# Patient Record
Sex: Male | Born: 1935 | Race: White | Hispanic: No | Smoking: Never smoker
Health system: Southern US, Community
[De-identification: ages and names within clinical notes are randomized; demographics above are authoritative.]

## PROBLEM LIST (undated history)

## (undated) DIAGNOSIS — H3320 Serous retinal detachment, unspecified eye: Secondary | ICD-10-CM

## (undated) DIAGNOSIS — I10 Essential (primary) hypertension: Secondary | ICD-10-CM

## (undated) DIAGNOSIS — K635 Polyp of colon: Secondary | ICD-10-CM

## (undated) DIAGNOSIS — E119 Type 2 diabetes mellitus without complications: Secondary | ICD-10-CM

## (undated) DIAGNOSIS — E785 Hyperlipidemia, unspecified: Secondary | ICD-10-CM

## (undated) DIAGNOSIS — C801 Malignant (primary) neoplasm, unspecified: Secondary | ICD-10-CM

## (undated) DIAGNOSIS — R011 Cardiac murmur, unspecified: Secondary | ICD-10-CM

## (undated) DIAGNOSIS — N189 Chronic kidney disease, unspecified: Secondary | ICD-10-CM

## (undated) HISTORY — DX: Essential (primary) hypertension: I10

## (undated) HISTORY — DX: Serous retinal detachment, unspecified eye: H33.20

## (undated) HISTORY — PX: OTHER SURGICAL HISTORY: SHX169

## (undated) HISTORY — DX: Hyperlipidemia, unspecified: E78.5

## (undated) HISTORY — DX: Cardiac murmur, unspecified: R01.1

## (undated) HISTORY — DX: Polyp of colon: K63.5

## (undated) HISTORY — PX: HERNIA REPAIR: SHX51

## (undated) HISTORY — PX: COLONOSCOPY: SHX174

## (undated) HISTORY — PX: EYE SURGERY: SHX253

---

## 1978-06-10 DIAGNOSIS — I10 Essential (primary) hypertension: Secondary | ICD-10-CM

## 1978-06-10 HISTORY — DX: Essential (primary) hypertension: I10

## 2002-10-22 ENCOUNTER — Encounter: Admission: RE | Admit: 2002-10-22 | Discharge: 2002-10-22 | Payer: Self-pay | Admitting: Nephrology

## 2002-10-22 ENCOUNTER — Encounter: Payer: Self-pay | Admitting: Nephrology

## 2005-02-21 ENCOUNTER — Other Ambulatory Visit: Payer: Self-pay

## 2005-02-21 ENCOUNTER — Ambulatory Visit: Payer: Self-pay | Admitting: General Surgery

## 2005-02-28 ENCOUNTER — Ambulatory Visit: Payer: Self-pay | Admitting: General Surgery

## 2006-06-10 DIAGNOSIS — R011 Cardiac murmur, unspecified: Secondary | ICD-10-CM

## 2006-06-10 HISTORY — DX: Cardiac murmur, unspecified: R01.1

## 2008-01-11 ENCOUNTER — Other Ambulatory Visit: Payer: Self-pay

## 2008-01-11 ENCOUNTER — Inpatient Hospital Stay: Payer: Self-pay | Admitting: Internal Medicine

## 2010-04-24 ENCOUNTER — Ambulatory Visit: Payer: Self-pay | Admitting: General Surgery

## 2010-06-01 ENCOUNTER — Ambulatory Visit: Payer: Self-pay | Admitting: General Surgery

## 2010-06-05 LAB — PATHOLOGY REPORT

## 2011-06-28 ENCOUNTER — Ambulatory Visit: Payer: Self-pay | Admitting: General Surgery

## 2011-06-28 HISTORY — PX: COLONOSCOPY: SHX174

## 2011-07-01 LAB — PATHOLOGY REPORT

## 2012-12-08 ENCOUNTER — Encounter: Payer: Self-pay | Admitting: *Deleted

## 2013-06-17 ENCOUNTER — Ambulatory Visit (INDEPENDENT_AMBULATORY_CARE_PROVIDER_SITE_OTHER): Payer: Medicare Other | Admitting: General Surgery

## 2013-06-17 ENCOUNTER — Encounter: Payer: Self-pay | Admitting: General Surgery

## 2013-06-17 VITALS — BP 132/68 | HR 60 | Resp 12 | Ht 70.0 in | Wt 154.0 lb

## 2013-06-17 DIAGNOSIS — K635 Polyp of colon: Secondary | ICD-10-CM

## 2013-06-17 DIAGNOSIS — D126 Benign neoplasm of colon, unspecified: Secondary | ICD-10-CM

## 2013-06-17 MED ORDER — POLYETHYLENE GLYCOL 3350 17 GM/SCOOP PO POWD: ORAL | Status: DC

## 2013-06-17 NOTE — Progress Notes (Signed)
Patient ID: [name], male   DOB: 1931-06-17, 80 y.o.   MRN: [id number]  Chief Complaint  Patient presents with  . Colon Polyps    HPI [name] is a 80 y.o. male.  Here to discuss having a 2 year follow up colonoscopy. Multiple polyps were identified at that time. Denies any gastrointestinal issues. 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 States he has a little heart/chest pain occasionally when he gets frustrated while caring for his wife, but has no activity limitations.The patient has not experienced any change in his bowel habits. Bowels move about every other day.  HPI  Past Medical History  Diagnosis Date  . Hypertension 1980  . Heart murmur 2008  . Retinal detachment   . Hyperlipidemia     Past Surgical History  Procedure Laterality Date  . Hernia repair    . Eye surgery    . Colonoscopy  2011  . Colonoscopy  06-28-2011    No family history on file.  Social History History  Substance Use Topics  . Smoking status: Never Smoker   . Smokeless tobacco: Never Used  . Alcohol Use: No    Allergies  Allergen Reactions  . Penicillins Rash    Current Outpatient Prescriptions  Medication Sig Dispense Refill  . amLODipine (NORVASC) 10 MG tablet Take 10 mg by mouth daily.       Marland Kitchen aspirin 81 MG tablet Take 81 mg by mouth daily.      Marland Kitchen atorvastatin (LIPITOR) 20 MG tablet Take 20 mg by mouth daily at 6 PM.       . cloNIDine (CATAPRES - DOSED IN MG/24 HR) 0.2 mg/24hr patch Place 0.2 mg onto the skin once a week.       Marland Kitchen GLIPIZIDE XL 2.5 MG 24 hr tablet Take 2.5 mg by mouth daily with breakfast.       . JALYN 0.5-0.4 MG CAPS daily.       Marland Kitchen labetalol (NORMODYNE) 200 MG tablet Take 100 mg by mouth daily.       . Multiple Vitamin (MULTIVITAMIN) capsule Take 1 capsule by mouth daily.      . polyethylene glycol powder (GLYCOLAX/MIRALAX) powder 255 grams one bottle for colonoscopy prep  255 g  0   No current  facility-administered medications for this visit.    Review of Systems Review of Systems  Constitutional: Negative.   Respiratory: Negative.   Cardiovascular: Negative.     Blood pressure 132/68, pulse 60, resp. rate 12, height 5\' 10"  (1.778 m), weight 154 lb (69.854 kg).  Physical Exam Physical Exam  Constitutional: He is oriented to person, place, and time. He appears well-developed and well-nourished.  Cardiovascular: Normal rate, regular rhythm and normal heart sounds.   No lower leg edema.  Pulmonary/Chest: Effort normal and breath sounds normal.  Abdominal: Soft. Normal appearance and bowel sounds are normal. There is no tenderness. No hernia. Hernia confirmed negative in the right inguinal area and confirmed negative in the left inguinal area.  Lymphadenopathy:    He has no cervical adenopathy.  Neurological: He is alert and oriented to person, place, and time.  Skin: Skin is warm and dry.  Bilateral lower leg venous stasis skin changes.    Data Reviewed Pathology from his 06/28/2011 colonoscopy showed a tubular adenoma in the sigmoid colon at 30 cm, tubular adenoma in the transverse colon and to adenoma in the proximal  transverse colon. The sigmoid lesion required fulguration at the base as did the transverse colon lesion. He previously had a tubulovillous adenoma removed from the hepatic flexure measuring 30 mm in diameter at the time of a 06/01/2010 exam.  Assessment    Colonic polyps.    Plan    Colonoscopy with possible biopsy/polypectomy prn: Information regarding the procedure, including its potential risks and complications (including but not limited to perforation of the bowel, which may require emergency surgery to repair, and bleeding) was verbally given to the patient. Educational information regarding lower instestinal endoscopy was given to the patient. Written instructions for how to complete the bowel prep using Miralax were provided. The importance of  drinking ample fluids to avoid dehydration as a result of the prep emphasized.     Patient has been scheduled for a colonoscopy on 06-23-13 at [facility].  [name] 06/18/2013, 3:06 PM

## 2013-06-17 NOTE — Patient Instructions (Addendum)
Colonoscopy A colonoscopy is an exam to evaluate your entire colon. In this exam, your colon is cleansed. A long fiberoptic tube is inserted through your rectum and into your colon. The fiberoptic scope (endoscope) is a long bundle of enclosed and very flexible fibers. These fibers transmit light to the area examined and send images from that area to your caregiver. Discomfort is usually minimal. You may be given a drug to help you sleep (sedative) during or prior to the procedure. This exam helps to detect lumps (tumors), polyps, inflammation, and areas of bleeding. Your caregiver may also take a small piece of tissue (biopsy) that will be examined under a microscope. LET YOUR CAREGIVER KNOW ABOUT:   Allergies to food or medicine.  Medicines taken, including vitamins, herbs, eyedrops, over-the-counter medicines, and creams.  Use of steroids (by mouth or creams).  Previous problems with anesthetics or numbing medicines.  History of bleeding problems or blood clots.  Previous surgery.  Other health problems, including diabetes and kidney problems.  Possibility of pregnancy, if this applies. BEFORE THE PROCEDURE   A clear liquid diet may be required for 2 days before the exam.  Ask your caregiver about changing or stopping your regular medications.  Liquid injections (enemas) or laxatives may be required.  A large amount of electrolyte solution may be given to you to drink over a short period of time. This solution is used to clean out your colon.  You should be present 60 minutes prior to your procedure or as directed by your caregiver. AFTER THE PROCEDURE   If you received a sedative or pain relieving medication, you will need to arrange for someone to drive you home.  Occasionally, there is a little blood passed with the first bowel movement. Do not be concerned. FINDING OUT THE RESULTS OF YOUR TEST Not all test results are available during your visit. If your test results are  not back during the visit, make an appointment with your caregiver to find out the results. Do not assume everything is normal if you have not heard from your caregiver or the medical facility. It is important for you to follow up on all of your test results. HOME CARE INSTRUCTIONS   It is not unusual to pass moderate amounts of gas and experience mild abdominal cramping following the procedure. This is due to air being used to inflate your colon during the exam. Walking or a warm pack on your belly (abdomen) may help.  You may resume all normal meals and activities after sedatives and medicines have worn off.  Only take over-the-counter or prescription medicines for pain, discomfort, or fever as directed by your caregiver. Do not use aspirin or blood thinners if a biopsy was taken. Consult your caregiver for medicine usage if biopsies were taken. SEEK IMMEDIATE MEDICAL CARE IF:   You have a fever.  You pass large blood clots or fill a toilet with blood following the procedure. This may also occur 10 to 14 days following the procedure. This is more likely if a biopsy was taken.  You develop abdominal pain that keeps getting worse and cannot be relieved with medicine. Document Released: 05/24/2000 Document Revised: 08/19/2011 Document Reviewed: 12/28/2012 [facility] Patient Information 2014 [place].  Patient has been scheduled for a colonoscopy on 06-23-13 at [facility].

## 2013-06-18 ENCOUNTER — Other Ambulatory Visit: Payer: Self-pay | Admitting: General Surgery

## 2013-06-18 DIAGNOSIS — K635 Polyp of colon: Secondary | ICD-10-CM

## 2013-06-21 ENCOUNTER — Telehealth: Payer: Self-pay | Admitting: *Deleted

## 2013-06-21 NOTE — Telephone Encounter (Signed)
Patient notified as instructed to hold glipizide day of colonoscopy prep and procedure. He verbalizes understanding.  This patient is currently scheduled for a colonoscopy on 06-23-13 at [facility]

## 2013-06-23 ENCOUNTER — Ambulatory Visit: Payer: Self-pay | Admitting: General Surgery

## 2013-06-23 DIAGNOSIS — D129 Benign neoplasm of anus and anal canal: Secondary | ICD-10-CM

## 2013-06-23 DIAGNOSIS — D128 Benign neoplasm of rectum: Secondary | ICD-10-CM

## 2013-06-23 HISTORY — PX: POLYPECTOMY: SHX149

## 2013-06-25 ENCOUNTER — Encounter: Payer: Self-pay | Admitting: General Surgery

## 2013-06-25 LAB — PATHOLOGY REPORT

## 2013-06-28 ENCOUNTER — Encounter: Payer: Self-pay | Admitting: General Surgery

## 2013-06-28 ENCOUNTER — Ambulatory Visit (INDEPENDENT_AMBULATORY_CARE_PROVIDER_SITE_OTHER): Payer: Self-pay | Admitting: General Surgery

## 2013-06-28 VITALS — BP 126/64 | HR 68 | Resp 12 | Ht 70.0 in | Wt 154.0 lb

## 2013-06-28 DIAGNOSIS — D126 Benign neoplasm of colon, unspecified: Secondary | ICD-10-CM

## 2013-06-28 DIAGNOSIS — K635 Polyp of colon: Secondary | ICD-10-CM

## 2013-06-28 NOTE — Progress Notes (Signed)
Patient ID: [name], male   DOB: [birth year], 80 y.o.   MRN: [id number]  Chief Complaint  Patient presents with  . Routine Post Op    colonoscopy    HPI [name] is a 80 y.o. male here today for his post op colonoscopy which was done on 06/23/13. Patient states he is doing well . The procedure was completed due to prior incomplete resections of a polyp on the inner curve of the hepatic flexure. The patient tolerated the procedure well. HPI  Past Medical History  Diagnosis Date  . Hypertension 1980  . Heart murmur 2008  . Retinal detachment   . Hyperlipidemia     Past Surgical History  Procedure Laterality Date  . Hernia repair    . Eye surgery    . Colonoscopy  2011,06/23/13  . Colonoscopy  06-28-2011    No family history on file.  Social History History  Substance Use Topics  . Smoking status: Never Smoker   . Smokeless tobacco: Never Used  . Alcohol Use: No    Allergies  Allergen Reactions  . Penicillins Rash    Current Outpatient Prescriptions  Medication Sig Dispense Refill  . amLODipine (NORVASC) 10 MG tablet Take 10 mg by mouth daily.       Marland Kitchen aspirin 81 MG tablet Take 81 mg by mouth daily.      Marland Kitchen atorvastatin (LIPITOR) 20 MG tablet Take 20 mg by mouth daily at 6 PM.       . cloNIDine (CATAPRES - DOSED IN MG/24 HR) 0.2 mg/24hr patch Place 0.2 mg onto the skin once a week.       Marland Kitchen GLIPIZIDE XL 2.5 MG 24 hr tablet Take 2.5 mg by mouth daily with breakfast.       . JALYN 0.5-0.4 MG CAPS daily.       Marland Kitchen labetalol (NORMODYNE) 200 MG tablet Take 100 mg by mouth daily.       . Multiple Vitamin (MULTIVITAMIN) capsule Take 1 capsule by mouth daily.      . polyethylene glycol powder (GLYCOLAX/MIRALAX) powder 255 grams one bottle for colonoscopy prep  255 g  0   No current facility-administered medications for this visit.    Review of Systems Review of Systems  Respiratory: Negative.   Cardiovascular: Negative.     Blood pressure 126/64, pulse 68, resp. rate  12, height 5\' 10"  (1.778 m), weight 154 lb (69.854 kg).  Physical Exam Physical Exam  Data Reviewed Pathology of the ascending colon polyp showed a tubular adenoma. The hepatic flexure polyp showed a tubulovillous adenoma (similar to that reported in 2011) without evidence of high-grade dysplasia or malignancy.  Assessment    Tubular villous adenoma of the hepatic flexure, partially resected.    Plan    Options for management were reviewed: 1) formal right hemicolectomy versus 2) repeat colonoscopy in 2 years. At this time the patient's comfortable with repeat exam in 2 years. He'll notify the office if there is any change in his clinical condition in the interval or if he develops any GI symptoms.       [name] 06/29/2013, 4:13 PM

## 2013-06-28 NOTE — Patient Instructions (Signed)
Patient to return in two years for colonoscopy.

## 2013-06-30 ENCOUNTER — Encounter: Payer: Self-pay | Admitting: General Surgery

## 2013-07-01 ENCOUNTER — Telehealth: Payer: Self-pay | Admitting: *Deleted

## 2013-07-01 ENCOUNTER — Encounter: Payer: Self-pay | Admitting: *Deleted

## 2013-07-01 ENCOUNTER — Inpatient Hospital Stay: Payer: Self-pay | Admitting: Internal Medicine

## 2013-07-01 LAB — CBC
HCT: 31.9 % — AB (ref 40.0–52.0)
HGB: 10.8 g/dL — ABNORMAL LOW (ref 13.0–18.0)
MCH: 32 pg (ref 26.0–34.0)
MCHC: 34 g/dL (ref 32.0–36.0)
MCV: 94 fL (ref 80–100)
PLATELETS: 160 10*3/uL (ref 150–440)
RBC: 3.39 10*6/uL — AB (ref 4.40–5.90)
RDW: 12.6 % (ref 11.5–14.5)
WBC: 7.1 10*3/uL (ref 3.8–10.6)

## 2013-07-01 LAB — COMPREHENSIVE METABOLIC PANEL
ALBUMIN: 3.8 g/dL (ref 3.4–5.0)
ANION GAP: 4 — AB (ref 7–16)
Alkaline Phosphatase: 55 U/L
BUN: 45 mg/dL — ABNORMAL HIGH (ref 7–18)
Bilirubin,Total: 0.3 mg/dL (ref 0.2–1.0)
CHLORIDE: 102 mmol/L (ref 98–107)
CREATININE: 1.89 mg/dL — AB (ref 0.60–1.30)
Calcium, Total: 8.5 mg/dL (ref 8.5–10.1)
Co2: 27 mmol/L (ref 21–32)
EGFR (African American): 38 — ABNORMAL LOW
EGFR (Non-African Amer.): 33 — ABNORMAL LOW
Glucose: 137 mg/dL — ABNORMAL HIGH (ref 65–99)
Osmolality: 280 (ref 275–301)
POTASSIUM: 4.5 mmol/L (ref 3.5–5.1)
SGOT(AST): 30 U/L (ref 15–37)
SGPT (ALT): 32 U/L (ref 12–78)
Sodium: 133 mmol/L — ABNORMAL LOW (ref 136–145)
Total Protein: 6.8 g/dL (ref 6.4–8.2)

## 2013-07-01 LAB — PROTIME-INR
INR: 1.1
PROTHROMBIN TIME: 13.6 s (ref 11.5–14.7)

## 2013-07-01 LAB — APTT: Activated PTT: 28.4 secs (ref 23.6–35.9)

## 2013-07-01 NOTE — Telephone Encounter (Signed)
Pt called to say he had two bloody stools today with odor.  One this morning and one at lunch, both loose.  No bloody BM's since colonoscopy 06-23-13 til today. Denies any other symptoms. States he has appt with Dr Eliberto Ivory today, he came by to pick up stool cards to completed I told him I would inform Dr Bary Castilla and we would be in touch as well.

## 2013-07-01 NOTE — Telephone Encounter (Signed)
Phone call from [name] after his appt with Dr Yves Dill, he states he did an exam and it had blood on it. Dr. Bary Castilla made aware of phone calls. Advised to monitor for further episodes of bleeding ( he is post colonoscopy with polypectomy).  If he has more bleeding of significant amount during the night or new symptoms he is to go to the ED. He is to call our office in the morning with a status up date, pt agrees.

## 2013-07-02 LAB — BASIC METABOLIC PANEL
Anion Gap: 5 — ABNORMAL LOW (ref 7–16)
BUN: 43 mg/dL — AB (ref 7–18)
CO2: 24 mmol/L (ref 21–32)
Calcium, Total: 7.8 mg/dL — ABNORMAL LOW (ref 8.5–10.1)
Chloride: 107 mmol/L (ref 98–107)
Creatinine: 1.74 mg/dL — ABNORMAL HIGH (ref 0.60–1.30)
EGFR (Non-African Amer.): 36 — ABNORMAL LOW
GFR CALC AF AMER: 42 — AB
Glucose: 166 mg/dL — ABNORMAL HIGH (ref 65–99)
Osmolality: 287 (ref 275–301)
POTASSIUM: 4.3 mmol/L (ref 3.5–5.1)
SODIUM: 136 mmol/L (ref 136–145)

## 2013-07-02 LAB — HEMOGLOBIN: HGB: 9.4 g/dL — ABNORMAL LOW (ref 13.0–18.0)

## 2013-07-02 LAB — CBC WITH DIFFERENTIAL/PLATELET
BASOS ABS: 0 10*3/uL (ref 0.0–0.1)
BASOS PCT: 0.4 %
Basophil #: 0 10*3/uL (ref 0.0–0.1)
Basophil %: 0.3 %
EOS ABS: 0.2 10*3/uL (ref 0.0–0.7)
EOS ABS: 0.1 10*3/uL (ref 0.0–0.7)
Eosinophil %: 2.4 %
Eosinophil %: 1.2 %
HCT: 24 % — ABNORMAL LOW (ref 40.0–52.0)
HCT: 21.9 % — AB (ref 40.0–52.0)
HGB: 8.1 g/dL — ABNORMAL LOW (ref 13.0–18.0)
HGB: 7.6 g/dL — ABNORMAL LOW (ref 13.0–18.0)
LYMPHS PCT: 8.8 %
Lymphocyte #: 1.7 10*3/uL (ref 1.0–3.6)
Lymphocyte #: 0.9 10*3/uL — ABNORMAL LOW (ref 1.0–3.6)
Lymphocyte %: 19.1 %
MCH: 31.8 pg (ref 26.0–34.0)
MCH: 32.4 pg (ref 26.0–34.0)
MCHC: 33.8 g/dL (ref 32.0–36.0)
MCHC: 34.8 g/dL (ref 32.0–36.0)
MCV: 94 fL (ref 80–100)
MCV: 93 fL (ref 80–100)
MONO ABS: 0.5 x10 3/mm (ref 0.2–1.0)
Monocyte #: 0.7 x10 3/mm (ref 0.2–1.0)
Monocyte %: 8.6 %
Monocyte %: 5.6 %
NEUTROS ABS: 8.2 10*3/uL — AB (ref 1.4–6.5)
Neutrophil #: 6.1 10*3/uL (ref 1.4–6.5)
Neutrophil %: 69.5 %
Neutrophil %: 84.1 %
Platelet: 141 10*3/uL — ABNORMAL LOW (ref 150–440)
Platelet: 122 10*3/uL — ABNORMAL LOW (ref 150–440)
RBC: 2.56 10*6/uL — AB (ref 4.40–5.90)
RBC: 2.35 10*6/uL — AB (ref 4.40–5.90)
RDW: 12.5 % (ref 11.5–14.5)
RDW: 12.7 % (ref 11.5–14.5)
WBC: 8.8 10*3/uL (ref 3.8–10.6)
WBC: 9.8 10*3/uL (ref 3.8–10.6)

## 2013-07-05 ENCOUNTER — Telehealth: Payer: Self-pay

## 2013-07-05 NOTE — Telephone Encounter (Signed)
Message copied by Lesly Rubenstein on Mon Jul 05, 2013  9:43 AM ------      Message from: [name]      Created: Sat Jul 03, 2013  2:14 PM       Please contact Monday, 1/26 AM to arrange for brief OV on Wednesday. Hospitalized 1/22-1/24 with lower GI bleeding. He will need to go to the lab for a HGB that day. Thanks. ------

## 2013-07-05 NOTE — Telephone Encounter (Signed)
Spoke with patient about follow up appointment. Patient has an appointment to be seen on 07/07/13 at 2:45 pm with Dr Bary Castilla. Patient states that he is doing well.

## 2013-07-07 ENCOUNTER — Ambulatory Visit (INDEPENDENT_AMBULATORY_CARE_PROVIDER_SITE_OTHER): Payer: Self-pay | Admitting: General Surgery

## 2013-07-07 ENCOUNTER — Encounter: Payer: Self-pay | Admitting: General Surgery

## 2013-07-07 VITALS — BP 140/60 | HR 76 | Resp 12 | Ht 70.0 in | Wt 153.0 lb

## 2013-07-07 DIAGNOSIS — K625 Hemorrhage of anus and rectum: Secondary | ICD-10-CM

## 2013-07-07 DIAGNOSIS — IMO0002 Reserved for concepts with insufficient information to code with codable children: Secondary | ICD-10-CM | POA: Insufficient documentation

## 2013-07-07 NOTE — Patient Instructions (Signed)
The patient is aware to call back for any questions or concerns.  

## 2013-07-07 NOTE — Progress Notes (Signed)
Patient ID: [name], male   DOB: 02-Nov-1931, 80 y.o.   MRN: [id number]  Chief Complaint  Patient presents with  . Follow-up    HPI [name] is a 80 y.o. male.  Here for follow up from GI bleed and post colonoscopy/polypectomy done 06-23-13. Received one unit blood while at [facility].  Bowel movement this morning was dark on the ends but no blood noted. 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 HGB at [facility] was 9.7, Iron 325 mg twice a day ordered but he has not picked it up.  The patient underwent a colonoscopy on June 23, 2013 at which time partial resection of a long-standing polyp at the hepatic flexure was undertaken. On postoperative day 8 he reported black stools and was subsequently admitted to [facility] will follow his hemoglobin to below 8. He was transfused one unit PRBC with a significant rise in his hemoglobin over 9. His first bowel blue was yesterday and he reported that the distal portion of the stool was a normal brown color. He continues to feel well. No bowel pain or discomfort. No dietary tolerance.  HPI  Past Medical History  Diagnosis Date  . Hypertension 1980  . Heart murmur 2008  . Retinal detachment   . Hyperlipidemia     Past Surgical History  Procedure Laterality Date  . Hernia repair    . Eye surgery    . Colonoscopy  2011,06/23/13  . Colonoscopy  06-28-2011  . ]    . Polypectomy  06-23-13    No family history on file.  Social History History  Substance Use Topics  . Smoking status: Never Smoker   . Smokeless tobacco: Never Used  . Alcohol Use: No    Allergies  Allergen Reactions  . Penicillins Rash    Current Outpatient Prescriptions  Medication Sig Dispense Refill  . amLODipine (NORVASC) 10 MG tablet Take 10 mg by mouth daily.       Marland Kitchen atorvastatin (LIPITOR) 20 MG tablet Take 20 mg by mouth daily at 6 PM.       . cloNIDine (CATAPRES - DOSED IN MG/24 HR) 0.2 mg/24hr patch Place 0.2 mg onto the skin once a week.       Marland Kitchen  GLIPIZIDE XL 2.5 MG 24 hr tablet Take 2.5 mg by mouth daily with breakfast.       . JALYN 0.5-0.4 MG CAPS daily.       Marland Kitchen labetalol (NORMODYNE) 200 MG tablet Take 100 mg by mouth daily.       . Multiple Vitamin (MULTIVITAMIN) capsule Take 1 capsule by mouth daily.       No current facility-administered medications for this visit.    Review of Systems Review of Systems  Constitutional: Negative.   Respiratory: Negative.   Cardiovascular: Negative.   Gastrointestinal: Negative for nausea, diarrhea and constipation.  Neurological: Negative for dizziness and light-headedness.    Blood pressure 140/60, pulse 76, resp. rate 12, height 5\' 10"  (1.778 m), weight 153 lb (69.4 kg).  Physical Exam Physical Exam  Constitutional: He is oriented to person, place, and time. He appears well-developed and well-nourished.  Neurological: He is alert and oriented to person, place, and time.  Skin: Skin is warm and dry.    Data Reviewed Hemoglobin completely yesterday and his primary care office was reported 9.7. This is up from hospital discharge.  Assessment    Postop ectopy bleeding, resolved.     Plan  Assuming no further issues we'll plan for a followup examination in 2 years. He was encouraged to call if he experiences further black stools. He was advised with the initiation of iron therapy he will notice the stools becoming darker in color.        [name] 07/07/2013, 7:46 PM

## 2014-10-01 NOTE — Consult Note (Signed)
Pt of Dr. Bary Castilla with hx of colon polyps. Has had colonoscopy in 2011, 2013, and 06/23/2013 with large hepatic flexure polyp that was not completely removed. Pt with diarrhea and bleeding with significant drop in hgb. Diarrhea prob related to polypectomy bleeding. I have already talked to Dr. Bary Castilla who knows the patient. Dr.Byrnett will see the patient later today. Thanks.   Electronic Signatures: Verdie Shire (MD) (Signed on 23-Jan-15 10:00)  Authored   Last Updated: 23-Jan-15 10:02 by Verdie Shire (MD)

## 2014-10-01 NOTE — H&P (Signed)
PATIENT NAME:  ARYO, ASMAN MR#:  [id number] DATE OF BIRTH:  03/12/1932  DATE OF ADMISSION:  07/01/2013  PRIMARY CARE PHYSICIAN:  Dr. Elberta Fortis .   REFERRING PHYSICIAN:  Dr. Mariea Clonts.   CHIEF COMPLAINT:  Diarrhea, bright red blood per rectum.   HISTORY OF PRESENT ILLNESS:  Mr. Summar is a 81 year old, pleasant, white male with past medical history of diabetes mellitus, hypertension, chronic renal insufficiency, presented to the Emergency Department with complaints of diarrhea. The patient had colonoscopy 8 days back by Dr. Bary Castilla, had polypectomy done. Two days after the procedure, the patient started to have diarrhea. Yesterday, had multiple episodes of foul-smelling diarrhea. Since this morning, started to have bright red blood per rectum. Had 4 episodes prior to coming to the Emergency Department. The patient continued to experience severe generalized weakness. Concerning this, came to the Emergency Department. Denied having any abdominal pain, fever, nausea, vomiting. Workup in the Emergency Department, the patient was noted to have mild elevation of the BUN and creatinine, and a hemoglobin of 10.8. No previous hemoglobin to compare.   PAST MEDICAL HISTORY:  1.  Hypertension.  2.  Hyperlipidemia.  3.  Diabetes mellitus.   ALLERGIES:  PENICILLIN and DEMEROL.    HOME MEDICATIONS:  1.  Multivitamin 1 tablet daily.  2.  Lipitor 20 mg daily.  3.  Labetalol 100 mg 2 times a day.  4.   1 capsule once a day.  5.  Glipizide 2.5 mg once a day.  6.  Clonidine 0.2 mg patch transdermal once a week.  7.  Aspirin 81 mg daily.  8.  Amlodipine 10 mg once a day.   SOCIAL HISTORY:  No history of smoking, drinking alcohol, or using illicit drugs. Married, lives with his wife, he is the primary caregiver of his wife.   FAMILY HISTORY:  Both parents lived to 67, died of old age.   REVIEW OF SYSTEMS:  CONSTITUTIONAL:  Generalized weakness.  EYES:  No change in vision.  ENT:  No change in hearing. No sore  throat.  RESPIRATORY:  Denies any cough, shortness of breath.  CARDIOVASCULAR:  No chest pain, palpitations.  GASTROINTESTINAL:  Has diarrhea and bright red blood per rectum. No abdominal pain.  GENITOURINARY:  No dysuria or hematuria.  ENDOCRINE:  No polyuria or polydipsia. Has diagnosis of diabetes mellitus.  HEMATOLOGY:  No easy bruising or bleeding.  SKIN:  No rash or lesions.  MUSCULOSKELETAL:  No joint pains or aches.  NEUROLOGIC:  No weakness or numbness in any part of the body.  PSYCHIATRIC:  No depression.   PHYSICAL EXAMINATION:  GENERAL:  This is a well-built, well-nourished, age-appropriate male lying down in the bed, not in distress.  VITAL SIGNS:  Temperature 98.1, pulse 70, blood pressure 175/78, respiratory rate of 16, oxygen saturation is 98% on room air.  HEENT:  Head normocephalic, atraumatic. There is no scleral icterus. Conjunctivae normal. Pupils equal and reactive to light. Extraocular movements are intact. Mucous membranes moist. No pharyngeal erythema.  NECK:  Supple. No lymphadenopathy. No JVD. No carotid bruit. No thyromegaly.  CHEST:  No focal tenderness.  LUNGS:  Bilaterally clear to auscultation.  HEART:  S1, S2 regular. No murmurs are heard.  ABDOMEN:  Increased bowel sounds, hyperactive. Mild tenderness, diffuse. No guarding or rebound tenderness. Could not appreciate any hepatosplenomegaly.  EXTREMITIES:  No pedal edema. Pulses 2+.  SKIN:  No rash or lesions.  MUSCULOSKELETAL:  Good range of motion in all extremities.  NEUROLOGIC:  The patient is alert; oriented to place, person, and time. Cranial nerves II through XII intact. Motor 5/5 in upper and lower extremities.   LABORATORIES:   1.  Coag profile is well within normal limits.  2.  CMP: BUN 45, creatinine of 1.89.  3.  CBC: WBC 7.1, hemoglobin 10.8.   ASSESSMENT AND PLAN:  Mr. Benoit is a 81 year old male, who comes to the Emergency Department with diarrhea and bright red blood per rectum.  1.   Colitis. We will check a Clostridium difficile toxin. Keep the patient on Cipro and Flagyl.  2.  Bright red blood per rectum. Again, this could be from the polypectomy site or from the colitis. We will consult gastroenterology in the morning.  3.  Hypertension. Continue his home medications. Hold the labetalol for now.  4.  Keep the patient on deep vein thrombosis prophylaxis with sequential compression devices.  5.  Acute renal failure secondary to dehydration. Continue with IV fluids and followup.  6.  Anemia. We do not have the patient's baseline. We will continue to follow up. We will transfuse if hemoglobin less than 8.   TIME SPENT:  50 minutes.    ____________________________ Monica Becton, MD pv:ms D: 07/01/2013 21:33:28 ET T: 07/01/2013 21:55:32 ET JOB#: [id number]  cc: Monica Becton, MD, <Dictator> Monica Becton MD ELECTRONICALLY SIGNED 07/04/2013 21:25

## 2014-10-01 NOTE — Discharge Summary (Signed)
PATIENT NAME:  [name], [name] MR#:  [id number] DATE OF BIRTH:  1932/04/11  DATE OF ADMISSION:  07/01/2013  DATE OF DISCHARGE:  07/03/2013  PRESENTING COMPLAINT: Rectal bleed.   DISCHARGE DIAGNOSES: 1.  Lower gastrointestinal bleed suspected due to recent polypectomy at colonoscopy, resolved.  2.  Hypertension.  3.  Type 2 diabetes.   CODE STATUS:  FULL CODE.   MEDICATIONS: 1.  Glipizide 2.5 mg p.o. daily.  2.  Labetalol 200 mg 1/2 tablet b.i.d.  3.  Amlodipine 10 mg daily.  4.  Lipitor 20 mg at bedtime.  5.  Clonidine 0.2 mg 1 patch once a week on Thursday.  6.  Jalyn 0.5/0.4, 1 capsule at bedtime.  7.  Multivitamin p.o. daily.  INSTRUCTIONS:  Mechanical soft, low sodium, carbohydrate-controlled diet. Follow up with Dr. Bary Castilla as outpatient in 3 to 4 days.   CONSULTATIONS: 1.  Dr. Bary Castilla, Surgery.  2.  Gastrointestinal consultation, Dr. Candace Cruise.   BRIEF SUMMARY OF HOSPITAL COURSE: [name] is a 81 year old Caucasian male admitted with history of hypertension and diabetes. Came in to the Emergency Room with diarrhea and bright red blood per rectum. He was admitted with:   1.  Lower GI bleed/rectal bleed. The patient had a colonoscopy with polypectomy done about 8 days ago with Dr. Bary Castilla. Two days after the procedure, he started having diarrhea. He also started noticing some bright red blood per rectum. His hemoglobin was 10.8, went down to 7.6. Received 1 unit of blood transfusion. The patient was seen by Dr. Candace Cruise and Dr. Bary Castilla, who recommended continued observation and hemoglobin monitoring. The patient's diarrhea resolved. He was kept on a clear liquid diet, which he tolerated well. He received IV fluids as well. Empirically, Flagyl and Cipro were started, which were discontinued, given patient being asymptomatic, no fever, and normal white count. The patient's hemoglobin at discharge was 9.4. He will follow up with Dr. Bary Castilla as outpatient.   2.  Hypertension. Home meds were  resumed. Blood pressure remained stable.   3.  Type 2 diabetes. The patient was asked to resume back his glipizide as before.   Hospital stay otherwise remained stable. The patient remained a FULL CODE.    TIME SPENT: 40 minutes.   ____________________________ Hart Rochester Posey Pronto, MD sap:mr D: 07/04/2013 07:08:47 ET T: 07/04/2013 16:25:37 ET JOB#: [id number]  cc: Ludger Bones A. Posey Pronto, MD, <Dictator> [name], MD Lupita Dawn. Candace Cruise, MD   Ilda Basset MD ELECTRONICALLY SIGNED 07/05/2013 15:05

## 2015-05-10 ENCOUNTER — Encounter: Payer: Self-pay | Admitting: *Deleted

## 2015-06-19 ENCOUNTER — Ambulatory Visit: Payer: Medicare Other | Admitting: General Surgery

## 2015-07-13 ENCOUNTER — Telehealth: Payer: Self-pay | Admitting: *Deleted

## 2015-07-13 ENCOUNTER — Encounter: Payer: Self-pay | Admitting: General Surgery

## 2015-07-13 ENCOUNTER — Ambulatory Visit (INDEPENDENT_AMBULATORY_CARE_PROVIDER_SITE_OTHER): Payer: Medicare Other | Admitting: General Surgery

## 2015-07-13 VITALS — BP 162/68 | HR 64 | Resp 14 | Ht 69.0 in | Wt 154.0 lb

## 2015-07-13 DIAGNOSIS — Z86010 Personal history of colonic polyps: Secondary | ICD-10-CM | POA: Diagnosis not present

## 2015-07-13 MED ORDER — POLYETHYLENE GLYCOL 3350 17 GM/SCOOP PO POWD: ORAL | Status: DC

## 2015-07-13 NOTE — Patient Instructions (Addendum)
Colonoscopy A colonoscopy is an exam to look at the entire large intestine (colon). This exam can help find problems such as tumors, polyps, inflammation, and areas of bleeding. The exam takes about 1 hour.  LET [facility] CARE PROVIDER KNOW ABOUT:   Any allergies you have.  All medicines you are taking, including vitamins, herbs, eye drops, creams, and over-the-counter medicines.  Previous problems you or members of your family have had with the use of anesthetics.  Any blood disorders you have.  Previous surgeries you have had.  Medical conditions you have. RISKS AND COMPLICATIONS  Generally, this is a safe procedure. However, as with any procedure, complications can occur. Possible complications include:  Bleeding.  Tearing or rupture of the colon wall.  Reaction to medicines given during the exam.  Infection (rare). BEFORE THE PROCEDURE   Ask your health care provider about changing or stopping your regular medicines.  You may be prescribed an oral bowel prep. This involves drinking a large amount of medicated liquid, starting the day before your procedure. The liquid will cause you to have multiple loose stools until your stool is almost clear or light green. This cleans out your colon in preparation for the procedure.  Do not eat or drink anything else once you have started the bowel prep, unless your health care provider tells you it is safe to do so.  Arrange for someone to drive you home after the procedure. PROCEDURE   You will be given medicine to help you relax (sedative).  You will lie on your side with your knees bent.  A long, flexible tube with a light and camera on the end (colonoscope) will be inserted through the rectum and into the colon. The camera sends video back to a computer screen as it moves through the colon. The colonoscope also releases carbon dioxide gas to inflate the colon. This helps your health care provider see the area better.  During  the exam, your health care provider may take a small tissue sample (biopsy) to be examined under a microscope if any abnormalities are found.  The exam is finished when the entire colon has been viewed. AFTER THE PROCEDURE   Do not drive for 24 hours after the exam.  You may have a small amount of blood in your stool.  You may pass moderate amounts of gas and have mild abdominal cramping or bloating. This is caused by the gas used to inflate your colon during the exam.  Ask when your test results will be ready and how you will get your results. Make sure you get your test results.   This information is not intended to replace advice given to you by your health care provider. Make sure you discuss any questions you have with your health care provider.   Document Released: 05/24/2000 Document Revised: 03/17/2013 Document Reviewed: 02/01/2013 Elsevier Interactive Patient Education [employer].  Patient has been scheduled for a colonoscopy on 08-30-15 at [facility]. This patient will hold glipizide day of colonoscopy prep and procedure.

## 2015-07-13 NOTE — Progress Notes (Signed)
Patient ID: [name], male   DOB: Sep 30, 1931, 82 y.o.   MRN: [id number]  Chief Complaint  Patient presents with  . Follow-up    colonoscopy    HPI LIPA GLEW is a 82 y.o. male. here today for his 2 year follow up colonoscopy evaluation. Last colonoscopy was done 06/23/13. No GI problems at this time. His bowels are regular and every 2 days. He does on occasion have diarrhea for one day if he eats too much.   The patient's general health remains excellent. He has taken over cooking detail at home as his wife is now confined to wheelchair.  I personally reviewed the patient's history.   HPI  Past Medical History  Diagnosis Date  . Hypertension 1980  . Heart murmur 2008  . Retinal detachment   . Hyperlipidemia   . Colon polyp     Past Surgical History  Procedure Laterality Date  . Hernia repair    . Eye surgery    . Colonoscopy  2011,06/23/13    Dr Bary Castilla  . Colonoscopy  06-28-2011  . ]    . Polypectomy  06-23-13    No family history on file.  Social History Social History  Substance Use Topics  . Smoking status: Never Smoker   . Smokeless tobacco: Never Used  . Alcohol Use: No    Allergies  Allergen Reactions  . Penicillins Rash    Current Outpatient Prescriptions  Medication Sig Dispense Refill  . amLODipine (NORVASC) 10 MG tablet Take 10 mg by mouth daily.     . cloNIDine (CATAPRES - DOSED IN MG/24 HR) 0.2 mg/24hr patch Place 0.2 mg onto the skin once a week.     Marland Kitchen GLIPIZIDE XL 2.5 MG 24 hr tablet Take 2.5 mg by mouth daily with breakfast.     . JALYN 0.5-0.4 MG CAPS daily.     Marland Kitchen labetalol (NORMODYNE) 100 MG tablet     . Multiple Vitamin (MULTIVITAMIN) capsule Take 1 capsule by mouth daily.    . polyethylene glycol powder (GLYCOLAX/MIRALAX) powder 255 grams one bottle for colonoscopy prep 255 g 0   No current facility-administered medications for this visit.    Review of Systems Review of Systems  Constitutional: Negative.   Respiratory: Negative.    Cardiovascular: Negative.   Gastrointestinal: Positive for diarrhea. Negative for nausea, vomiting, constipation and blood in stool.    Blood pressure 162/68, pulse 64, resp. rate 14, height 5\' 9"  (1.753 m), weight 154 lb (69.854 kg).  Physical Exam Physical Exam  Constitutional: He is oriented to person, place, and time. He appears well-developed and well-nourished.  HENT:  Mouth/Throat: Oropharynx is clear and moist.  Eyes: Conjunctivae are normal. No scleral icterus.  Neck: Neck supple.  Cardiovascular: Normal rate and regular rhythm.   Murmur heard.  Systolic murmur is present with a grade of 2/6  Pulmonary/Chest: Effort normal and breath sounds normal.  Lymphadenopathy:    He has no cervical adenopathy.  Neurological: He is alert and oriented to person, place, and time.  Skin: Skin is warm and dry.  Psychiatric: His behavior is normal.    Data Reviewed PATHOLOGY REPORT  Pathology Report  .                [  Final Report     ]           Material submitted:                    .  PART A: ASCENDING COLON POLYP COLD BIOPSY  PART B: HEPATIC FLEXURE POLYP HOT SNARE  .                [  Final Report     ]           Pre-operative diagnosis:                    .  HX OF COLON POLYPS, COLONSCOPY  .                [  Final Report     ]            Diagnosis:  Part A: ASCENDING COLON POLYP COLD BIOPSY:  - TUBULAR ADENOMA.  - NEGATIVE FOR HIGH GRADE DYSPLASIA AND MALIGNANCY.  .  Part B: HEPATIC FLEXURE POLYP HOT SNARE:  - TUBULOVILLOUS ADENOMA (MULTIPLE FRAGMENTS), SEE COMMENT.  - NEGATIVE FOR HIGH GRADE DYSPLASIA AND MALIGNANCY.  Marland Kitchen  COMMENT: Intradepartmental consultation was obtained.  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    Plan       Colonoscopy with possible biopsy/polypectomy  prn: Information regarding the procedure, including its potential risks and complications (including but not limited to perforation of the bowel, which may require emergency surgery to repair, and bleeding) was verbally given to the patient. Educational information regarding lower intestinal endoscopy was given to the patient. Written instructions for how to complete the bowel prep using Miralax were provided. The importance of drinking ample fluids to avoid dehydration as a result of the prep emphasized.  Patient has been scheduled for a colonoscopy on 08-30-15 at [facility]. This patient will hold glipizide day of colonoscopy prep and procedure.   PCP:  Placido Sou This information has been scribed by Karie Fetch Willard.   [name] 07/14/2015, 7:34 AM

## 2015-07-13 NOTE — Telephone Encounter (Signed)
Message for patient to call the office.   Patient will need to hold glipizide day of colonoscopy prep. It is okay for patient to continue Jalyn day of colonoscopy prep.

## 2015-07-13 NOTE — Telephone Encounter (Signed)
Patient called back and was notified as instructed. He verbalizes understanding.

## 2015-07-14 DIAGNOSIS — Z86010 Personal history of colonic polyps: Secondary | ICD-10-CM | POA: Insufficient documentation

## 2015-08-23 ENCOUNTER — Telehealth: Payer: Self-pay | Admitting: *Deleted

## 2015-08-23 NOTE — Telephone Encounter (Signed)
Patient was contacted today and confirms no medication changes since last office visit. This patient also reports he has picked up Miralax prescription.   We will proceed with colonoscopy as scheduled for 08-30-15 at [facility].   This patient was instructed to call the office should he have further questions.

## 2015-08-30 ENCOUNTER — Ambulatory Visit: Payer: Medicare Other | Admitting: Registered Nurse

## 2015-08-30 ENCOUNTER — Encounter
Admission: RE | Disposition: A | Discharge status: Home / Self Care | Payer: Self-pay | Source: Ambulatory Visit | Attending: General Surgery

## 2015-08-30 ENCOUNTER — Ambulatory Visit
Admission: RE | Admit: 2015-08-30 | Discharge: 2015-08-30 | Disposition: A | Discharge status: Home / Self Care | Payer: Medicare Other | Source: Ambulatory Visit | Attending: General Surgery | Admitting: General Surgery

## 2015-08-30 DIAGNOSIS — Z7984 Long term (current) use of oral hypoglycemic drugs: Secondary | ICD-10-CM | POA: Diagnosis not present

## 2015-08-30 DIAGNOSIS — I129 Hypertensive chronic kidney disease with stage 1 through stage 4 chronic kidney disease, or unspecified chronic kidney disease: Secondary | ICD-10-CM | POA: Diagnosis not present

## 2015-08-30 DIAGNOSIS — E785 Hyperlipidemia, unspecified: Secondary | ICD-10-CM | POA: Diagnosis not present

## 2015-08-30 DIAGNOSIS — Z880 Allergy status to penicillin: Secondary | ICD-10-CM | POA: Diagnosis not present

## 2015-08-30 DIAGNOSIS — D122 Benign neoplasm of ascending colon: Secondary | ICD-10-CM | POA: Insufficient documentation

## 2015-08-30 DIAGNOSIS — Z1211 Encounter for screening for malignant neoplasm of colon: Secondary | ICD-10-CM | POA: Insufficient documentation

## 2015-08-30 DIAGNOSIS — R011 Cardiac murmur, unspecified: Secondary | ICD-10-CM | POA: Insufficient documentation

## 2015-08-30 DIAGNOSIS — Z86010 Personal history of colonic polyps: Secondary | ICD-10-CM | POA: Insufficient documentation

## 2015-08-30 DIAGNOSIS — Z9889 Other specified postprocedural states: Secondary | ICD-10-CM | POA: Diagnosis not present

## 2015-08-30 DIAGNOSIS — E1122 Type 2 diabetes mellitus with diabetic chronic kidney disease: Secondary | ICD-10-CM | POA: Diagnosis not present

## 2015-08-30 DIAGNOSIS — D123 Benign neoplasm of transverse colon: Secondary | ICD-10-CM | POA: Insufficient documentation

## 2015-08-30 DIAGNOSIS — N189 Chronic kidney disease, unspecified: Secondary | ICD-10-CM | POA: Diagnosis not present

## 2015-08-30 DIAGNOSIS — K5730 Diverticulosis of large intestine without perforation or abscess without bleeding: Secondary | ICD-10-CM | POA: Insufficient documentation

## 2015-08-30 HISTORY — DX: Type 2 diabetes mellitus without complications: E11.9

## 2015-08-30 HISTORY — DX: Chronic kidney disease, unspecified: N18.9

## 2015-08-30 HISTORY — DX: Malignant (primary) neoplasm, unspecified: C80.1

## 2015-08-30 HISTORY — PX: COLONOSCOPY WITH PROPOFOL: SHX5780

## 2015-08-30 LAB — GLUCOSE, CAPILLARY: Glucose-Capillary: 117 mg/dL — ABNORMAL HIGH (ref 65–99)

## 2015-08-30 SURGERY — COLONOSCOPY WITH PROPOFOL
Anesthesia: General

## 2015-08-30 MED ORDER — PROPOFOL 500 MG/50ML IV EMUL
INTRAVENOUS | Status: DC | PRN
  Administered 2015-08-30: 175 ug/kg/min via INTRAVENOUS

## 2015-08-30 MED ORDER — PROPOFOL 10 MG/ML IV BOLUS
INTRAVENOUS | Status: DC | PRN
  Administered 2015-08-30: 50 mg via INTRAVENOUS

## 2015-08-30 MED ORDER — LIDOCAINE HCL (CARDIAC) 20 MG/ML IV SOLN
INTRAVENOUS | Status: DC | PRN
  Administered 2015-08-30: 40 mg via INTRAVENOUS

## 2015-08-30 MED ORDER — SODIUM CHLORIDE 0.9 % IV SOLN
INTRAVENOUS | Status: DC
  Administered 2015-08-30: 1000 mL via INTRAVENOUS

## 2015-08-30 MED ORDER — ONDANSETRON HCL 4 MG/2ML IJ SOLN: INTRAMUSCULAR | Status: DC | PRN

## 2015-08-30 NOTE — Transfer of Care (Signed)
Immediate Anesthesia Transfer of Care Note  Patient: [name]  Procedure(s) Performed: Procedure(s): COLONOSCOPY WITH PROPOFOL (N/A)  Patient Location: PACU and Endoscopy Unit  Anesthesia Type:General  Level of Consciousness: sedated  Airway & Oxygen Therapy: Patient Spontanous Breathing and Patient connected to nasal cannula oxygen  Post-op Assessment: Report given to RN and Post -op Vital signs reviewed and stable  Post vital signs: Reviewed and stable  Last Vitals:  Filed Vitals:   08/30/15 0855 08/30/15 0856  BP: 154/133 115/62  Pulse: 52 52  Temp: 36 C   Resp: 13 11    Complications: No apparent anesthesia complications

## 2015-08-30 NOTE — H&P (Signed)
[name] [id number] 11-13-1931     HPI: Past history of colonic polyps. For f/u exam.  Tolerated prep well. Denies any change in his excellent health.   Prescriptions prior to admission  Medication Sig Dispense Refill Last Dose  . amLODipine (NORVASC) 10 MG tablet Take 10 mg by mouth daily.    08/30/2015 at 0630  . cloNIDine (CATAPRES - DOSED IN MG/24 HR) 0.2 mg/24hr patch Place 0.2 mg onto the skin once a week.    Taking  . GLIPIZIDE XL 2.5 MG 24 hr tablet Take 2.5 mg by mouth daily with breakfast.    Taking  . JALYN 0.5-0.4 MG CAPS daily.    Taking  . labetalol (NORMODYNE) 100 MG tablet    Taking  . Multiple Vitamin (MULTIVITAMIN) capsule Take 1 capsule by mouth daily.   Taking  . polyethylene glycol powder (GLYCOLAX/MIRALAX) powder 255 grams one bottle for colonoscopy prep 255 g 0    Allergies  Allergen Reactions  . Penicillins Rash   Past Medical History  Diagnosis Date  . Hypertension 1980  . Heart murmur 2008  . Retinal detachment   . Hyperlipidemia   . Colon polyp   . Diabetes mellitus without complication ([place])   . Chronic kidney disease   . Cancer [facility])    Past Surgical History  Procedure Laterality Date  . Hernia repair    . Eye surgery    . Colonoscopy  2011,06/23/13    Dr [name]  . Colonoscopy  06-28-2011  . ]    . Polypectomy  06-23-13   Social History   Social History  . Marital Status: Married    Spouse Name: N/A  . Number of Children: N/A  . Years of Education: N/A   Occupational History  . Not on file.   Social History Main Topics  . Smoking status: Never Smoker   . Smokeless tobacco: Never Used  . Alcohol Use: No  . Drug Use: No  . Sexual Activity: Not on file   Other Topics Concern  . Not on file   Social History Narrative   Social History   Social History Narrative     ROS: Negative.     PE: HEENT: Negative. Lungs: Clear. Cardio: RR.  II/VI SM [name] 08/30/2015   Assessment/Plan:  Proceed with planned  endoscopy.

## 2015-08-30 NOTE — Anesthesia Procedure Notes (Signed)
Date/Time: 08/30/2015 8:26 AM Performed by: Doreen Salvage Pre-anesthesia Checklist: Patient identified, Emergency Drugs available, Suction available and Patient being monitored Patient Re-evaluated:Patient Re-evaluated prior to inductionOxygen Delivery Method: Nasal cannula Intubation Type: IV induction Dental Injury: Teeth and Oropharynx as per pre-operative assessment  Comments: Nasal cannula with etCO2 monitoring

## 2015-08-30 NOTE — Op Note (Signed)
[facility] Gastroenterology Patient Name: [name] Procedure Date: 08/30/2015 8:28 AM MRN: [id number] Account #: [id number] Date of Birth: December 25, 1931 Admit Type: Outpatient Age: 82 Room: [facility] ENDO ROOM 4 Gender: Male Note Status: Finalized Procedure:            Colonoscopy Indications:          High risk colon cancer surveillance: Personal history                        of colonic polyps Providers:            [name], MD Referring MD:         Daniel Nones, MD (Referring MD) Medicines:            Monitored Anesthesia Care Complications:        No immediate complications. Procedure:            Pre-Anesthesia Assessment:                       - Prior to the procedure, a History and Physical was                        performed, and patient medications, allergies and                        sensitivities were reviewed. The patient's tolerance of                        previous anesthesia was reviewed.                       - The risks and benefits of the procedure and the                        sedation options and risks were discussed with the                        patient. All questions were answered and informed                        consent was obtained.                       After obtaining informed consent, the colonoscope was                        passed under direct vision. Throughout the procedure,                        the patient's blood pressure, pulse, and oxygen                        saturations were monitored continuously. The                        Colonoscope was introduced through the anus and                        advanced to the the cecum, identified by appendiceal  orifice and ileocecal valve. The colonoscopy was                        performed without difficulty. The patient tolerated the                        procedure well. The quality of the bowel preparation                        was  excellent. Findings:      A 14 mm polyp was found in the ascending colon. The polyp was sessile.       The polyp was removed with a hot snare. Polyp resection was incomplete.       The resected tissue was retrieved.      Two sessile polyps were found in the distal ascending colon. The polyps       were 6 mm in size.      A 20 mm polyp was found in the proximal transverse colon. The polyp was       sessile. The polyp was removed with a hot snare. Polyp resection was       incomplete. The resected tissue was retrieved.      A few medium-mouthed diverticula were found in the sigmoid colon.      The retroflexed view of the distal rectum and anal verge was normal and       showed no anal or rectal abnormalities. Impression:           - One 14 mm polyp in the ascending colon, removed with                        a hot snare. Incomplete resection. Resected tissue                        retrieved.                       - Two 6 mm polyps in the distal ascending colon.                       - One 20 mm polyp in the proximal transverse colon,                        removed with a hot snare. Incomplete resection.                        Resected tissue retrieved.                       - Diverticulosis in the sigmoid colon.                       - The distal rectum and anal verge are normal on                        retroflexion view. Recommendation:       - Return to my office in 1 week. Procedure Code(s):    --- Professional ---                       [phone], Colonoscopy, flexible; with removal of tumor(s),  polyp(s), or other lesion(s) by snare technique Diagnosis Code(s):    --- Professional ---                       Z86.010, Personal history of colonic polyps                       D12.2, Benign neoplasm of ascending colon                       D12.3, Benign neoplasm of transverse colon (hepatic                        flexure or splenic flexure)                       K57.30,  Diverticulosis of large intestine without                        perforation or abscess without bleeding CPT copyright 2016 American Medical Association. All rights reserved. The codes documented in this report are preliminary and upon coder review may  be revised to meet current compliance requirements. [name], MD 08/30/2015 8:55:57 AM This report has been signed electronically. Number of Addenda: 0 Note Initiated On: 08/30/2015 8:28 AM Scope Withdrawal Time: 0 hours 10 minutes 47 seconds  Total Procedure Duration: 0 hours 20 minutes 6 seconds       [facility]

## 2015-08-30 NOTE — Anesthesia Postprocedure Evaluation (Signed)
Anesthesia Post Note  Patient: [name]  Procedure(s) Performed: Procedure(s) (LRB): COLONOSCOPY WITH PROPOFOL (N/A)  Patient location during evaluation: PACU Anesthesia Type: General Level of consciousness: awake and alert Pain management: pain level controlled Vital Signs Assessment: post-procedure vital signs reviewed and stable Respiratory status: spontaneous breathing, nonlabored ventilation, respiratory function stable and patient connected to nasal cannula oxygen Cardiovascular status: blood pressure returned to baseline and stable Postop Assessment: no signs of nausea or vomiting Anesthetic complications: no    Last Vitals:  Filed Vitals:   08/30/15 0910 08/30/15 0920  BP: 123/58 152/65  Pulse: 54 56  Temp:    Resp: 15 15    Last Pain: There were no vitals filed for this visit.               Martha Clan

## 2015-08-30 NOTE — Anesthesia Preprocedure Evaluation (Signed)
Anesthesia Evaluation  Patient identified by MRN, date of birth, ID band Patient awake    Reviewed: Allergy & Precautions, H&P , NPO status , Patient's Chart, lab work & pertinent test results, reviewed documented beta blocker date and time   History of Anesthesia Complications Negative for: history of anesthetic complications  Airway Mallampati: II  TM Distance: >3 FB Neck ROM: full    Dental no notable dental hx. (+) Teeth Intact, Missing, Caps   Pulmonary neg pulmonary ROS,    Pulmonary exam normal breath sounds clear to auscultation       Cardiovascular Exercise Tolerance: Good hypertension, On Medications (-) angina(-) CAD, (-) Past MI, (-) Cardiac Stents and (-) CABG Normal cardiovascular exam(-) dysrhythmias + Valvular Problems/Murmurs  Rhythm:regular Rate:Normal     Neuro/Psych negative neurological ROS  negative psych ROS   GI/Hepatic negative GI ROS, Neg liver ROS,   Endo/Other  diabetes, Poorly Controlled  Renal/GU CRFRenal disease  negative genitourinary   Musculoskeletal   Abdominal   Peds  Hematology negative hematology ROS (+)   Anesthesia Other Findings Past Medical History:   Hypertension                                    1980         Heart murmur                                    2008         Retinal detachment                                           Hyperlipidemia                                               Colon polyp                                                  Diabetes mellitus without complication (HCC)                 Chronic kidney disease                                       Cancer ([place])                                                 Reproductive/Obstetrics negative OB ROS                             Anesthesia Physical Anesthesia Plan  ASA: II  Anesthesia Plan: General   Post-op Pain Management:    Induction:   Airway Management Planned:    Additional Equipment:   Intra-op  Plan:   Post-operative Plan:   Informed Consent: I have reviewed the patients History and Physical, chart, labs and discussed the procedure including the risks, benefits and alternatives for the proposed anesthesia with the patient or authorized representative who has indicated his/her understanding and acceptance.   Dental Advisory Given  Plan Discussed with: Anesthesiologist, CRNA and Surgeon  Anesthesia Plan Comments:         Anesthesia Quick Evaluation

## 2015-08-31 LAB — SURGICAL PATHOLOGY

## 2015-09-01 ENCOUNTER — Telehealth: Payer: Self-pay | Admitting: *Deleted

## 2015-09-01 NOTE — Telephone Encounter (Signed)
Notified patient as instructed, patient pleased. Discussed follow-up appointments, patient agrees  

## 2015-09-01 NOTE — Telephone Encounter (Signed)
-----   Message from [name], MD sent at 09/01/2015  8:44 AM EDT ----- Notify patient no cancer. Will review at f/u next week. ----- Message -----    From: [facility]    Sent: 08/31/2015   3:08 PM      To: [name], MD

## 2015-09-04 ENCOUNTER — Encounter: Payer: Self-pay | Admitting: General Surgery

## 2015-09-04 ENCOUNTER — Ambulatory Visit (INDEPENDENT_AMBULATORY_CARE_PROVIDER_SITE_OTHER): Payer: Medicare Other | Admitting: General Surgery

## 2015-09-04 VITALS — BP 182/80 | HR 86 | Resp 14 | Ht 69.0 in | Wt 155.0 lb

## 2015-09-04 DIAGNOSIS — Z86010 Personal history of colonic polyps: Secondary | ICD-10-CM | POA: Diagnosis not present

## 2015-09-04 NOTE — Progress Notes (Signed)
Patient ID: [name], male   DOB: 02-24-1932, 82 y.o.   MRN: [id number]  Chief Complaint  Patient presents with  . Colonoscopy    HPI [name] is a 82 y.o. male here today for his post op colonoscopy done on 08/30/15. Patient doing well. The patient is accompanied by his daughter, [name] who was present for the interview and exam.     The patient was seen at his PCP office one-2 days after the procedure and was noted to have a significant elevation in his blood pressure. Blood pressure earlier this month was 162/68. Blood pressure today shows persistent elevation diastolic at [id number]. He does have a follow-up in the next 24-48 hours with his PCP in regards to his blood pressure.  I person reviewed the patient's history HPI   Past Medical History  Diagnosis Date  . Hypertension 1980  . Heart murmur 2008  . Retinal detachment   . Hyperlipidemia   . Colon polyp   . Diabetes mellitus without complication ([place])   . Chronic kidney disease   . Cancer [facility])     Past Surgical History  Procedure Laterality Date  . Hernia repair    . Eye surgery    . Colonoscopy  2011,06/23/13    Dr Bary Castilla  . Colonoscopy  06-28-2011  . ]    . Polypectomy  06-23-13  . Colonoscopy with propofol N/A 08/30/2015    Procedure: COLONOSCOPY WITH PROPOFOL;  Surgeon: [name], MD;  Location: [facility] ENDOSCOPY;  Service: Endoscopy;  Laterality: N/A;    No family history on file.  Social History Social History  Substance Use Topics  . Smoking status: Never Smoker   . Smokeless tobacco: Never Used  . Alcohol Use: No    Allergies  Allergen Reactions  . Penicillins Rash    Current Outpatient Prescriptions  Medication Sig Dispense Refill  . amLODipine (NORVASC) 10 MG tablet Take 10 mg by mouth daily.     . cloNIDine (CATAPRES - DOSED IN MG/24 HR) 0.2 mg/24hr patch Place 0.2 mg onto the skin once a week.     Marland Kitchen GLIPIZIDE XL 2.5 MG 24 hr tablet Take 2.5 mg by mouth daily with breakfast.     .  JALYN 0.5-0.4 MG CAPS daily.     Marland Kitchen labetalol (NORMODYNE) 100 MG tablet     . Multiple Vitamin (MULTIVITAMIN) capsule Take 1 capsule by mouth daily.    . polyethylene glycol powder (GLYCOLAX/MIRALAX) powder 255 grams one bottle for colonoscopy prep 255 g 0   No current facility-administered medications for this visit.    Review of Systems Review of Systems  Constitutional: Negative.   Respiratory: Negative.   Cardiovascular: Negative.     Blood pressure 182/80, pulse 86, resp. rate 14, height 5\' 9"  (1.753 m), weight 155 lb (70.308 kg).  Physical Exam Physical Exam Not performed. Data Reviewed Pathology from 2000 04/12/2016 was reviewed.  06/01/2010 colon tubular adenomas removed from the 8 ascending and transverse colon. Tubulovillous adenoma at the hepatic flexure partially resected and cauterized. Inked. No high-grade dysplasia.  06/28/2011: Normal residual tissue noted at the hepatic flexure. Tubular adenomas removed from the proximal transverse colon, transverse colon and sigmoid colon. No dysplasia.  06/23/2013: Tubular adenoma from the ascending colon, recurrence of the previously resected tubulovillous adenoma at the hepatic flexure. Post endoscopy bleed requiring 1 unit transfusion with a nadir hemoglobin of 7.6. No high-grade dysplasia.  08/30/2015: 14 mm tubular adenoma in the proximal ascending colon,  25 mm polyps in the mid ascending colon per is not biopsy) sees unchanged 20 mm tubulovillous adenoma the hepatic flexure. Evidence of previous ankle from the 2011 study still present. No high-grade dysplasia.  Assessment    Multiple colonic polyps involving the right colon.    Plan    With the exception of isolated polyps in the transverse and sigmoid colon 2013, all of the patient's polyps have been in the distribution of the right colon. The tubular villous polyp at the hepatic flexure has been resistant to full endoscopic resection. The identification of 3 new polyps  in the right colon between January 2015 in March 2017 has led me to more strongly consider elective right hemicolectomy. The patient does have diabetes controlled with oral agents and essential hypertension, but is otherwise health.  Options for management include ongoing surveillance with colonoscopy versus elective right hemicolectomy versus university referral for possible submucosal resection of the hepatic flexure lesion.  The more extensive the endoscopic dissection does raise the risk of bleeding and perforation.  The patient, his daughter and his son [name] (who was not present today) sees we'll review the option notify the office of how they would like to proceed.     PCP:  Candiss Norse, This information has been scribed by Gaspar Cola CMA.   [name] 09/05/2015, 11:11 AM

## 2015-09-12 ENCOUNTER — Ambulatory Visit: Payer: Medicare Other | Admitting: General Surgery

## 2015-09-21 ENCOUNTER — Telehealth: Payer: Self-pay

## 2015-09-21 NOTE — Telephone Encounter (Signed)
[name] called and asks that the patient would like to pick up his medical records to take with him to [facility] for further treatment. He is asking for copies of his last two colonoscopies, pathology reports, and last 3 office notes. He will be by this afternoon to sign and pick these up. Records copied and available as requested, Dr Bary Castilla notified and approved.

## 2015-10-12 ENCOUNTER — Telehealth: Payer: Self-pay | Admitting: *Deleted

## 2015-10-12 NOTE — Telephone Encounter (Signed)
-----   Message from [name], MD sent at 10/11/2015  1:35 PM EDT ----- Patient was going to [facility] to see about polyp removal. No notes in system from there.  Does he have an appointment?  Thanks.

## 2015-10-12 NOTE — Telephone Encounter (Signed)
He states he has recently talked with [facility] but has not made an appointment. He has to get things coordinated with care for his wife as well. He will call us and let us know when his appointment date and time are.

## 2015-10-24 ENCOUNTER — Telehealth: Payer: Self-pay | Admitting: *Deleted

## 2015-10-24 NOTE — Telephone Encounter (Signed)
Patient just wants to let you know that his appointment at [facility] is on 10/26/15 at 1:10pm

## 2015-12-13 ENCOUNTER — Encounter: Payer: Self-pay | Admitting: General Surgery

## 2015-12-13 NOTE — Progress Notes (Unsigned)
The patient underwent mucosal resection of the polyps at the hepatic flexure. Pathology was benign. No dysplasia.  Follow-up exam in one year has been recommended.  The patient will be contacted to determine if this will be done at [facility] or locally.   Case Report Surgical Pathology Report                         Case: [id number]                                Authorizing Provider:  Saunders Revel, MD        Collected:           11/27/2015 1534             Ordering Location:     [facility] [place]            Received:            11/28/2015 [id number]             Pathologist:           Danae Chen, MD                                                     Specimen:    Large Intestine, Cecum, cecum, ascending, hepatic flexure colon polyps                  Final Diagnosis A: Colon, cecum, ascending, hepatic flexure, biopsy  - Adenomatous polyp (multiple fragments), no high-grade dysplasia identified   Clinical History Therapeutic procedure for known colon adenoma.   Gross Description Received is one appropriately labeled container.   Specimen A:  SITE:  "cecum, ascending, hepatic flexure"  METHOD: Polypectomy  MEASURE: 25 x 20 x 4 mm  COMMENT: Multiple tan polypoid tissue fragments  BLOCK:   A1, NTR  (ak)   Microscopic Description Light microscopic examination is performed by Dr. Malva Cogan.    Disclaimer For cases in which immunostains have been performed, the following statement applies: Appropriate positive controls and negative controls (external and/or internal) have been evaluated. These immunostains have not been separately validated for use on decalcified specimens and should be interpreted with caution in that setting. Some of the immunohistochemical reagents used in this case may be classified as analyte specific reagents (ASR). ASRs have performance characteristics determined by the Anatomic Pathology Department, [facility], and have not been cleared or approved by  the [place] Food and Drug Administration (FDA). The FDA has determined that such clearance or approval is not necessary. These tests are used for clinical purposes. They should not be regarded as investigational or for research. This laboratory is certified under the [place] (CLIA-88) as qualified to perform high complexity clinical laboratory testing.    Specimen  Polyps - Large Intestine, Cecum

## 2015-12-14 ENCOUNTER — Telehealth: Payer: Self-pay | Admitting: *Deleted

## 2015-12-14 NOTE — Telephone Encounter (Signed)
Notified patient as instructed, patient pleased and state he has a copy as well. Discussed follow-up appointments and he states that he will follow up with [facility]. The patient is aware to call back for any questions or concerns.

## 2015-12-14 NOTE — Telephone Encounter (Signed)
-----   Message from [name], MD sent at 12/13/2015  8:54 AM EDT ----- Please notify the patient had received a copy of the report from [facility] from June 19. I was glad to see all of the polyps were able to be removed and no cancer was identified. They've recommended a one-year follow-up. Confirm if he'll be having this at [facility] or locally. Thank you

## 2015-12-15 ENCOUNTER — Encounter: Payer: Self-pay | Admitting: General Surgery

## 2017-02-19 ENCOUNTER — Encounter: Payer: Self-pay | Admitting: General Surgery

## 2018-04-01 ENCOUNTER — Other Ambulatory Visit: Payer: Self-pay | Admitting: Nephrology

## 2018-04-01 DIAGNOSIS — M7989 Other specified soft tissue disorders: Secondary | ICD-10-CM

## 2018-04-01 DIAGNOSIS — N184 Chronic kidney disease, stage 4 (severe): Secondary | ICD-10-CM

## 2018-04-02 ENCOUNTER — Ambulatory Visit
Admission: RE | Admit: 2018-04-02 | Discharge: 2018-04-02 | Disposition: A | Discharge status: Home / Self Care | Payer: Medicare Other | Source: Ambulatory Visit | Attending: Nephrology | Admitting: Nephrology

## 2018-04-02 DIAGNOSIS — M7989 Other specified soft tissue disorders: Secondary | ICD-10-CM | POA: Diagnosis present

## 2018-04-04 DIAGNOSIS — I10 Essential (primary) hypertension: Secondary | ICD-10-CM | POA: Insufficient documentation

## 2018-04-04 DIAGNOSIS — E119 Type 2 diabetes mellitus without complications: Secondary | ICD-10-CM | POA: Insufficient documentation

## 2018-04-19 ENCOUNTER — Inpatient Hospital Stay
Admission: EM | Admit: 2018-04-19 | Discharge: 2018-04-21 | DRG: 253 | Disposition: A | Discharge status: Home Health | Payer: Medicare Other | Attending: Internal Medicine | Admitting: Internal Medicine

## 2018-04-19 ENCOUNTER — Encounter: Payer: Self-pay | Admitting: Emergency Medicine

## 2018-04-19 ENCOUNTER — Emergency Department: Payer: Medicare Other

## 2018-04-19 ENCOUNTER — Other Ambulatory Visit: Payer: Self-pay

## 2018-04-19 DIAGNOSIS — E1122 Type 2 diabetes mellitus with diabetic chronic kidney disease: Secondary | ICD-10-CM | POA: Diagnosis not present

## 2018-04-19 DIAGNOSIS — Z79899 Other long term (current) drug therapy: Secondary | ICD-10-CM

## 2018-04-19 DIAGNOSIS — I129 Hypertensive chronic kidney disease with stage 1 through stage 4 chronic kidney disease, or unspecified chronic kidney disease: Secondary | ICD-10-CM | POA: Diagnosis present

## 2018-04-19 DIAGNOSIS — E785 Hyperlipidemia, unspecified: Secondary | ICD-10-CM | POA: Diagnosis present

## 2018-04-19 DIAGNOSIS — I878 Other specified disorders of veins: Secondary | ICD-10-CM | POA: Diagnosis present

## 2018-04-19 DIAGNOSIS — B9562 Methicillin resistant Staphylococcus aureus infection as the cause of diseases classified elsewhere: Secondary | ICD-10-CM | POA: Diagnosis present

## 2018-04-19 DIAGNOSIS — Z66 Do not resuscitate: Secondary | ICD-10-CM | POA: Diagnosis present

## 2018-04-19 DIAGNOSIS — L0390 Cellulitis, unspecified: Secondary | ICD-10-CM | POA: Diagnosis not present

## 2018-04-19 DIAGNOSIS — N184 Chronic kidney disease, stage 4 (severe): Secondary | ICD-10-CM | POA: Diagnosis present

## 2018-04-19 DIAGNOSIS — L97919 Non-pressure chronic ulcer of unspecified part of right lower leg with unspecified severity: Secondary | ICD-10-CM | POA: Diagnosis not present

## 2018-04-19 DIAGNOSIS — L03115 Cellulitis of right lower limb: Secondary | ICD-10-CM | POA: Diagnosis present

## 2018-04-19 DIAGNOSIS — I70209 Unspecified atherosclerosis of native arteries of extremities, unspecified extremity: Secondary | ICD-10-CM | POA: Diagnosis not present

## 2018-04-19 DIAGNOSIS — E1151 Type 2 diabetes mellitus with diabetic peripheral angiopathy without gangrene: Secondary | ICD-10-CM | POA: Diagnosis present

## 2018-04-19 DIAGNOSIS — Z86010 Personal history of colonic polyps: Secondary | ICD-10-CM

## 2018-04-19 DIAGNOSIS — L089 Local infection of the skin and subcutaneous tissue, unspecified: Secondary | ICD-10-CM | POA: Diagnosis present

## 2018-04-19 DIAGNOSIS — F0390 Unspecified dementia without behavioral disturbance: Secondary | ICD-10-CM | POA: Diagnosis present

## 2018-04-19 DIAGNOSIS — Z7984 Long term (current) use of oral hypoglycemic drugs: Secondary | ICD-10-CM

## 2018-04-19 DIAGNOSIS — I70239 Atherosclerosis of native arteries of right leg with ulceration of unspecified site: Secondary | ICD-10-CM | POA: Diagnosis not present

## 2018-04-19 LAB — CBC WITH DIFFERENTIAL/PLATELET
Abs Immature Granulocytes: 0.1 10*3/uL — ABNORMAL HIGH (ref 0.00–0.07)
Basophils Absolute: 0.1 10*3/uL (ref 0.0–0.1)
Basophils Relative: 0 %
Eosinophils Absolute: 0.2 10*3/uL (ref 0.0–0.5)
Eosinophils Relative: 1 %
HEMATOCRIT: 34 % — AB (ref 39.0–52.0)
HEMOGLOBIN: 11.4 g/dL — AB (ref 13.0–17.0)
Immature Granulocytes: 1 %
LYMPHS ABS: 0.9 10*3/uL (ref 0.7–4.0)
LYMPHS PCT: 6 %
MCH: 31.6 pg (ref 26.0–34.0)
MCHC: 33.5 g/dL (ref 30.0–36.0)
MCV: 94.2 fL (ref 80.0–100.0)
MONO ABS: 1.5 10*3/uL — AB (ref 0.1–1.0)
Monocytes Relative: 11 %
NEUTROS ABS: 11.7 10*3/uL — AB (ref 1.7–7.7)
Neutrophils Relative %: 81 %
Platelets: 183 10*3/uL (ref 150–400)
RBC: 3.61 MIL/uL — AB (ref 4.22–5.81)
RDW: 12.4 % (ref 11.5–15.5)
WBC: 14.4 10*3/uL — ABNORMAL HIGH (ref 4.0–10.5)
nRBC: 0 % (ref 0.0–0.2)

## 2018-04-19 LAB — GLUCOSE, CAPILLARY: Glucose-Capillary: 140 mg/dL — ABNORMAL HIGH (ref 70–99)

## 2018-04-19 LAB — BASIC METABOLIC PANEL
Anion gap: 10 (ref 5–15)
BUN: 54 mg/dL — AB (ref 8–23)
CHLORIDE: 109 mmol/L (ref 98–111)
CO2: 19 mmol/L — ABNORMAL LOW (ref 22–32)
Calcium: 8.7 mg/dL — ABNORMAL LOW (ref 8.9–10.3)
Creatinine, Ser: 2.8 mg/dL — ABNORMAL HIGH (ref 0.61–1.24)
GFR calc Af Amer: 22 mL/min — ABNORMAL LOW (ref >60)
GFR calc non Af Amer: 19 mL/min — ABNORMAL LOW (ref >60)
Glucose, Bld: 168 mg/dL — ABNORMAL HIGH (ref 70–99)
POTASSIUM: 4.6 mmol/L (ref 3.5–5.1)
SODIUM: 138 mmol/L (ref 135–145)

## 2018-04-19 LAB — MRSA PCR SCREENING: MRSA BY PCR: POSITIVE — AB

## 2018-04-19 MED ORDER — VANCOMYCIN HCL IN DEXTROSE 1-5 GM/200ML-% IV SOLN
1000.0000 mg | Freq: Once | INTRAVENOUS | Status: AC
  Administered 2018-04-19: 1000 mg via INTRAVENOUS
  Filled 2018-04-19: qty 200

## 2018-04-19 MED ORDER — CLONIDINE HCL 0.2 MG/24HR TD PTWK
0.2000 mg | MEDICATED_PATCH | TRANSDERMAL | Status: DC
  Administered 2018-04-19: 0.2 mg via TRANSDERMAL
  Filled 2018-04-19: qty 1

## 2018-04-19 MED ORDER — CHLORHEXIDINE GLUCONATE CLOTH 2 % EX PADS
6.0000 | MEDICATED_PAD | Freq: Every day | CUTANEOUS | Status: DC
  Administered 2018-04-20 – 2018-04-21 (×2): 6 via TOPICAL

## 2018-04-19 MED ORDER — CEFAZOLIN SODIUM-DEXTROSE 1-4 GM/50ML-% IV SOLN
1.0000 g | Freq: Once | INTRAVENOUS | Status: AC
  Administered 2018-04-19: 1 g via INTRAVENOUS
  Filled 2018-04-19: qty 50

## 2018-04-19 MED ORDER — DOCUSATE SODIUM 100 MG PO CAPS: 100.0000 mg | ORAL_CAPSULE | Freq: Two times a day (BID) | ORAL | Status: DC | PRN

## 2018-04-19 MED ORDER — LABETALOL HCL 100 MG PO TABS
100.0000 mg | ORAL_TABLET | Freq: Every day | ORAL | Status: DC
  Administered 2018-04-20 – 2018-04-21 (×2): 100 mg via ORAL
  Filled 2018-04-19 (×3): qty 1

## 2018-04-19 MED ORDER — AMLODIPINE BESYLATE 10 MG PO TABS
10.0000 mg | ORAL_TABLET | Freq: Every day | ORAL | Status: DC
  Administered 2018-04-20 – 2018-04-21 (×2): 10 mg via ORAL
  Filled 2018-04-19 (×2): qty 1

## 2018-04-19 MED ORDER — VANCOMYCIN HCL IN DEXTROSE 1-5 GM/200ML-% IV SOLN
1000.0000 mg | INTRAVENOUS | Status: DC
  Administered 2018-04-20: 1000 mg via INTRAVENOUS
  Filled 2018-04-19: qty 200

## 2018-04-19 MED ORDER — DUTASTERIDE-TAMSULOSIN HCL 0.5-0.4 MG PO CAPS: 1.0000 | ORAL_CAPSULE | Freq: Every day | ORAL | Status: DC

## 2018-04-19 MED ORDER — ADULT MULTIVITAMIN W/MINERALS CH
1.0000 | ORAL_TABLET | Freq: Every day | ORAL | Status: DC
  Administered 2018-04-20 – 2018-04-21 (×2): 1 via ORAL
  Filled 2018-04-19 (×2): qty 1

## 2018-04-19 MED ORDER — INSULIN ASPART 100 UNIT/ML ~~LOC~~ SOLN
0.0000 [IU] | Freq: Three times a day (TID) | SUBCUTANEOUS | Status: DC
  Administered 2018-04-20: 3 [IU] via SUBCUTANEOUS
  Administered 2018-04-20 – 2018-04-21 (×3): 1 [IU] via SUBCUTANEOUS
  Filled 2018-04-19 (×3): qty 1

## 2018-04-19 MED ORDER — SODIUM CHLORIDE 0.9 % IV SOLN
1.0000 g | INTRAVENOUS | Status: DC
  Administered 2018-04-20 – 2018-04-21 (×2): 1 g via INTRAVENOUS
  Filled 2018-04-19: qty 1
  Filled 2018-04-19: qty 10
  Filled 2018-04-19: qty 1

## 2018-04-19 MED ORDER — DUTASTERIDE 0.5 MG PO CAPS
0.5000 mg | ORAL_CAPSULE | Freq: Every day | ORAL | Status: DC
  Administered 2018-04-19 – 2018-04-21 (×3): 0.5 mg via ORAL
  Filled 2018-04-19 (×3): qty 1

## 2018-04-19 MED ORDER — MUPIROCIN 2 % EX OINT
1.0000 "application " | TOPICAL_OINTMENT | Freq: Two times a day (BID) | CUTANEOUS | Status: DC
  Administered 2018-04-20 – 2018-04-21 (×4): 1 via NASAL
  Filled 2018-04-19: qty 22

## 2018-04-19 MED ORDER — FUROSEMIDE 40 MG PO TABS
40.0000 mg | ORAL_TABLET | Freq: Every day | ORAL | Status: DC
  Administered 2018-04-20 – 2018-04-21 (×2): 40 mg via ORAL
  Filled 2018-04-19 (×2): qty 1

## 2018-04-19 MED ORDER — TAMSULOSIN HCL 0.4 MG PO CAPS
0.4000 mg | ORAL_CAPSULE | Freq: Every day | ORAL | Status: DC
  Administered 2018-04-19 – 2018-04-21 (×3): 0.4 mg via ORAL
  Filled 2018-04-19 (×3): qty 1

## 2018-04-19 MED ORDER — HEPARIN SODIUM (PORCINE) 5000 UNIT/ML IJ SOLN
5000.0000 [IU] | Freq: Three times a day (TID) | INTRAMUSCULAR | Status: DC
  Administered 2018-04-19 – 2018-04-21 (×5): 5000 [IU] via SUBCUTANEOUS
  Filled 2018-04-19 (×4): qty 1

## 2018-04-19 NOTE — Progress Notes (Signed)
Family Meeting Note  Advance Directive:yes  Today a meeting took place with the Patient and daughter in law ( POA).  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

## 2018-04-19 NOTE — ED Triage Notes (Signed)
First Nurse Note:  Sore / redness, drainage to right lower leg. Patient recently had cellulitis to left lower leg.  Was told by home health to come to ED for evaluation.

## 2018-04-19 NOTE — Consult Note (Signed)
Pharmacy Antibiotic Note  [name] is a 84 y.o. male admitted on 04/19/2018 with cellulitis.  Pharmacy has been consulted for Vancomycin dosing.  Plan: Vancomycin 1000 IV every 48 hours.  Goal trough 10-15 mcg/mL. Stacked dosing 24 hours with a trough drawn prior to the 4th dose  Ke 0.02 VD 47.6 t1/2 34.66 - predicted trough of 13  Height: 5\' 8"  (172.7 cm) Weight: 150 lb (68 kg) IBW/kg (Calculated) : 68.4  Temp (24hrs), Avg:98.7 F (37.1 C), Min:98.7 F (37.1 C), Max:98.7 F (37.1 C)  Recent Labs  Lab 04/19/18 1627  WBC 14.4*  CREATININE 2.80*    Estimated Creatinine Clearance: 18.9 mL/min (A) (by C-G formula based on SCr of 2.8 mg/dL (H)).    Allergies  Allergen Reactions  . Penicillins Rash    Antimicrobials this admission: Cefazolin 11/10 >> 11/10 Cetriaxone 11/11 >>  Vancomycin 11/11 >>  Dose adjustments this admission: N/A  Microbiology results:  11/10 MRSA PCR: pending  Thank you for allowing pharmacy to be a part of this patient's care.  Lu Duffel, PharmD Clinical Pharmacist 04/19/2018 6:16 PM

## 2018-04-19 NOTE — ED Provider Notes (Signed)
[facility] Emergency Department Provider Note  ____________________________________________   I have reviewed the triage vital signs and the nursing notes. Where available I have reviewed prior notes and, if possible and indicated, outside hospital notes.    HISTORY  Chief Complaint Wound Infection    HPI [name] is a 84 y.o. male  Who presents today complaining of a area of what is thought to be infection on the right lower extremity.  Patient has a history of chronic venous stasis.  Approximately 3 weeks ago he was admitted to an outside hospital for an infection on the left leg which he received antibiotics for and got better, he has a home nurse and she noticed 2 days ago a very small area on that right lower leg which is now progressed to be approximately the size of his palm.  There is a hemorrhagic small bolus noted as well.  He is not systemically ill, no fevers, it is mildly tender to touch.  No other complaints.  Sent in by home health nurse.  Patient's last admission, patient did show improvement with Ceftin ear and Flagyl.  They did not have it appears a specific bacterial findings.  He had negative blood cultures at that time.  Did also receive vancomycin.   Past Medical History:  Diagnosis Date  . Cancer ([place])   . Chronic kidney disease   . Colon polyp   . Diabetes mellitus without complication ([place])   . Heart murmur 2008  . Hyperlipidemia   . Hypertension 1980  . Retinal detachment     Patient Active Problem List   Diagnosis Date Noted  . History of colonic polyps 07/14/2015    Past Surgical History:  Procedure Laterality Date  . COLONOSCOPY  2011,06/23/13   Dr Bary Castilla  . COLONOSCOPY  06-28-2011  . COLONOSCOPY WITH PROPOFOL N/A 08/30/2015   Procedure: COLONOSCOPY WITH PROPOFOL;  Surgeon: [name], MD;  Location: [facility] ENDOSCOPY;  Service: Endoscopy;  Laterality: N/A;  . EYE SURGERY    . HERNIA REPAIR    . POLYPECTOMY   06-23-13  . ]      Prior to Admission medications   Medication Sig Start Date End Date Taking? Authorizing Provider  amLODipine (NORVASC) 10 MG tablet Take 10 mg by mouth daily.  05/28/13   [provider]  cloNIDine (CATAPRES - DOSED IN MG/24 HR) 0.2 mg/24hr patch Place 0.2 mg onto the skin once a week.  06/09/13   [provider]  GLIPIZIDE XL 2.5 MG 24 hr tablet Take 2.5 mg by mouth daily with breakfast.  05/25/13   [provider]  JALYN 0.5-0.4 MG CAPS daily.  05/25/13   [provider]  labetalol (NORMODYNE) 100 MG tablet  06/22/15   [provider]  Multiple Vitamin (MULTIVITAMIN) capsule Take 1 capsule by mouth daily.    [provider]  polyethylene glycol powder (GLYCOLAX/MIRALAX) powder 255 grams one bottle for colonoscopy prep 07/13/15   [name], MD    Allergies Penicillins  No family history on file.  Social History Social History   Tobacco Use  . Smoking status: Never Smoker  . Smokeless tobacco: Never Used  Substance Use Topics  . Alcohol use: No  . Drug use: No    Review of Systems Constitutional: No fever/chills Eyes: No visual changes. ENT: No sore throat. No stiff neck no neck pain Cardiovascular: Denies chest pain. Respiratory: Denies shortness of breath. Gastrointestinal:   no vomiting.  No  diarrhea.  No constipation. Genitourinary: Negative for dysuria. Musculoskeletal: Negative lower extremity swelling Skin: + for rash. Neurological: Negative for severe headaches, focal weakness or numbness.   ____________________________________________   PHYSICAL EXAM:  VITAL SIGNS: ED Triage Vitals  Enc Vitals Group     BP 04/19/18 1530 (!) 172/63     Pulse Rate 04/19/18 1530 80     Resp --      Temp 04/19/18 1530 98.7 F (37.1 C)     Temp Source 04/19/18 1530 Oral     SpO2 04/19/18 1530 99 %     Weight 04/19/18 1531 150 lb (68 kg)     Height 04/19/18 1531 5\' 8"  (1.727 m)     Head  Circumference --      Peak Flow --      Pain Score 04/19/18 1536 6     Pain Loc --      Pain Edu? --      Excl. in [place]? --     Constitutional: Alert and oriented. Well appearing and in no acute distress. Eyes: Conjunctivae are normal Head: Atraumatic HEENT: No congestion/rhinnorhea. Mucous membranes are moist.  Oropharynx non-erythematous Neck:   Nontender with no meningismus, no masses, no stridor Cardiovascular: Normal rate, regular rhythm. Grossly normal heart sounds.  Good peripheral circulation. Respiratory: Normal respiratory effort.  No retractions. Lungs CTAB. Abdominal: Soft and nontender. No distention. No guarding no rebound Back:  There is no focal tenderness or step off.  there is no midline tenderness there are no lesions noted. there is no CVA tenderness Musculoskeletal: No lower extremity tenderness, no upper extremity tenderness. No joint effusions, no DVT signs strong distal pulses no edema Neurologic:  Normal speech and language. No gross focal neurologic deficits are appreciated.  Skin:  Skin is warm, dry and intact.  Venous stasis noticed bilaterally with healing sores noted in the left leg, right lower extremity has a bolus which is approximately 3 x 8 cm, it is fluctuant and there is some blood present in side.  There is no crepitus there is no induration of the compartments, there is however surrounding erythema.  Is blanchable. Psychiatric: Mood and affect are normal. Speech and behavior are normal.  ____________________________________________   LABS (all labs ordered are listed, but only abnormal results are displayed)  Labs Reviewed  AEROBIC CULTURE (SUPERFICIAL SPECIMEN)  CBC WITH DIFFERENTIAL/PLATELET  BASIC METABOLIC PANEL    Pertinent labs  results that were available during my care of the patient were reviewed by me and considered in my medical decision making (see chart for details). ____________________________________________  EKG  I  personally interpreted any EKGs ordered by me or triage  ____________________________________________  RADIOLOGY  Pertinent labs & imaging results that were available during my care of the patient were reviewed by me and considered in my medical decision making (see chart for details). If possible, patient and/or family made aware of any abnormal findings.  No results found. ____________________________________________    PROCEDURES  Procedure(s) performed: None  Procedures  Critical Care performed: None  ____________________________________________   INITIAL IMPRESSION / ASSESSMENT AND PLAN / ED COURSE  Pertinent labs & imaging results that were available during my care of the patient were reviewed by me and considered in my medical decision making (see chart for details).  She with a history of chronic venous stasis with a recent infection on the left leg presents with what appears to be a right lower extremity cellulitic pathology.  Trauma is considered vibrio is  considered, no ocean exposure, he is penicillin allergic, last time he was initially given Vanco a and transition to Ceftin and Flagyl.  We will give him broad-spectrum antibiotics as this does appear to be going somewhat rapidly, there is no evidence to my exam of gas gangrene or crepitus, he is not systemically ill I see no utility in blood cultures this certainly did not show anything last time.   ____________________________________________   FINAL CLINICAL IMPRESSION(S) / ED DIAGNOSES  Final diagnoses:  None      This chart was dictated using voice recognition software.  Despite best efforts to proofread,  errors can occur which can change meaning.      Schuyler Amor, MD 04/19/18 [id number]

## 2018-04-19 NOTE — ED Notes (Signed)
Admitting MD at bedside.

## 2018-04-19 NOTE — ED Triage Notes (Signed)
Pt to ED via POV, per family member pt has wound on his right leg, that is red, open, and draining. Pt currently being treated for cellulitis in the left leg and was advised by [facility] nurse that he needed to come in if right leg started showing signs of infection. Pt is in NAD at this time.

## 2018-04-19 NOTE — H&P (Signed)
[place] [facility] [place] NAME: [name]    MR#:  [id number]  DATE OF BIRTH:  [birth year]  DATE OF ADMISSION:  04/19/2018  PRIMARY CARE PHYSICIAN: Marguerita Merles, MD   REQUESTING/REFERRING PHYSICIAN: McShane  CHIEF COMPLAINT:   Chief Complaint  Patient presents with  . Wound Infection    HISTORY OF PRESENT ILLNESS: [name]  is a 84 y.o. male with a known history of chronic kidney disease, colon polyp, diabetes , hyperlipidemia, hypertension, retinal detachment-was admitted for left leg cellulitis at [facility] 10 days ago.  He stayed in the hospital for 4 days and was discharged with 4 more days of antibiotics oral.  He finished that and his left leg ulcers and redness is significantly better.  Wound care nurse is following at home along with physical therapy. On his right leg on the medial aspect 2 days ago wound care nurse and family noted a small blister, which was a dime size at that time.  The nurse applied some dressing and today when she came for follow-up they noted lesion has grown almost 6 to 7 cm size.  Concerned with this family brought him to the emergency room. X-ray did not show air in the tissue or bony involvement. Patient have some dementia so his daughter and daughter-in-law are giving me histories. They denies him having any fever but he had chills.  He also had pain on walking on his right leg.  PAST MEDICAL HISTORY:   Past Medical History:  Diagnosis Date  . Cancer ([place])   . Chronic kidney disease   . Colon polyp   . Diabetes mellitus without complication ([place])   . Heart murmur 2008  . Hyperlipidemia   . Hypertension 1980  . Retinal detachment     PAST SURGICAL HISTORY:  Past Surgical History:  Procedure Laterality Date  . COLONOSCOPY  2011,06/23/13   Dr Bary Castilla  . COLONOSCOPY  06-28-2011  . COLONOSCOPY WITH PROPOFOL N/A 08/30/2015   Procedure: COLONOSCOPY WITH PROPOFOL;  Surgeon: [name], MD;  Location: [facility]  ENDOSCOPY;  Service: Endoscopy;  Laterality: N/A;  . EYE SURGERY    . HERNIA REPAIR    . POLYPECTOMY  06-23-13  . ]      SOCIAL HISTORY:  Social History   Tobacco Use  . Smoking status: Never Smoker  . Smokeless tobacco: Never Used  Substance Use Topics  . Alcohol use: No    FAMILY HISTORY: No family history on file.  DRUG ALLERGIES:  Allergies  Allergen Reactions  . Penicillins Rash    REVIEW OF SYSTEMS:   Due to dementia patient is not able to give review of system.  MEDICATIONS AT HOME:  Prior to Admission medications   Medication Sig Start Date End Date Taking? Authorizing Provider  amLODipine (NORVASC) 10 MG tablet Take 10 mg by mouth daily.  05/28/13  Yes [provider]  cloNIDine (CATAPRES - DOSED IN MG/24 HR) 0.2 mg/24hr patch Place 0.2 mg onto the skin every Sunday.    Yes [provider]  furosemide (LASIX) 40 MG tablet Take 40 mg by mouth daily. 03/25/18  Yes [provider]  GLIPIZIDE XL 2.5 MG 24 hr tablet Take 2.5 mg by mouth daily.    Yes [provider]  JALYN 0.5-0.4 MG CAPS Take 1 capsule by mouth daily.    Yes [provider]  labetalol (NORMODYNE) 200 MG tablet Take 100 mg by mouth daily. 03/25/18  Yes  [provider]  Multiple Vitamin (MULTIVITAMIN) capsule Take 1 capsule by mouth daily.   Yes [provider]  polyethylene glycol powder (GLYCOLAX/MIRALAX) powder 255 grams one bottle for colonoscopy prep 07/13/15   [name], MD      PHYSICAL EXAMINATION:   VITAL SIGNS: Blood pressure (!) 191/79, pulse 74, temperature 98.7 F (37.1 C), temperature source Oral, resp. rate 16, height 5\' 8"  (1.727 m), weight 68 kg, SpO2 99 %.  GENERAL:  84 y.o.-year-old patient lying in the bed with no acute distress.  EYES: Pupils equal, round, reactive to light and accommodation. No scleral icterus. Extraocular muscles intact.  HEENT: Head atraumatic, normocephalic. Oropharynx and nasopharynx  clear.  NECK:  Supple, no jugular venous distention. No thyroid enlargement, no tenderness.  LUNGS: Normal breath sounds bilaterally, no wheezing, rales,rhonchi or crepitation. No use of accessory muscles of respiration.  CARDIOVASCULAR: S1, S2 normal. No murmurs, rubs, or gallops.  ABDOMEN: Soft, nontender, nondistended. Bowel sounds present. No organomegaly or mass.  EXTREMITIES: Both legs have dark-colored skin with some ulcers as a sign of peripheral vascular disease.  On the left lower calf on the posterior side he had a small ulcer.  On the right leg medial aspect he had 5 cm x 4 cm size yellowish discharge filled blister.  Skin over the blister is still intact, no deep tissue exposed.  His dorsalis pedis pulses are weak. NEUROLOGIC: Cranial nerves II through XII are intact. Muscle strength 4/5 in all extremities. Sensation intact. Gait not checked.  PSYCHIATRIC: The patient is alert and oriented x 1.  SKIN: As mentioned above.Marland Kitchen   LABORATORY PANEL:   CBC Recent Labs  Lab 04/19/18 1627  WBC 14.4*  HGB 11.4*  HCT 34.0*  PLT 183  MCV 94.2  MCH 31.6  MCHC 33.5  RDW 12.4  LYMPHSABS 0.9  MONOABS 1.5*  EOSABS 0.2  BASOSABS 0.1   ------------------------------------------------------------------------------------------------------------------  Chemistries  Recent Labs  Lab 04/19/18 1627  NA 138  K 4.6  CL 109  CO2 19*  GLUCOSE 168*  BUN 54*  CREATININE 2.80*  CALCIUM 8.7*   ------------------------------------------------------------------------------------------------------------------ estimated creatinine clearance is 18.9 mL/min (A) (by C-G formula based on SCr of 2.8 mg/dL (H)). ------------------------------------------------------------------------------------------------------------------ No results for input(s): TSH, T4TOTAL, T3FREE, THYROIDAB in the last 72 hours.  Invalid input(s): FREET3   Coagulation profile No results for input(s): INR, PROTIME in the  last 168 hours. ------------------------------------------------------------------------------------------------------------------- No results for input(s): DDIMER in the last 72 hours. -------------------------------------------------------------------------------------------------------------------  Cardiac Enzymes No results for input(s): CKMB, TROPONINI, MYOGLOBIN in the last 168 hours.  Invalid input(s): CK ------------------------------------------------------------------------------------------------------------------ Invalid input(s): POCBNP  ---------------------------------------------------------------------------------------------------------------  Urinalysis No results found for: COLORURINE, APPEARANCEUR, LABSPEC, PHURINE, GLUCOSEU, HGBUR, BILIRUBINUR, KETONESUR, PROTEINUR, UROBILINOGEN, NITRITE, LEUKOCYTESUR   RADIOLOGY: Dg Tibia/fibula Right  Result Date: 04/19/2018 CLINICAL DATA:  84 y/o M; soreness, redness, and drainage from the right lower leg. EXAM: RIGHT TIBIA AND FIBULA - 2 VIEW COMPARISON:  None. FINDINGS: There is no evidence of fracture or other focal bone lesions. Bones are demineralized. Vascular calcifications noted. IMPRESSION: No findings of osteomyelitis.  No acute fracture or dislocation. Electronically Signed   By: Kristine Garbe M.D.   On: 04/19/2018 17:12    EKG: Orders placed or performed in visit on 01/11/08  . EKG 12-Lead    IMPRESSION AND PLAN:  *Cellulitis in diabetic patient IV antibiotics for now. Will need evaluation by vascular physician.  *Atherosclerosis of peripheral arteries of both legs along with ulcer and infection secondary  to diabetes Will call vascular surgery consult.  *Diabetes mellitus with complications of peripheral bilateral disease We will hold oral medication and keep on sliding scale coverage.  *Chronic kidney disease stage IV Appears stable, continue to monitor with IV  antibiotics.  *Hypertension Continue amlodipine, clonidine, furosemide, labetalol  All the records are reviewed and case discussed with ED provider. Management plans discussed with the patient, family and they are in agreement.  CODE STATUS: DNR Advance Directive Documentation     Most Recent Value  Type of Advance Directive  Healthcare Power of Attorney  Pre-existing out of facility DNR order (yellow form or pink MOST form)  -  "MOST" Form in Place?  -     Patient's daughter and his daughter-in-law(POA) are present in the room during my visit.  TOTAL TIME TAKING CARE OF THIS PATIENT: 50 minutes.    Vaughan Basta M.D on 04/19/2018   Between 7am to 6pm - Pager - [phone]  After 6pm go to [email or web address] - password [email or web address] [facility]  Office  [phone]  CC: Primary care physician; Marguerita Merles, MD   Note: This dictation was prepared with Dragon dictation along with smaller phrase technology. Any transcriptional errors that result from this process are unintentional.

## 2018-04-20 ENCOUNTER — Encounter
Admission: EM | Disposition: A | Discharge status: Home Health | Payer: Self-pay | Source: Home / Self Care | Attending: Internal Medicine

## 2018-04-20 DIAGNOSIS — I70239 Atherosclerosis of native arteries of right leg with ulceration of unspecified site: Secondary | ICD-10-CM

## 2018-04-20 DIAGNOSIS — N184 Chronic kidney disease, stage 4 (severe): Secondary | ICD-10-CM

## 2018-04-20 DIAGNOSIS — E1122 Type 2 diabetes mellitus with diabetic chronic kidney disease: Secondary | ICD-10-CM

## 2018-04-20 DIAGNOSIS — L97919 Non-pressure chronic ulcer of unspecified part of right lower leg with unspecified severity: Secondary | ICD-10-CM

## 2018-04-20 DIAGNOSIS — L0390 Cellulitis, unspecified: Secondary | ICD-10-CM

## 2018-04-20 DIAGNOSIS — I129 Hypertensive chronic kidney disease with stage 1 through stage 4 chronic kidney disease, or unspecified chronic kidney disease: Secondary | ICD-10-CM

## 2018-04-20 HISTORY — PX: LOWER EXTREMITY ANGIOGRAPHY: CATH118251

## 2018-04-20 LAB — CBC
HCT: 32.4 % — ABNORMAL LOW (ref 39.0–52.0)
Hemoglobin: 10.6 g/dL — ABNORMAL LOW (ref 13.0–17.0)
MCH: 31 pg (ref 26.0–34.0)
MCHC: 32.7 g/dL (ref 30.0–36.0)
MCV: 94.7 fL (ref 80.0–100.0)
Platelets: 154 10*3/uL (ref 150–400)
RBC: 3.42 MIL/uL — ABNORMAL LOW (ref 4.22–5.81)
RDW: 12.3 % (ref 11.5–15.5)
WBC: 11.2 10*3/uL — ABNORMAL HIGH (ref 4.0–10.5)
nRBC: 0 % (ref 0.0–0.2)

## 2018-04-20 LAB — BASIC METABOLIC PANEL
Anion gap: 7 (ref 5–15)
BUN: 51 mg/dL — ABNORMAL HIGH (ref 8–23)
CO2: 21 mmol/L — ABNORMAL LOW (ref 22–32)
Calcium: 8.6 mg/dL — ABNORMAL LOW (ref 8.9–10.3)
Chloride: 113 mmol/L — ABNORMAL HIGH (ref 98–111)
Creatinine, Ser: 2.52 mg/dL — ABNORMAL HIGH (ref 0.61–1.24)
GFR calc Af Amer: 25 mL/min — ABNORMAL LOW (ref >60)
GFR calc non Af Amer: 22 mL/min — ABNORMAL LOW (ref >60)
Glucose, Bld: 130 mg/dL — ABNORMAL HIGH (ref 70–99)
Potassium: 4.3 mmol/L (ref 3.5–5.1)
Sodium: 141 mmol/L (ref 135–145)

## 2018-04-20 LAB — GLUCOSE, CAPILLARY
GLUCOSE-CAPILLARY: 135 mg/dL — AB (ref 70–99)
GLUCOSE-CAPILLARY: 229 mg/dL — AB (ref 70–99)
Glucose-Capillary: 107 mg/dL — ABNORMAL HIGH (ref 70–99)
Glucose-Capillary: 135 mg/dL — ABNORMAL HIGH (ref 70–99)

## 2018-04-20 SURGERY — LOWER EXTREMITY ANGIOGRAPHY
Anesthesia: Moderate Sedation | Laterality: Right

## 2018-04-20 MED ORDER — CLINDAMYCIN PHOSPHATE 300 MG/50ML IV SOLN
INTRAVENOUS | Status: AC
  Administered 2018-04-20: 10:00:00
  Filled 2018-04-20: qty 50

## 2018-04-20 MED ORDER — CLOPIDOGREL BISULFATE 75 MG PO TABS
75.0000 mg | ORAL_TABLET | Freq: Every day | ORAL | Status: DC
  Administered 2018-04-20 – 2018-04-21 (×2): 75 mg via ORAL
  Filled 2018-04-20 (×2): qty 1

## 2018-04-20 MED ORDER — HEPARIN (PORCINE) IN NACL 1000-0.9 UT/500ML-% IV SOLN
INTRAVENOUS | Status: AC
  Filled 2018-04-20: qty 1000

## 2018-04-20 MED ORDER — SODIUM CHLORIDE 0.9 % IV SOLN
INTRAVENOUS | Status: DC | PRN
  Administered 2018-04-20 – 2018-04-21 (×2): 250 mL via INTRAVENOUS

## 2018-04-20 MED ORDER — MIDAZOLAM HCL 2 MG/2ML IJ SOLN
INTRAMUSCULAR | Status: DC | PRN
  Administered 2018-04-20: 1 mg via INTRAVENOUS

## 2018-04-20 MED ORDER — HEPARIN SODIUM (PORCINE) 1000 UNIT/ML IJ SOLN
INTRAMUSCULAR | Status: AC
  Filled 2018-04-20: qty 1

## 2018-04-20 MED ORDER — ACETAMINOPHEN 325 MG PO TABS: 650.0000 mg | ORAL_TABLET | Freq: Four times a day (QID) | ORAL | Status: DC | PRN

## 2018-04-20 MED ORDER — LACTATED RINGERS IV SOLN
INTRAVENOUS | Status: AC
  Administered 2018-04-20: 13:00:00 via INTRAVENOUS

## 2018-04-20 MED ORDER — ASPIRIN EC 81 MG PO TBEC
81.0000 mg | DELAYED_RELEASE_TABLET | Freq: Every day | ORAL | Status: DC
  Administered 2018-04-20 – 2018-04-21 (×2): 81 mg via ORAL
  Filled 2018-04-20 (×2): qty 1

## 2018-04-20 MED ORDER — SODIUM CHLORIDE 0.9 % IV BOLUS: 250.0000 mL | Freq: Once | INTRAVENOUS | Status: DC

## 2018-04-20 MED ORDER — IOPAMIDOL (ISOVUE-300) INJECTION 61%
INTRAVENOUS | Status: DC | PRN
  Administered 2018-04-20: 40 mL via INTRA_ARTERIAL

## 2018-04-20 MED ORDER — LIDOCAINE-EPINEPHRINE (PF) 1 %-1:200000 IJ SOLN
INTRAMUSCULAR | Status: AC
  Filled 2018-04-20: qty 30

## 2018-04-20 MED ORDER — FENTANYL CITRATE (PF) 100 MCG/2ML IJ SOLN
INTRAMUSCULAR | Status: DC | PRN
  Administered 2018-04-20: 25 ug via INTRAVENOUS

## 2018-04-20 MED ORDER — FENTANYL CITRATE (PF) 100 MCG/2ML IJ SOLN
INTRAMUSCULAR | Status: DC | PRN
  Administered 2018-04-20: 25 ug via INTRAVENOUS
  Administered 2018-04-20: 50 ug via INTRAVENOUS

## 2018-04-20 MED ORDER — MIDAZOLAM HCL 2 MG/2ML IJ SOLN
INTRAMUSCULAR | Status: AC
  Filled 2018-04-20: qty 2

## 2018-04-20 MED ORDER — HEPARIN SODIUM (PORCINE) 1000 UNIT/ML IJ SOLN
INTRAMUSCULAR | Status: DC | PRN
  Administered 2018-04-20: 4000 [IU] via INTRAVENOUS

## 2018-04-20 MED ORDER — FENTANYL CITRATE (PF) 100 MCG/2ML IJ SOLN
INTRAMUSCULAR | Status: AC
  Filled 2018-04-20: qty 2

## 2018-04-20 MED ORDER — LACTATED RINGERS IV SOLN
INTRAVENOUS | Status: AC
  Administered 2018-04-20: 03:00:00 via INTRAVENOUS

## 2018-04-20 MED ORDER — SODIUM CHLORIDE 0.9 % IV SOLN: INTRAVENOUS | Status: DC

## 2018-04-20 MED ORDER — ATORVASTATIN CALCIUM 10 MG PO TABS
10.0000 mg | ORAL_TABLET | Freq: Every day | ORAL | Status: DC
  Administered 2018-04-20: 10 mg via ORAL
  Filled 2018-04-20 (×2): qty 1

## 2018-04-20 SURGICAL SUPPLY — 26 items
BALLN LUTONIX 018 4X80X130 (BALLOONS) ×3
BALLN ULTRVRSE 018 2.5X150X150 (BALLOONS) ×3
BALLN ULTRVRSE 3X300X150 (BALLOONS) ×2
BALLN ULTRVRSE 3X300X150 OTW (BALLOONS) ×1
BALLOON LUTONIX 018 4X80X130 (BALLOONS) ×1 IMPLANT
BALLOON ULTRVRSE 3X300X150 OTW (BALLOONS) ×1 IMPLANT
BALLOON ULTRVS 018 2.5X150X150 (BALLOONS) ×1 IMPLANT
CATH BEACON 5 .035 65 RIM TIP (CATHETERS) ×3 IMPLANT
CATH CXI SUPP 2.6F 150 ANG (CATHETERS) ×3 IMPLANT
CATH CXI SUPP ANG 4FR 135 (CATHETERS) ×1 IMPLANT
CATH CXI SUPP ANG 4FR 135CM (CATHETERS) ×3
CATH PIG 70CM (CATHETERS) ×3 IMPLANT
CATH VERT 5FR 125CM (CATHETERS) ×3 IMPLANT
COVER PROBE U/S 5X48 (MISCELLANEOUS) ×3 IMPLANT
DEVICE PRESTO INFLATION (MISCELLANEOUS) ×3 IMPLANT
DEVICE STARCLOSE SE CLOSURE (Vascular Products) ×3 IMPLANT
GLIDEWIRE ADV .035X260CM (WIRE) ×3 IMPLANT
GUIDEWIRE PFTE-COATED .018X300 (WIRE) ×3 IMPLANT
PACK ANGIOGRAPHY (CUSTOM PROCEDURE TRAY) ×3 IMPLANT
SHEATH ANL 5FRX90 (SHEATH) ×3 IMPLANT
SHEATH BRITE TIP 5FRX11 (SHEATH) ×3 IMPLANT
SYR MEDRAD MARK V 150ML (SYRINGE) ×3 IMPLANT
TOWEL OR 17X26 4PK STRL BLUE (TOWEL DISPOSABLE) ×3 IMPLANT
TUBING CONTRAST HIGH PRESS 72 (TUBING) ×3 IMPLANT
WIRE G V18X300CM (WIRE) ×6 IMPLANT
WIRE J 3MM .035X145CM (WIRE) ×3 IMPLANT

## 2018-04-20 NOTE — Op Note (Signed)
[facility]  Percutaneous Study/Intervention Procedural Note   Date of Surgery: 04/20/2018  Surgeon(s):Danyon Mcginness    Assistants:none  Pre-operative Diagnosis: PAD with ulceration bilateral lower extremities  Post-operative diagnosis:  Same  Procedure(s) Performed:             1.  Ultrasound guidance for vascular access left femoral artery             2.  Catheter placement into right SFA from left femoral approach             3.  Aortogram and selective right lower extremity angiogram             4.  Percutaneous transluminal angioplasty of right posterior tibial artery with 3 mm diameter by 30 cm length angioplasty balloon             5.   Percutaneous transluminal angioplasty of the right tibioperoneal trunk and popliteal artery with 4 mm diameter by 10 cm length Lutonix drug-coated angioplasty balloon  6.  Percutaneous transluminal angioplasty of the right anterior tibial artery from the foot up to the proximal anterior tibial artery with 3 inflations with a 2.5 mm diameter by 15 cm length angioplasty balloon             7.  StarClose closure device left femoral artery  EBL: 5 cc  Contrast: 40 cc  Fluoro Time: 9.5 minutes  Moderate Conscious Sedation Time: approximately 45 minutes using 2 mg of Versed and 100 Mcg of Fentanyl              Indications:  Patient is a 84 y.o.male with nonhealing ulcerations on both lower extremities with recent infections for cellulitis on both legs.  The right leg has a larger blister and is the more acute problem with infection currently.  The patient is brought in for angiography for further evaluation and potential treatment.  Due to the limb threatening nature of the situation, angiogram was performed for attempted limb salvage. The patient is aware that if the procedure fails, amputation would be expected.  The patient also understands that even with successful revascularization, amputation may still be required due to the  severity of the situation.  Risks and benefits are discussed and informed consent is obtained.   Procedure:  The patient was identified and appropriate procedural time out was performed.  The patient was then placed supine on the table and prepped and draped in the usual sterile fashion. Moderate conscious sedation was administered during a face to face encounter with the patient throughout the procedure with my supervision of the RN administering medicines and monitoring the patient's vital signs, pulse oximetry, telemetry and mental status throughout from the start of the procedure until the patient was taken to the recovery room. Ultrasound was used to evaluate the left common femoral artery.  It was patent .  A digital ultrasound image was acquired.  A Seldinger needle was used to access the left common femoral artery under direct ultrasound guidance and a permanent image was performed.  A 0.035 J wire was advanced without resistance and a 5Fr sheath was placed.  Pigtail catheter was placed into the aorta and an AP aortogram was performed. This demonstrated normal renal arteries and normal aorta and iliac segments without significant stenosis. I then crossed the aortic bifurcation with a rim catheter and an advantage wire and advanced to the right femoral head.  The catheter was then advanced into the proximal to mid right  superficial femoral artery to help opacify distally.  Selective right lower extremity angiogram was then performed. This demonstrated normal common femoral artery, profunda femoris artery, and superficial femoral artery which was not reasonably normal although it had some mild disease in the distal segment.  The popliteal artery in its distal segment and the tibioperoneal trunk had about an 80% stenosis.  The peroneal artery was continuous but did not go into the foot.  The posterior tibial artery had multiple areas of greater than 80% stenosis as well as occlusion in the distal segment.   The anterior tibial artery was diffusely diseased with multiple areas of occlusion but did reconstitute at the ankle into the foot. It was felt that it was in the patient's best interest to proceed with intervention after these images to avoid a second procedure and a larger amount of contrast and fluoroscopy based off of the findings from the initial angiogram. The patient was systemically heparinized and a 90 cm 5 Frl sheath was then placed over the Terumo Advantage wire. I then used a Kumpe catheter and the advantage wire to navigate across the popliteal and tibioperoneal trunk stenosis and get into the posterior tibial artery where selective imaging was performed.  I then exchanged for a 0.018 wire and parked this in the foot.  A 3 mm diameter by 30 cm length angioplasty balloon was inflated from the posterior tibial artery just above the ankle up to the proximal posterior tibial artery and inflated to 8 atm for 1 minute.  It was pulled back to get up into the tibioperoneal trunk and inflated to 10 atm for 1 minute.  Completion imaging showed less than 20% residual stenosis in the areas treated with only occlusion now in the foot.  I then turned my attention to the tibioperoneal trunk and popliteal lesion.  A 4 mm diameter by 10 cm length Lutonix drug-coated angioplasty balloon was inflated to 12 atm for 1 minute.  Completion imaging showed markedly improved flow with only about 20% residual stenosis in the popliteal artery and tibioperoneal trunk.  I then turned my attention to the anterior tibial artery.  Using the V 18 wire and a CXI catheter is able to get into the anterior tibial artery and confirm catheter placement.  I then exchanged for a 0.018 advantage wire and the small CXI catheter was able to cross all the occlusions and parked the wire into the foot.  I then used a 2.5 mm diameter by 15 cm length angioplasty balloon inflated from the foot up into the distal anterior tibial artery.  It was  sequentially pulled back with 2 additional inflations to treat the mid anterior tibial artery in the proximal anterior tibial artery.  The 2 more distal inflations were 8 atm for 1 minute and the proximal inflation was 10 atm for 1 minute.  Completion imaging showed in-line flow to the foot with less than 40% residual stenosis after angioplasty of the anterior tibial artery. I elected to terminate the procedure. The sheath was removed and StarClose closure device was deployed in the left femoral artery with excellent hemostatic result. The patient was taken to the recovery room in stable condition having tolerated the procedure well.  Findings:               Aortogram:  Normal renal arteries bilaterally.  The aorta and iliac arteries were widely patent without any significant stenosis although they were markedly tortuous.  Right lower Extremity:  Normal common femoral artery, profunda femoris artery, and superficial femoral artery which was not reasonably normal although it had some mild disease in the distal segment.  The popliteal artery in its distal segment and the tibioperoneal trunk had about an 80% stenosis.  The peroneal artery was continuous but did not go into the foot.  The posterior tibial artery had multiple areas of greater than 80% stenosis as well as occlusion in the distal segment.  The anterior tibial artery was diffusely diseased with multiple areas of occlusion but did reconstitute at the ankle into the foot.   Disposition: Patient was taken to the recovery room in stable condition having tolerated the procedure well.  Complications: None  Leotis Pain 04/20/2018 11:00 AM   This note was created with Dragon Medical transcription system. Any errors in dictation are purely unintentional.

## 2018-04-20 NOTE — Consult Note (Signed)
[facility] Vascular Consult Note  MRN : [id number]  [name] is a 84 y.o. (1931/12/11) male who presents with chief complaint of  Chief Complaint  Patient presents with  . Wound Infection  .  History of Present Illness: I am asked to see the patient by Dr. Anselm Jungling for evaluation of lower extremity ulcerations and infection.  He had a small blister on the right medial lower leg which has dramatically worsened over the past several days and is now about 6 to 7 cm.  He also has several scabs present in both calves and lower legs.  Was complaining of worsening pain in the right leg as well.  No clear inciting event or causative factor that started the symptoms.  Has been going on for days 2 weeks now.  He had left leg cellulitis last month at [facility].  He is a poor historian.  Current Facility-Administered Medications  Medication Dose Route Frequency Provider Last Rate Last Dose  . [MAR Hold] 0.9 %  sodium chloride infusion   Intravenous PRN Max Sane, MD   Stopped at 04/20/18 0104  . 0.9 %  sodium chloride infusion   Intravenous Continuous , Erskine Squibb, MD      . Doug Sou Hold] acetaminophen (TYLENOL) tablet 650 mg  650 mg Oral Q6H PRN Arta Silence, MD      . Doug Sou Hold] amLODipine (NORVASC) tablet 10 mg  10 mg Oral Daily Vaughan Basta, MD      . Doug Sou Hold] cefTRIAXone (ROCEPHIN) 1 g in sodium chloride 0.9 % 100 mL IVPB  1 g Intravenous Q24H Vaughan Basta, MD   Stopped at 04/20/18 0051  . [MAR Hold] Chlorhexidine Gluconate Cloth 2 % PADS 6 each  6 each Topical Q0600 Vaughan Basta, MD   6 each at 04/20/18 0622  . clindamycin (CLEOCIN) 300 MG/50ML IVPB           . [MAR Hold] cloNIDine (CATAPRES - Dosed in mg/24 hr) patch 0.2 mg  0.2 mg Transdermal Q Artemio Aly, Rosalio Macadamia, MD   0.2 mg at 04/19/18 2123  . [MAR Hold] docusate sodium (COLACE) capsule 100 mg  100 mg Oral BID PRN Vaughan Basta, MD      . Doug Sou Hold] dutasteride  (AVODART) capsule 0.5 mg  0.5 mg Oral Daily Vaughan Basta, MD   0.5 mg at 04/19/18 2123  . [MAR Hold] furosemide (LASIX) tablet 40 mg  40 mg Oral Daily Vaughan Basta, MD      . Doug Sou Hold] heparin injection 5,000 Units  5,000 Units Subcutaneous Q8H Vaughan Basta, MD   5,000 Units at 04/20/18 [phone]  . [MAR Hold] insulin aspart (novoLOG) injection 0-9 Units  0-9 Units Subcutaneous TID WC Vaughan Basta, MD   1 Units at 04/20/18 0805  . [MAR Hold] labetalol (NORMODYNE) tablet 100 mg  100 mg Oral Daily Vaughan Basta, MD      . Doug Sou Hold] multivitamin with minerals tablet 1 tablet  1 tablet Oral Daily Vaughan Basta, MD      . Doug Sou Hold] mupirocin ointment (BACTROBAN) 2 % 1 application  1 application Nasal BID Vaughan Basta, MD   1 application at 76/73/41 0106  . [MAR Hold] tamsulosin (FLOMAX) capsule 0.4 mg  0.4 mg Oral Daily Vaughan Basta, MD   0.4 mg at 04/19/18 2123  . [MAR Hold] vancomycin (VANCOCIN) IVPB 1000 mg/200 mL premix  1,000 mg Intravenous Q48H Shanlever, Pierce Crane, [facility]        Past Medical History:  Diagnosis Date  . Cancer ([place])   . Chronic kidney disease   . Colon polyp   . Diabetes mellitus without complication ([place])   . Heart murmur 2008  . Hyperlipidemia   . Hypertension 1980  . Retinal detachment     Past Surgical History:  Procedure Laterality Date  . COLONOSCOPY  2011,06/23/13   Dr Bary Castilla  . COLONOSCOPY  06-28-2011  . COLONOSCOPY WITH PROPOFOL N/A 08/30/2015   Procedure: COLONOSCOPY WITH PROPOFOL;  Surgeon: [name], MD;  Location: [facility] ENDOSCOPY;  Service: Endoscopy;  Laterality: N/A;  . EYE SURGERY    . HERNIA REPAIR    . POLYPECTOMY  06-23-13  . ]      Social History Social History   Tobacco Use  . Smoking status: Never Smoker  . Smokeless tobacco: Never Used  Substance Use Topics  . Alcohol use: No  . Drug use: No    Family History No family history on file and the patient is  really unable to provide any additional history due to his dementia and poor historian status.  Allergies  Allergen Reactions  . Penicillins Rash     REVIEW OF SYSTEMS (Negative unless checked) Patient unable to provide reliable review of systems with his advanced dementia and poor historian status.  No family immediately available to provide either.  Physical Examination  Vitals:   04/19/18 1948 04/20/18 0253 04/20/18 0559 04/20/18 0820  BP: (!) 159/64  (!) 161/67 (!) 154/75  Pulse: 75  68 63  Resp: 18  16 17   Temp: 100 F (37.8 C) 99.4 F (37.4 C) 99.3 F (37.4 C)   TempSrc: Oral Oral Oral Oral  SpO2: 100%  99% 98%  Weight:    68 kg  Height:    5\' 8"  (1.727 m)   Body mass index is 22.81 kg/m. Gen:  WD/WN, NAD Head: [place]/AT, No temporalis wasting.  Ear/Nose/Throat: Hearing grossly intact, nares w/o erythema or drainage, oropharynx w/o Erythema/Exudate Eyes: Sclera non-icteric, conjunctiva clear Neck: Trachea midline.  No JVD.  Pulmonary:  Good air movement, respirations not labored, equal bilaterally.  Cardiac: RRR, no JVD Vascular:  Vessel Right Left  Radial Palpable Palpable                          PT Not Palpable 1+ Palpable  DP Trace Palpable Trace Palpable   Gastrointestinal: soft, non-tender/non-distended. No guarding/reflex.  Musculoskeletal: M/S 5/5 throughout.  Wounds as below.  No deformity or atrophy. No edema. Neurologic: Sensation grossly intact in extremities.  Symmetrical.  Speech is fluent. Motor exam as listed above. Psychiatric: Judgment and insight are poor secondary to dementia.  Poor historian Dermatologic: Roughly 7 cm blister on the medial right lower leg.  Scattered small superficial scabs and ulcerations are present in the calves and lower legs bilaterally.  Mild erythema is present on the right with no current erythema on the left.      CBC Lab Results  Component Value Date   WBC 11.2 (H) 04/20/2018   HGB 10.6 (L) 04/20/2018    HCT 32.4 (L) 04/20/2018   MCV 94.7 04/20/2018   PLT 154 04/20/2018    BMET    Component Value Date/Time   NA 141 04/20/2018 0435   NA 136 07/02/2013 0414   K 4.3 04/20/2018 0435   K 4.3 07/02/2013 0414   CL 113 (H) 04/20/2018 0435   CL 107 07/02/2013 0414   CO2 21 (L) 04/20/2018 6295  CO2 24 07/02/2013 0414   GLUCOSE 130 (H) 04/20/2018 0435   GLUCOSE 166 (H) 07/02/2013 0414   BUN 51 (H) 04/20/2018 0435   BUN 43 (H) 07/02/2013 0414   CREATININE 2.52 (H) 04/20/2018 0435   CREATININE 1.74 (H) 07/02/2013 0414   CALCIUM 8.6 (L) 04/20/2018 0435   CALCIUM 7.8 (L) 07/02/2013 0414   GFRNONAA 22 (L) 04/20/2018 0435   GFRNONAA 36 (L) 07/02/2013 0414   GFRAA 25 (L) 04/20/2018 0435   GFRAA 42 (L) 07/02/2013 0414   Estimated Creatinine Clearance: 21 mL/min (A) (by C-G formula based on SCr of 2.52 mg/dL (H)).  COAG Lab Results  Component Value Date   INR 1.1 07/01/2013    Radiology Dg Tibia/fibula Right  Result Date: 04/19/2018 CLINICAL DATA:  84 y/o M; soreness, redness, and drainage from the right lower leg. EXAM: RIGHT TIBIA AND FIBULA - 2 VIEW COMPARISON:  None. FINDINGS: There is no evidence of fracture or other focal bone lesions. Bones are demineralized. Vascular calcifications noted. IMPRESSION: No findings of osteomyelitis.  No acute fracture or dislocation. Electronically Signed   By: Kristine Garbe M.D.   On: 04/19/2018 17:12   US Venous Img Lower Unilateral Left  Result Date: 04/02/2018 CLINICAL DATA:  Left leg swelling for 1 week with discoloration EXAM: LEFT LOWER EXTREMITY VENOUS DOPPLER ULTRASOUND TECHNIQUE: Gray-scale sonography with graded compression, as well as color Doppler and duplex ultrasound were performed to evaluate the lower extremity deep venous systems from the level of the common femoral vein and including the common femoral, femoral, profunda femoral, popliteal and calf veins including the posterior tibial, peroneal and gastrocnemius veins  when visible. The superficial great saphenous vein was also interrogated. Spectral Doppler was utilized to evaluate flow at rest and with distal augmentation maneuvers in the common femoral, femoral and popliteal veins. COMPARISON:  None. FINDINGS: Contralateral Common Femoral Vein: Respiratory phasicity is normal and symmetric with the symptomatic side. No evidence of thrombus. Normal compressibility. Common Femoral Vein: No evidence of thrombus. Normal compressibility, respiratory phasicity and response to augmentation. Saphenofemoral Junction: No evidence of thrombus. Normal compressibility and flow on color Doppler imaging. Profunda Femoral Vein: No evidence of thrombus. Normal compressibility and flow on color Doppler imaging. Femoral Vein: No evidence of thrombus. Normal compressibility, respiratory phasicity and response to augmentation. Popliteal Vein: No evidence of thrombus. Normal compressibility, respiratory phasicity and response to augmentation. Calf Veins: No evidence of thrombus. Normal compressibility and flow on color Doppler imaging. Superficial Great Saphenous Vein: No evidence of thrombus. Normal compressibility. Venous Reflux:  None. Other Findings:  None. IMPRESSION: No evidence of deep venous thrombosis. Electronically Signed   By: Jerilynn Mages.  Shick M.D.   On: 04/02/2018 09:15      Assessment/Plan 1.  Ulcerations with infection right lower extremity and suspected peripheral arterial disease.  He also has scabs on the left lower extremity with suspected peripheral arterial disease.  This is a critical and limb threatening situation.  An angiogram should be performed for evaluation of his right lower extremity perfusion.  His left lower extremity is a less immediately critical situation and we may be able to evaluate this as an outpatient.  We have brought him down to try to do this today but have been unable to reach his daughter-in-law who is the power of attorney.  We will try to get in touch  with her to discuss the situation and see if she would like to proceed. 2.  Chronic kidney disease.  Stage IV.  Will need significant hydration with the procedure and limitations of contrast. 3.  Diabetes mellitus.  Stable on outpatient medications and blood glucose control important in reducing the progression of atherosclerotic disease. Also, involved in wound healing. On appropriate medications. 4.  Hypertension. Stable on outpatient medication and blood pressure control important in reducing the progression of atherosclerotic disease. On appropriate oral medications.    Leotis Pain, MD  04/20/2018 8:48 AM    This note was created with Dragon medical transcription system.  Any error is purely unintentional

## 2018-04-20 NOTE — OR Nursing (Signed)
Pt alert and oriented x 4 , pt achknowledges the angiogram with possible intervention. He signed his own consent. Dr Lucky Cowboy unsuccessfully called daughter inlaw to discuss procedure and get consent but was satisfied that pt was able to sign own consent to proceed with procedure

## 2018-04-20 NOTE — Consult Note (Signed)
[facility] Nurse wound consult note Reason for Consult:Severe PAD with angiogram today.  Necrotic vascular wounds to lower legs.  Wound type:vascular Pressure Injury POA: NA Measurement:Right anterior lower leg with blood filled blister. Scattered dry scabbed lesions to lower legs.  Toes and heels are intact.  Wound YNX:GZFPOIP Drainage (amount, consistency, odor) none Periwound:dry skin, cool to touch Dressing procedure/placement/frequency:OPen to air. If blood blister ruptures begin foam dressing.  Will not follow at this time.  Please re-consult if needed.  Domenic Moras MSN, RN, FNP-BC CWON Wound, Ostomy, Continence Nurse Pager [phone]

## 2018-04-20 NOTE — Progress Notes (Signed)
Patient recently returned from vascular lab

## 2018-04-20 NOTE — Progress Notes (Signed)
Patient left for vascular procedure

## 2018-04-20 NOTE — OR Nursing (Signed)
Phone consent obtained from daughter in law by Dr Lucky Cowboy

## 2018-04-20 NOTE — Progress Notes (Signed)
[facility] at [place] NAME: [name]    MR#:  [id number]  DATE OF BIRTH:  11/17/1931  SUBJECTIVE:  CHIEF COMPLAINT:   Chief Complaint  Patient presents with  . Wound Infection  seen in vascular recovery area. No complaints REVIEW OF SYSTEMS:  Review of Systems  Constitutional: Negative for chills, fever and weight loss.  HENT: Negative for nosebleeds and sore throat.   Eyes: Negative for blurred vision.  Respiratory: Negative for cough, shortness of breath and wheezing.   Cardiovascular: Negative for chest pain, orthopnea, leg swelling and PND.  Gastrointestinal: Negative for abdominal pain, constipation, diarrhea, heartburn, nausea and vomiting.  Genitourinary: Negative for dysuria and urgency.  Musculoskeletal: Positive for joint pain. Negative for back pain.  Skin: Negative for rash.  Neurological: Negative for dizziness, speech change, focal weakness and headaches.  Endo/Heme/Allergies: Does not bruise/bleed easily.  Psychiatric/Behavioral: Negative for depression.   DRUG ALLERGIES:   Allergies  Allergen Reactions  . Penicillins Rash   VITALS:  Blood pressure (!) 149/61, pulse (!) 55, temperature 97.7 F (36.5 C), temperature source Oral, resp. rate 16, height 5\' 8"  (1.727 m), weight 68 kg, SpO2 100 %. PHYSICAL EXAMINATION:  Physical Exam  Constitutional: He is oriented to person, place, and time.  HENT:  Head: Normocephalic and atraumatic.  Eyes: Pupils are equal, round, and reactive to light. Conjunctivae and EOM are normal.  Neck: Normal range of motion. Neck supple. No tracheal deviation present. No thyromegaly present.  Cardiovascular: Normal rate, regular rhythm and normal heart sounds.  Pulmonary/Chest: Effort normal and breath sounds normal. No respiratory distress. He has no wheezes. He exhibits no tenderness.  Abdominal: Soft. Bowel sounds are normal. He exhibits no distension. There is no tenderness.    Musculoskeletal: Normal range of motion.  Neurological: He is alert and oriented to person, place, and time. No cranial nerve deficit.  Skin: Skin is warm and dry. Rash noted.  Both legs have dark-colored skin with some ulcers as a sign of peripheral vascular disease.  On the left lower calf on the posterior side he had a small ulcer.  On the right leg medial aspect he had 5 cm x 4 cm size yellowish discharge filled blister.  Skin over the blister is still intact, no deep tissue exposed.    LABORATORY PANEL:  Male CBC Recent Labs  Lab 04/20/18 0435  WBC 11.2*  HGB 10.6*  HCT 32.4*  PLT 154   ------------------------------------------------------------------------------------------------------------------ Chemistries  Recent Labs  Lab 04/20/18 0435  NA 141  K 4.3  CL 113*  CO2 21*  GLUCOSE 130*  BUN 51*  CREATININE 2.52*  CALCIUM 8.6*   RADIOLOGY:  Dg Tibia/fibula Right  Result Date: 04/19/2018 CLINICAL DATA:  84 y/o M; soreness, redness, and drainage from the right lower leg. EXAM: RIGHT TIBIA AND FIBULA - 2 VIEW COMPARISON:  None. FINDINGS: There is no evidence of fracture or other focal bone lesions. Bones are demineralized. Vascular calcifications noted. IMPRESSION: No findings of osteomyelitis.  No acute fracture or dislocation. Electronically Signed   By: Kristine Garbe M.D.   On: 04/19/2018 17:12   ASSESSMENT AND PLAN:   *Cellulitis in diabetic patient - continue IV rocephin and Vanco - c/s wound care nurse  *Atherosclerosis of peripheral arteries of both legs along with ulcer and infection secondary to diabetes - vascular planning Angio today  *Diabetes mellitus with complications of peripheral bilateral disease - hold oral medication and keep on sliding  scale coverage for Angio.  *Chronic kidney disease stage IV Appears stable, continue to monitor with IV antibiotics.  *Hypertension Continue amlodipine, clonidine, furosemide,  labetalol     All the records are reviewed and case discussed with Care Management/Social Worker. Management plans discussed with the patient, nursing and they are in agreement.  CODE STATUS: DNR  TOTAL TIME TAKING CARE OF THIS PATIENT: 25 minutes.   More than 50% of the time was spent in counseling/coordination of care: YES  POSSIBLE D/C IN 1-2 DAYS, DEPENDING ON CLINICAL CONDITION.   Max Sane M.D on 04/20/2018 at 2:10 PM  Between 7am to 6pm - Pager - [phone]  After 6pm go to [email or web address] - Proofreader  [facility]  Office  [phone]  CC: Primary care physician; Marguerita Merles, MD  Note: This dictation was prepared with Dragon dictation along with smaller phrase technology. Any transcriptional errors that result from this process are unintentional.

## 2018-04-20 NOTE — Progress Notes (Signed)
Dr. Lucky Cowboy at bedside, speaking with pt. Re: procedural results.Pt. Coherent, verbalizing understanding of results at present.

## 2018-04-20 NOTE — Progress Notes (Signed)
IVF had expired.  Dr Manuella Ghazi said to continue LR at 15ml/hr

## 2018-04-21 ENCOUNTER — Encounter: Payer: Self-pay | Admitting: Vascular Surgery

## 2018-04-21 DIAGNOSIS — I70209 Unspecified atherosclerosis of native arteries of extremities, unspecified extremity: Secondary | ICD-10-CM

## 2018-04-21 LAB — BASIC METABOLIC PANEL
ANION GAP: 8 (ref 5–15)
BUN: 56 mg/dL — ABNORMAL HIGH (ref 8–23)
CALCIUM: 8.5 mg/dL — AB (ref 8.9–10.3)
CO2: 20 mmol/L — ABNORMAL LOW (ref 22–32)
Chloride: 108 mmol/L (ref 98–111)
Creatinine, Ser: 2.75 mg/dL — ABNORMAL HIGH (ref 0.61–1.24)
GFR calc Af Amer: 23 mL/min — ABNORMAL LOW (ref >60)
GFR, EST NON AFRICAN AMERICAN: 20 mL/min — AB (ref >60)
GLUCOSE: 207 mg/dL — AB (ref 70–99)
POTASSIUM: 4.4 mmol/L (ref 3.5–5.1)
Sodium: 136 mmol/L (ref 135–145)

## 2018-04-21 LAB — CBC
HEMATOCRIT: 29.7 % — AB (ref 39.0–52.0)
Hemoglobin: 9.9 g/dL — ABNORMAL LOW (ref 13.0–17.0)
MCH: 31.6 pg (ref 26.0–34.0)
MCHC: 33.3 g/dL (ref 30.0–36.0)
MCV: 94.9 fL (ref 80.0–100.0)
NRBC: 0 % (ref 0.0–0.2)
PLATELETS: 162 10*3/uL (ref 150–400)
RBC: 3.13 MIL/uL — AB (ref 4.22–5.81)
RDW: 12.4 % (ref 11.5–15.5)
WBC: 10.2 10*3/uL (ref 4.0–10.5)

## 2018-04-21 LAB — GLUCOSE, CAPILLARY
Glucose-Capillary: 149 mg/dL — ABNORMAL HIGH (ref 70–99)
Glucose-Capillary: 145 mg/dL — ABNORMAL HIGH (ref 70–99)

## 2018-04-21 MED ORDER — CLINDAMYCIN HCL 300 MG PO CAPS: 300.0000 mg | ORAL_CAPSULE | Freq: Three times a day (TID) | ORAL | 0 refills | Status: AC

## 2018-04-21 MED ORDER — CLOPIDOGREL BISULFATE 75 MG PO TABS: 75.0000 mg | ORAL_TABLET | Freq: Every day | ORAL | 0 refills | Status: DC

## 2018-04-21 MED ORDER — ATORVASTATIN CALCIUM 10 MG PO TABS: 10.0000 mg | ORAL_TABLET | Freq: Every day | ORAL | 0 refills | Status: DC

## 2018-04-21 MED ORDER — ASPIRIN 81 MG PO TBEC: 81.0000 mg | DELAYED_RELEASE_TABLET | Freq: Every day | ORAL | 0 refills | Status: AC

## 2018-04-21 NOTE — Progress Notes (Signed)
[facility]  Daily Progress Note   Subjective: 1 Day Post-Op: Ultrasound guidance for vascular access left femoral artery, Catheter placement into right SFA from left femoral approach, Aortogram and selective right lower extremity angiogram, Percutaneous transluminal angioplasty of right posterior tibial artery with 3 mm diameter by 30 cm length angioplasty balloon, Percutaneous transluminal angioplasty of the right tibioperoneal trunk and popliteal artery with 4 mm diameter by 10 cm length Lutonix drug-coated angioplasty balloon, Percutaneous transluminal angioplasty of the right anterior tibial artery from the foot up to the proximal anterior tibial artery with 3 inflations with a 2.5 mm diameter by 15 cm length angioplasty balloon with StarClose closure device left femoral artery.  Patient with continued right lower extremity discomfort.  Seen by wound nurse yesterday.  Objective: Vitals:   04/20/18 1228 04/20/18 2044 04/21/18 0800 04/21/18 1305  BP: (!) 149/61 138/60 (!) 143/62 (!) 132/58  Pulse: (!) 55 71 (!) 58 (!) 58  Resp: 16 20  20   Temp: 97.7 F (36.5 C) 99.5 F (37.5 C) 98.8 F (37.1 C) 98.6 F (37 C)  TempSrc: Oral Oral Oral Oral  SpO2: 100% 98% 100% 100%  Weight:      Height:        Intake/Output Summary (Last 24 hours) at 04/21/2018 1315 Last data filed at 04/21/2018 1233 Gross per 24 hour  Intake 1123.61 ml  Output 1925 ml  Net -801.39 ml   Physical Exam: A&Ox3, NAD CV: RRR Pulmonary: CTA Bilaterally Abdomen: Soft, Nontender, Nondistended Left Groin: Procedure dressing removed. Incision clean, dry and intact. No swelling, drainage or ecchymosis.  Vascular:  Right Lower Extremity: Thigh soft, calf soft. Large intact blood blister noted on calf. Extremity warm distally to toes.    Laboratory: CBC    Component Value Date/Time   WBC 10.2 04/21/2018 0346   HGB 9.9 (L) 04/21/2018 0346   HGB 9.4 (L) 07/02/2013 1232   HCT 29.7 (L)  04/21/2018 0346   HCT 21.9 (L) 07/02/2013 0414   PLT 162 04/21/2018 0346   PLT 122 (L) 07/02/2013 0414   BMET    Component Value Date/Time   NA 136 04/21/2018 0346   NA 136 07/02/2013 0414   K 4.4 04/21/2018 0346   K 4.3 07/02/2013 0414   CL 108 04/21/2018 0346   CL 107 07/02/2013 0414   CO2 20 (L) 04/21/2018 0346   CO2 24 07/02/2013 0414   GLUCOSE 207 (H) 04/21/2018 0346   GLUCOSE 166 (H) 07/02/2013 0414   BUN 56 (H) 04/21/2018 0346   BUN 43 (H) 07/02/2013 0414   CREATININE 2.75 (H) 04/21/2018 0346   CREATININE 1.74 (H) 07/02/2013 0414   CALCIUM 8.5 (L) 04/21/2018 0346   CALCIUM 7.8 (L) 07/02/2013 0414   GFRNONAA 20 (L) 04/21/2018 0346   GFRNONAA 36 (L) 07/02/2013 0414   GFRAA 23 (L) 04/21/2018 0346   GFRAA 42 (L) 07/02/2013 0414   Assessment/Planning: 84 year old male with past medical history of peripheral artery disease status post a right lower extremity angiogram with intervention postop day 1 - Stable 1) Would consider three layer zinc oxide unna wrap to the right lower extremity for wound care / edema control now that arterial patency has been restored.  I had a long discussion with the patient about reperfusion syndrome and how the symptoms can last for up to two months however they should slowly improve. 2) Plan for left lower extremity angiogram with possible intervention. Due to renal function and the need for contrast  during the angiogram would wait at least a week - earliest would be Monday / Wed of next week with Dr. Lucky Cowboy.  The patient does not need to stay inpatient for this.  This can be planned as an outpatient.  I have discussed this plan with the patient as he and his agreement. 3) On ASA, Plavix and statin for atherosclerotic disease.   Discussed with Dr. Ellis Parents Coila Wardell PA-C 04/21/2018 1:15 PM

## 2018-04-21 NOTE — Care Management Note (Signed)
Case Management Note  Patient Details  Name: [name] MRN: [id number] Date of Birth: [birth year]   Patient to discharge today with resumption of home health orders through [facility].  Corene Cornea with [facility] notified of discharge.  Patient lives at home alone.  Son and Daughter live locally, and are available to provide transportation when needed. Patient states that he has a RW in the home, but feels that he would benefit from [facility].  Patient's son to pick up at the [employer] store after discharge. Patient obtains his medications through mail order, and denies any issues obtaining medications.  RNCM signing off  Subjective/Objective:                    Action/Plan:   Expected Discharge Date:  04/21/18               Expected Discharge Plan:  [place]  In-House Referral:     Discharge planning Services  CM Consult  Post Acute Care Choice:  Resumption of Svcs/PTA Provider, Home Health Choice offered to:  Patient  DME Arranged:  Bedside commode DME Agency:  [facility] Arranged:  RN, PT [facility] Agency:  [facility]  Status of Service:  Completed, signed off  If discussed at [place] of Stay Meetings, dates discussed:    Additional Comments:  Beverly Sessions, RN 04/21/2018, 3:50 PM

## 2018-04-21 NOTE — Discharge Instructions (Signed)

## 2018-04-22 ENCOUNTER — Encounter (INDEPENDENT_AMBULATORY_CARE_PROVIDER_SITE_OTHER): Payer: Self-pay

## 2018-04-22 ENCOUNTER — Telehealth (INDEPENDENT_AMBULATORY_CARE_PROVIDER_SITE_OTHER): Payer: Self-pay

## 2018-04-22 LAB — AEROBIC CULTURE  (SUPERFICIAL SPECIMEN)
GRAM STAIN: NONE SEEN
SPECIAL REQUESTS: NORMAL

## 2018-04-22 LAB — AEROBIC CULTURE W GRAM STAIN (SUPERFICIAL SPECIMEN)

## 2018-04-22 NOTE — Telephone Encounter (Signed)
I called the patient and per the patient he wanted me to call his son to give him the information regarding the angio. After speaking with the son and explaining that I was asked to schedule his father as soon as possible, the son agreed on keeping the 04/27/18 procedure appt and 04/24/18 pre-op appt. I received a message later to cancel the appts and speak with a [name].

## 2018-04-22 NOTE — Telephone Encounter (Signed)
I spoke with the patient's daughter in law and she stated Dr. Lucky Cowboy wanted them to wait 2 weeks before scheduling his leg angio. I asked that they call me back to get the patient scheduled for his leg angio.

## 2018-04-24 ENCOUNTER — Inpatient Hospital Stay: Admit: 2018-04-24 | Payer: PRIVATE HEALTH INSURANCE

## 2018-04-24 NOTE — Discharge Summary (Signed)
[facility] NAME: [name]    MR#:  [id number]  DATE OF BIRTH:  March 11, 1932  DATE OF ADMISSION:  04/19/2018   ADMITTING PHYSICIAN: Vaughan Basta, MD  DATE OF DISCHARGE: 04/21/2018  5:06 PM  PRIMARY CARE PHYSICIAN: Marguerita Merles, MD   ADMISSION DIAGNOSIS:  Leg Sore DISCHARGE DIAGNOSIS:  Principal Problem:   Cellulitis  SECONDARY DIAGNOSIS:   Past Medical History:  Diagnosis Date  . Cancer ([place])   . Chronic kidney disease   . Colon polyp   . Diabetes mellitus without complication ([place])   . Heart murmur 2008  . Hyperlipidemia   . Hypertension 1980  . Retinal detachment    HOSPITAL COURSE:  *MRSA Cellulitis in diabetic patient - improving with Abx  *Atherosclerosis of peripheral arteries of both legs along with ulcer and infection secondary to diabetes - Severe PAD s/p angiogram and intervention to help perfusion.  Necrotic vascular wounds to lower legs.  Wound type:vascular Pressure Injury POA: NA Measurement:Right anterior lower leg with blood filled blister. Scattered dry scabbed lesions to lower legs.  Toes and heels are intact.  Wound DJM:EQASTMH Drainage (amount, consistency, odor) none Periwound:dry skin, cool to touch Dressing procedure/placement/frequency:Open to air. If blood blister ruptures begin foam dressing.   - Vascular recommends three layer zinc oxide unna wrap to the right lower extremity for wound care / edema control now that arterial patency has been restored. Vascular had a long discussion with the patient about reperfusion syndrome and how the symptoms can last for up to two months however they should slowly improve. - Vascular Planning for left lower extremity angiogram with possible intervention as an outpt.   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 Penicillins Rash   DISCHARGE MEDICATIONS:   Allergies as of 04/21/2018      Reactions   Penicillins Rash      Medication List    STOP taking these medications   polyethylene glycol powder powder Commonly known as:  GLYCOLAX/MIRALAX     TAKE these medications   amLODipine 10 MG tablet Commonly known as:  NORVASC Take 10 mg by mouth daily.   aspirin 81 MG EC tablet Take 1 tablet (81 mg total) by mouth daily.   atorvastatin 10 MG tablet Commonly known as:  LIPITOR Take 1 tablet (10 mg total) by mouth daily at 6 PM.   clindamycin 300 MG capsule Commonly known as:  CLEOCIN Take 1 capsule (300 mg total) by mouth 3 (three) times daily for 7 days.   cloNIDine 0.2 mg/24hr patch Commonly known as:  CATAPRES - Dosed in mg/24 hr Place 0.2 mg onto the skin every Sunday.   clopidogrel 75 MG tablet Commonly known as:  PLAVIX Take 1 tablet (75 mg total) by mouth daily.   furosemide 40 MG tablet Commonly known as:  LASIX Take 40 mg by mouth daily.   GLIPIZIDE XL 2.5 MG 24 hr tablet Generic drug:  glipiZIDE Take 2.5 mg by mouth daily.   JALYN 0.5-0.4 MG Caps Generic drug:  Dutasteride-Tamsulosin HCl Take 1 capsule by mouth daily.   labetalol 200 MG tablet Commonly known as:  NORMODYNE Take 100  mg by mouth daily.   multivitamin capsule Take 1 capsule by mouth daily.        DISCHARGE INSTRUCTIONS:  three layer zinc oxide unna wrap to the right lower extremity for wound care / edema control  DIET:  Regular diet DISCHARGE CONDITION:  Good ACTIVITY:  Activity as tolerated OXYGEN:  Home Oxygen: No.  Oxygen Delivery: room air DISCHARGE LOCATION:  home with home health  If you experience worsening of your admission symptoms,  develop shortness of breath, life threatening emergency, suicidal or homicidal thoughts you must seek medical attention immediately by calling 911 or calling your MD immediately  if symptoms less severe.  You Must read complete instructions/literature along with all the possible adverse reactions/side effects for all the Medicines you take and that have been prescribed to you. Take any new Medicines after you have completely understood and accpet all the possible adverse reactions/side effects.   Please note  You were cared for by a hospitalist during your hospital stay. If you have any questions about your discharge medications or the care you received while you were in the hospital after you are discharged, you can call the unit and asked to speak with the hospitalist on call if the hospitalist that took care of you is not available. Once you are discharged, your primary care physician will handle any further medical issues. Please note that NO REFILLS for any discharge medications will be authorized once you are discharged, as it is imperative that you return to your primary care physician (or establish a relationship with a primary care physician if you do not have one) for your aftercare needs so that they can reassess your need for medications and monitor your lab values.    On the day of Discharge:  VITAL SIGNS:  Blood pressure (!) 132/58, pulse (!) 58, temperature 98.6 F (37 C), temperature source Oral, resp. rate 20, height 5\' 8"  (1.727 m), weight 68 kg, SpO2 100 %. PHYSICAL EXAMINATION:  GENERAL:  84 y.o.-year-old patient lying in the bed with no acute distress.  EYES: Pupils equal, round, reactive to light and accommodation. No scleral icterus. Extraocular muscles intact.  HEENT: Head atraumatic, normocephalic. Oropharynx and nasopharynx clear.  NECK:  Supple, no jugular venous distention. No thyroid enlargement, no tenderness.  LUNGS: Normal breath sounds bilaterally, no wheezing,  rales,rhonchi or crepitation. No use of accessory muscles of respiration.  CARDIOVASCULAR: S1, S2 normal. No murmurs, rubs, or gallops.  ABDOMEN: Soft, non-tender, non-distended. Bowel sounds present. No organomegaly or mass.  EXTREMITIES: No pedal edema, cyanosis, or clubbing.  NEUROLOGIC: Cranial nerves II through XII are intact. Muscle strength 5/5 in all extremities. Sensation intact. Gait not checked.  PSYCHIATRIC: The patient is alert and oriented x 3.  SKIN: No obvious rash, lesion, or ulcer.  DATA REVIEW:   CBC Recent Labs  Lab 04/21/18 0346  WBC 10.2  HGB 9.9*  HCT 29.7*  PLT 162    Chemistries  Recent Labs  Lab 04/21/18 0346  NA 136  K 4.4  CL 108  CO2 20*  GLUCOSE 207*  BUN 56*  CREATININE 2.75*  CALCIUM 8.5*     Microbiology Results  Results for orders placed or performed during the hospital encounter of 04/19/18  Wound or Superficial Culture     Status: None   Collection Time: 04/19/18  4:27 PM  Result Value Ref Range Status   Specimen Description   Final    WOUND Performed at [facility], [address] [place], [place] [address]    Special Requests   Final    Normal Performed at [facility], [address] [place], [place] [address]    Gram Stain   Final    NO WBC SEEN NO ORGANISMS SEEN Performed at [facility], [place] [address], [place], [place] [address]    Culture   Final    MODERATE METHICILLIN RESISTANT STAPHYLOCOCCUS AUREUS   Report Status 04/22/2018 FINAL  Final   Organism ID, Bacteria METHICILLIN RESISTANT STAPHYLOCOCCUS AUREUS  Final      Susceptibility   Methicillin resistant staphylococcus aureus - MIC*    CIPROFLOXACIN >=8 RESISTANT Resistant     ERYTHROMYCIN >=8 RESISTANT Resistant     GENTAMICIN <=0.5 SENSITIVE Sensitive     OXACILLIN >=4 RESISTANT Resistant     TETRACYCLINE >=16 RESISTANT Resistant     VANCOMYCIN <=0.5 SENSITIVE Sensitive     TRIMETH/SULFA <=10 SENSITIVE Sensitive      CLINDAMYCIN <=0.25 SENSITIVE Sensitive     RIFAMPIN <=0.5 SENSITIVE Sensitive     Inducible Clindamycin NEGATIVE Sensitive     * MODERATE METHICILLIN RESISTANT STAPHYLOCOCCUS AUREUS  MRSA PCR Screening     Status: Abnormal   Collection Time: 04/19/18  8:58 PM  Result Value Ref Range Status   MRSA by PCR POSITIVE (A) NEGATIVE Final    Comment:        The GeneXpert MRSA Assay (FDA approved for NASAL specimens only), is one component of a comprehensive MRSA colonization surveillance program. It is not intended to diagnose MRSA infection nor to guide or monitor treatment for MRSA infections. RESULT CALLED TO, READ BACK BY AND VERIFIED WITH: Barrett Shell ON 04/19/18 AT [address] [facility] Performed at [facility], [address] [place], [place] [address]     RADIOLOGY:  No results found.   Management plans discussed with the patient, family and they are in agreement.  CODE STATUS: Prior   TOTAL TIME TAKING CARE OF THIS PATIENT: 45 minutes.    Max Sane M.D on 04/24/2018 at 9:42 AM  Between 7am to 6pm - Pager - [phone]  After 6pm go to [email or web address] - Proofreader  [facility]  Office  [phone]  CC: Primary care physician; Marguerita Merles, MD   Note: This dictation was prepared with Dragon dictation along with smaller phrase technology. Any transcriptional errors that result from this process are unintentional.

## 2018-04-27 ENCOUNTER — Ambulatory Visit: Admission: RE | Admit: 2018-04-27 | Payer: Medicare Other | Source: Ambulatory Visit | Admitting: Vascular Surgery

## 2018-04-27 ENCOUNTER — Encounter: Admission: RE | Payer: Self-pay | Source: Ambulatory Visit

## 2018-04-27 SURGERY — LOWER EXTREMITY ANGIOGRAPHY
Anesthesia: Moderate Sedation | Laterality: Left

## 2018-04-28 ENCOUNTER — Telehealth: Payer: Self-pay

## 2018-04-28 ENCOUNTER — Encounter (INDEPENDENT_AMBULATORY_CARE_PROVIDER_SITE_OTHER): Payer: Medicare Other | Admitting: Nurse Practitioner

## 2018-04-28 NOTE — Telephone Encounter (Signed)
Flagged on EMMI report for not knowing who to call about changes in condition and being unsure if he received new prescriptions.  Called and spoke with patient. Reviewed AVS and encouraged him to reach out to Dr. Lennox Grumbles or Dr Bunnie Domino office should he have questions.  He is also receiving home health.  Encouraged him to speak to his nurse too for any needs so that it could be relayed on.  Reports he was able to fill all of his medications.  No questions or concerns at this time.  I thanked him for participating in the [facility] callbacks.

## 2018-05-19 ENCOUNTER — Encounter: Payer: Medicare Other | Attending: Physician Assistant | Admitting: Physician Assistant

## 2018-05-19 DIAGNOSIS — I120 Hypertensive chronic kidney disease with stage 5 chronic kidney disease or end stage renal disease: Secondary | ICD-10-CM | POA: Insufficient documentation

## 2018-05-19 DIAGNOSIS — Z823 Family history of stroke: Secondary | ICD-10-CM | POA: Diagnosis not present

## 2018-05-19 DIAGNOSIS — Z880 Allergy status to penicillin: Secondary | ICD-10-CM | POA: Insufficient documentation

## 2018-05-19 DIAGNOSIS — E11622 Type 2 diabetes mellitus with other skin ulcer: Secondary | ICD-10-CM | POA: Insufficient documentation

## 2018-05-19 DIAGNOSIS — E1122 Type 2 diabetes mellitus with diabetic chronic kidney disease: Secondary | ICD-10-CM | POA: Diagnosis not present

## 2018-05-19 DIAGNOSIS — Z8249 Family history of ischemic heart disease and other diseases of the circulatory system: Secondary | ICD-10-CM | POA: Insufficient documentation

## 2018-05-19 DIAGNOSIS — N186 End stage renal disease: Secondary | ICD-10-CM | POA: Diagnosis not present

## 2018-05-19 DIAGNOSIS — Z809 Family history of malignant neoplasm, unspecified: Secondary | ICD-10-CM | POA: Diagnosis not present

## 2018-05-19 DIAGNOSIS — L97812 Non-pressure chronic ulcer of other part of right lower leg with fat layer exposed: Secondary | ICD-10-CM | POA: Diagnosis not present

## 2018-05-21 ENCOUNTER — Encounter: Payer: Self-pay | Admitting: Student

## 2018-05-21 ENCOUNTER — Other Ambulatory Visit: Payer: Self-pay | Admitting: Student

## 2018-05-21 DIAGNOSIS — Z515 Encounter for palliative care: Secondary | ICD-10-CM

## 2018-05-21 NOTE — Progress Notes (Signed)
[name], [name] ([id number]) Visit Report for 05/19/2018 Abuse/Suicide Risk Screen [name] Patient Name: [name], [name] Date of Service: 05/19/2018 10:30 AM Medical Record Number: [id number] Patient Account Number: [id number] Date of Birth/Sex: Aug 10, 1931 (84 y.o. M) Treating RN: [name] [name] Care [name]: [name] Other Clinician: Referring [name]: [name] [name]: [name] [name]: 0 Abuse/Suicide Risk Screen Items Answer ABUSE/SUICIDE RISK SCREEN: Has anyone close to you tried to hurt or harm you recentlyo No Do you feel uncomfortable with anyone in your familyo No Has anyone forced you do things that you didnot want to doo No Do you have any thoughts of harming yourselfo No Patient displays signs or symptoms of abuse and/or neglect. No Electronic Signature(s) Signed: 05/19/2018 5:05:35 PM By: [name] Entered By: [name] on 05/19/2018 11:23:37 [name] ([id number]) -------------------------------------------------------------------------------- Activities of Daily Living [name] Patient Name: [name] Date of Service: 05/19/2018 10:30 AM Medical Record Number: [id number] Patient Account Number: [id number] Date of Birth/Sex: 28-Sep-1931 (84 y.o. M) Treating RN: [name] [name]: [name] Other Clinician: Referring [name]: [name] [name] [name]: [name] [name]: 0 Activities of Daily Living Items Answer Activities of Daily Living (Please select one for each item) Drive Automobile Completely Able Take Medications Completely Able Use Telephone Completely Able Care for Appearance Completely Able Use Toilet Completely Able Bath / Shower Completely Able Dress Self Completely Able Feed Self Completely Able Walk Completely Able Get In / Out Bed Completely Able Housework Completely Able Prepare Meals Completely Able Handle Money  Completely Able Shop for Self Completely Able Electronic Signature(s) Signed: 05/19/2018 5:05:35 PM By: [name] Entered By: [name] on 05/19/2018 11:23:54 [name] ([id number]) -------------------------------------------------------------------------------- Education Assessment [name] Patient Name: [name] Date of Service: 05/19/2018 10:30 AM Medical Record Number: [id number] Patient Account Number: [id number] Date of Birth/Sex: 1931/06/24 (84 y.o. M) Treating RN: [name] [name] Care Aziya Arena: [name] Other Clinician: Referring [name]: [name] [name] Earon Rivest/Extender: [name] [name]: 0 [name]: Patient Learning Preferences/Education Level/[name] Language Learning Preference: Explanation Highest Education Level: College or Above Preferred Language: English Cognitive Barrier Assessment/Beliefs Language Barrier: No Translator Needed: No Memory Deficit: No Emotional Barrier: No Cultural/Religious Beliefs Affecting Medical Care: No Physical Barrier Assessment Impaired Vision: No Impaired Hearing: No Decreased Hand dexterity: No Knowledge/Comprehension Assessment Knowledge Level: High Comprehension Level: High Ability to understand written High instructions: Ability to understand verbal High instructions: Motivation Assessment Anxiety Level: Calm Cooperation: Cooperative Education Importance: Acknowledges Need Interest in Health Problems: Asks Questions Perception: Coherent Willingness to Engage in Self- High Management Activities: Readiness to Engage in Self- High Management Activities: Electronic Signature(s) Signed: 05/19/2018 5:05:35 PM By: [name] Entered By: [name] on 05/19/2018 11:24:20 [name] ([id number]) -------------------------------------------------------------------------------- Fall Risk Assessment [name] Patient Name: [name] Date of Service: 05/19/2018 10:30 AM Medical Record Number: [id number] Patient Account Number: [id number] Date of Birth/Sex: December 07, 1931 (84 y.o. M) Treating RN: [name] [name] Care Shaneeka Scarboro: [name] Other Clinician: Referring [name]: [name] [name] [name]: [name] [name]: 0 Fall Risk Assessment Items Have you had 2 or more falls in the last 12 monthso 0 No Have you had any fall that resulted in injury in the last 12 monthso 0 No FALL RISK ASSESSMENT: History of falling - immediate or within 3 months 0 No Secondary diagnosis 0 No Ambulatory aid None/bed rest/wheelchair/nurse 0 No Crutches/cane/walker 0 No Furniture 0  No IV Access/Saline Lock 0 No Gait/Training Normal/bed rest/immobile 0 No Weak 0 No Impaired 0 No Mental Status Oriented to own ability 0 Yes Electronic Signature(s) Signed: 05/19/2018 5:05:35 PM By: [name] Entered By: [name] on 05/19/2018 11:24:33 [name] ([id number]) -------------------------------------------------------------------------------- [name] Patient Name: [name] Date of Service: 05/19/2018 10:30 AM Medical Record Number: [id number] Patient Account Number: [id number] Date of Birth/Sex: May 15, 1932 (84 y.o. M) Treating RN: [name] [name] Care Javares Kaufhold: [name] Other Clinician: Referring [name]: [name] [name] [name]/Extender: [name] [name]: 0 [name] Assessment Items Site Locations + = Sensation present, - = Sensation absent, C = Callus, U = Ulcer R = Redness, W = Warmth, M = Maceration, PU = Pre-ulcerative lesion F = Fissure, S = Swelling, D = Dryness Assessment Right: Left: Other Deformity: No No Prior [name] Ulcer: No No Prior Amputation: No No Charcot Joint: No No Ambulatory Status: Ambulatory Without Help Gait: Steady Electronic Signature(s) Signed: 05/19/2018 5:05:35 PM By: [name] Entered By: [name] on 05/19/2018 11:26:14 [name] ([id number]) -------------------------------------------------------------------------------- Nutrition Risk Assessment [name] Patient Name: [name] Date of Service: 05/19/2018 10:30 AM Medical Record Number: [id number] Patient Account Number: [id number] Date of Birth/Sex: [birth year] (84 y.o. M) Treating RN: [name] [name] Care Skye Plamondon: [name] Other Clinician: Referring Zaylei Mullane: [name] [name] Jashua Knaak/Extender: [name] [name]: 0 Height (in): 68 Weight (lbs): 150 Body Mass Index (BMI): 22.8 Nutrition Risk Assessment Items NUTRITION RISK SCREEN: I have an illness or condition that made me change the kind and/or amount of 0 No food I eat I eat fewer than two meals per day 0 No I eat few fruits and vegetables, or milk products 0 No I have three or more drinks of beer, liquor or wine almost every day 0 No I have tooth or mouth problems that make it hard for me to eat 0 No I don't always have enough money to buy the food I need 0 No I eat alone most of the time 0 No I take three or more different prescribed or over-the-counter drugs a day 1 Yes Without wanting to, I have lost or gained 10 pounds in the last six months 0 No I am not always physically able to shop, cook and/or feed myself 0 No Nutrition Protocols Good Risk Protocol Moderate Risk Protocol Electronic Signature(s) Signed: 05/19/2018 5:05:35 PM By: [name] Entered By: [name] on 05/19/2018 11:24:39

## 2018-05-21 NOTE — Progress Notes (Signed)
[facility] Telephone: [phone] Fax: [phone]  PATIENT NAME: [name] DOB: 02-11-1932 MRN: [id number]  PRIMARY CARE PROVIDER:   Marguerita Merles, MD  REFERRING PROVIDER:  Sherril Croon  [address] [place], [place] [address] Voice: [phone] Fax: [phone]  RESPONSIBLE PARTY:  Ronie Spies, [facility]   ASSESSMENT:Mr. Kimball is alert and oriented x 3. No acute distress noted. We discussed goals of care. He does not want to pursue hemodialysis. We discussed disease process-chronic kidney disease stage 4, managing his diabetes and hypertension to help prevent further progression. Education is provided on eligibility criteria to Hospice for end stage renal disease. He and daughter in law [name] are interested with meeting with a nutritionist/registered dietician. Family is also interested in transferring patient's PCP to a local provider closer to patient's home. Daughter in law [name] is following up with making appointment with gastroenterologist. We discussed adding extra care in the home as patient requires more assistance. We discussed code status; he is a DNR.         RECOMMENDATIONS and PLAN:  1. CODE STATUS. DNR is in place. Continued discussion regarding desire for comfortable death.  2. Medical goals of therapy: At this time goals of therapy are focused on patient managing his diabetes and hypertension to prevent further progression of his stage 4 chronic kidney disease. Will continue to follow and refer for Hospice admission assessment when patient meets criteria per guidelines for End stage renal disease:The patient has 1, 2, and 3. 1. The patient is not seeking dialysis or renal transplant AND 2. Creatinine clearance* is < 10 cc/min (<15 for diabetics) AND 3. Serum creatinine > 8.0 mg/dl (> 6.0 mg/dl for diabetics) Supporting documentation for chronic renal failure includes: Uremia, Oliguria (urine output < 400 cc in  24 hours), Intractable hyperkalemia (> 7.0), Uremic pericarditis, Hepatorenal syndrome, Intractable fluid overload. Supporting documentation for acute renal failure includes: Mechanical ventilation, Malignancy (other organ system) Chronic lung disease, Advanced cardiac disease, Advanced liver disease.  3. Symptom management: diarrhea-continue imodium prn as directed. Patient is encouraged to make a food diary. Follow up with GI as scheduled.  4. Discharge Planning: Patient will continue to reside at home.  5. Emotional/spiritual support: Discussed with Mr. Featherston, daughter in law [place]. They are encouraged to call as needs/questions arise.   Palliative Care to continue to follow for emotional/spiritual support, ongoing discussions trajectory of chronic disease progression, medical goals of therapy, monitor for symptoms with management, and reduce ED and hospitalizations with recommendations.  Palliative Medicine to follow up in 4 weeks or sooner, if needed.    I spent 75 minutes providing this consultation,  from 9:00am to 10:15am More than 50% of the time in this consultation was spent coordinating communication.   HISTORY OF PRESENT ILLNESS:  SUHAIB [name] is a 84 y.o. year old male with multiple medical problems including chronic kidney disease stage 4, diabetes, hypertension, anemia secondary to renal failure. Palliative Care was asked to help address goals of care. Mr. Riegler has met with nephrologist, Dr. Justin Mend and has decided that he does not want to receive hemodialysis. He reports his blood pressure improving since being started on clonidine patch; his blood sugars range from 150-224m/dL. He is currently going to wound clinic once a week and home health [facility] with [facility] coming in the home twice a week to change dressing to right lower extremity wound. He states he recently finished physical therapy. Daughter  in law states patient is having some cognitive changes; he is more  forgetful, difficulty with driving and remembering directions. He denies pain or dyspnea. He reports having diarrhea x 3 years and has been taking imodium prn. Daughter in law [name] states that he has a history of colon polyp and it is time to follow up with gastroenterologist again. He reports a "decent" appetite. Family provides meals and he is able to prepare light meals. He reports a 2 pound weight loss. He has rolling walker and rollator walker in the home.   CODE STATUS: DNR  PPS: 60% HOSPICE ELIGIBILITY/DIAGNOSIS: TBD  PAST MEDICAL HISTORY:  Past Medical History:  Diagnosis Date  . Cancer ([place])   . Chronic kidney disease   . Colon polyp   . Diabetes mellitus without complication ([place])   . Heart murmur 2008  . Hyperlipidemia   . Hypertension 1980  . Retinal detachment     SOCIAL HX:  Social History   Tobacco Use  . Smoking status: Never Smoker  . Smokeless tobacco: Never Used  Substance Use Topics  . Alcohol use: No    ALLERGIES:  Allergies  Allergen Reactions  . Penicillins Rash     PERTINENT MEDICATIONS:  Outpatient Encounter Medications as of 05/21/2018  Medication Sig  . amLODipine (NORVASC) 10 MG tablet Take 10 mg by mouth daily.   Marland Kitchen aspirin EC 81 MG EC tablet Take 1 tablet (81 mg total) by mouth daily.  . cloNIDine (CATAPRES - DOSED IN MG/24 HR) 0.2 mg/24hr patch Place 0.2 mg onto the skin every Sunday.   . Ferrous Sulfate (IRON) 28 MG TABS Take 1 tablet by mouth.  . furosemide (LASIX) 40 MG tablet Take 40 mg by mouth daily. Taking 109m each am and 227meach evening.  . Marland KitchenALYN 0.5-0.4 MG CAPS Take 1 capsule by mouth daily.   . Marland Kitchenabetalol (NORMODYNE) 200 MG tablet Take 100 mg by mouth daily.  . Marland Kitcheninagliptin (TRADJENTA) 5 MG TABS tablet Take 5 mg by mouth daily.  . Marland Kitchenoperamide (IMODIUM) 2 MG capsule Take 2 mg by mouth as needed for diarrhea or loose stools.  . Multiple Vitamin (MULTIVITAMIN) capsule Take 1 capsule by mouth daily.  . Marland Kitchentorvastatin (LIPITOR) 10 MG  tablet Take 1 tablet (10 mg total) by mouth daily at 6 PM. (Patient not taking: Reported on 05/21/2018)  . clopidogrel (PLAVIX) 75 MG tablet Take 1 tablet (75 mg total) by mouth daily.  . Marland KitchenLIPIZIDE XL 2.5 MG 24 hr tablet Take 2.5 mg by mouth daily.    No facility-administered encounter medications on file as of 05/21/2018.     PHYSICAL EXAM:   General: NAD, frail appearing Cardiovascular: regular rate and rhythm Pulmonary: clear ant fields Abdomen: soft, nontender, + bowel sounds Extremities: 1+non pitting edema, no joint deformities Skin: healing wound to right lower extremity Neurological: Weakness but otherwise nonfocal  LaEzekiel SlocumbNP

## 2018-05-24 NOTE — Progress Notes (Signed)
SAHEJ, SCHRIEBER ([id number]) Visit Report for 05/19/2018 Chief Complaint Document Details Patient Name: [name], [name] Date of Service: 05/19/2018 10:30 AM Medical Record Number: [id number] Patient Account Number: [id number] Date of Birth/Sex: 01/08/1932 (84 y.o. M) Treating RN: Montey Hora Primary Care Provider: Delight Stare Other Clinician: Referring Provider: Elisabeth Cara Treating Provider/Extender: Melburn Hake, HOYT Weeks in Treatment: 0 Information Obtained from: Patient Chief Complaint Right LE Ulcer Electronic Signature(s) Signed: 05/22/2018 8:01:46 PM By: [name] Keeler PA-C Entered By: [name] on 05/19/2018 11:58:19 Ellena, [name] ([id number]) -------------------------------------------------------------------------------- HPI Details Patient Name: [name] Date of Service: 05/19/2018 10:30 AM Medical Record Number: [id number] Patient Account Number: [id number] Date of Birth/Sex: 1931-08-20 (84 y.o. M) Treating RN: Montey Hora Primary Care Provider: Delight Stare Other Clinician: Referring Provider: Elisabeth Cara Treating Provider/Extender: Melburn Hake, HOYT Weeks in Treatment: 0 History of Present Illness HPI Description: 05/19/18 on evaluation today patient presents for initial evaluation and clinic concerning issues that he has been having with his right medial lower leg ulcer. The patient has actually been seen in the emergency department for cellulitis he was admitted at [facility] on 04/19/18 discharged 04/21/18. He was noted to have proof of vascular disease and did undergo an angiogram on 04/20/18 by Dr. Leotis Pain. 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 Subsequently Dr. dew following intervention noted that the patient had depending on the location 20-40% residual stenosis noted and significant improvement in the overall vascular flow.  Obviously this is great news. With that being said the patient's wound he states does seem to be doing some better compared to previous. Upon inspection indeed it appears that he has some epithelialization. Overall I do not see any evidence of significant infection which is great news. No fevers chills noted Electronic Signature(s) Signed: 05/22/2018 8:01:46 PM By: [name] Keeler PA-C Entered By: [name] on 05/22/2018 19:41:42 Scurlock, [name] ([id number]) -------------------------------------------------------------------------------- Physical Exam Details Patient Name: [name] Date of Service: 05/19/2018 10:30 AM Medical Record Number: [id number] Patient Account Number: [id number] Date of Birth/Sex: [birth year] (84 y.o. M) Treating RN: Montey Hora Primary Care Provider: Delight Stare Other Clinician: Referring Provider: Elisabeth Cara Treating Provider/Extender: STONE III, HOYT Weeks in Treatment: 0 Constitutional sitting or standing blood pressure is within target range for patient.. pulse regular and within target range for patient.Marland [name] respirations regular, non-labored and within target range for patient.Marland [name] temperature within target range for patient.. Well- nourished and well-hydrated in no acute distress. Eyes conjunctiva clear no eyelid edema noted. pupils equal round and reactive to light and accommodation. Ears, Nose, Mouth, and Throat no gross abnormality of ear auricles or external auditory canals. normal hearing noted during conversation. mucus membranes moist. Respiratory normal breathing without difficulty. clear to auscultation bilaterally. Cardiovascular regular rate and rhythm with normal S1, S2. 1+ dorsalis pedis/posterior tibialis pulses. no clubbing, cyanosis, significant edema, <3 sec cap refill. Gastrointestinal (GI) soft, non-tender, non-distended, +BS. no ventral hernia noted. Musculoskeletal normal gait and posture. no significant deformity or  arthritic changes, no loss or range of motion, no clubbing. Psychiatric this patient is able to make decisions and demonstrates good insight into disease process. Alert and Oriented x 3. pleasant and cooperative. Notes Patient's wound bed currently shows evidence of good granulation at this time. He actually has good epithelialization as well. No sharp debridement was necessary today. Electronic Signature(s) Signed: 05/22/2018 8:01:46  PM By: [name] Keeler PA-C Entered By: [name] on 05/22/2018 19:42:41 [place], [name] ([id number]) -------------------------------------------------------------------------------- Physician Orders Details Patient Name: [name] Date of Service: 05/19/2018 10:30 AM Medical Record Number: [id number] Patient Account Number: [id number] Date of Birth/Sex: 07/24/1931 (84 y.o. M) Treating RN: Montey Hora Primary Care Provider: Delight Stare Other Clinician: Referring Provider: Elisabeth Cara Treating Provider/Extender: Melburn Hake, HOYT Weeks in Treatment: 0 Verbal / Phone Orders: No Diagnosis Coding ICD-10 Coding Code Description E11.622 Type 2 diabetes mellitus with other skin ulcer L97.812 Non-pressure chronic ulcer of other part of right lower leg with fat layer exposed I73.89 Other specified peripheral vascular diseases I10 Essential (primary) hypertension N18.4 Chronic kidney disease, stage 4 (severe) Wound Cleansing Wound #1 Right,Medial Lower Leg o Clean wound with Normal Saline. o Cleanse wound with mild soap and water Primary Wound Dressing Wound #1 Right,Medial Lower Leg o Hydrafera Blue Ready Transfer Secondary Dressing Wound #1 Right,Medial Lower Leg o ABD pad Dressing Change Frequency Wound #1 Right,Medial Lower Leg o Other: - Twice weekly - once by [facility] on Tuesdays and once by [facility] Follow-up Appointments Wound #1 Right,Medial Lower Leg o Return Appointment in 1 week. Edema Control Wound  #1 Right,Medial Lower Leg o 3 Layer Compression System - Right Lower Extremity o Elevate legs to the level of the heart and pump ankles as often as possible Home Health Wound #1 Right,Medial Lower Leg o [place] Nurse may visit PRN to address patientos wound care needs. o FACE TO FACE ENCOUNTER: MEDICARE and MEDICAID PATIENTS: I certify that this patient is under my care and that I had a face-to-face encounter that meets the physician face-to-face encounter requirements with this MERCED, HANNERS R. ([id number]) patient on this date. The encounter with the patient was in whole or in part for the following MEDICAL CONDITION: (primary reason for [place]) MEDICAL NECESSITY: I certify, that based on my findings, NURSING services are a medically necessary home health service. HOME BOUND STATUS: I certify that my clinical findings support that this patient is homebound (i.e., Due to illness or injury, pt requires aid of supportive devices such as crutches, cane, wheelchairs, walkers, the use of special transportation or the assistance of another person to leave their place of residence. There is a normal inability to leave the home and doing so requires considerable and taxing effort. Other absences are for medical reasons / religious services and are infrequent or of short duration when for other reasons). o If current dressing causes regression in wound condition, may D/C ordered dressing product/s and apply Normal Saline Moist Dressing daily until next [place] / Other MD appointment. [place] of regression in wound condition at [phone]. o Please direct any NON-WOUND related issues/requests for orders to patient's Primary Care Physician Electronic Signature(s) Signed: 05/19/2018 5:28:58 PM By: Montey Hora Signed: 05/22/2018 8:01:46 PM By: [name] Keeler PA-C Entered By: Montey Hora on 05/19/2018  12:05:21 AGASTYA, MEISTER ([id number]) -------------------------------------------------------------------------------- Problem List Details Patient Name: [name] Date of Service: 05/19/2018 10:30 AM Medical Record Number: [id number] Patient Account Number: [id number] Date of Birth/Sex: 18-Sep-1931 (84 y.o. M) Treating RN: Montey Hora Primary Care Provider: Delight Stare Other Clinician: Referring Provider: Elisabeth Cara Treating Provider/Extender: Melburn Hake, HOYT Weeks in Treatment: 0 Active Problems ICD-10 Evaluated Encounter Code Description Active Date Today Diagnosis E11.622 Type 2 diabetes mellitus with other skin ulcer 05/19/2018 No Yes L97.812 Non-pressure chronic ulcer of  other part of right lower leg 05/19/2018 No Yes with fat layer exposed I73.89 Other specified peripheral vascular diseases 05/19/2018 No Yes I10 Essential (primary) hypertension 05/19/2018 No Yes N18.4 Chronic kidney disease, stage 4 (severe) 05/19/2018 No Yes Inactive Problems Resolved Problems Electronic Signature(s) Signed: 05/22/2018 8:01:46 PM By: [name] Keeler PA-C Entered By: [name] on 05/19/2018 11:56:43 Sao, Czar R. ([id number]) -------------------------------------------------------------------------------- Progress Note Details Patient Name: [name] Date of Service: 05/19/2018 10:30 AM Medical Record Number: [id number] Patient Account Number: [id number] Date of Birth/Sex: 1931/09/27 (84 y.o. M) Treating RN: Montey Hora Primary Care Provider: Delight Stare Other Clinician: Referring Provider: Elisabeth Cara Treating Provider/Extender: Melburn Hake, HOYT Weeks in Treatment: 0 Subjective Chief Complaint Information obtained from Patient Right LE Ulcer History of Present Illness (HPI) 05/19/18 on evaluation today patient presents for initial evaluation and clinic concerning issues that he has been having with his right medial lower leg ulcer. The patient has actually  been seen in the emergency department for cellulitis he was admitted at [facility] on 04/19/18 discharged 04/21/18. He was noted to have proof of vascular disease and did undergo an angiogram on 04/20/18 by Dr. Leotis Pain. 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 Subsequently Dr. dew following intervention noted that the patient had depending on the location 20-40% residual stenosis noted and significant improvement in the overall vascular flow. Obviously this is great news. With that being said the patient's wound he states does seem to be doing some better compared to previous. Upon inspection indeed it appears that he has some epithelialization. Overall I do not see any evidence of significant infection which is great news. No fevers chills noted Wound History Patient presents with 1 open wound that has been present for approximately 3 months. Patient has been treating wound in the following manner: bandage, Home Health treating. Laboratory tests have been performed in the last month. Patient reportedly has not tested positive for an antibiotic resistant organism. Patient reportedly has not tested positive for osteomyelitis. Patient reportedly has had testing performed to evaluate circulation in the legs. Patient experiences the following problems associated with their wounds: infection. Patient History Information obtained from Patient. Allergies PCN (Severity: Mild, Reaction: hives) Family History Cancer - Mother, Diabetes - Siblings, Hypertension - Siblings, Stroke - Siblings, No family history of Heart Disease, Hereditary Spherocytosis, Kidney Disease, Lung Disease, Seizures, Thyroid Problems, Tuberculosis. Social History Never smoker, Alcohol Use - Never, Drug Use - No History, Caffeine Use - Rarely. Medical History Eyes Patient has history of Cataracts Denies history of  Glaucoma, Optic Neuritis Hematologic/Lymphatic Denies history of Anemia, Hemophilia, Human Immunodeficiency Virus, Lymphedema, Sickle Cell Disease Respiratory Denies history of Aspiration, Asthma, Chronic Obstructive Pulmonary Disease (COPD), Pneumothorax, Sleep Apnea, Hartin, Jahmel R. ([id number]) Tuberculosis Cardiovascular Patient has history of Hypertension - takes medication, Peripheral Venous Disease Gastrointestinal Denies history of Cirrhosis , Colitis, Crohn s, Hepatitis A, Hepatitis B, Hepatitis C Endocrine Patient has history of Type II Diabetes - 15 years Genitourinary Patient has history of End Stage Renal Disease - Stage 4, no dialysis, loses protein Immunological Denies history of Lupus Erythematosus, Raynaud s, Scleroderma Integumentary (Skin) Denies history of History of Burn, History of pressure wounds Musculoskeletal Denies history of Gout, Rheumatoid Arthritis, Osteoarthritis, Osteomyelitis Neurologic Denies history of Dementia, Neuropathy, Quadriplegia, Paraplegia, Seizure Disorder Oncologic Denies history of Received Chemotherapy, Received Radiation Psychiatric Denies history of Anorexia/bulimia, Confinement  Anxiety Patient is treated with Oral Agents. Blood sugar is tested. Medical And Surgical History Notes Eyes Detached Retina 1980 Review of Systems (ROS) Eyes Complains or has symptoms of Glasses / Contacts - for distance. Ear/Nose/Mouth/Throat The patient has no complaints or symptoms. Hematologic/Lymphatic The patient has no complaints or symptoms. Respiratory The patient has no complaints or symptoms. Cardiovascular Complains or has symptoms of LE edema - Right Leg. Gastrointestinal Complains or has symptoms of Frequent diarrhea - 4 times in past 12 hours-imodium. Denies complaints or symptoms of Nausea, Vomiting. Endocrine The patient has no complaints or symptoms. Immunological The patient has no complaints or symptoms. Integumentary  (Skin) Complains or has symptoms of Wounds - one, Bleeding or bruising tendency. Denies complaints or symptoms of Breakdown, Swelling. Musculoskeletal The patient has no complaints or symptoms. Neurologic The patient has no complaints or symptoms. Oncologic The patient has no complaints or symptoms. CYNTHIA, STAINBACK R. ([id number]) Objective Constitutional sitting or standing blood pressure is within target range for patient.. pulse regular and within target range for patient.Marland [name] respirations regular, non-labored and within target range for patient.Marland [name] temperature within target range for patient.. Well- nourished and well-hydrated in no acute distress. Vitals Time Taken: 10:55 AM, Height: 68 in, Source: Stated, Weight: 150 lbs, Source: Stated, BMI: 22.8, Temperature: 97.6  F, Pulse: 61 bpm, Respiratory Rate: 16 breaths/min, Blood Pressure: 127/55 mmHg, Capillary Blood Glucose: 205 mg/dl. Eyes conjunctiva clear no eyelid edema noted. pupils equal round and reactive to light and accommodation. Ears, Nose, Mouth, and Throat no gross abnormality of ear auricles or external auditory canals. normal hearing noted during conversation. mucus membranes moist. Respiratory normal breathing without difficulty. clear to auscultation bilaterally. Cardiovascular regular rate and rhythm with normal S1, S2. 1+ dorsalis pedis/posterior tibialis pulses. no clubbing, cyanosis, significant edema, Gastrointestinal (GI) soft, non-tender, non-distended, +BS. no ventral hernia noted. Musculoskeletal normal gait and posture. no significant deformity or arthritic changes, no loss or range of motion, no clubbing. Psychiatric this patient is able to make decisions and demonstrates good insight into disease process. Alert and Oriented x 3. pleasant and cooperative. General Notes: Patient's wound bed currently shows evidence of good granulation at this time. He actually has good epithelialization as well. No sharp  debridement was necessary today. Integumentary (Hair, Skin) Wound #1 status is Open. Original cause of wound was Blister. The wound is located on the Right,Medial Lower Leg. The wound measures 5cm length x 2.5cm width x 0.1cm depth; 9.817cm^2 area and 0.982cm^3 volume. There is Fat Layer (Subcutaneous Tissue) Exposed exposed. There is no tunneling or undermining noted. There is a medium amount of serosanguineous drainage noted. The wound margin is flat and intact. There is small (1-33%) red, pink granulation within the wound bed. There is no necrotic tissue within the wound bed. The periwound skin appearance exhibited: Scarring, Dry/Scaly, Hemosiderin Staining. The periwound skin appearance did not exhibit: Callus, Crepitus, Excoriation, Induration, Rash, Maceration, Atrophie Blanche, Cyanosis, Ecchymosis, Mottled, Pallor, Rubor, Erythema. Periwound temperature was noted as No Abnormality. The periwound has tenderness on palpation. Assessment Active Problems Hamelin, Oberon R. ([id number]) ICD-10 Type 2 diabetes mellitus with other skin ulcer Non-pressure chronic ulcer of other part of right lower leg with fat layer exposed Other specified peripheral vascular diseases Essential (primary) hypertension Chronic kidney disease, stage 4 (severe) Procedures Wound #1 Pre-procedure diagnosis of Wound #1 is a Diabetic Wound/Ulcer of the Lower Extremity located on the Right,Medial Lower Leg . There was a Three Layer Compression Therapy Procedure with a pre-treatment ABI of  0.9 by Montey Hora, RN. Post procedure Diagnosis Wound #1: Same as Pre-Procedure Plan Wound Cleansing: Wound #1 Right,Medial Lower Leg: Clean wound with Normal Saline. Cleanse wound with mild soap and water Primary Wound Dressing: Wound #1 Right,Medial Lower Leg: Hydrafera Blue Ready Transfer Secondary Dressing: Wound #1 Right,Medial Lower Leg: ABD pad Dressing Change Frequency: Wound #1 Right,Medial Lower Leg: Other: -  Twice weekly - once by [facility] on Tuesdays and once by [facility] Follow-up Appointments: Wound #1 Right,Medial Lower Leg: Return Appointment in 1 week. Edema Control: Wound #1 Right,Medial Lower Leg: 3 Layer Compression System - Right Lower Extremity Elevate legs to the level of the heart and pump ankles as often as possible Home Health: Wound #1 Right,Medial Lower Leg: [place] Nurse may visit PRN to address patient s wound care needs. FACE TO FACE ENCOUNTER: MEDICARE and MEDICAID PATIENTS: I certify that this patient is under my care and that I had a face-to-face encounter that meets the physician face-to-face encounter requirements with this patient on this date. The encounter with the patient was in whole or in part for the following MEDICAL CONDITION: (primary reason for [place]) MEDICAL NECESSITY: I certify, that based on my findings, NURSING services are a medically necessary home health service. HOME BOUND STATUS: I certify that my clinical findings support that this patient is homebound (i.e., Due to illness or injury, pt requires aid of supportive devices such as crutches, cane, wheelchairs, walkers, the use of special transportation or the assistance of another person to leave their place of residence. There is a normal inability to leave the home and doing so requires considerable and taxing effort. Other absences are for medical reasons / religious services and VIYAN, ROSAMOND. ([id number]) are infrequent or of short duration when for other reasons). If current dressing causes regression in wound condition, may D/C ordered dressing product/s and apply Normal Saline Moist Dressing daily until next [place] / Other MD appointment. [place] of regression in wound condition at [phone]. Please direct any NON-WOUND related issues/requests for orders to patient's Primary Care Physician My suggestion  currently is that we continue with the above wound care measures for the time being. The patient is in agreement the plan. Subsequently we're gonna see were things stand at one weeks time follow-up. If anything changes or worsens in the meantime he will contact the office and let me know. Please see above for specific wound care orders. We will see patient for re-evaluation in 1 week(s) here in the clinic. If anything worsens or changes patient will contact our office for additional recommendations. Electronic Signature(s) Signed: 05/22/2018 8:01:46 PM By: [name] Keeler PA-C Entered By: [name] on 05/22/2018 19:43:24 [place], [name] ([id number]) -------------------------------------------------------------------------------- ROS/PFSH Details Patient Name: [name] Date of Service: 05/19/2018 10:30 AM Medical Record Number: [id number] Patient Account Number: [id number] Date of Birth/Sex: August 20, 1931 (84 y.o. M) Treating RN: Harold Barban Primary Care Provider: Delight Stare Other Clinician: Referring Provider: Elisabeth Cara Treating Provider/Extender: STONE III, HOYT Weeks in Treatment: 0 Information Obtained From Patient Wound History Do you currently have one or more open woundso Yes How many open wounds do you currently haveo 1 Approximately how long have you had your woundso 3 months How have you been treating your wound(s) until nowo bandage, Home Health treating Has your wound(s) ever healed and then re-openedo No Have you had any lab work done in the past montho Yes Who  ordered the lab work Bee Have you tested positive for an antibiotic resistant organism (MRSA, VRE)o No Have you tested positive for osteomyelitis (bone infection)o No Have you had any tests for circulation on your legso Yes Where was the test doneo [facility] Have you had other problems associated with your woundso Infection Eyes Complaints and  Symptoms: Positive for: Glasses / Contacts - for distance Medical History: Positive for: Cataracts Negative for: Glaucoma; Optic Neuritis Past Medical History Notes: Detached Retina 1980 Cardiovascular Complaints and Symptoms: Positive for: LE edema - Right Leg Medical History: Positive for: Hypertension - takes medication; Peripheral Venous Disease Gastrointestinal Complaints and Symptoms: Positive for: Frequent diarrhea - 4 times in past 12 hours-imodium Negative for: Nausea; Vomiting Medical History: Negative for: Cirrhosis ; Colitis; Crohnos; Hepatitis A; Hepatitis B; Hepatitis C Integumentary (Skin) Complaints and Symptoms: Positive for: Wounds - one; Bleeding or bruising tendency Weinand, Jamiah R. ([id number]) Negative for: Breakdown; Swelling Medical History: Negative for: History of Burn; History of pressure wounds Musculoskeletal Complaints and Symptoms: No Complaints or Symptoms Complaints and Symptoms: Negative for: Muscle Pain; Muscle Weakness Medical History: Negative for: Gout; Rheumatoid Arthritis; Osteoarthritis; Osteomyelitis Ear/Nose/Mouth/Throat Complaints and Symptoms: No Complaints or Symptoms Hematologic/Lymphatic Complaints and Symptoms: No Complaints or Symptoms Medical History: Negative for: Anemia; Hemophilia; Human Immunodeficiency Virus; Lymphedema; Sickle Cell Disease Respiratory Complaints and Symptoms: No Complaints or Symptoms Medical History: Negative for: Aspiration; Asthma; Chronic Obstructive Pulmonary Disease (COPD); Pneumothorax; Sleep Apnea; Tuberculosis Endocrine Complaints and Symptoms: No Complaints or Symptoms Medical History: Positive for: Type II Diabetes - 15 years Time with diabetes: 15 years Treated with: Oral agents Blood sugar tested every day: Yes Tested : 205 Genitourinary Medical History: Positive for: End Stage Renal Disease - Stage 4, no dialysis, loses protein Immunological Complaints and Symptoms: No  Complaints or Symptoms Hayes, Dasani R. ([id number]) Medical History: Negative for: Lupus Erythematosus; Raynaudos; Scleroderma Neurologic Complaints and Symptoms: No Complaints or Symptoms Medical History: Negative for: Dementia; Neuropathy; Quadriplegia; Paraplegia; Seizure Disorder Oncologic Complaints and Symptoms: No Complaints or Symptoms Medical History: Negative for: Received Chemotherapy; Received Radiation Psychiatric Medical History: Negative for: Anorexia/bulimia; Confinement Anxiety HBO Extended History Items Eyes: Cataracts Immunizations Pneumococcal Vaccine: Received Pneumococcal Vaccination: Yes Implantable Devices Family and Social History Cancer: Yes - Mother; Diabetes: Yes - Siblings; Heart Disease: No; Hereditary Spherocytosis: No; Hypertension: Yes - Siblings; Kidney Disease: No; Lung Disease: No; Seizures: No; Stroke: Yes - Siblings; Thyroid Problems: No; Tuberculosis: No; Never smoker; Alcohol Use: Never; Drug Use: No History; Caffeine Use: Rarely; Financial Concerns: No; Food, Clothing or Shelter Needs: No; Support System Lacking: No; Transportation Concerns: No; Advanced Directives: Yes; Living Will: Yes Electronic Signature(s) Signed: 05/19/2018 5:05:35 PM By: Harold Barban Signed: 05/22/2018 8:01:46 PM By: [name] Keeler PA-C Entered By: Harold Barban on 05/19/2018 11:23:29 Absher, [name] ([id number]) -------------------------------------------------------------------------------- SuperBill Details Patient Name: [name] Date of Service: 05/19/2018 Medical Record Number: [id number] Patient Account Number: [id number] Date of Birth/Sex: 02-Aug-1931 (84 y.o. M) Treating RN: Montey Hora Primary Care Provider: Delight Stare Other Clinician: Referring Provider: Elisabeth Cara Treating Provider/Extender: Melburn Hake, HOYT Weeks in Treatment: 0 Diagnosis Coding ICD-10 Codes Code Description E11.622 Type 2 diabetes mellitus with other skin  ulcer L97.812 Non-pressure chronic ulcer of other part of right lower leg with fat layer exposed I73.89 Other specified peripheral vascular diseases I10 Essential (primary) hypertension N18.4 Chronic kidney disease, stage 4 (severe) Facility Procedures CPT4 Code: 97353299 Description: 99213 - WOUND CARE VISIT-LEV 3 EST  PT Modifier: Quantity: 1 Physician Procedures CPT4 Code Description: 0905025 Lares PHYS LEVEL 3 o NEW PT ICD-10 Diagnosis Description E11.622 Type 2 diabetes mellitus with other skin ulcer L97.812 Non-pressure chronic ulcer of other part of right lower leg I73.89 Other specified peripheral vascular  diseases I10 Essential (primary) hypertension Modifier: with fat layer expo Quantity: 1 sed Electronic Signature(s) Signed: 05/22/2018 8:01:46 PM By: [name] Keeler PA-C Entered By: [name] on 05/20/2018 05:01:35

## 2018-05-24 NOTE — Progress Notes (Signed)
GUERRY, COVINGTON ([id number]) Visit Report for 05/19/2018 Allergy List Details Patient Name: [name], [name] Date of Service: 05/19/2018 10:30 AM Medical Record Number: [id number] Patient Account Number: [id number] Date of Birth/Sex: 1931-12-16 (84 y.o. M) Treating RN: Harold Barban Primary Care Nessie Nong: Delight Stare Other Clinician: Referring Erick Oxendine: Elisabeth Cara Treating Sasha Rueth/Extender: STONE III, HOYT Weeks in Treatment: 0 Allergies Active Allergies PCN Reaction: hives Severity: Mild Allergy Notes Electronic Signature(s) Signed: 05/19/2018 5:05:35 PM By: Harold Barban Entered By: Harold Barban on 05/19/2018 11:01:09 Fels, [name] ([id number]) -------------------------------------------------------------------------------- Arrival Information Details Patient Name: [name] Date of Service: 05/19/2018 10:30 AM Medical Record Number: [id number] Patient Account Number: [id number] Date of Birth/Sex: 31-Mar-1932 (84 y.o. M) Treating RN: Harold Barban Primary Care Aisha Greenberger: Delight Stare Other Clinician: Referring Raeann Offner: Elisabeth Cara Treating Kensey Luepke/Extender: Melburn Hake, HOYT Weeks in Treatment: 0 Visit Information Patient Arrived: Ambulatory Arrival Time: 10:52 Accompanied By: daughter Transfer Assistance: None Patient Identification Verified: Yes Secondary Verification Process Completed: Yes Electronic Signature(s) Signed: 05/19/2018 5:05:35 PM By: Harold Barban Entered By: Harold Barban on 05/19/2018 10:53:04 Park, [name] ([id number]) -------------------------------------------------------------------------------- Clinic Level of Care Assessment Details Patient Name: [name] Date of Service: 05/19/2018 10:30 AM Medical Record Number: [id number] Patient Account Number: [id number] Date of Birth/Sex: [birth year] (84 y.o. M) Treating RN: Montey Hora Primary Care Jumana Paccione: Delight Stare Other Clinician: Referring Lexus Barletta: Elisabeth Cara Treating Kada Friesen/Extender: STONE III, HOYT Weeks in Treatment: 0 Clinic Level of Care Assessment Items TOOL 1 Quantity Score []  - Use when EandM and Procedure is performed on INITIAL visit 0 ASSESSMENTS - Nursing Assessment / Reassessment X - General Physical Exam (combine w/ comprehensive assessment (listed just below) when 1 20 performed on new pt. evals) X- 1 25 Comprehensive Assessment (HX, ROS, Risk Assessments, Wounds Hx, etc.) ASSESSMENTS - Wound and Skin Assessment / Reassessment []  - Dermatologic / Skin Assessment (not related to wound area) 0 ASSESSMENTS - Ostomy and/or Continence Assessment and Care []  - Incontinence Assessment and Management 0 []  - 0 Ostomy Care Assessment and Management (repouching, etc.) PROCESS - Coordination of Care X - Simple Patient / Family Education for ongoing care 1 15 []  - 0 Complex (extensive) Patient / Family Education for ongoing care X- 1 10 Staff obtains Programmer, systems, Records, Test Results / Process Orders []  - 0 Staff telephones HHA, Nursing Homes / Clarify orders / etc []  - 0 Routine Transfer to another Facility (non-emergent condition) []  - 0 Routine Hospital Admission (non-emergent condition) X- 1 15 New Admissions / Biomedical engineer / Ordering NPWT, Apligraf, etc. []  - 0 Emergency Hospital Admission (emergent condition) PROCESS - Special Needs []  - Pediatric / Minor Patient Management 0 []  - 0 Isolation Patient Management []  - 0 Hearing / Language / Visual special needs []  - 0 Assessment of Community assistance (transportation, D/C planning, etc.) []  - 0 Additional assistance / Altered mentation []  - 0 Support Surface(s) Assessment (bed, cushion, seat, etc.) Wickizer, Emori R. ([id number]) INTERVENTIONS - Miscellaneous []  - External ear exam 0 []  - 0 Patient Transfer (multiple staff / Civil Service fast streamer / Similar devices) []  - 0 Simple Staple / Suture removal (25 or less) []  - 0 Complex Staple / Suture removal (26  or more) []  - 0 Hypo/Hyperglycemic Management (do not check if billed separately) X- 1 15 Ankle / Brachial Index (ABI) - do not check if billed separately Has the patient been seen at the hospital within the last three years: Yes Total Score: 100 Level Of Care: New/Established - Level  3 Electronic Signature(s) Signed: 05/19/2018 5:28:58 PM By: Montey Hora Entered By: Montey Hora on 05/19/2018 12:05:54 Nordin, [name] ([id number]) -------------------------------------------------------------------------------- Compression Therapy Details Patient Name: [name] Date of Service: 05/19/2018 10:30 AM Medical Record Number: [id number] Patient Account Number: [id number] Date of Birth/Sex: November 21, 1931 (84 y.o. M) Treating RN: Montey Hora Primary Care Stefani Baik: Delight Stare Other Clinician: Referring Zionah Criswell: Elisabeth Cara Treating Jade Burright/Extender: STONE III, HOYT Weeks in Treatment: 0 Compression Therapy Performed for Wound Assessment: Wound #1 Right,Medial Lower Leg Performed By: Clinician Montey Hora, RN Compression Type: Three Layer Pre Treatment ABI: 0.9 Post Procedure Diagnosis Same as Pre-procedure Electronic Signature(s) Signed: 05/19/2018 5:28:58 PM By: Montey Hora Entered By: Montey Hora on 05/19/2018 12:05:41 Teem, [name] ([id number]) -------------------------------------------------------------------------------- Encounter Discharge Information Details Patient Name: [name] Date of Service: 05/19/2018 10:30 AM Medical Record Number: [id number] Patient Account Number: [id number] Date of Birth/Sex: 23-Nov-1931 (84 y.o. M) Treating RN: Montey Hora Primary Care Tylasia Fletchall: Delight Stare Other Clinician: Referring Jarone Ostergaard: Elisabeth Cara Treating Meerab Maselli/Extender: Melburn Hake, HOYT Weeks in Treatment: 0 Encounter Discharge Information Items Discharge Condition: Stable Ambulatory Status: Ambulatory Discharge Destination: Home Transportation:  Private Auto Accompanied By: daughter Schedule Follow-up Appointment: Yes Clinical Summary of Care: Electronic Signature(s) Signed: 05/19/2018 5:28:58 PM By: Montey Hora Entered By: Montey Hora on 05/19/2018 12:07:10 [name] ([id number]) -------------------------------------------------------------------------------- Lower Extremity Assessment Details Patient Name: [name] Date of Service: 05/19/2018 10:30 AM Medical Record Number: [id number] Patient Account Number: [id number] Date of Birth/Sex: 05-Oct-1931 (84 y.o. M) Treating RN: Harold Barban Primary Care Cage Gupton: Delight Stare Other Clinician: Referring Alessa Mazur: Elisabeth Cara Treating Nancye Grumbine/Extender: Melburn Hake, HOYT Weeks in Treatment: 0 Edema Assessment Assessed: [Left: No] [Right: No] [Left: Edema] [Right: :] Calf Left: Right: Point of Measurement: 34 cm From Medial Instep 29.5 cm 32 cm Ankle Left: Right: Point of Measurement: 12 cm From Medial Instep 21 cm 24 cm Vascular Assessment Claudication: Claudication Assessment [Left:None] [Right:None] Pulses: Dorsalis Pedis Palpable: [Right:Yes] Posterior Tibial Palpable: [Left:Yes] [Right:Yes] Extremity colors, hair growth, and conditions: Extremity Color: [Left:Hyperpigmented] [Right:Hyperpigmented] Hair Growth on Extremity: [Left:No] [Right:No] Temperature of Extremity: [Left:Warm] [Right:Warm] Capillary Refill: [Left:< 3 seconds] [Right:< 3 seconds] Blood Pressure: Brachial: [Left:127] [Right:127] Dorsalis Pedis: 90 [Left:Dorsalis Pedis: 655] Ankle: Posterior Tibial: [Left:Posterior Tibial: 0.71] [Right:0.93] Toe Nail Assessment Left: Right: Thick: No Discolored: No Deformed: No Improper Length and Hygiene: No Electronic Signature(s) Signed: 05/19/2018 5:05:35 PM By: Harold Barban Entered By: Harold Barban on 05/19/2018 11:34:06 Brase, [name] ([id number]) Breed, [name]  ([id number]) -------------------------------------------------------------------------------- Multi Wound Chart Details Patient Name: [name] Date of Service: 05/19/2018 10:30 AM Medical Record Number: [id number] Patient Account Number: [id number] Date of Birth/Sex: July 17, 1931 (84 y.o. M) Treating RN: Montey Hora Primary Care Areonna Bran: Delight Stare Other Clinician: Referring Giselle Brutus: Elisabeth Cara Treating Judaea Burgoon/Extender: STONE III, HOYT Weeks in Treatment: 0 Vital Signs Height(in): 68 Capillary Blood Glucose 205 (mg/dl): Weight(lbs): 150 Pulse(bpm): 61 Body Mass Index(BMI): 23 Blood Pressure(mmHg): 127/55 Temperature(F): 97.6 Respiratory Rate 16 (breaths/min): Photos: [N/A:N/A] Wound Location: Right Lower Leg - Medial N/A N/A Wounding Event: Blister N/A N/A Primary Etiology: Diabetic Wound/Ulcer of the N/A N/A Lower Extremity Comorbid History: Cataracts, Hypertension, N/A N/A Peripheral Venous Disease, Type II Diabetes, End Stage Renal Disease Date Acquired: 04/19/2018 N/A N/A Weeks of Treatment: 0 N/A N/A Wound Status: Open N/A N/A Measurements L x W x D 5x2.5x0.1 N/A N/A (cm) Area (cm) : 9.817 N/A N/A Volume (cm) : 0.982 N/A N/A % Reduction in Area: 0.00% N/A N/A % Reduction  in Volume: 0.00% N/A N/A Classification: Grade 2 N/A N/A Exudate Amount: Medium N/A N/A Exudate Type: Serosanguineous N/A N/A Exudate Color: red, brown N/A N/A Wound Margin: Flat and Intact N/A N/A Granulation Amount: Small (1-33%) N/A N/A Granulation Quality: Red, Pink N/A N/A Necrotic Amount: None Present (0%) N/A N/A Exposed Structures: Fat Layer (Subcutaneous N/A N/A Tissue) Exposed: Yes Fascia: No Tendon: No Stalker, Vitaliy R. ([id number]) Muscle: No Joint: No Bone: No Epithelialization: Small (1-33%) N/A N/A Periwound Skin Texture: Scarring: Yes N/A N/A Excoriation: No Induration: No Callus: No Crepitus: No Rash: No Periwound Skin Moisture: Dry/Scaly: Yes  N/A N/A Maceration: No Periwound Skin Color: Hemosiderin Staining: Yes N/A N/A Atrophie Blanche: No Cyanosis: No Ecchymosis: No Erythema: No Mottled: No Pallor: No Rubor: No Temperature: No Abnormality N/A N/A Tenderness on Palpation: Yes N/A N/A Wound Preparation: Ulcer Cleansing: N/A N/A Rinsed/Irrigated with Saline Topical Anesthetic Applied: Xylocaine 4% Topical Solution Treatment Notes Electronic Signature(s) Signed: 05/19/2018 5:28:58 PM By: Montey Hora Entered By: Montey Hora on 05/19/2018 12:03:27 Morreale, [name] ([id number]) -------------------------------------------------------------------------------- Multi-Disciplinary Care Plan Details Patient Name: [name] Date of Service: 05/19/2018 10:30 AM Medical Record Number: [id number] Patient Account Number: [id number] Date of Birth/Sex: 1931/07/09 (84 y.o. M) Treating RN: Montey Hora Primary Care Thorin Starner: Delight Stare Other Clinician: Referring Jerral Mccauley: Elisabeth Cara Treating Maxine Huynh/Extender: Melburn Hake, HOYT Weeks in Treatment: 0 Active Inactive Abuse / Safety / Falls / Self Care Management Nursing Diagnoses: Potential for falls Goals: Patient will not experience any injury related to falls Date Initiated: 05/19/2018 Target Resolution Date: 08/15/2018 Goal Status: Active Interventions: Assess fall risk on admission and as needed Notes: Orientation to the Wound Care Program Nursing Diagnoses: Knowledge deficit related to the wound healing center program Goals: Patient/caregiver will verbalize understanding of the Agency Program Date Initiated: 05/19/2018 Target Resolution Date: 08/15/2018 Goal Status: Active Interventions: Provide education on orientation to the wound center Notes: Venous Leg Ulcer Nursing Diagnoses: Potential for venous Insuffiency (use before diagnosis confirmed) Goals: Patient will maintain optimal edema control Date Initiated: 05/19/2018 Target  Resolution Date: 08/15/2018 Goal Status: Active Interventions: Compression as ordered Grahn, Wylder R. ([id number]) Notes: Wound/Skin Impairment Nursing Diagnoses: Impaired tissue integrity Goals: Ulcer/skin breakdown will heal within 14 weeks Date Initiated: 05/19/2018 Target Resolution Date: 08/15/2018 Goal Status: Active Interventions: Assess ulceration(s) every visit Notes: Electronic Signature(s) Signed: 05/19/2018 5:28:58 PM By: Montey Hora Entered By: Montey Hora on 05/19/2018 12:03:16 Matsushita, [name] ([id number]) -------------------------------------------------------------------------------- Pain Assessment Details Patient Name: [name] Date of Service: 05/19/2018 10:30 AM Medical Record Number: [id number] Patient Account Number: [id number] Date of Birth/Sex: Sep 08, 1931 (84 y.o. M) Treating RN: Harold Barban Primary Care Wylie Coon: Delight Stare Other Clinician: Referring Ezekial Arns: Elisabeth Cara Treating Adit Riddles/Extender: STONE III, HOYT Weeks in Treatment: 0 Active Problems Location of Pain Severity and Description of Pain Patient Has Paino No Site Locations Pain Management and Medication Current Pain Management: Electronic Signature(s) Signed: 05/19/2018 5:05:35 PM By: Harold Barban Entered By: Harold Barban on 05/19/2018 10:53:39 Warda, [name] ([id number]) -------------------------------------------------------------------------------- Patient/Caregiver Education Details Patient Name: [name] Date of Service: 05/19/2018 10:30 AM Medical Record Number: [id number] Patient Account Number: [id number] Date of Birth/Gender: May 20, 1932 (84 y.o. M) Treating RN: Montey Hora Primary Care Physician: Delight Stare Other Clinician: Referring Physician: Elisabeth Cara Treating Physician/Extender: Sharalyn Ink in Treatment: 0 Education Assessment Education Provided To: Patient Education Topics Provided Venous: Handouts: Other: leg  elevation Methods: Explain/Verbal Responses: State content correctly Electronic Signature(s) Signed: 05/19/2018 5:28:58 PM By: Marjory Lies,  Di Kindle Entered By: Montey Hora on 05/19/2018 12:06:24 [name] ([id number]) -------------------------------------------------------------------------------- Wound Assessment Details Patient Name: [name] Date of Service: 05/19/2018 10:30 AM Medical Record Number: [id number] Patient Account Number: [id number] Date of Birth/Sex: 28-Jan-1932 (84 y.o. M) Treating RN: Harold Barban Primary Care Vineta Carone: Delight Stare Other Clinician: Referring Laurie Lovejoy: Elisabeth Cara Treating Neelam Tiggs/Extender: STONE III, HOYT Weeks in Treatment: 0 Wound Status Wound Number: 1 Primary Diabetic Wound/Ulcer of the Lower Extremity Etiology: Wound Location: Right Lower Leg - Medial Wound Open Wounding Event: Blister Status: Date Acquired: 04/19/2018 Comorbid Cataracts, Hypertension, Peripheral Venous Weeks Of Treatment: 0 History: Disease, Type II Diabetes, End Stage Renal Clustered Wound: No Disease Photos Photo Uploaded By: Harold Barban on 05/19/2018 11:48:00 Wound Measurements Length: (cm) 5 Width: (cm) 2.5 Depth: (cm) 0.1 Area: (cm) 9.817 Volume: (cm) 0.982 % Reduction in Area: 0% % Reduction in Volume: 0% Epithelialization: Small (1-33%) Tunneling: No Undermining: No Wound Description Classification: Grade 2 Wound Margin: Flat and Intact Exudate Amount: Medium Exudate Type: Serosanguineous Exudate Color: red, brown Foul Odor After Cleansing: No Slough/Fibrino Yes Wound Bed Granulation Amount: Small (1-33%) Exposed Structure Granulation Quality: Red, Pink Fascia Exposed: No Necrotic Amount: None Present (0%) Fat Layer (Subcutaneous Tissue) Exposed: Yes Tendon Exposed: No Muscle Exposed: No Joint Exposed: No Bone Exposed: No Periwound Skin Texture Dehne, Demauri R. ([id number]) Texture Color No Abnormalities Noted: No No  Abnormalities Noted: No Callus: No Atrophie Blanche: No Crepitus: No Cyanosis: No Excoriation: No Ecchymosis: No Induration: No Erythema: No Rash: No Hemosiderin Staining: Yes Scarring: Yes Mottled: No Pallor: No Moisture Rubor: No No Abnormalities Noted: No Dry / Scaly: Yes Temperature / Pain Maceration: No Temperature: No Abnormality Tenderness on Palpation: Yes Wound Preparation Ulcer Cleansing: Rinsed/Irrigated with Saline Topical Anesthetic Applied: Xylocaine 4% Topical Solution Treatment Notes Wound #1 (Right, Medial Lower Leg) Notes hydrafera blue, ABD, 3 layer wrap Electronic Signature(s) Signed: 05/19/2018 5:05:35 PM By: Harold Barban Entered By: Harold Barban on 05/19/2018 11:38:05 Underberg, [name] ([id number]) -------------------------------------------------------------------------------- Vitals Details Patient Name: [name] Date of Service: 05/19/2018 10:30 AM Medical Record Number: [id number] Patient Account Number: [id number] Date of Birth/Sex: [birth year] (84 y.o. M) Treating RN: Harold Barban Primary Care Tonae Livolsi: Delight Stare Other Clinician: Referring Zuley Lutter: Elisabeth Cara Treating Nikisha Fleece/Extender: STONE III, HOYT Weeks in Treatment: 0 Vital Signs Time Taken: 10:55 Temperature (F): 97.6 Height (in): 68 Pulse (bpm): 61 Source: Stated Respiratory Rate (breaths/min): 16 Weight (lbs): 150 Blood Pressure (mmHg): 127/55 Source: Stated Capillary Blood Glucose (mg/dl): 205 Body Mass Index (BMI): 22.8 Reference Range: 80 - 120 mg / dl Electronic Signature(s) Signed: 05/19/2018 5:05:35 PM By: Harold Barban Entered By: Harold Barban on 05/19/2018 10:59:02

## 2018-05-26 ENCOUNTER — Encounter: Payer: Medicare Other | Admitting: Physician Assistant

## 2018-05-26 DIAGNOSIS — E11622 Type 2 diabetes mellitus with other skin ulcer: Secondary | ICD-10-CM | POA: Diagnosis not present

## 2018-05-27 ENCOUNTER — Encounter (INDEPENDENT_AMBULATORY_CARE_PROVIDER_SITE_OTHER): Payer: Self-pay | Admitting: Nurse Practitioner

## 2018-05-27 ENCOUNTER — Ambulatory Visit (INDEPENDENT_AMBULATORY_CARE_PROVIDER_SITE_OTHER): Payer: Medicare Other | Admitting: Nurse Practitioner

## 2018-05-27 ENCOUNTER — Other Ambulatory Visit (INDEPENDENT_AMBULATORY_CARE_PROVIDER_SITE_OTHER): Payer: Self-pay | Admitting: Vascular Surgery

## 2018-05-27 ENCOUNTER — Ambulatory Visit (INDEPENDENT_AMBULATORY_CARE_PROVIDER_SITE_OTHER): Payer: Medicare Other

## 2018-05-27 VITALS — BP 142/61 | HR 74 | Resp 14 | Ht 70.0 in | Wt 150.0 lb

## 2018-05-27 DIAGNOSIS — Z9862 Peripheral vascular angioplasty status: Secondary | ICD-10-CM | POA: Diagnosis not present

## 2018-05-27 DIAGNOSIS — L97211 Non-pressure chronic ulcer of right calf limited to breakdown of skin: Secondary | ICD-10-CM

## 2018-05-27 DIAGNOSIS — I872 Venous insufficiency (chronic) (peripheral): Secondary | ICD-10-CM | POA: Diagnosis not present

## 2018-05-27 DIAGNOSIS — I739 Peripheral vascular disease, unspecified: Secondary | ICD-10-CM | POA: Diagnosis not present

## 2018-05-27 DIAGNOSIS — I10 Essential (primary) hypertension: Secondary | ICD-10-CM

## 2018-05-27 NOTE — Progress Notes (Signed)
NAOL, ONTIVEROS ([id number]) Visit Report for 05/26/2018 Allergy List Details Patient Name: [name], [name] Date of Service: 05/26/2018 11:15 AM Medical Record Number: [id number] Patient Account Number: [id number] Date of Birth/Sex: [birth year] (84 y.o. M) Treating RN: Montey Hora Primary Care Storm Sovine: Delight Stare Other Clinician: Referring Zyan Coby: Delight Stare Treating Lucilia Yanni/Extender: STONE III, HOYT Weeks in Treatment: 1 Allergies Active Allergies penicillin Reaction: hives Severity: Mild Allergy Notes Electronic Signature(s) Signed: 05/26/2018 5:03:08 PM By: Montey Hora Entered By: Montey Hora on 05/26/2018 12:24:05 Stephanie, [name] ([id number]) -------------------------------------------------------------------------------- Arrival Information Details Patient Name: [name] Date of Service: 05/26/2018 11:15 AM Medical Record Number: [id number] Patient Account Number: [id number] Date of Birth/Sex: [birth year] (84 y.o. M) Treating RN: Montey Hora Primary Care Elisha Mcgruder: Delight Stare Other Clinician: Referring Adelae Yodice: Delight Stare Treating Mathilde Mcwherter/Extender: Melburn Hake, HOYT Weeks in Treatment: 1 Visit Information Patient Arrived: Ambulatory Arrival Time: 11:17 Accompanied By: daughter Transfer Assistance: None History Since Last Visit Added or deleted any medications: No Any new allergies or adverse reactions: No Had a fall or experienced change in activities of daily living that may affect risk of falls: No Signs or symptoms of abuse/neglect since last visito No Hospitalized since last visit: No Implantable device outside of the clinic excluding cellular tissue based products placed in the center since last visit: No Has Dressing in Place as Prescribed: Yes Electronic Signature(s) Signed: 05/26/2018 4:40:17 PM By: Lorine Bears RCP, RRT, CHT Entered By: Lorine Bears on 05/26/2018 11:18:48 Hodgkins, [name]  ([id number]) -------------------------------------------------------------------------------- Compression Therapy Details Patient Name: [name] Date of Service: 05/26/2018 11:15 AM Medical Record Number: [id number] Patient Account Number: [id number] Date of Birth/Sex: 1932-06-08 (84 y.o. M) Treating RN: Montey Hora Primary Care Breyon Blass: Delight Stare Other Clinician: Referring Kashden Deboy: Delight Stare Treating Jermall Isaacson/Extender: STONE III, HOYT Weeks in Treatment: 1 Compression Therapy Performed for Wound Assessment: Wound #1 Right,Medial Lower Leg Performed By: Clinician Montey Hora, RN Compression Type: Three Layer Pre Treatment ABI: 0.9 Post Procedure Diagnosis Same as Pre-procedure Electronic Signature(s) Signed: 05/26/2018 5:03:08 PM By: Montey Hora Entered By: Montey Hora on 05/26/2018 12:23:21 JASIRI, HANAWALT ([id number]) -------------------------------------------------------------------------------- Encounter Discharge Information Details Patient Name: [name] Date of Service: 05/26/2018 11:15 AM Medical Record Number: [id number] Patient Account Number: [id number] Date of Birth/Sex: 1931-08-13 (84 y.o. M) Treating RN: Montey Hora Primary Care Cresencio Reesor: Delight Stare Other Clinician: Referring Anedra Penafiel: Delight Stare Treating Sajid Ruppert/Extender: Melburn Hake, HOYT Weeks in Treatment: 1 Encounter Discharge Information Items Discharge Condition: Stable Ambulatory Status: Ambulatory Discharge Destination: Home Transportation: Private Auto Accompanied By: daughter Schedule Follow-up Appointment: Yes Clinical Summary of Care: Electronic Signature(s) Signed: 05/26/2018 5:03:08 PM By: Montey Hora Entered By: Montey Hora on 05/26/2018 13:03:33 Dilworth, [name] ([id number]) -------------------------------------------------------------------------------- Lower Extremity Assessment Details Patient Name: [name] Date of Service: 05/26/2018 11:15  AM Medical Record Number: [id number] Patient Account Number: [id number] Date of Birth/Sex: Oct 16, 1931 (84 y.o. M) Treating RN: Cornell Barman Primary Care Justyce Baby: Delight Stare Other Clinician: Referring Remy Dia: Delight Stare Treating Tnya Ades/Extender: Melburn Hake, HOYT Weeks in Treatment: 1 Edema Assessment Assessed: [Left: No] [Right: No] [Left: Edema] [Right: :] Calf Left: Right: Point of Measurement: 34 cm From Medial Instep cm 33.4 cm Ankle Left: Right: Point of Measurement: 12 cm From Medial Instep cm 23.3 cm Vascular Assessment Pulses: Dorsalis Pedis Palpable: [Right:Yes] Posterior Tibial Extremity colors, hair growth, and conditions: Extremity Color: [Right:Hyperpigmented] Hair Growth on Extremity: [Right:No] Temperature of Extremity: [Right:Warm] Capillary Refill: [Right:< 3 seconds] Toe Nail Assessment Left: Right: Thick:  No Discolored: No Deformed: No Improper Length and Hygiene: No Notes Patient has 2+ pitting edema in right leg. Wrap was not done properly (kerlix and coban only from 3cm above ankle to mid calf. Electronic Signature(s) Signed: 05/27/2018 8:39:20 AM By: Gretta Cool, BSN, RN, CWS, Kim RN, BSN Entered By: Gretta Cool, BSN, RN, CWS, Kim on 05/26/2018 11:31:44 TASHAUN, OBEY ([id number]) -------------------------------------------------------------------------------- Multi Wound Chart Details Patient Name: [name] Date of Service: 05/26/2018 11:15 AM Medical Record Number: [id number] Patient Account Number: [id number] Date of Birth/Sex: 09-27-1931 (84 y.o. M) Treating RN: Montey Hora Primary Care Leopold Smyers: Delight Stare Other Clinician: Referring Willeen Novak: Delight Stare Treating Khalani Novoa/Extender: STONE III, HOYT Weeks in Treatment: 1 Vital Signs Height(in): 68 Pulse(bpm): 57 Weight(lbs): 150 Blood Pressure(mmHg): 138/48 Body Mass Index(BMI): 23 Temperature(F): 97.5 Respiratory Rate 16 (breaths/min): Photos: [1:No Photos] [N/A:N/A] Wound  Location: [1:Right Lower Leg - Medial] [N/A:N/A] Wounding Event: [1:Blister] [N/A:N/A] Primary Etiology: [1:Diabetic Wound/Ulcer of the Lower Extremity] [N/A:N/A] Comorbid History: [1:Cataracts, Hypertension, Peripheral Venous Disease, Type II Diabetes, End Stage Renal Disease] [N/A:N/A] Date Acquired: [1:04/19/2018] [N/A:N/A] Weeks of Treatment: [1:1] [N/A:N/A] Wound Status: [1:Open] [N/A:N/A] Measurements L x W x D [1:3.5x1.5x0.1] [N/A:N/A] (cm) Area (cm) : [1:4.123] [N/A:N/A] Volume (cm) : [1:0.412] [N/A:N/A] % Reduction in Area: [1:58.00%] [N/A:N/A] % Reduction in Volume: [1:58.00%] [N/A:N/A] Classification: [1:Grade 2] [N/A:N/A] Exudate Amount: [1:Medium] [N/A:N/A] Exudate Type: [1:Serosanguineous] [N/A:N/A] Exudate Color: [1:red, brown] [N/A:N/A] Wound Margin: [1:Flat and Intact] [N/A:N/A] Granulation Amount: [1:Large (67-100%)] [N/A:N/A] Granulation Quality: [1:Red] [N/A:N/A] Necrotic Amount: [1:Small (1-33%)] [N/A:N/A] Exposed Structures: [1:Fat Layer (Subcutaneous Tissue) Exposed: Yes Fascia: No Tendon: No Muscle: No Joint: No Bone: No] [N/A:N/A] Epithelialization: [1:Small (1-33%)] [N/A:N/A] Periwound Skin Texture: [1:Scarring: Yes Excoriation: No Induration: No] [N/A:N/A] Callus: No Crepitus: No Rash: No Periwound Skin Moisture: Dry/Scaly: Yes N/A N/A Maceration: No Periwound Skin Color: Hemosiderin Staining: Yes N/A N/A Atrophie Blanche: No Cyanosis: No Ecchymosis: No Erythema: No Mottled: No Pallor: No Rubor: No Temperature: No Abnormality N/A N/A Tenderness on Palpation: Yes N/A N/A Wound Preparation: Ulcer Cleansing: N/A N/A Rinsed/Irrigated with Saline Topical Anesthetic Applied: Other: lidocaine 4% Treatment Notes Electronic Signature(s) Signed: 05/26/2018 5:03:08 PM By: Montey Hora Entered By: Montey Hora on 05/26/2018 12:18:24 JAYVIAN, ESCOE  ([id number]) -------------------------------------------------------------------------------- Multi-Disciplinary Care Plan Details Patient Name: [name] Date of Service: 05/26/2018 11:15 AM Medical Record Number: [id number] Patient Account Number: [id number] Date of Birth/Sex: 1932/01/15 (84 y.o. M) Treating RN: Montey Hora Primary Care Kendale Rembold: Delight Stare Other Clinician: Referring Trina Asch: Delight Stare Treating Giuliano Preece/Extender: Melburn Hake, HOYT Weeks in Treatment: 1 Active Inactive Abuse / Safety / Falls / Self Care Management Nursing Diagnoses: Potential for falls Goals: Patient will not experience any injury related to falls Date Initiated: 05/19/2018 Target Resolution Date: 08/15/2018 Goal Status: Active Interventions: Assess fall risk on admission and as needed Notes: Orientation to the Wound Care Program Nursing Diagnoses: Knowledge deficit related to the wound healing center program Goals: Patient/caregiver will verbalize understanding of the [facility] Date Initiated: 05/19/2018 Target Resolution Date: 08/15/2018 Goal Status: Active Interventions: Provide education on orientation to the wound center Notes: Venous Leg Ulcer Nursing Diagnoses: Potential for venous Insuffiency (use before diagnosis confirmed) Goals: Patient will maintain optimal edema control Date Initiated: 05/19/2018 Target Resolution Date: 08/15/2018 Goal Status: Active Interventions: Compression as ordered Milone, Frisco R. ([id number]) Notes: Wound/Skin Impairment Nursing Diagnoses: Impaired tissue integrity Goals: Ulcer/skin breakdown will heal within 14 weeks Date Initiated: 05/19/2018 Target Resolution Date: 08/15/2018 Goal Status: Active Interventions: Assess ulceration(s) every visit Notes:  Electronic Signature(s) Signed: 05/26/2018 5:03:08 PM By: Montey Hora Entered By: Montey Hora on 05/26/2018 12:17:54 Panek, [name]  ([id number]) -------------------------------------------------------------------------------- Pain Assessment Details Patient Name: [name] Date of Service: 05/26/2018 11:15 AM Medical Record Number: [id number] Patient Account Number: [id number] Date of Birth/Sex: December 27, 1931 (84 y.o. M) Treating RN: Montey Hora Primary Care Stanislawa Gaffin: Delight Stare Other Clinician: Referring Verlon Pischke: Delight Stare Treating Sophiah Rolin/Extender: STONE III, HOYT Weeks in Treatment: 1 Active Problems Location of Pain Severity and Description of Pain Patient Has Paino No Site Locations Pain Management and Medication Current Pain Management: Electronic Signature(s) Signed: 05/26/2018 4:40:17 PM By: Lorine Bears RCP, RRT, CHT Signed: 05/26/2018 5:03:08 PM By: Montey Hora Entered By: Lorine Bears on 05/26/2018 11:18:54 Bostelman, [name] ([id number]) -------------------------------------------------------------------------------- Patient/Caregiver Education Details Patient Name: [name] Date of Service: 05/26/2018 11:15 AM Medical Record Number: [id number] Patient Account Number: [id number] Date of Birth/Gender: [birth year] (84 y.o. M) Treating RN: Montey Hora Primary Care Physician: Delight Stare Other Clinician: Referring Physician: Delight Stare Treating Physician/Extender: Sharalyn Ink in Treatment: 1 Education Assessment Education Provided To: Patient and Caregiver Education Topics Provided Venous: Handouts: Other: leg wraps Methods: Explain/Verbal Responses: State content correctly Electronic Signature(s) Signed: 05/26/2018 5:03:08 PM By: Montey Hora Entered By: Montey Hora on 05/26/2018 12:23:50 Goldberg, [name] ([id number]) -------------------------------------------------------------------------------- Wound Assessment Details Patient Name: [name] Date of Service: 05/26/2018 11:15 AM Medical Record Number: [id number] Patient  Account Number: [id number] Date of Birth/Sex: [birth year] (84 y.o. M) Treating RN: Cornell Barman Primary Care Miasha Emmons: Delight Stare Other Clinician: Referring Sieara Bremer: Delight Stare Treating Rahel Carlton/Extender: STONE III, HOYT Weeks in Treatment: 1 Wound Status Wound Number: 1 Primary Diabetic Wound/Ulcer of the Lower Extremity Etiology: Wound Location: Right Lower Leg - Medial Wound Open Wounding Event: Blister Status: Date Acquired: 04/19/2018 Comorbid Cataracts, Hypertension, Peripheral Venous Weeks Of Treatment: 1 History: Disease, Type II Diabetes, End Stage Renal Clustered Wound: No Disease Wound Measurements Length: (cm) 3.5 Width: (cm) 1.5 Depth: (cm) 0.1 Area: (cm) 4.123 Volume: (cm) 0.412 % Reduction in Area: 58% % Reduction in Volume: 58% Epithelialization: Small (1-33%) Tunneling: No Undermining: No Wound Description Classification: Grade 2 Wound Margin: Flat and Intact Exudate Amount: Medium Exudate Type: Serosanguineous Exudate Color: red, brown Foul Odor After Cleansing: No Slough/Fibrino Yes Wound Bed Granulation Amount: Large (67-100%) Exposed Structure Granulation Quality: Red Fascia Exposed: No Necrotic Amount: Small (1-33%) Fat Layer (Subcutaneous Tissue) Exposed: Yes Necrotic Quality: Adherent Slough Tendon Exposed: No Muscle Exposed: No Joint Exposed: No Bone Exposed: No Periwound Skin Texture Texture Color No Abnormalities Noted: No No Abnormalities Noted: No Callus: No Atrophie Blanche: No Crepitus: No Cyanosis: No Excoriation: No Ecchymosis: No Induration: No Erythema: No Rash: No Hemosiderin Staining: Yes Scarring: Yes Mottled: No Pallor: No Moisture Rubor: No No Abnormalities Noted: No Dry / Scaly: Yes Temperature / Pain Maceration: No Temperature: No Abnormality Overbey, Hermenegildo R. ([id number]) Tenderness on Palpation: Yes Wound Preparation Ulcer Cleansing: Rinsed/Irrigated with Saline Topical Anesthetic Applied: Other:  lidocaine 4%, Treatment Notes Wound #1 (Right, Medial Lower Leg) Notes mepitel, hydrafera blue, ABD, 3 layer wrap Electronic Signature(s) Signed: 05/27/2018 8:39:20 AM By: Gretta Cool, BSN, RN, CWS, Kim RN, BSN Entered By: Gretta Cool, BSN, RN, CWS, Kim on 05/26/2018 11:28:14 STCLAIR, SZYMBORSKI ([id number]) -------------------------------------------------------------------------------- Vitals Details Patient Name: [name] Date of Service: 05/26/2018 11:15 AM Medical Record Number: [id number] Patient Account Number: [id number] Date of Birth/Sex: 1932/03/05 (84 y.o. M) Treating RN: Montey Hora Primary Care Aria Pickrell: Delight Stare Other Clinician: Referring Lorenza Winkleman:  MILES, LINDA Treating [place] Shannahan/Extender: STONE III, HOYT Weeks in Treatment: 1 Vital Signs Time Taken: 11:18 Temperature (F): 97.5 Height (in): 68 Pulse (bpm): 66 Weight (lbs): 150 Respiratory Rate (breaths/min): 16 Body Mass Index (BMI): 22.8 Blood Pressure (mmHg): 138/48 Reference Range: 80 - 120 mg / dl Electronic Signature(s) Signed: 05/26/2018 4:40:17 PM By: Lorine Bears RCP, RRT, CHT Entered By: Lorine Bears on 05/26/2018 11:21:46

## 2018-05-28 NOTE — Progress Notes (Signed)
[name], [name] ([id number]) Visit Report for 05/26/2018 Chief Complaint Document Details Patient Name: [name], [name] Date of Service: 05/26/2018 11:15 AM Medical Record Number: [id number] Patient Account Number: [id number] Date of Birth/Sex: 1932-01-10 (84 y.o. [name]) Treating RN: [name] Primary Care Provider: Delight [name] Other Clinician: Referring Provider: Delight [name] Treating Provider/Extender: [name], [name]: 1 Information Obtained from: Patient Chief Complaint Right LE Ulcer Electronic Signature(s) Signed: 05/26/2018 6:07:41 PM By: [name] Keeler PA-C Entered By: [name] on 05/26/2018 11:43:36 [name], [name] ([id number]) -------------------------------------------------------------------------------- HPI Details Patient Name: [name] Date of Service: 05/26/2018 11:15 AM Medical Record Number: [id number] Patient Account Number: [id number] Date of Birth/Sex: [birth year] (84 y.o. [name]) Treating RN: [name] Primary Care Provider: Delight [name] Other Clinician: Referring Provider: Delight [name] Treating Provider/Extender: [name], [name]: 1 History of Present Illness HPI Description: 05/19/18 on evaluation today patient presents for initial evaluation and clinic concerning issues that he has been having with his right medial lower leg ulcer. The patient has actually been seen in the emergency department for cellulitis he was admitted at [facility] on 04/19/18 discharged 04/21/18. He was noted to have proof of vascular disease and did undergo an angiogram on 04/20/18 by Dr. Leotis [name]. 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 Subsequently [name] following intervention noted that the patient had depending on the location 20-40% residual stenosis noted and significant improvement in the overall vascular flow. Obviously  this is great news. With that being said the patient's wound he states does seem to be doing some better compared to previous. Upon inspection indeed it appears that he has some epithelialization. Overall I do not see any evidence of significant infection which is great news. No fevers chills noted 05/26/18 on evaluation today patient's wound actually appears to be doing much better at this time. With that being said I do think the [name] Dressing a be sticking according to my nurse and this may be causing some issues as well. I'[name] gonna suggest at this point that we likely add mepitel underneath the [name] Dressing help prevent this. Other than that everything seems to be doing excellent. Electronic Signature(s) Signed: 05/26/2018 6:07:41 PM By: [name] Keeler PA-C Entered By: [name] on 05/26/2018 17:38:06 [name], [name] ([id number]) -------------------------------------------------------------------------------- Physical Exam Details Patient Name: [name] Date of Service: 05/26/2018 11:15 AM Medical Record Number: [id number] Patient Account Number: [id number] Date of Birth/Sex: 08/04/1931 (84 y.o. [name]) Treating RN: [name] Primary Care Provider: Delight [name] Other Clinician: Referring Provider: Delight [name] Treating Provider/Extender: STONE III, [name]: 1 Constitutional Well-nourished and well-hydrated in no acute distress. Respiratory normal breathing without difficulty. clear to auscultation bilaterally. Cardiovascular regular rate and rhythm with normal S1, S2. Psychiatric this patient is able to make decisions and demonstrates good insight into disease process. Alert and Oriented x 3. pleasant and cooperative. Notes Patient's wound bed currently shows signs of good granulation which is great news he also has good epithelialization which is awesome. There does not appear to show any signs of this point of infection or other  issues. I really think the biggest issue is that we need to go ahead and use the mepitel in order to prevent any sticking of the [facility] Dressing. No sharp debridement necessary. Electronic Signature(s) Signed: 05/26/2018 6:07:41 PM By:  [name], Hoyt PA-C Entered By: [name] on 05/26/2018 17:38:48 [name], [name] ([id number]) -------------------------------------------------------------------------------- Physician Orders Details Patient Name: [name] Date of Service: 05/26/2018 11:15 AM Medical Record Number: [id number] Patient Account Number: [id number] Date of Birth/Sex: [birth year] (84 y.o. [name]) Treating RN: [name] Primary Care Provider: Delight [name] Other Clinician: Referring Provider: Delight [name] Treating Provider/Extender: [name], [name]: 1 Verbal / Phone Orders: No Diagnosis Coding ICD-10 Coding Code Description E11.622 Type 2 diabetes mellitus with other skin ulcer L97.812 Non-pressure chronic ulcer of other part of right lower leg with fat layer exposed I73.89 Other specified peripheral vascular diseases I10 Essential (primary) hypertension N18.4 Chronic kidney disease, [name] 4 (severe) Wound Cleansing Wound #1 Right,Medial Lower Leg o Clean wound with Normal Saline. o Cleanse wound with mild soap and water Primary Wound Dressing Wound #1 Right,Medial Lower Leg o Mepitel One Contact layer - or adaptic/oil emulsion contact layer o Hydrafera Blue Ready Transfer Secondary Dressing Wound #1 Right,Medial Lower Leg o ABD pad Dressing Change Frequency Wound #1 Right,Medial Lower Leg o Other: - Twice weekly - once by [facility] on Tuesdays and once by [facility] Follow-up Appointments Wound #1 Right,Medial Lower Leg o Return Appointment in 1 week. Edema Control Wound #1 Right,Medial Lower Leg o 3 Layer Compression System - Right Lower Extremity - LEG IS TO BE WRAPPED FROM THE TOES TO 3CM BELOW THE  KNEE o Elevate legs to the level of the heart and pump ankles as often as possible Home Health Wound #1 Right,Medial Lower Leg o [place] Nurse may visit PRN to address patientos wound care needs. [name], [name] ([id number]) o FACE TO FACE ENCOUNTER: MEDICARE and MEDICAID PATIENTS: I certify that this patient is under my care and that I had a face-to-face encounter that meets the physician face-to-face encounter requirements with this patient on this date. The encounter with the patient was in whole or in part for the following MEDICAL CONDITION: (primary reason for [place]) MEDICAL NECESSITY: I certify, that based on my findings, NURSING services are a medically necessary home health service. HOME BOUND STATUS: I certify that my clinical findings support that this patient is homebound (i.e., Due to illness or injury, pt requires aid of supportive devices such as crutches, cane, wheelchairs, walkers, the use of special transportation or the assistance of another person to leave their place of residence. There is a normal inability to leave the home and doing so requires considerable and taxing effort. Other absences are for medical reasons / religious services and are infrequent or of short duration when for other reasons). o If current dressing causes regression in wound condition, may D/C ordered dressing product/s and apply Normal Saline Moist Dressing daily until next [place] / Other MD appointment. [place] of regression in wound condition at [phone]. o Please direct any NON-WOUND related issues/requests for orders to patient's Primary Care Physician Electronic Signature(s) Signed: 05/26/2018 5:03:08 PM By: [name] Signed: 05/26/2018 6:07:41 PM By: [name] Keeler PA-C Entered By: [name] on 05/26/2018 12:21:46 [name], [name]  ([id number]) -------------------------------------------------------------------------------- Problem List Details Patient Name: [name] Date of Service: 05/26/2018 11:15 AM Medical Record Number: [id number] Patient Account Number: [id number] Date of Birth/Sex: Oct 16, 1931 (84 y.o. [name]) Treating RN: [name] Primary Care Provider: Delight [name] Other Clinician: Referring Provider: Delight [name] Treating Provider/Extender: STONE III, [name]: 1 Active Problems ICD-10 Evaluated Encounter  Code Description Active Date Today Diagnosis E11.622 Type 2 diabetes mellitus with other skin ulcer 05/19/2018 No Yes L97.812 Non-pressure chronic ulcer of other part of right lower leg 05/19/2018 No Yes with fat layer exposed I73.89 Other specified peripheral vascular diseases 05/19/2018 No Yes I10 Essential (primary) hypertension 05/19/2018 No Yes N18.4 Chronic kidney disease, [name] 4 (severe) 05/19/2018 No Yes Inactive Problems Resolved Problems Electronic Signature(s) Signed: 05/26/2018 6:07:41 PM By: [name] Keeler PA-C Entered By: [name] on 05/26/2018 11:43:32 Gorter, [name] Kitchen ([id number]) -------------------------------------------------------------------------------- Progress Note Details Patient Name: [name] Date of Service: 05/26/2018 11:15 AM Medical Record Number: [id number] Patient Account Number: [id number] Date of Birth/Sex: 12/15/1931 (84 y.o. [name]) Treating RN: [name] Primary Care Provider: Delight [name] Other Clinician: Referring Provider: Delight [name] Treating Provider/Extender: [name], [name]: 1 Subjective Chief Complaint Information obtained from Patient Right LE Ulcer History of Present Illness (HPI) 05/19/18 on evaluation today patient presents for initial evaluation and clinic concerning issues that he has been having with his right medial lower leg ulcer. The patient has actually been seen in the emergency  department for cellulitis he was admitted at [facility] on 04/19/18 discharged 04/21/18. He was noted to have proof of vascular disease and did undergo an angiogram on 04/20/18 by Dr. Leotis [name]. 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 Subsequently [name] following intervention noted that the patient had depending on the location 20-40% residual stenosis noted and significant improvement in the overall vascular flow. Obviously this is great news. With that being said the patient's wound he states does seem to be doing some better compared to previous. Upon inspection indeed it appears that he has some epithelialization. Overall I do not see any evidence of significant infection which is great news. No fevers chills noted 05/26/18 on evaluation today patient's wound actually appears to be doing much better at this time. With that being said I do think the [facility] Dressing a be sticking according to my nurse and this may be causing some issues as well. I'[name] gonna suggest at this point that we likely add mepitel underneath the [facility] Dressing help prevent this. Other than that everything seems to be doing excellent. Patient History Information obtained from Patient. Allergies penicillin (Severity: Mild, Reaction: hives) Family History Cancer - Mother, Diabetes - Siblings, Hypertension - Siblings, Stroke - Siblings, No family history of Heart Disease, Hereditary Spherocytosis, Kidney Disease, Lung Disease, Seizures, Thyroid Problems, Tuberculosis. Social History Never smoker, Alcohol Use - Never, Drug Use - No History, Caffeine Use - Rarely. Medical And Surgical History Notes Eyes Detached Retina 1980 Review of Systems (ROS) Constitutional Symptoms (General Health) Denies complaints or symptoms of Fever, Chills. Respiratory The patient has no complaints or  symptoms. Cardiovascular Mcburney, Archer ([id number]) Complains or has symptoms of LE edema. Psychiatric The patient has no complaints or symptoms. Objective Constitutional Well-nourished and well-hydrated in no acute distress. Vitals Time Taken: 11:18 AM, Height: 68 in, Weight: 150 lbs, BMI: 22.8, Temperature: 97.5 F, Pulse: 66 bpm, Respiratory Rate: 16 breaths/min, Blood Pressure: 138/48 mmHg. Respiratory normal breathing without difficulty. clear to auscultation bilaterally. Cardiovascular regular rate and rhythm with normal S1, S2. Psychiatric this patient is able to make decisions and demonstrates good insight into disease process. Alert and Oriented x 3. pleasant and cooperative. General Notes: Patient's wound bed currently shows signs of good  granulation which is great news he also has good epithelialization which is awesome. There does not appear to show any signs of this point of infection or other issues. I really think the biggest issue is that we need to go ahead and use the mepitel in order to prevent any sticking of the [name] Dressing. No sharp debridement necessary. Integumentary (Hair, Skin) Wound #1 status is Open. Original cause of wound was Blister. The wound is located on the Right,Medial Lower Leg. The wound measures 3.5cm length x 1.5cm width x 0.1cm depth; 4.123cm^2 area and 0.412cm^3 volume. There is Fat Layer (Subcutaneous Tissue) Exposed exposed. There is no tunneling or undermining noted. There is a medium amount of serosanguineous drainage noted. The wound margin is flat and intact. There is large (67-100%) red granulation within the wound bed. There is a small (1-33%) amount of necrotic tissue within the wound bed including Adherent Slough. The periwound skin appearance exhibited: Scarring, Dry/Scaly, Hemosiderin Staining. The periwound skin appearance did not exhibit: Callus, Crepitus, Excoriation, Induration, Rash, Maceration, Atrophie Blanche,  Cyanosis, Ecchymosis, Mottled, Pallor, Rubor, Erythema. Periwound temperature was noted as No Abnormality. The periwound has tenderness on palpation. Assessment Active Problems ICD-10 Type 2 diabetes mellitus with other skin ulcer Non-pressure chronic ulcer of other part of right lower leg with fat layer exposed Other specified peripheral vascular diseases [name], [name] R. ([id number]) Essential (primary) hypertension Chronic kidney disease, [name] 4 (severe) Procedures Wound #1 Pre-procedure diagnosis of Wound #1 is a Diabetic Wound/Ulcer of the Lower Extremity located on the Right,Medial Lower Leg . There was a Three Layer Compression Therapy Procedure with a pre-[name] ABI of 0.9 by [name] Hora, RN. Post procedure Diagnosis Wound #1: Same as Pre-Procedure Plan Wound Cleansing: Wound #1 Right,Medial Lower Leg: Clean wound with Normal Saline. Cleanse wound with mild soap and water Primary Wound Dressing: Wound #1 Right,Medial Lower Leg: Mepitel One Contact layer - or adaptic/oil emulsion contact layer Hydrafera Blue Ready Transfer Secondary Dressing: Wound #1 Right,Medial Lower Leg: ABD pad Dressing Change Frequency: Wound #1 Right,Medial Lower Leg: Other: - Twice weekly - once by [facility] on Tuesdays and once by [facility] Follow-up Appointments: Wound #1 Right,Medial Lower Leg: Return Appointment in 1 week. Edema Control: Wound #1 Right,Medial Lower Leg: 3 Layer Compression System - Right Lower Extremity - LEG IS TO BE WRAPPED FROM THE TOES TO 3CM BELOW THE KNEE Elevate legs to the level of the heart and pump ankles as often as possible Home Health: Wound #1 Right,Medial Lower Leg: [place] Nurse may visit PRN to address patient s wound care needs. FACE TO FACE ENCOUNTER: MEDICARE and MEDICAID PATIENTS: I certify that this patient is under my care and that I had a face-to-face encounter that meets the physician face-to-face  encounter requirements with this patient on this date. The encounter with the patient was in whole or in part for the following MEDICAL CONDITION: (primary reason for [place]) MEDICAL NECESSITY: I certify, that based on my findings, NURSING services are a medically necessary home health service. HOME BOUND STATUS: I certify that my clinical findings support that this patient is homebound (i.e., Due to illness or injury, pt requires aid of supportive devices such as crutches, cane, wheelchairs, walkers, the use of special transportation or the assistance of another person to leave their place of residence. There is a normal inability to leave the home and doing so requires considerable and taxing effort. Other absences are for medical reasons /  religious services and are infrequent or of short duration when for other reasons). If current dressing causes regression in wound condition, may D/C ordered dressing product/s and apply Normal Saline [name], [name] R. ([id number]) Moist Dressing daily until next [place] / Other MD appointment. [place] of regression in wound condition at [phone]. Please direct any NON-WOUND related issues/requests for orders to patient's Primary Care Physician I am going to recommend currently that we go ahead and continue with the above wound care measures for the next week. The patient is in agreement with plan. We will double check with him help to ensure they are appropriately wrapping the patient's leg. This needs to be from just below the toes to three fingers below the knee. This was explained to the patient and his daughter today. Please see above for specific wound care orders. We will see patient for re-evaluation in 1 week(s) here in the clinic. If anything worsens or changes patient will contact our office for additional recommendations. Electronic Signature(s) Signed: 05/26/2018 6:07:41 PM By: [name] Keeler  PA-C Entered By: [name] on 05/26/2018 17:39:25 [name], [name] ([id number]) -------------------------------------------------------------------------------- ROS/PFSH Details Patient Name: [name] Date of Service: 05/26/2018 11:15 AM Medical Record Number: [id number] Patient Account Number: [id number] Date of Birth/Sex: July 15, 1931 (84 y.o. [name]) Treating RN: [name] Primary Care Provider: Delight [name] Other Clinician: Referring Provider: Delight [name] Treating Provider/Extender: STONE III, [name]: 1 Information Obtained From Patient Wound History Do you currently have one or more open woundso Yes How many open wounds do you currently haveo 1 Approximately how long have you had your woundso 3 months How have you been treating your wound(s) until nowo bandage, Home Health treating Has your wound(s) ever healed and then re-openedo No Have you had any lab work done in the past montho Yes Who ordered the lab work [place] Have you tested positive for an antibiotic resistant organism (MRSA, VRE)o No Have you tested positive for osteomyelitis (bone infection)o No Have you had any tests for circulation on your legso Yes Where was the test doneo [facility] Have you had other problems associated with your woundso Infection Constitutional Symptoms (General Health) Complaints and Symptoms: Negative for: Fever; Chills Cardiovascular Complaints and Symptoms: Positive for: LE edema Medical History: Positive for: Hypertension - takes medication; Peripheral Venous Disease Eyes Medical History: Positive for: Cataracts Negative for: Glaucoma; Optic Neuritis Past Medical History Notes: Detached Retina 1980 Hematologic/Lymphatic Medical History: Negative for: Anemia; Hemophilia; Human Immunodeficiency Virus; Lymphedema; Sickle Cell Disease Respiratory Complaints and Symptoms: No Complaints or Symptoms Medical  HistoryAVINASH, MALTOS. 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, loses protein Immunological Medical History: Negative for: Lupus Erythematosus; Raynaudos; Scleroderma Integumentary (Skin) Medical History: Negative for: History of Burn; History of pressure wounds Musculoskeletal Medical History: Negative for: Gout; Rheumatoid Arthritis; Osteoarthritis; Osteomyelitis Neurologic Medical History: Negative for: Dementia; Neuropathy; Quadriplegia; Paraplegia; Seizure Disorder Oncologic Medical History: Negative for: Received Chemotherapy; Received Radiation Psychiatric Complaints and Symptoms: No Complaints or Symptoms Medical History: Negative for: Anorexia/bulimia; Confinement Anxiety HBO Extended  History Items Eyes: Cataracts Haskett, Ysabel R. ([id number]) Immunizations Pneumococcal Vaccine: Received Pneumococcal Vaccination: Yes Implantable Devices Family and Social History Cancer: Yes - Mother; Diabetes: Yes - Siblings; Heart Disease: No; Hereditary Spherocytosis: No; Hypertension: Yes - Siblings; Kidney Disease: No; Lung Disease: No; Seizures: No; Stroke: Yes - Siblings; Thyroid Problems: No; Tuberculosis: No; Never smoker; Alcohol Use: Never; Drug Use: No History; Caffeine Use: Rarely; Financial Concerns: No; Food, Clothing or Shelter Needs: No; Support System Lacking: No; Transportation Concerns: No; Advanced Directives: Yes (Not Provided); Patient does not want information on Advanced Directives; Living  Will: Yes (Not Provided) Physician Affirmation I have reviewed and agree with the above information. Electronic Signature(s) Signed: 05/26/2018 6:07:41 PM By: [name] Keeler PA-C Signed: 05/27/2018 5:14:34 PM By: [name] Entered By: [name] on 05/26/2018 17:38:23 Ruddy, [name] ([id number]) -------------------------------------------------------------------------------- SuperBill Details Patient Name: [name] Date of Service: 05/26/2018 Medical Record Number: [id number] Patient Account Number: [id number] Date of Birth/Sex: 08/09/1931 (84 y.o. [name]) Treating RN: [name] Primary Care Provider: Delight [name] Other Clinician: Referring Provider: Delight [name] Treating Provider/Extender: [name], [name]: 1 Diagnosis Coding ICD-10 Codes Code Description E11.622 Type 2 diabetes mellitus with other skin ulcer L97.812 Non-pressure chronic ulcer of other part of right lower leg with fat layer exposed I73.89 Other specified peripheral vascular diseases I10 Essential (primary) hypertension N18.4 Chronic kidney disease, [name] 4 (severe) Facility Procedures CPT4 Code Description: [id number] (Facility Use Only) [phone] - APPLY MULTLAY COMPRS LWR RT LEG Modifier: Quantity: 1 Physician Procedures CPT4 Code Description: [id number] - WC PHYS LEVEL 4 - EST PT ICD-10 Diagnosis Description E11.622 Type 2 diabetes mellitus with other skin ulcer L97.812 Non-pressure chronic ulcer of other part of right lower leg wi I73.89 Other specified peripheral  vascular diseases I10 Essential (primary) hypertension Modifier: th fat layer expos Quantity: 1 ed Electronic Signature(s) Signed: 05/26/2018 6:07:41 PM By: [name] Keeler PA-C Previous Signature: 05/26/2018 5:03:08 PM Version By: [name] Entered By: [name] on 05/26/2018 17:39:44

## 2018-05-29 ENCOUNTER — Encounter (INDEPENDENT_AMBULATORY_CARE_PROVIDER_SITE_OTHER): Payer: Self-pay | Admitting: Nurse Practitioner

## 2018-05-29 DIAGNOSIS — I739 Peripheral vascular disease, unspecified: Secondary | ICD-10-CM | POA: Insufficient documentation

## 2018-05-29 NOTE — Progress Notes (Signed)
Subjective:    Patient ID: [name], male    DOB: Sep 24, 1931, 84 y.o.   MRN: [id number] Chief Complaint  Patient presents with  . Follow-up    HPI  [name] is a 84 y.o. male The patient returns to the office for followup and review of the noninvasive studies. There have been no interval changes in lower extremity symptoms. No interval shortening of the patient's claudication distance or development of rest pain symptoms. No new ulcers or wounds have occurred since the last visit.  Patient's current ulcerations have completely healed except for a small venous ulceration left overall it is right lower extremity.  There have been no significant changes to the patient's overall health care.  The patient denies amaurosis fugax or recent TIA symptoms. There are no recent neurological changes noted. The patient denies history of DVT, PE or superficial thrombophlebitis. The patient denies recent episodes of angina or shortness of breath.   ABI Rt=1.04 and Lt=1.15 there are no previous ABIs for comparison.  The right lower extremity has triphasic waveforms in the anterior tibial artery with biphasic in the posterior tibial artery.  The left lower extremity has triphasic tibial artery waveforms.   Past Medical History:  Diagnosis Date  . Cancer ([place])   . Chronic kidney disease   . Colon polyp   . Diabetes mellitus without complication ([place])   . Heart murmur 2008  . Hyperlipidemia   . Hypertension 1980  . Retinal detachment     Past Surgical History:  Procedure Laterality Date  . COLONOSCOPY  2011,06/23/13   Dr Bary Castilla  . COLONOSCOPY  06-28-2011  . COLONOSCOPY WITH PROPOFOL N/A 08/30/2015   Procedure: COLONOSCOPY WITH PROPOFOL;  Surgeon: [name], MD;  Location: [facility] ENDOSCOPY;  Service: Endoscopy;  Laterality: N/A;  . EYE SURGERY    . HERNIA REPAIR    . LOWER EXTREMITY ANGIOGRAPHY Right 04/20/2018   Procedure: Lower Extremity Angiography;  Surgeon: Algernon Huxley, MD;   Location: [facility];  Service: Cardiovascular;  Laterality: Right;  . POLYPECTOMY  06-23-13  . ]      Social History   Socioeconomic History  . Marital status: Widowed    Spouse name: Not on file  . Number of children: Not on file  . Years of education: Not on file  . Highest education level: Not on file  Occupational History  . Not on file  Social Needs  . Financial resource strain: Not on file  . Food insecurity:    Worry: Not on file    Inability: Not on file  . Transportation needs:    Medical: Not on file    Non-medical: Not on file  Tobacco Use  . Smoking status: Never Smoker  . Smokeless tobacco: Never Used  Substance and Sexual Activity  . Alcohol use: No  . Drug use: No  . Sexual activity: Not on file  Lifestyle  . Physical activity:    Days per week: Not on file    Minutes per session: Not on file  . Stress: Not on file  Relationships  . Social connections:    Talks on phone: Not on file    Gets together: Not on file    Attends religious service: Not on file    Active member of club or organization: Not on file    Attends meetings of clubs or organizations: Not on file    Relationship status: Not on file  . Intimate partner  violence:    Fear of current or ex partner: Not on file    Emotionally abused: Not on file    Physically abused: Not on file    Forced sexual activity: Not on file  Other Topics Concern  . Not on file  Social History Narrative  . Not on file    No family history on file.  Allergies  Allergen Reactions  . Meperidine Other (See Comments)    Severe bradycardia Severe bradycardia   . Penicillins Rash     Review of Systems   Review of Systems: Negative Unless Checked Constitutional: [] Weight loss  [] Fever  [] Chills Cardiac: [] Chest pain   []  Atrial Fibrillation  [] Palpitations   [] Shortness of breath when laying flat   [] Shortness of breath with exertion. [] Shortness of breath at rest Vascular:  [] Pain in legs  with walking   [] Pain in legs with standing [] Pain in legs when laying flat   [] Claudication    [] Pain in feet when laying flat    [] History of DVT   [] Phlebitis   [] Swelling in legs   [x] Varicose veins   [x] Non-healing ulcers Pulmonary:   [] Uses home oxygen   [] Productive cough   [] Hemoptysis   [] Wheeze  [] COPD   [] Asthma Neurologic:  [] Dizziness   [] Seizures  [] Blackouts [] History of stroke   [] History of TIA  [] Aphasia   [] Temporary Blindness   [] Weakness or numbness in arm   [x] Weakness or numbness in leg Musculoskeletal:   [] Joint swelling   [] Joint pain   [] Low back pain  []  History of Knee Replacement [] Arthritis [] back Surgeries  []  Spinal Stenosis    Hematologic:  [] Easy bruising  [] Easy bleeding   [] Hypercoagulable state   [] Anemic Gastrointestinal:  [] Diarrhea   [] Vomiting  [] Gastroesophageal reflux/heartburn   [] Difficulty swallowing. [] Abdominal pain Genitourinary:  [] Chronic kidney disease   [] Difficult urination  [] Anuric   [] Blood in urine [] Frequent urination  [] Burning with urination   [] Hematuria Skin:  [] Rashes   [x] Ulcers [] Wounds Psychological:  [] History of anxiety   []  History of major depression  []  Memory Difficulties     Objective:   Physical Exam  BP (!) 142/61 (BP Location: Right Arm, Patient Position: Sitting)   Pulse 74   Resp 14   Ht 5\' 10"  (1.778 m)   Wt 150 lb (68 kg)   BMI 21.52 kg/m   Gen: WD/WN, NAD Head: [place]/AT, No temporalis wasting.  Ear/Nose/Throat: Hearing grossly intact, nares w/o erythema or drainage Eyes: PER, EOMI, sclera nonicteric.  Neck: Supple, no masses.  No JVD.  Pulmonary:  Good air movement, no use of accessory muscles.  Cardiac: RRR Vascular: 3+ pedal edema, 2+ leg edema  Vessel Right Left  Radial Palpable Palpable  Dorsalis Pedis  none palpable Non Palpable  Posterior Tibial  none palpable Non Palpable   Gastrointestinal: soft, non-distended. No guarding/no peritoneal signs.  Musculoskeletal: M/S 5/5 throughout.  No deformity  or atrophy.  Neurologic: Pain and light touch intact in extremities.  Symmetrical.  Speech is fluent. Motor exam as listed above. Psychiatric: Judgment intact, Mood & affect appropriate for pt's clinical situation. Dermatologic: No Venous rashes. No Ulcers Noted.  No changes consistent with cellulitis. Lymph : No Cervical lymphadenopathy, no lichenification or skin changes of chronic lymphedema.      Assessment & Plan:   1. Venous stasis ulcer of right calf limited to breakdown of skin without varicose veins (HCC) Patient has very small venous ulceration on the medial aspect of his right lower  extremity.  His left lower extremity is without any ulceration at this time.  We will place the patient in Rule wraps today, he has a home health nurse that comes in and does his own wraps for him.  We will also wrap his left lower extremity due to excessive edema, to prevent any further ulceration.  2. Peripheral arterial disease ([place]) Recommend:  The patient is status post successful angiogram with intervention.  The patient reports that the claudication symptoms and leg pain is essentially gone.   The patient denies lifestyle limiting changes at this point in time.  No further invasive studies, angiography or surgery at this time The patient should continue walking and begin a more formal exercise program.  The patient should continue antiplatelet therapy and aggressive treatment of the lipid abnormalities  Patient should undergo noninvasive studies as ordered. The patient will follow up with me after the studies.   - VAS [place] ABI WITH/WO TBI; Future  3. Essential hypertension Continue antihypertensive medications as already ordered, these medications have been reviewed and there are no changes at this time.    Current Outpatient Medications on File Prior to Visit  Medication Sig Dispense Refill  . amLODipine (NORVASC) 10 MG tablet Take 10 mg by mouth daily.     Marland Kitchen aspirin EC 81 MG EC  tablet Take 1 tablet (81 mg total) by mouth daily. 30 tablet 0  . atorvastatin (LIPITOR) 10 MG tablet Take 1 tablet (10 mg total) by mouth daily at 6 PM. 30 tablet 0  . cloNIDine (CATAPRES - DOSED IN MG/24 HR) 0.2 mg/24hr patch Place 0.2 mg onto the skin every Sunday.     . clopidogrel (PLAVIX) 75 MG tablet Take 1 tablet (75 mg total) by mouth daily. 300 tablet 0  . Ferrous Sulfate (IRON) 28 MG TABS Take 1 tablet by mouth.    . furosemide (LASIX) 40 MG tablet Take 40 mg by mouth daily. Taking 40mg  each am and 20mg  each evening.    Marland Kitchen GLIPIZIDE XL 2.5 MG 24 hr tablet Take 2.5 mg by mouth daily.     Marland Kitchen JALYN 0.5-0.4 MG CAPS Take 1 capsule by mouth daily.     Marland Kitchen labetalol (NORMODYNE) 200 MG tablet Take 100 mg by mouth daily.    Marland Kitchen linagliptin (TRADJENTA) 5 MG TABS tablet Take 5 mg by mouth daily.    Marland Kitchen loperamide (IMODIUM) 2 MG capsule Take 2 mg by mouth as needed for diarrhea or loose stools.    . Multiple Vitamin (MULTIVITAMIN) capsule Take 1 capsule by mouth daily.     No current facility-administered medications on file prior to visit.     There are no Patient Instructions on file for this visit. No follow-ups on file.   Kris Hartmann, NP  This note was completed with Sales executive.  Any errors are purely unintentional.

## 2018-06-02 ENCOUNTER — Encounter: Payer: Medicare Other | Admitting: Physician Assistant

## 2018-06-02 DIAGNOSIS — E11622 Type 2 diabetes mellitus with other skin ulcer: Secondary | ICD-10-CM | POA: Diagnosis not present

## 2018-06-03 NOTE — Progress Notes (Signed)
FILIPPO, PULS ([id number]) Visit Report for 06/02/2018 Chief Complaint Document Details Patient Name: [name], [name] Date of Service: 06/02/2018 12:00 PM Medical Record Number: [id number] Patient Account Number: [id number] Date of Birth/Sex: [birth year] (84 y.o. M) Treating RN: Montey Hora Primary Care Provider: Delight Stare Other Clinician: Referring Provider: Delight Stare Treating Provider/Extender: Melburn Hake, Clothilde Tippetts Weeks in Treatment: 2 Information Obtained from: Patient Chief Complaint Right LE Ulcer Electronic Signature(s) Signed: 06/02/2018 1:28:21 PM By: Worthy Keeler PA-C Entered By: Worthy Keeler on 06/02/2018 12:13:49 Reaney, [name] ([id number]) -------------------------------------------------------------------------------- HPI Details Patient Name: [name] Date of Service: 06/02/2018 12:00 PM Medical Record Number: [id number] Patient Account Number: [id number] Date of Birth/Sex: 1931-08-15 (84 y.o. M) Treating RN: Montey Hora Primary Care Provider: Delight Stare Other Clinician: Referring Provider: Delight Stare Treating Provider/Extender: Melburn Hake, Mark Hassey Weeks in Treatment: 2 History of Present Illness HPI Description: 05/19/18 on evaluation today patient presents for initial evaluation and clinic concerning issues that he has been having with his right medial lower leg ulcer. The patient has actually been seen in the emergency department for cellulitis he was admitted at [facility] on 04/19/18 discharged 04/21/18. He was noted to have proof of vascular disease and did undergo an angiogram on 04/20/18 by Dr. Leotis Pain. 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 Subsequently Dr. dew following intervention noted that the patient had depending on the location 20-40% residual stenosis noted and significant improvement in the overall vascular flow. Obviously  this is great news. With that being said the patient's wound he states does seem to be doing some better compared to previous. Upon inspection indeed it appears that he has some epithelialization. Overall I do not see any evidence of significant infection which is great news. No fevers chills noted 05/26/18 on evaluation today patient's wound actually appears to be doing much better at this time. With that being said I do think the [facility] Dressing a be sticking according to my nurse and this may be causing some issues as well. I'm gonna suggest at this point that we likely add mepitel underneath the [facility] Dressing help prevent this. Other than that everything seems to be doing excellent. 06/02/18 on evaluation today patient actually appears to be doing decently well in regard to his right lower extremity ulcer. This seems to show signs of good improvement which is excellent news. With that being said he did see vascular the good news is no further intervention is recommended nor necessary at this point. Actually placed in an [employer] and sent orders to home health which in the end it led to her switching the patient from the three layer compression wrap of the right lower extremity to an [employer] wrap. I feel like he did better in the three layer compression. With that being said the patient currently shows no signs of worsening the mepitel did help prevent any sticking to the wound bed which is great news. Electronic Signature(s) Signed: 06/02/2018 1:28:21 PM By: Worthy Keeler PA-C Entered By: Worthy Keeler on 06/02/2018 13:20:47 [name], [name] ([id number]) -------------------------------------------------------------------------------- Physical Exam Details Patient Name: [name] Date of Service: 06/02/2018 12:00 PM Medical Record Number: [id number] Patient Account Number: [id number] Date of Birth/Sex: [birth year] (84 y.o. M) Treating RN: Montey Hora Primary Care  Provider: Delight Stare Other Clinician: Referring Provider: Delight Stare Treating Provider/Extender: Melburn Hake,  Carmyn Hamm Weeks in Treatment: 2 Constitutional Well-nourished and well-hydrated in no acute distress. Respiratory normal breathing without difficulty. clear to auscultation bilaterally. Cardiovascular regular rate and rhythm with normal S1, S2. Psychiatric this patient is able to make decisions and demonstrates good insight into disease process. Alert and Oriented x 3. pleasant and cooperative. Notes Patient's wound bed show signs of excellent improvement in fact is almost completely close in regard to the right lower extremity. I do think that the patient would benefit from compression stockings for both lower extremities right now he can use it on the left I do not feel like this needs to be wrapped. On the right I do feel like the patient would benefit from having continue therapy with the three layer compression wrap for the time being. There in agreement with continuing with this. Electronic Signature(s) Signed: 06/02/2018 1:28:21 PM By: Worthy Keeler PA-C Entered By: Worthy Keeler on 06/02/2018 13:21:26 [name], [name] ([id number]) -------------------------------------------------------------------------------- Physician Orders Details Patient Name: [name] Date of Service: 06/02/2018 12:00 PM Medical Record Number: [id number] Patient Account Number: [id number] Date of Birth/Sex: 1931/12/08 (84 y.o. M) Treating RN: Montey Hora Primary Care Provider: Delight Stare Other Clinician: Referring Provider: Delight Stare Treating Provider/Extender: Melburn Hake, Keagon Glascoe Weeks in Treatment: 2 Verbal / Phone Orders: No Diagnosis Coding ICD-10 Coding Code Description E11.622 Type 2 diabetes mellitus with other skin ulcer L97.812 Non-pressure chronic ulcer of other part of right lower leg with fat layer exposed I73.89 Other specified peripheral vascular diseases I10 Essential  (primary) hypertension N18.4 Chronic kidney disease, stage 4 (severe) Wound Cleansing Wound #1 Right,Medial Lower Leg o Clean wound with Normal Saline. o Cleanse wound with mild soap and water Primary Wound Dressing Wound #1 Right,Medial Lower Leg o Mepitel One Contact layer - or adaptic/oil emulsion contact layer o Hydrafera Blue Ready Transfer Secondary Dressing Wound #1 Right,Medial Lower Leg o ABD pad Dressing Change Frequency Wound #1 Right,Medial Lower Leg o Other: - Twice weekly - patient is returning in 2 weeks so HHRN to change wraps until patient returns to clinic Follow-up Appointments Wound #1 Right,Medial Lower Leg o Return Appointment in 2 weeks. - patient is returning in 2 weeks so HHRN to change wraps until patient returns to clinic Edema Control Wound #1 Right,Medial Lower Leg o 3 Layer Compression System - Right Lower Extremity - LEG IS TO BE WRAPPED FROM THE TOES TO 3CM BELOW THE KNEE o Elevate legs to the level of the heart and pump ankles as often as possible Home Health Wound #1 Right,Medial Lower Leg o [place] Visits - patient is returning in 2 weeks so HHRN to change wraps until patient returns to clinic [name], [name] ([id number]) o Trail Nurse may visit PRN to address patientos wound care needs. o FACE TO FACE ENCOUNTER: MEDICARE and MEDICAID PATIENTS: I certify that this patient is under my care and that I had a face-to-face encounter that meets the physician face-to-face encounter requirements with this patient on this date. The encounter with the patient was in whole or in part for the following MEDICAL CONDITION: (primary reason for [place]) MEDICAL NECESSITY: I certify, that based on my findings, NURSING services are a medically necessary home health service. HOME BOUND STATUS: I certify that my clinical findings support that this patient is homebound (i.e., Due to illness or injury, pt requires aid  of supportive devices such as crutches, cane, wheelchairs, walkers, the use of special transportation or the assistance of another  person to leave their place of residence. There is a normal inability to leave the home and doing so requires considerable and taxing effort. Other absences are for medical reasons / religious services and are infrequent or of short duration when for other reasons). o If current dressing causes regression in wound condition, may D/C ordered dressing product/s and apply Normal Saline Moist Dressing daily until next [place] / Other MD appointment. [place] of regression in wound condition at [phone]. o Please direct any NON-WOUND related issues/requests for orders to patient's Primary Care Physician Electronic Signature(s) Signed: 06/02/2018 1:28:21 PM By: Worthy Keeler PA-C Signed: 06/02/2018 1:47:23 PM By: Montey Hora Entered By: Montey Hora on 06/02/2018 13:12:13 [name], [name] ([id number]) -------------------------------------------------------------------------------- Problem List Details Patient Name: [name] Date of Service: 06/02/2018 12:00 PM Medical Record Number: [id number] Patient Account Number: [id number] Date of Birth/Sex: [birth year] (84 y.o. M) Treating RN: Montey Hora Primary Care Provider: Delight Stare Other Clinician: Referring Provider: Delight Stare Treating Provider/Extender: Melburn Hake, Marleny Faller Weeks in Treatment: 2 Active Problems ICD-10 Evaluated Encounter Code Description Active Date Today Diagnosis E11.622 Type 2 diabetes mellitus with other skin ulcer 05/19/2018 No Yes L97.812 Non-pressure chronic ulcer of other part of right lower leg 05/19/2018 No Yes with fat layer exposed I73.89 Other specified peripheral vascular diseases 05/19/2018 No Yes I10 Essential (primary) hypertension 05/19/2018 No Yes N18.4 Chronic kidney disease, stage 4 (severe) 05/19/2018 No Yes Inactive  Problems Resolved Problems Electronic Signature(s) Signed: 06/02/2018 1:28:21 PM By: Worthy Keeler PA-C Entered By: Worthy Keeler on 06/02/2018 12:13:45 Laramee, Cutler RMarland Kitchen ([id number]) -------------------------------------------------------------------------------- Progress Note Details Patient Name: [name] Date of Service: 06/02/2018 12:00 PM Medical Record Number: [id number] Patient Account Number: [id number] Date of Birth/Sex: Nov 14, 1931 (84 y.o. M) Treating RN: Montey Hora Primary Care Provider: Delight Stare Other Clinician: Referring Provider: Delight Stare Treating Provider/Extender: Melburn Hake, Nyrie Sigal Weeks in Treatment: 2 Subjective Chief Complaint Information obtained from Patient Right LE Ulcer History of Present Illness (HPI) 05/19/18 on evaluation today patient presents for initial evaluation and clinic concerning issues that he has been having with his right medial lower leg ulcer. The patient has actually been seen in the emergency department for cellulitis he was admitted at [facility] on 04/19/18 discharged 04/21/18. He was noted to have proof of vascular disease and did undergo an angiogram on 04/20/18 by Dr. Leotis Pain. 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 Subsequently Dr. dew following intervention noted that the patient had depending on the location 20-40% residual stenosis noted and significant improvement in the overall vascular flow. Obviously this is great news. With that being said the patient's wound he states does seem to be doing some better compared to previous. Upon inspection indeed it appears that he has some epithelialization. Overall I do not see any evidence of significant infection which is great news. No fevers chills noted 05/26/18 on evaluation today patient's wound actually appears to be doing much better at this time. With that  being said I do think the [facility] Dressing a be sticking according to my nurse and this may be causing some issues as well. I'm gonna suggest at this point that we likely add mepitel underneath the [facility] Dressing help prevent this. Other than that everything seems to be doing excellent. 06/02/18 on evaluation today patient actually appears to be doing decently  well in regard to his right lower extremity ulcer. This seems to show signs of good improvement which is excellent news. With that being said he did see vascular the good news is no further intervention is recommended nor necessary at this point. Actually placed in an [employer] and sent orders to home health which in the end it led to her switching the patient from the three layer compression wrap of the right lower extremity to an [employer] wrap. I feel like he did better in the three layer compression. With that being said the patient currently shows no signs of worsening the mepitel did help prevent any sticking to the wound bed which is great news. Patient History Information obtained from Patient. Family History Cancer - Mother, Diabetes - Siblings, Hypertension - Siblings, Stroke - Siblings, No family history of Heart Disease, Hereditary Spherocytosis, Kidney Disease, Lung Disease, Seizures, Thyroid Problems, Tuberculosis. Social History Never smoker, Alcohol Use - Never, Drug Use - No History, Caffeine Use - Rarely. Medical And Surgical History Notes Eyes Detached Retina 1980 Review of Systems (ROS) Constitutional Symptoms (General Health) Denies complaints or symptoms of Fever, Chills. MUHSIN, DORIS ([id number]) Respiratory The patient has no complaints or symptoms. Cardiovascular Complains or has symptoms of LE edema. Psychiatric The patient has no complaints or symptoms. Objective Constitutional Well-nourished and well-hydrated in no acute distress. Vitals Time Taken: 12:38 PM, Height: 68 in, Weight:  150 lbs, BMI: 22.8, Temperature: 97.9 F, Pulse: 63 bpm, Respiratory Rate: 16 breaths/min, Blood Pressure: 139/57 mmHg. Respiratory normal breathing without difficulty. clear to auscultation bilaterally. Cardiovascular regular rate and rhythm with normal S1, S2. Psychiatric this patient is able to make decisions and demonstrates good insight into disease process. Alert and Oriented x 3. pleasant and cooperative. General Notes: Patient's wound bed show signs of excellent improvement in fact is almost completely close in regard to the right lower extremity. I do think that the patient would benefit from compression stockings for both lower extremities right now he can use it on the left I do not feel like this needs to be wrapped. On the right I do feel like the patient would benefit from having continue therapy with the three layer compression wrap for the time being. There in agreement with continuing with this. Integumentary (Hair, Skin) Wound #1 status is Open. Original cause of wound was Blister. The wound is located on the Right,Medial Lower Leg. The wound measures 2.5cm length x 1cm width x 0.1cm depth; 1.963cm^2 area and 0.196cm^3 volume. There is Fat Layer (Subcutaneous Tissue) Exposed exposed. There is no tunneling or undermining noted. There is a medium amount of serosanguineous drainage noted. The wound margin is flat and intact. There is large (67-100%) red granulation within the wound bed. There is a small (1-33%) amount of necrotic tissue within the wound bed including Adherent Slough. The periwound skin appearance exhibited: Scarring, Dry/Scaly, Hemosiderin Staining. The periwound skin appearance did not exhibit: Callus, Crepitus, Excoriation, Induration, Rash, Maceration, Atrophie Blanche, Cyanosis, Ecchymosis, Mottled, Pallor, Rubor, Erythema. Periwound temperature was noted as No Abnormality. The periwound has tenderness on palpation. Assessment Active Problems [name], [name]  R. ([id number]) ICD-10 Type 2 diabetes mellitus with other skin ulcer Non-pressure chronic ulcer of other part of right lower leg with fat layer exposed Other specified peripheral vascular diseases Essential (primary) hypertension Chronic kidney disease, stage 4 (severe) Plan Wound Cleansing: Wound #1 Right,Medial Lower Leg: Clean wound with Normal Saline. Cleanse wound with mild soap and water Primary Wound Dressing: Wound #  1 Right,Medial Lower Leg: Mepitel One Contact layer - or adaptic/oil emulsion contact layer Hydrafera Blue Ready Transfer Secondary Dressing: Wound #1 Right,Medial Lower Leg: ABD pad Dressing Change Frequency: Wound #1 Right,Medial Lower Leg: Other: - Twice weekly - patient is returning in 2 weeks so HHRN to change wraps until patient returns to clinic Follow-up Appointments: Wound #1 Right,Medial Lower Leg: Return Appointment in 2 weeks. - patient is returning in 2 weeks so HHRN to change wraps until patient returns to clinic Edema Control: Wound #1 Right,Medial Lower Leg: 3 Layer Compression System - Right Lower Extremity - LEG IS TO BE WRAPPED FROM THE TOES TO 3CM BELOW THE KNEE Elevate legs to the level of the heart and pump ankles as often as possible Home Health: Wound #1 Right,Medial Lower Leg: Continue Home Health Visits - patient is returning in 2 weeks so HHRN to change wraps until patient returns to clinic Home Health Nurse may visit PRN to address patient s wound care needs. FACE TO FACE ENCOUNTER: MEDICARE and MEDICAID PATIENTS: I certify that this patient is under my care and that I had a face-to-face encounter that meets the physician face-to-face encounter requirements with this patient on this date. The encounter with the patient was in whole or in part for the following MEDICAL CONDITION: (primary reason for [place]) MEDICAL NECESSITY: I certify, that based on my findings, NURSING services are a medically necessary home health  service. HOME BOUND STATUS: I certify that my clinical findings support that this patient is homebound (i.e., Due to illness or injury, pt requires aid of supportive devices such as crutches, cane, wheelchairs, walkers, the use of special transportation or the assistance of another person to leave their place of residence. There is a normal inability to leave the home and doing so requires considerable and taxing effort. Other absences are for medical reasons / religious services and are infrequent or of short duration when for other reasons). If current dressing causes regression in wound condition, may D/C ordered dressing product/s and apply Normal Saline Moist Dressing daily until next [place] / Other MD appointment. [place] of regression in wound condition at [phone]. Please direct any NON-WOUND related issues/requests for orders to patient's Primary Care Physician My suggestion currently is gonna be that we continue with the above wound care measures. The patient is in agreement the plan. We will subsequently see were things stand at follow-up. [name], [name] ([id number]) Please see above for specific wound care orders. We will see patient for re-evaluation in 2 week(s) here in the clinic. If anything worsens or changes patient will contact our office for additional recommendations. Electronic Signature(s) Signed: 06/02/2018 1:28:21 PM By: Worthy Keeler PA-C Entered By: Worthy Keeler on 06/02/2018 13:21:48 [name], [name] ([id number]) -------------------------------------------------------------------------------- ROS/PFSH Details Patient Name: [name] Date of Service: 06/02/2018 12:00 PM Medical Record Number: [id number] Patient Account Number: [id number] Date of Birth/Sex: 06-Jun-1932 (84 y.o. M) Treating RN: Montey Hora Primary Care Provider: Delight Stare Other Clinician: Referring Provider: Delight Stare Treating Provider/Extender:  STONE III, Yordin Rhoda Weeks in Treatment: 2 Information Obtained From Patient Wound History Do you currently have one or more open woundso Yes How many open wounds do you currently haveo 1 Approximately how long have you had your woundso 3 months How have you been treating your wound(s) until nowo bandage, Home Health treating Has your wound(s) ever healed and then re-openedo No Have you had any lab work done in the past  montho Yes Who ordered the lab work [place] Have you tested positive for an antibiotic resistant organism (MRSA, VRE)o No Have you tested positive for osteomyelitis (bone infection)o No Have you had any tests for circulation on your legso Yes Where was the test doneo  [facility] Have you had other problems associated with your woundso Infection Constitutional Symptoms (General Health) Complaints and Symptoms: Negative for: Fever; Chills Cardiovascular Complaints and Symptoms: Positive for: LE edema Medical History: Positive for: Hypertension - takes medication; Peripheral Venous Disease Eyes Medical History: Positive for: Cataracts Negative for: Glaucoma; Optic Neuritis Past Medical History Notes: Detached Retina 1980 Hematologic/Lymphatic Medical History: Negative for: Anemia; Hemophilia; Human Immunodeficiency Virus; Lymphedema; Sickle Cell Disease Respiratory Complaints and Symptoms: No Complaints or Symptoms Medical HistoryLLEWYN, HEAP. ([id number]) Negative for: Aspiration; Asthma; Chronic Obstructive Pulmonary Disease (COPD); Pneumothorax; Sleep Apnea; Tuberculosis Gastrointestinal Medical History: Negative for: Cirrhosis ; Colitis; Crohnos; Hepatitis A; Hepatitis B; Hepatitis C Endocrine Medical History: Positive for: Type II Diabetes - 15 years Time with diabetes: 15 years Treated with: Oral agents Blood sugar tested every day: Yes Tested : 205 Genitourinary Medical History: Positive for: End Stage  Renal Disease - Stage 4, no dialysis, loses protein Immunological Medical History: Negative for: Lupus Erythematosus; Raynaudos; Scleroderma Integumentary (Skin) Medical History: Negative for: History of Burn; History of pressure wounds Musculoskeletal Medical History: Negative for: Gout; Rheumatoid Arthritis; Osteoarthritis; Osteomyelitis Neurologic Medical History: Negative for: Dementia; Neuropathy; Quadriplegia; Paraplegia; Seizure Disorder Oncologic Medical History: Negative for: Received Chemotherapy; Received Radiation Psychiatric Complaints and Symptoms: No Complaints or Symptoms Medical History: Negative for: Anorexia/bulimia; Confinement Anxiety HBO Extended History Items Eyes: Cataracts [name], [name] R. ([id number]) Immunizations Pneumococcal Vaccine: Received Pneumococcal Vaccination: Yes Implantable Devices Family and Social History Cancer: Yes - Mother; Diabetes: Yes - Siblings; Heart Disease: No; Hereditary Spherocytosis: No; Hypertension: Yes - Siblings; Kidney Disease: No; Lung Disease: No; Seizures: No; Stroke: Yes - Siblings; Thyroid Problems: No; Tuberculosis: No; Never smoker; Alcohol Use: Never; Drug Use: No History; Caffeine Use: Rarely; Financial Concerns: No; Food, Clothing or Shelter Needs: No; Support System Lacking: No; Transportation Concerns: No; Advanced Directives: Yes (Not Provided); Patient does not want information on Advanced Directives; Living Will: Yes (Not Provided) Physician Affirmation I have reviewed and agree with the above information. Electronic Signature(s) Signed: 06/02/2018 1:28:21 PM By: Worthy Keeler PA-C Signed: 06/02/2018 1:47:23 PM By: Montey Hora Entered By: Worthy Keeler on 06/02/2018 13:21:00 [name], [name] ([id number]) -------------------------------------------------------------------------------- SuperBill Details Patient Name: [name] Date of Service: 06/02/2018 Medical Record Number:  [id number] Patient Account Number: [id number] Date of Birth/Sex: 08-19-1931 (84 y.o. M) Treating RN: Montey Hora Primary Care Provider: Delight Stare Other Clinician: Referring Provider: Delight Stare Treating Provider/Extender: Melburn Hake, Gibson Lad Weeks in Treatment: 2 Diagnosis Coding ICD-10 Codes Code Description E11.622 Type 2 diabetes mellitus with other skin ulcer L97.812 Non-pressure chronic ulcer of other part of right lower leg with fat layer exposed I73.89 Other specified peripheral vascular diseases I10 Essential (primary) hypertension N18.4 Chronic kidney disease, stage 4 (severe) Facility Procedures CPT4 Code Description: [id number] (Facility Use Only) [id number] - APPLY MULTLAY COMPRS LWR RT LEG Modifier: Quantity: 1 Physician Procedures CPT4 Code Description: 5573220 [id number] - WC PHYS LEVEL 4 - EST PT ICD-10 Diagnosis Description E11.622 Type 2 diabetes mellitus with other skin ulcer 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

## 2018-06-03 NOTE — Progress Notes (Signed)
KALED, ALLENDE ([id number]) Visit Report for 06/02/2018 Arrival Information Details Patient Name: [name], [name] Date of Service: 06/02/2018 12:00 PM Medical Record Number: [id number] Patient Account Number: [id number] Date of Birth/Sex: 1931/07/21 (84 y.o. M) Treating RN: Montey Hora Primary Care Taquilla Downum: Delight Stare Other Clinician: Referring Merridith Dershem: Delight Stare Treating Fender Herder/Extender: Melburn Hake, HOYT Weeks in Treatment: 2 Visit Information History Since Last Visit Added or deleted any medications: No Patient Arrived: Ambulatory Any new allergies or adverse reactions: No Arrival Time: 12:34 Had a fall or experienced change in No Accompanied By: daughter activities of daily living that may affect Transfer Assistance: None risk of falls: Patient Identification Verified: Yes Signs or symptoms of abuse/neglect since last visito No Secondary Verification Process Completed: Yes Hospitalized since last visit: No Implantable device outside of the clinic excluding No cellular tissue based products placed in the center since last visit: Has Dressing in Place as Prescribed: Yes Pain Present Now: No Electronic Signature(s) Signed: 06/02/2018 1:35:12 PM By: Lorine Bears RCP, RRT, CHT Entered By: Lorine Bears on 06/02/2018 12:37:37 Melland, [name] ([id number]) -------------------------------------------------------------------------------- Compression Therapy Details Patient Name: [name] Date of Service: 06/02/2018 12:00 PM Medical Record Number: [id number] Patient Account Number: [id number] Date of Birth/Sex: [birth year] (84 y.o. M) Treating RN: Montey Hora Primary Care Lilymae Swiech: Delight Stare Other Clinician: Referring Amazing Cowman: Delight Stare Treating Jemiah Ellenburg/Extender: STONE III, HOYT Weeks in Treatment: 2 Compression Therapy Performed for Wound Assessment: Wound #1 Right,Medial Lower Leg Performed By: Clinician Montey Hora,  RN Compression Type: Three Layer Pre Treatment ABI: 0.9 Post Procedure Diagnosis Same as Pre-procedure Electronic Signature(s) Signed: 06/02/2018 1:30:43 PM By: Montey Hora Entered By: Montey Hora on 06/02/2018 13:30:43 Slatten, [name] ([id number]) -------------------------------------------------------------------------------- Encounter Discharge Information Details Patient Name: [name] Date of Service: 06/02/2018 12:00 PM Medical Record Number: [id number] Patient Account Number: [id number] Date of Birth/Sex: 1931-12-03 (84 y.o. M) Treating RN: Montey Hora Primary Care Alys Dulak: Delight Stare Other Clinician: Referring Sunni Richardson: Delight Stare Treating Lovenia Debruler/Extender: Melburn Hake, HOYT Weeks in Treatment: 2 Encounter Discharge Information Items Discharge Condition: Stable Ambulatory Status: Ambulatory Discharge Destination: Home Transportation: Private Auto Accompanied By: dtr Schedule Follow-up Appointment: Yes Clinical Summary of Care: Electronic Signature(s) Signed: 06/02/2018 1:32:58 PM By: Montey Hora Entered By: Montey Hora on 06/02/2018 13:32:57 [name], [name] ([id number]) -------------------------------------------------------------------------------- Lower Extremity Assessment Details Patient Name: [name] Date of Service: 06/02/2018 12:00 PM Medical Record Number: [id number] Patient Account Number: [id number] Date of Birth/Sex: 12/11/1931 (84 y.o. M) Treating RN: Harold Barban Primary Care Airianna Kreischer: Delight Stare Other Clinician: Referring Marthena Whitmyer: Delight Stare Treating Jaylon Grode/Extender: Melburn Hake, HOYT Weeks in Treatment: 2 Electronic Signature(s) Signed: 06/02/2018 1:37:43 PM By: Harold Barban Entered By: Harold Barban on 06/02/2018 12:59:16 [name], [name] ([id number]) -------------------------------------------------------------------------------- Multi Wound Chart Details Patient Name: [name] Date of Service: 06/02/2018  12:00 PM Medical Record Number: [id number] Patient Account Number: [id number] Date of Birth/Sex: Apr 05, 1932 (84 y.o. M) Treating RN: Montey Hora Primary Care Renne Cornick: Delight Stare Other Clinician: Referring Yuvraj Pfeifer: Delight Stare Treating Deniz Eskridge/Extender: STONE III, HOYT Weeks in Treatment: 2 Vital Signs Height(in): 68 Pulse(bpm): 44 Weight(lbs): 150 Blood Pressure(mmHg): 139/57 Body Mass Index(BMI): 23 Temperature(F): 97.9 Respiratory Rate 16 (breaths/min): Photos: [1:No Photos] [N/A:N/A] Wound Location: [1:Right Lower Leg - Medial] [N/A:N/A] Wounding Event: [1:Blister] [N/A:N/A] Primary Etiology: [1:Diabetic Wound/Ulcer of the Lower Extremity] [N/A:N/A] Comorbid History: [1:Cataracts, Hypertension, Peripheral Venous Disease, Type II Diabetes, End Stage Renal Disease] [N/A:N/A] Date Acquired: [1:04/19/2018] [N/A:N/A] Weeks of Treatment: [1:2] [N/A:N/A] Wound Status: [1:Open] [  N/A:N/A] Measurements L x W x D [1:2.5x1x0.1] [N/A:N/A] (cm) Area (cm) : [1:1.963] [N/A:N/A] Volume (cm) : [1:0.196] [N/A:N/A] % Reduction in Area: [1:80.00%] [N/A:N/A] % Reduction in Volume: [1:80.00%] [N/A:N/A] Classification: [1:Grade 2] [N/A:N/A] Exudate Amount: [1:Medium] [N/A:N/A] Exudate Type: [1:Serosanguineous] [N/A:N/A] Exudate Color: [1:red, brown] [N/A:N/A] Wound Margin: [1:Flat and Intact] [N/A:N/A] Granulation Amount: [1:Large (67-100%)] [N/A:N/A] Granulation Quality: [1:Red] [N/A:N/A] Necrotic Amount: [1:Small (1-33%)] [N/A:N/A] Exposed Structures: [1:Fat Layer (Subcutaneous Tissue) Exposed: Yes Fascia: No Tendon: No Muscle: No Joint: No Bone: No] [N/A:N/A] Epithelialization: [1:Small (1-33%)] [N/A:N/A] Periwound Skin Texture: [1:Scarring: Yes Excoriation: No Induration: No] [N/A:N/A] Callus: No Crepitus: No Rash: No Periwound Skin Moisture: Dry/Scaly: Yes N/A N/A Maceration: No Periwound Skin Color: Hemosiderin Staining: Yes N/A N/A Atrophie Blanche: No Cyanosis:  No Ecchymosis: No Erythema: No Mottled: No Pallor: No Rubor: No Temperature: No Abnormality N/A N/A Tenderness on Palpation: Yes N/A N/A Wound Preparation: Ulcer Cleansing: N/A N/A Rinsed/Irrigated with Saline Topical Anesthetic Applied: Other: lidocaine 4% Treatment Notes Electronic Signature(s) Signed: 06/02/2018 1:47:23 PM By: Montey Hora Entered By: Montey Hora on 06/02/2018 13:06:44 [name], [name] ([id number]) -------------------------------------------------------------------------------- Export Details Patient Name: [name] Date of Service: 06/02/2018 12:00 PM Medical Record Number: [id number] Patient Account Number: [id number] Date of Birth/Sex: June 27, 1931 (84 y.o. M) Treating RN: Montey Hora Primary Care Diandre Merica: Delight Stare Other Clinician: Referring Kendrick Remigio: Delight Stare Treating Davine Coba/Extender: Melburn Hake, HOYT Weeks in Treatment: 2 Active Inactive Abuse / Safety / Falls / Self Care Management Nursing Diagnoses: Potential for falls Goals: Patient will not experience any injury related to falls Date Initiated: 05/19/2018 Target Resolution Date: 08/15/2018 Goal Status: Active Interventions: Assess fall risk on admission and as needed Notes: Orientation to the Wound Care Program Nursing Diagnoses: Knowledge deficit related to the wound healing center program Goals: Patient/caregiver will verbalize understanding of the [facility] Date Initiated: 05/19/2018 Target Resolution Date: 08/15/2018 Goal Status: Active Interventions: Provide education on orientation to the wound center Notes: Venous Leg Ulcer Nursing Diagnoses: Potential for venous Insuffiency (use before diagnosis confirmed) Goals: Patient will maintain optimal edema control Date Initiated: 05/19/2018 Target Resolution Date: 08/15/2018 Goal Status: Active Interventions: Compression as ordered [name], [name] R. ([id number]) Notes: Wound/Skin  Impairment Nursing Diagnoses: Impaired tissue integrity Goals: Ulcer/skin breakdown will heal within 14 weeks Date Initiated: 05/19/2018 Target Resolution Date: 08/15/2018 Goal Status: Active Interventions: Assess ulceration(s) every visit Notes: Electronic Signature(s) Signed: 06/02/2018 1:47:23 PM By: Montey Hora Entered By: Montey Hora on 06/02/2018 13:06:34 [name], [name] ([id number]) -------------------------------------------------------------------------------- Pain Assessment Details Patient Name: [name] Date of Service: 06/02/2018 12:00 PM Medical Record Number: [id number] Patient Account Number: [id number] Date of Birth/Sex: [birth year] (84 y.o. M) Treating RN: Montey Hora Primary Care Leonard Hendler: Delight Stare Other Clinician: Referring Loan Oguin: Delight Stare Treating Tykesha Konicki/Extender: STONE III, HOYT Weeks in Treatment: 2 Active Problems Location of Pain Severity and Description of Pain Patient Has Paino No Site Locations Pain Management and Medication Current Pain Management: Electronic Signature(s) Signed: 06/02/2018 1:35:12 PM By: Paulla Fore, RRT, CHT Signed: 06/02/2018 1:47:23 PM By: Montey Hora Entered By: Lorine Bears on 06/02/2018 12:37:46 [name], [name] ([id number]) -------------------------------------------------------------------------------- Patient/Caregiver Education Details Patient Name: [name] Date of Service: 06/02/2018 12:00 PM Medical Record Number: [id number] Patient Account Number: [id number] Date of Birth/Gender: 1932/03/01 (84 y.o. M) Treating RN: Montey Hora Primary Care Physician: Delight Stare Other Clinician: Referring Physician: Delight Stare Treating Physician/Extender: Sharalyn Ink in Treatment: 2 Education Assessment Education Provided To: Patient and Caregiver  Education Topics Provided Venous: Handouts: Other: leg elevation and need for ongoing  compression Methods: Demonstration, Explain/Verbal Responses: State content correctly Electronic Signature(s) Signed: 06/02/2018 1:47:23 PM By: Montey Hora Entered By: Montey Hora on 06/02/2018 13:32:32 [name], [name] ([id number]) -------------------------------------------------------------------------------- Wound Assessment Details Patient Name: [name] Date of Service: 06/02/2018 12:00 PM Medical Record Number: [id number] Patient Account Number: [id number] Date of Birth/Sex: May 07, 1932 (84 y.o. M) Treating RN: Harold Barban Primary Care Eleny Cortez: Delight Stare Other Clinician: Referring Maddisyn Hegwood: Delight Stare Treating Korbin Mapps/Extender: STONE III, HOYT Weeks in Treatment: 2 Wound Status Wound Number: 1 Primary Diabetic Wound/Ulcer of the Lower Extremity Etiology: Wound Location: Right Lower Leg - Medial Wound Open Wounding Event: Blister Status: Date Acquired: 04/19/2018 Comorbid Cataracts, Hypertension, Peripheral Venous Weeks Of Treatment: 2 History: Disease, Type II Diabetes, End Stage Renal Clustered Wound: No Disease Photos Photo Uploaded By: Gretta Cool, BSN, RN, CWS, Kim on 06/02/2018 13:09:16 Wound Measurements Length: (cm) 2.5 Width: (cm) 1 Depth: (cm) 0.1 Area: (cm) 1.963 Volume: (cm) 0.196 % Reduction in Area: 80% % Reduction in Volume: 80% Epithelialization: Small (1-33%) Tunneling: No Undermining: No Wound Description Classification: Grade 2 Foul Odor Wound Margin: Flat and Intact Slough/Fi Exudate Amount: Medium Exudate Type: Serosanguineous Exudate Color: red, brown After Cleansing: No brino Yes Wound Bed Granulation Amount: Large (67-100%) Exposed Structure Granulation Quality: Red Fascia Exposed: No Necrotic Amount: Small (1-33%) Fat Layer (Subcutaneous Tissue) Exposed: Yes Necrotic Quality: Adherent Slough Tendon Exposed: No Muscle Exposed: No Joint Exposed: No Bone Exposed: No Periwound Skin Texture [name], [name] R.  ([id number]) Texture Color No Abnormalities Noted: No No Abnormalities Noted: No Callus: No Atrophie Blanche: No Crepitus: No Cyanosis: No Excoriation: No Ecchymosis: No Induration: No Erythema: No Rash: No Hemosiderin Staining: Yes Scarring: Yes Mottled: No Pallor: No Moisture Rubor: No No Abnormalities Noted: No Dry / Scaly: Yes Temperature / Pain Maceration: No Temperature: No Abnormality Tenderness on Palpation: Yes Wound Preparation Ulcer Cleansing: Rinsed/Irrigated with Saline Topical Anesthetic Applied: Other: lidocaine 4%, Treatment Notes Wound #1 (Right, Medial Lower Leg) Notes mepitel, hydrafera blue, ABD, 3 layer wrap Electronic Signature(s) Signed: 06/02/2018 1:37:43 PM By: Harold Barban Entered By: Harold Barban on 06/02/2018 12:59:04 [name], [name] ([id number]) -------------------------------------------------------------------------------- Vitals Details Patient Name: [name] Date of Service: 06/02/2018 12:00 PM Medical Record Number: [id number] Patient Account Number: [id number] Date of Birth/Sex: 1932-01-16 (84 y.o. M) Treating RN: Montey Hora Primary Care Yliana Gravois: Delight Stare Other Clinician: Referring Geselle Cardosa: Delight Stare Treating Tyaira Heward/Extender: STONE III, HOYT Weeks in Treatment: 2 Vital Signs Time Taken: 12:38 Temperature (F): 97.9 Height (in): 68 Pulse (bpm): 63 Weight (lbs): 150 Respiratory Rate (breaths/min): 16 Body Mass Index (BMI): 22.8 Blood Pressure (mmHg): 139/57 Reference Range: 80 - 120 mg / dl Electronic Signature(s) Signed: 06/02/2018 1:35:12 PM By: Lorine Bears RCP, RRT, CHT Entered By: Lorine Bears on 06/02/2018 12:41:37

## 2018-06-12 ENCOUNTER — Encounter: Payer: Medicare Other | Attending: Physician Assistant | Admitting: Physician Assistant

## 2018-06-12 DIAGNOSIS — L97812 Non-pressure chronic ulcer of other part of right lower leg with fat layer exposed: Secondary | ICD-10-CM | POA: Diagnosis not present

## 2018-06-12 DIAGNOSIS — Z79899 Other long term (current) drug therapy: Secondary | ICD-10-CM | POA: Insufficient documentation

## 2018-06-12 DIAGNOSIS — I7389 Other specified peripheral vascular diseases: Secondary | ICD-10-CM | POA: Insufficient documentation

## 2018-06-12 DIAGNOSIS — I129 Hypertensive chronic kidney disease with stage 1 through stage 4 chronic kidney disease, or unspecified chronic kidney disease: Secondary | ICD-10-CM | POA: Insufficient documentation

## 2018-06-12 DIAGNOSIS — E1122 Type 2 diabetes mellitus with diabetic chronic kidney disease: Secondary | ICD-10-CM | POA: Insufficient documentation

## 2018-06-12 DIAGNOSIS — Z7984 Long term (current) use of oral hypoglycemic drugs: Secondary | ICD-10-CM | POA: Insufficient documentation

## 2018-06-12 DIAGNOSIS — E11622 Type 2 diabetes mellitus with other skin ulcer: Secondary | ICD-10-CM | POA: Diagnosis present

## 2018-06-12 DIAGNOSIS — N184 Chronic kidney disease, stage 4 (severe): Secondary | ICD-10-CM | POA: Insufficient documentation

## 2018-06-16 ENCOUNTER — Encounter: Payer: Medicare Other | Admitting: Physician Assistant

## 2018-06-16 DIAGNOSIS — E11622 Type 2 diabetes mellitus with other skin ulcer: Secondary | ICD-10-CM | POA: Diagnosis not present

## 2018-06-17 NOTE — Progress Notes (Signed)
SPARSH, CALLENS ([id number]) Visit Report for 06/16/2018 Arrival Information Details Patient Name: CULLAN, LAUNER Date of Service: 06/16/2018 12:30 PM Medical Record Number: [id number] Patient Account Number: [id number] Date of Birth/Sex: Jan 17, 1932 (85 y.o. M) Treating RN: Montey Hora Primary Care Kely Dohn: Delight Stare Other Clinician: Referring Caliph Borowiak: Delight Stare Treating Lester Platas/Extender: Melburn Hake, HOYT Weeks in Treatment: 4 Visit Information History Since Last Visit Added or deleted any medications: No Patient Arrived: Ambulatory Any new allergies or adverse reactions: No Arrival Time: 12:40 Had a fall or experienced change in No Accompanied By: daughter activities of daily living that may affect Transfer Assistance: None risk of falls: Patient Identification Verified: Yes Signs or symptoms of abuse/neglect since last visito No Secondary Verification Process Completed: Yes Hospitalized since last visit: No Implantable device outside of the clinic excluding No cellular tissue based products placed in the center since last visit: Has Dressing in Place as Prescribed: Yes Pain Present Now: No Electronic Signature(s) Signed: 06/16/2018 3:06:21 PM By: Lorine Bears RCP, RRT, CHT Entered By: Lorine Bears on 06/16/2018 12:41:20 Darroch, [name] ([id number]) -------------------------------------------------------------------------------- Compression Therapy Details Patient Name: [name] Date of Service: 06/16/2018 12:30 PM Medical Record Number: [id number] Patient Account Number: [id number] Date of Birth/Sex: 08-20-1931 (86 y.o. M) Treating RN: Montey Hora Primary Care [place] Straus: Delight Stare Other Clinician: Referring Suhana Wilner: Delight Stare Treating Lawsyn Heiler/Extender: STONE III, HOYT Weeks in Treatment: 4 Compression Therapy Performed for Wound Assessment: Wound #1 Right,Medial Lower Leg Performed By: Clinician Montey Hora, RN Compression  Type: Three Layer Pre Treatment ABI: 0.9 Post Procedure Diagnosis Same as Pre-procedure Electronic Signature(s) Signed: 06/16/2018 4:56:07 PM By: Montey Hora Entered By: Montey Hora on 06/16/2018 13:57:50 Harbaugh, [name] ([id number]) -------------------------------------------------------------------------------- Encounter Discharge Information Details Patient Name: [name] Date of Service: 06/16/2018 12:30 PM Medical Record Number: [id number] Patient Account Number: [id number] Date of Birth/Sex: 1932/03/04 (85 y.o. M) Treating RN: Montey Hora Primary Care Kaelynn Igo: Delight Stare Other Clinician: Referring Lyden Redner: Delight Stare Treating Berdena Cisek/Extender: Melburn Hake, HOYT Weeks in Treatment: 4 Encounter Discharge Information Items Discharge Condition: Stable Ambulatory Status: Ambulatory Discharge Destination: Home Transportation: Private Auto Accompanied By: daughter Schedule Follow-up Appointment: Yes Clinical Summary of Care: Electronic Signature(s) Signed: 06/16/2018 4:56:07 PM By: Montey Hora Entered By: Montey Hora on 06/16/2018 13:57:07 Kathman, [name] ([id number]) -------------------------------------------------------------------------------- Lower Extremity Assessment Details Patient Name: [name] Date of Service: 06/16/2018 12:30 PM Medical Record Number: [id number] Patient Account Number: [id number] Date of Birth/Sex: 08-19-1931 (86 y.o. M) Treating RN: Secundino Ginger Primary Care Charmaine Placido: Delight Stare Other Clinician: Referring Lior Cartelli: Delight Stare Treating Shraga Custard/Extender: STONE III, HOYT Weeks in Treatment: 4 Edema Assessment Assessed: [Left: No] [Right: No] [Left: Edema] [Right: :] Calf Left: Right: Point of Measurement: 34 cm From Medial Instep cm cm Ankle Left: Right: Point of Measurement: 12 cm From Medial Instep cm cm Vascular Assessment Claudication: Claudication Assessment [Right:None] Pulses: Dorsalis Pedis Palpable:  [Right:Yes] Posterior Tibial Extremity colors, hair growth, and conditions: Extremity Color: [Right:Hyperpigmented] Hair Growth on Extremity: [Right:No] Temperature of Extremity: [Right:Warm] Capillary Refill: [Right:< 3 seconds] Toe Nail Assessment Left: Right: Thick: No Discolored: No Deformed: No Improper Length and Hygiene: No Electronic Signature(s) Signed: 06/16/2018 1:34:38 PM By: Secundino Ginger Entered By: Secundino Ginger on 06/16/2018 13:19:23 Wartman, [name] ([id number]) -------------------------------------------------------------------------------- Multi Wound Chart Details Patient Name: [name] Date of Service: 06/16/2018 12:30 PM Medical Record Number: [id number] Patient Account Number: [id number] Date of Birth/Sex: 1931/07/19 (86 y.o. M) Treating RN: Montey Hora Primary Care Rihanna Marseille: MILES,  LINDA Other Clinician: Referring Namrata Dangler: MILES, LINDA Treating Gordon Vandunk/Extender: STONE III, HOYT Weeks in Treatment: 4 Vital Signs Height(in): 68 Pulse(bpm): 63 Weight(lbs): 150 Blood Pressure(mmHg): 149/58 Body Mass Index(BMI): 23 Temperature(F): 97.6 Respiratory Rate 16 (breaths/min): Photos: [N/A:N/A] Wound Location: Right Lower Leg - Medial N/A N/A Wounding Event: Blister N/A N/A Primary Etiology: Diabetic Wound/Ulcer of the N/A N/A Lower Extremity Comorbid History: Cataracts, Hypertension, N/A N/A Peripheral Venous Disease, Type II Diabetes, End Stage Renal Disease Date Acquired: 04/19/2018 N/A N/A Weeks of Treatment: 4 N/A N/A Wound Status: Open N/A N/A Measurements L x W x D 0.1x0.1x0.1 N/A N/A (cm) Area (cm) : 0.008 N/A N/A Volume (cm) : 0.001 N/A N/A % Reduction in Area: 99.90% N/A N/A % Reduction in Volume: 99.90% N/A N/A Classification: Grade 2 N/A N/A Exudate Amount: None Present N/A N/A Wound Margin: Flat and Intact N/A N/A Granulation Amount: None Present (0%) N/A N/A Necrotic Amount: Small (1-33%) N/A N/A Necrotic Tissue: Eschar N/A  N/A Exposed Structures: Fat Layer (Subcutaneous N/A N/A Tissue) Exposed: Yes Fascia: No Tendon: No Muscle: No Daman, Jian R. ([id number]) Joint: No Bone: No Epithelialization: Small (1-33%) N/A N/A Periwound Skin Texture: Scarring: Yes N/A N/A Excoriation: No Induration: No Callus: No Crepitus: No Rash: No Periwound Skin Moisture: Dry/Scaly: Yes N/A N/A Maceration: No Periwound Skin Color: Hemosiderin Staining: Yes N/A N/A Atrophie Blanche: No Cyanosis: No Ecchymosis: No Erythema: No Mottled: No Pallor: No Rubor: No Temperature: No Abnormality N/A N/A Tenderness on Palpation: Yes N/A N/A Wound Preparation: Ulcer Cleansing: N/A N/A Rinsed/Irrigated with Saline Topical Anesthetic Applied: Other: lidocaine 4% Treatment Notes Electronic Signature(s) Signed: 06/16/2018 4:56:07 PM By: Montey Hora Entered By: Montey Hora on 06/16/2018 13:51:32 JAYREN, CEASE ([id number]) -------------------------------------------------------------------------------- Multi-Disciplinary Care Plan Details Patient Name: [name] Date of Service: 06/16/2018 12:30 PM Medical Record Number: [id number] Patient Account Number: [id number] Date of Birth/Sex: May 20, 1932 (85 y.o. M) Treating RN: Montey Hora Primary Care Sandeep Radell: Delight Stare Other Clinician: Referring Leasa Kincannon: Delight Stare Treating Londynn Sonoda/Extender: Melburn Hake, HOYT Weeks in Treatment: 4 Active Inactive Abuse / Safety / Falls / Self Care Management Nursing Diagnoses: Potential for falls Goals: Patient will not experience any injury related to falls Date Initiated: 05/19/2018 Target Resolution Date: 08/15/2018 Goal Status: Active Interventions: Assess fall risk on admission and as needed Notes: Orientation to the Wound Care Program Nursing Diagnoses: Knowledge deficit related to the wound healing center program Goals: Patient/caregiver will verbalize understanding of the [facility] Date  Initiated: 05/19/2018 Target Resolution Date: 08/15/2018 Goal Status: Active Interventions: Provide education on orientation to the wound center Notes: Venous Leg Ulcer Nursing Diagnoses: Potential for venous Insuffiency (use before diagnosis confirmed) Goals: Patient will maintain optimal edema control Date Initiated: 05/19/2018 Target Resolution Date: 08/15/2018 Goal Status: Active Interventions: Compression as ordered Schuermann, Issaih R. ([id number]) Notes: Wound/Skin Impairment Nursing Diagnoses: Impaired tissue integrity Goals: Ulcer/skin breakdown will heal within 14 weeks Date Initiated: 05/19/2018 Target Resolution Date: 08/15/2018 Goal Status: Active Interventions: Assess ulceration(s) every visit Notes: Electronic Signature(s) Signed: 06/16/2018 4:56:07 PM By: Montey Hora Entered By: Montey Hora on 06/16/2018 13:51:23 Natal, [name] ([id number]) -------------------------------------------------------------------------------- Non-Wound Condition Assessment Details Patient Name: [name] Date of Service: 06/16/2018 12:30 PM Medical Record Number: [id number] Patient Account Number: [id number] Date of Birth/Sex: 04-25-1932 (86 y.o. M) Treating RN: Secundino Ginger Primary Care Karrington Mccravy: Delight Stare Other Clinician: Referring Alethia Melendrez: Delight Stare Treating Jahanna Raether/Extender: STONE III, HOYT Weeks in Treatment: 4 Non-Wound Condition: Condition: Other Dermatologic Condition Location: Leg Side: Right Photos  Periwound Skin Texture Texture Color No Abnormalities Noted: Yes No Abnormalities Noted: No Atrophie Blanche: No Moisture Cyanosis: No No Abnormalities Noted: No Ecchymosis: No Dry / Scaly: No Erythema: No Maceration: No Hemosiderin Staining: No Mottled: No Pallor: No Rubor: No Electronic Signature(s) Signed: 06/16/2018 1:34:38 PM By: Secundino Ginger Entered By: Secundino Ginger on 06/16/2018 13:26:05 Luff, [name]  ([id number]) -------------------------------------------------------------------------------- Pain Assessment Details Patient Name: [name] Date of Service: 06/16/2018 12:30 PM Medical Record Number: [id number] Patient Account Number: [id number] Date of Birth/Sex: 1932-04-06 (86 y.o. M) Treating RN: Montey Hora Primary Care Luqman Perrelli: Delight Stare Other Clinician: Referring Fotini Lemus: Delight Stare Treating Reakwon Barren/Extender: Melburn Hake, HOYT Weeks in Treatment: 4 Active Problems Location of Pain Severity and Description of Pain Patient Has Paino No Site Locations Pain Management and Medication Current Pain Management: Electronic Signature(s) Signed: 06/16/2018 3:06:21 PM By: Paulla Fore, RRT, CHT Signed: 06/16/2018 4:56:07 PM By: Montey Hora Entered By: Lorine Bears on 06/16/2018 12:41:27 ROBEL, WUERTZ ([id number]) -------------------------------------------------------------------------------- Patient/Caregiver Education Details Patient Name: [name] Date of Service: 06/16/2018 12:30 PM Medical Record Number: [id number] Patient Account Number: [id number] Date of Birth/Gender: 28-Jan-1932 (85 y.o. M) Treating RN: Montey Hora Primary Care Physician: Delight Stare Other Clinician: Referring Physician: Delight Stare Treating Physician/Extender: Sharalyn Ink in Treatment: 4 Education Assessment Education Provided To: Patient Education Topics Provided Venous: Handouts: Other: need for compression Methods: Explain/Verbal Responses: State content correctly Electronic Signature(s) Signed: 06/16/2018 4:56:07 PM By: Montey Hora Entered By: Montey Hora on 06/16/2018 13:57:26 Vent, [name] ([id number]) -------------------------------------------------------------------------------- Wound Assessment Details Patient Name: [name] Date of Service: 06/16/2018 12:30 PM Medical Record Number: [id number] Patient Account Number:  [id number] Date of Birth/Sex: 12/18/1931 (86 y.o. M) Treating RN: Secundino Ginger Primary Care Kash Davie: Delight Stare Other Clinician: Referring Malachai Schalk: Delight Stare Treating Kyonna Frier/Extender: STONE III, HOYT Weeks in Treatment: 4 Wound Status Wound Number: 1 Primary Diabetic Wound/Ulcer of the Lower Extremity Etiology: Wound Location: Right Lower Leg - Medial Wound Open Wounding Event: Blister Status: Date Acquired: 04/19/2018 Comorbid Cataracts, Hypertension, Peripheral Venous Weeks Of Treatment: 4 History: Disease, Type II Diabetes, End Stage Renal Clustered Wound: No Disease Photos Photo Uploaded By: Secundino Ginger on 06/16/2018 13:25:27 Wound Measurements Length: (cm) 0.1 Width: (cm) 0.1 Depth: (cm) 0.1 Area: (cm) 0.008 Volume: (cm) 0.001 % Reduction in Area: 99.9% % Reduction in Volume: 99.9% Epithelialization: Small (1-33%) Tunneling: No Undermining: No Wound Description Classification: Grade 2 Foul Odo Wound Margin: Flat and Intact Slough/F Exudate Amount: None Present r After Cleansing: No ibrino No Wound Bed Granulation Amount: None Present (0%) Exposed Structure Necrotic Amount: Small (1-33%) Fascia Exposed: No Necrotic Quality: Eschar Fat Layer (Subcutaneous Tissue) Exposed: Yes Tendon Exposed: No Muscle Exposed: No Joint Exposed: No Bone Exposed: No Periwound Skin Texture Texture Color No Abnormalities Noted: No No Abnormalities Noted: No Salome, Lamarcus R. ([id number]) Callus: No Atrophie Blanche: No Crepitus: No Cyanosis: No Excoriation: No Ecchymosis: No Induration: No Erythema: No Rash: No Hemosiderin Staining: Yes Scarring: Yes Mottled: No Pallor: No Moisture Rubor: No No Abnormalities Noted: No Dry / Scaly: Yes Temperature / Pain Maceration: No Temperature: No Abnormality Tenderness on Palpation: Yes Wound Preparation Ulcer Cleansing: Rinsed/Irrigated with Saline Topical Anesthetic Applied: Other: lidocaine 4%, Treatment  Notes Wound #1 (Right, Medial Lower Leg) Notes mepitel, hydrafera blue, ABD, 3 layer wrap; mepitel and plain foam to blister area Electronic Signature(s) Signed: 06/16/2018 1:34:38 PM By: Secundino Ginger Entered By: Secundino Ginger on 06/16/2018 13:13:31 Cockerham, Loren R. ([id number]) --------------------------------------------------------------------------------  Vitals Details Patient Name: LAVARR, PRESIDENT Date of Service: 06/16/2018 12:30 PM Medical Record Number: [id number] Patient Account Number: [id number] Date of Birth/Sex: [birth year] (85 y.o. M) Treating RN: Montey Hora Primary Care Jahir Halt: Delight Stare Other Clinician: Referring Jedadiah Abdallah: Delight Stare Treating Shiara Mcgough/Extender: STONE III, HOYT Weeks in Treatment: 4 Vital Signs Time Taken: 12:41 Temperature (F): 97.6 Height (in): 68 Pulse (bpm): 63 Weight (lbs): 150 Respiratory Rate (breaths/min): 16 Body Mass Index (BMI): 22.8 Blood Pressure (mmHg): 149/58 Reference Range: 80 - 120 mg / dl Electronic Signature(s) Signed: 06/16/2018 3:06:21 PM By: Lorine Bears RCP, RRT, CHT Entered By: Lorine Bears on 06/16/2018 12:45:15

## 2018-06-18 NOTE — Progress Notes (Signed)
THADDUS, MCDOWELL ([id number]) Visit Report for 06/16/2018 Chief Complaint Document Details Patient Name: [name], [name] Date of Service: 06/16/2018 12:30 PM Medical Record Number: [id number] Patient Account Number: [id number] Date of Birth/Sex: September 02, 1931 (85 y.o. M) Treating RN: Montey Hora Primary Care Provider: Delight Stare Other Clinician: Referring Provider: Delight Stare Treating Provider/Extender: Melburn Hake, HOYT Weeks in Treatment: 4 Information Obtained from: Patient Chief Complaint Right LE Ulcer Electronic Signature(s) Signed: 06/17/2018 8:35:34 AM By: Worthy Keeler PA-C Entered By: Worthy Keeler on 06/16/2018 13:17:18 Wanamaker, Cecil RMarland Kitchen ([id number]) -------------------------------------------------------------------------------- HPI Details Patient Name: [name] Date of Service: 06/16/2018 12:30 PM Medical Record Number: [id number] Patient Account Number: [id number] Date of Birth/Sex: 02-26-1932 (86 y.o. M) Treating RN: Montey Hora Primary Care Provider: Delight Stare Other Clinician: Referring Provider: Delight Stare Treating Provider/Extender: Melburn Hake, HOYT Weeks in Treatment: 4 History of Present Illness HPI Description: 05/19/18 on evaluation today patient presents for initial evaluation and clinic concerning issues that he has been having with his right medial lower leg ulcer. The patient has actually been seen in the emergency department for cellulitis he was admitted at [facility] on 04/19/18 discharged 04/21/18. He was noted to have proof of vascular disease and did undergo an angiogram on 04/20/18 by Dr. Leotis Pain. 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 Subsequently Dr. dew following intervention noted that the patient had depending on the location 20-40% residual stenosis noted and significant improvement in the overall vascular flow. Obviously this is  great news. With that being said the patient's wound he states does seem to be doing some better compared to previous. Upon inspection indeed it appears that he has some epithelialization. Overall I do not see any evidence of significant infection which is great news. No fevers chills noted 05/26/18 on evaluation today patient's wound actually appears to be doing much better at this time. With that being said I do think the [facility] Dressing a be sticking according to my nurse and this may be causing some issues as well. I'm gonna suggest at this point that we likely add mepitel underneath the [facility] Dressing help prevent this. Other than that everything seems to be doing excellent. 06/02/18 on evaluation today patient actually appears to be doing decently well in regard to his right lower extremity ulcer. This seems to show signs of good improvement which is excellent news. With that being said he did see vascular the good news is no further intervention is recommended nor necessary at this point. Actually placed in an [employer] and sent orders to home health which in the end it led to her switching the patient from the three layer compression wrap of the right lower extremity to an [employer] wrap. I feel like he did better in the three layer compression. With that being said the patient currently shows no signs of worsening the mepitel did help prevent any sticking to the wound bed which is great news. 06/12/18 an evaluation today patient presents for follow-up concerning his right lower extremity ulceration. The medial aspect ulcer appears to be doing fairly well this time which is good news. He has a blister on the lateral aspect although I'm concerned this may actually be due to the fact that his wrap is sliding down. We seem to be having issues with getting this wrapped appropriately were it will stay in place. Fortunately  there does not appear to be any signs of infection at this  time which is good news. He is seen with his son present at this point during the visit. 06/16/18 on evaluation today patient appears to be doing rather well and in fact appears to be almost completely healed in regard to his lower extremity ulcer on the right. The wrap much better this week with [place] wrapping it and just keeping him in that same wrap until follow-up. For that reason we will discontinue home health services. Fortunately there does not appear to be the evidence of infection at this point. Electronic Signature(s) Signed: 06/17/2018 8:35:34 AM By: Worthy Keeler PA-C Entered By: Worthy Keeler on 06/16/2018 17:19:34 [name], [name] ([id number]) -------------------------------------------------------------------------------- Physical Exam Details Patient Name: [name] Date of Service: 06/16/2018 12:30 PM Medical Record Number: [id number] Patient Account Number: [id number] Date of Birth/Sex: [birth year] (86 y.o. M) Treating RN: Montey Hora Primary Care Provider: Delight Stare Other Clinician: Referring Provider: Delight Stare Treating Provider/Extender: STONE III, HOYT Weeks in Treatment: 4 Constitutional Well-nourished and well-hydrated in no acute distress. Respiratory normal breathing without difficulty. clear to auscultation bilaterally. Cardiovascular regular rate and rhythm with normal S1, S2. trace pitting edema of the bilateral lower extremities. Psychiatric this patient is able to make decisions and demonstrates good insight into disease process. Alert and Oriented x 3. pleasant and cooperative. Notes Patient's wound bed currently did not require any sharp debridement and in fact appears to be almost completely healed which is great news. There is no signs of infection at this time which is also great news. Electronic Signature(s) Signed: 06/17/2018 8:35:34 AM By: Worthy Keeler PA-C Entered By: Worthy Keeler on 06/16/2018 17:20:24 [name], [name]  ([id number]) -------------------------------------------------------------------------------- Physician Orders Details Patient Name: [name] Date of Service: 06/16/2018 12:30 PM Medical Record Number: [id number] Patient Account Number: [id number] Date of Birth/Sex: 1931-11-03 (86 y.o. M) Treating RN: Montey Hora Primary Care Provider: Delight Stare Other Clinician: Referring Provider: Delight Stare Treating Provider/Extender: Melburn Hake, HOYT Weeks in Treatment: 4 Verbal / Phone Orders: No Diagnosis Coding ICD-10 Coding Code Description E11.622 Type 2 diabetes mellitus with other skin ulcer L97.812 Non-pressure chronic ulcer of other part of right lower leg with fat layer exposed I73.89 Other specified peripheral vascular diseases I10 Essential (primary) hypertension N18.4 Chronic kidney disease, stage 4 (severe) Wound Cleansing Wound #1 Right,Medial Lower Leg o Clean wound with Normal Saline. o Cleanse wound with mild soap and water Primary Wound Dressing Wound #1 Right,Medial Lower Leg o Mepitel One Contact layer - or adaptic/oil emulsion contact layer o Hydrafera Blue Ready Transfer o Other: - mepitel and plain foam to blister area Secondary Dressing Wound #1 Right,Medial Lower Leg o ABD pad Dressing Change Frequency Wound #1 Right,Medial Lower Leg o Change dressing every week Follow-up Appointments Wound #1 Right,Medial Lower Leg o Return Appointment in 1 week. Edema Control Wound #1 Right,Medial Lower Leg o 3 Layer Compression System - Right Lower Extremity - LEG IS TO BE WRAPPED FROM THE TOES TO 3CM BELOW THE KNEE o Elevate legs to the level of the heart and pump ankles as often as possible Home Health Wound #1 Right,Medial Lower Leg o D/C [place]. ([id number]) Electronic Signature(s) Signed: 06/16/2018 4:56:07 PM By: Montey Hora Signed: 06/17/2018 8:35:34 AM By: Worthy Keeler PA-C Entered By: Montey Hora on  06/16/2018 13:58:39 Einstein, Trendon R. ([id number]) -------------------------------------------------------------------------------- Problem List Details Patient Name: [name]. Date of  Service: 06/16/2018 12:30 PM Medical Record Number: [id number] Patient Account Number: [id number] Date of Birth/Sex: 12-Jun-1931 (85 y.o. M) Treating RN: Montey Hora Primary Care Provider: Delight Stare Other Clinician: Referring Provider: Delight Stare Treating Provider/Extender: Melburn Hake, HOYT Weeks in Treatment: 4 Active Problems ICD-10 Evaluated Encounter Code Description Active Date Today Diagnosis E11.622 Type 2 diabetes mellitus with other skin ulcer 05/19/2018 No Yes L97.812 Non-pressure chronic ulcer of other part of right lower leg 05/19/2018 No Yes with fat layer exposed I73.89 Other specified peripheral vascular diseases 05/19/2018 No Yes I10 Essential (primary) hypertension 05/19/2018 No Yes N18.4 Chronic kidney disease, stage 4 (severe) 05/19/2018 No Yes Inactive Problems Resolved Problems Electronic Signature(s) Signed: 06/17/2018 8:35:34 AM By: Worthy Keeler PA-C Entered By: Worthy Keeler on 06/16/2018 13:17:05 Satterwhite, [name] ([id number]) -------------------------------------------------------------------------------- Progress Note Details Patient Name: [name] Date of Service: 06/16/2018 12:30 PM Medical Record Number: [id number] Patient Account Number: [id number] Date of Birth/Sex: 1931/09/07 (86 y.o. M) Treating RN: Montey Hora Primary Care Provider: Delight Stare Other Clinician: Referring Provider: Delight Stare Treating Provider/Extender: Melburn Hake, HOYT Weeks in Treatment: 4 Subjective Chief Complaint Information obtained from Patient Right LE Ulcer History of Present Illness (HPI) 05/19/18 on evaluation today patient presents for initial evaluation and clinic concerning issues that he has been having with his right medial lower leg ulcer. The patient has actually  been seen in the emergency department for cellulitis he was admitted at [facility] on 04/19/18 discharged 04/21/18. He was noted to have proof of vascular disease and did undergo an angiogram on 04/20/18 by Dr. Leotis Pain. 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 Subsequently Dr. dew following intervention noted that the patient had depending on the location 20-40% residual stenosis noted and significant improvement in the overall vascular flow. Obviously this is great news. With that being said the patient's wound he states does seem to be doing some better compared to previous. Upon inspection indeed it appears that he has some epithelialization. Overall I do not see any evidence of significant infection which is great news. No fevers chills noted 05/26/18 on evaluation today patient's wound actually appears to be doing much better at this time. With that being said I do think the [facility] Dressing a be sticking according to my nurse and this may be causing some issues as well. I'm gonna suggest at this point that we likely add mepitel underneath the [facility] Dressing help prevent this. Other than that everything seems to be doing excellent. 06/02/18 on evaluation today patient actually appears to be doing decently well in regard to his right lower extremity ulcer. This seems to show signs of good improvement which is excellent news. With that being said he did see vascular the good news is no further intervention is recommended nor necessary at this point. Actually placed in an [employer] and sent orders to home health which in the end it led to her switching the patient from the three layer compression wrap of the right lower extremity to an [employer] wrap. I feel like he did better in the three layer compression. With that being said the patient currently shows no signs of  worsening the mepitel did help prevent any sticking to the wound bed which is great news. 06/12/18 an evaluation today patient presents for follow-up concerning his right lower extremity ulceration. The medial aspect ulcer appears  to be doing fairly well this time which is good news. He has a blister on the lateral aspect although I'm concerned this may actually be due to the fact that his wrap is sliding down. We seem to be having issues with getting this wrapped appropriately were it will stay in place. Fortunately there does not appear to be any signs of infection at this time which is good news. He is seen with his son present at this point during the visit. 06/16/18 on evaluation today patient appears to be doing rather well and in fact appears to be almost completely healed in regard to his lower extremity ulcer on the right. The wrap much better this week with [place] wrapping it and just keeping him in that same wrap until follow-up. For that reason we will discontinue home health services. Fortunately there does not appear to be the evidence of infection at this point. Patient History Information obtained from Patient. Family History Cancer - Mother, Diabetes - Siblings, Hypertension - Siblings, Stroke - Siblings, No family history of Heart Disease, Hereditary Spherocytosis, Kidney Disease, Lung Disease, Seizures, Thyroid Problems, Tuberculosis. [name], [name] ([id number]) Social History Never smoker, Alcohol Use - Never, Drug Use - No History, Caffeine Use - Rarely. Medical And Surgical History Notes Eyes Detached Retina 1980 Review of Systems (ROS) Constitutional Symptoms (General Health) Denies complaints or symptoms of Fever, Chills. Respiratory The patient has no complaints or symptoms. Cardiovascular Complains or has symptoms of LE edema. Psychiatric The patient has no complaints or symptoms. Objective Constitutional Well-nourished and well-hydrated in no acute distress. Vitals  Time Taken: 12:41 PM, Height: 68 in, Weight: 150 lbs, BMI: 22.8, Temperature: 97.6 F, Pulse: 63 bpm, Respiratory Rate: 16 breaths/min, Blood Pressure: 149/58 mmHg. Respiratory normal breathing without difficulty. clear to auscultation bilaterally. Cardiovascular regular rate and rhythm with normal S1, S2. trace pitting edema of the bilateral lower extremities. Psychiatric this patient is able to make decisions and demonstrates good insight into disease process. Alert and Oriented x 3. pleasant and cooperative. General Notes: Patient's wound bed currently did not require any sharp debridement and in fact appears to be almost completely healed which is great news. There is no signs of infection at this time which is also great news. Integumentary (Hair, Skin) Wound #1 status is Open. Original cause of wound was Blister. The wound is located on the Right,Medial Lower Leg. The wound measures 0.1cm length x 0.1cm width x 0.1cm depth; 0.008cm^2 area and 0.001cm^3 volume. There is Fat Layer (Subcutaneous Tissue) Exposed exposed. There is no tunneling or undermining noted. There is a none present amount of drainage noted. The wound margin is flat and intact. There is no granulation within the wound bed. There is a small (1-33%) amount of necrotic tissue within the wound bed including Eschar. The periwound skin appearance exhibited: Scarring, Dry/Scaly, Hemosiderin Staining. The periwound skin appearance did not exhibit: Callus, Crepitus, Excoriation, Induration, Rash, Maceration, Atrophie Blanche, Cyanosis, Ecchymosis, Mottled, Pallor, Rubor, Erythema. Periwound temperature was noted as No Abnormality. The periwound has tenderness on palpation. [name], [name] ([id number]) Other Condition(s) Patient presents with Other Dermatologic Condition located on the Right Leg. The skin appearance had no abnormalities noted for: Texture. The skin appearance did not exhibit: Atrophie Blanche, Callus, Crepitus,  Cyanosis, Dry/Scaly, Ecchymosis, Erythema, Excoriation, Friable, Hemosiderin Staining, Induration, Maceration, Mottled, Pallor, Rash, Rubor, Scarring. Assessment Active Problems ICD-10 Type 2 diabetes mellitus with other skin ulcer Non-pressure chronic ulcer of other part of right lower leg with fat layer  exposed Other specified peripheral vascular diseases Essential (primary) hypertension Chronic kidney disease, stage 4 (severe) Procedures Wound #1 Pre-procedure diagnosis of Wound #1 is a Diabetic Wound/Ulcer of the Lower Extremity located on the Right,Medial Lower Leg . There was a Three Layer Compression Therapy Procedure with a pre-treatment ABI of 0.9 by Montey Hora, RN. Post procedure Diagnosis Wound #1: Same as Pre-Procedure Plan Wound Cleansing: Wound #1 Right,Medial Lower Leg: Clean wound with Normal Saline. Cleanse wound with mild soap and water Primary Wound Dressing: Wound #1 Right,Medial Lower Leg: Mepitel One Contact layer - or adaptic/oil emulsion contact layer Hydrafera Blue Ready Transfer Other: - mepitel and plain foam to blister area Secondary Dressing: Wound #1 Right,Medial Lower Leg: ABD pad Dressing Change Frequency: Wound #1 Right,Medial Lower Leg: Change dressing every week Follow-up Appointments: Wound #1 Right,Medial Lower Leg: Return Appointment in 1 week. Edema Control: [name], [name] R. ([id number]) Wound #1 Right,Medial Lower Leg: 3 Layer Compression System - Right Lower Extremity - LEG IS TO BE WRAPPED FROM THE TOES TO 3CM BELOW THE KNEE Elevate legs to the level of the heart and pump ankles as often as possible Home Health: Wound #1 Right,Medial Lower Leg: D/C Home Health Services I am going to suggest currently that we continue with the above wound care measures for the next week. My hope is that he will continue to show signs of improvement and that this area where it appears to be most likely healed at the wound site will toughen up to  the point that he can apply his compression stockings which have been ordered. The patient is in agreement the plan as is his daughter. We will see were things stand at follow-up. Please see above for specific wound care orders. We will see patient for re-evaluation in 1 week(s) here in the clinic. If anything worsens or changes patient will contact our office for additional recommendations. Electronic Signature(s) Signed: 06/17/2018 8:35:34 AM By: Worthy Keeler PA-C Entered By: Worthy Keeler on 06/16/2018 17:20:47 [name], [name] ([id number]) -------------------------------------------------------------------------------- ROS/PFSH Details Patient Name: [name] Date of Service: 06/16/2018 12:30 PM Medical Record Number: [id number] Patient Account Number: [id number] Date of Birth/Sex: Mar 02, 1932 (86 y.o. M) Treating RN: Montey Hora Primary Care Provider: Delight Stare Other Clinician: Referring Provider: Delight Stare Treating Provider/Extender: STONE III, HOYT Weeks in Treatment: 4 Information Obtained From Patient Wound History Do you currently have one or more open woundso Yes How many open wounds do you currently haveo 1 Approximately how long have you had your woundso 3 months How have you been treating your wound(s) until nowo bandage, Home Health treating Has your wound(s) ever healed and then re-openedo No Have you had any lab work done in the past montho Yes Who ordered the lab work [place] Have you tested positive for an antibiotic resistant organism (MRSA, VRE)o No Have you tested positive for osteomyelitis (bone infection)o No Have you had any tests for circulation on your legso Yes Where was the test doneo  [facility] Have you had other problems associated with your woundso Infection Constitutional Symptoms (General Health) Complaints and Symptoms: Negative for: Fever; Chills Cardiovascular Complaints and  Symptoms: Positive for: LE edema Medical History: Positive for: Hypertension - takes medication; Peripheral Venous Disease Eyes Medical History: Positive for: Cataracts Negative for: Glaucoma; Optic Neuritis Past Medical History Notes: Detached Retina 1980 Hematologic/Lymphatic Medical History: Negative for: Anemia; Hemophilia; Human Immunodeficiency Virus; Lymphedema; Sickle Cell Disease Respiratory Complaints and Symptoms: No Complaints  or Symptoms Medical HistoryDAMEER, [name] ([id number]) Negative for: Aspiration; Asthma; Chronic Obstructive Pulmonary Disease (COPD); Pneumothorax; Sleep Apnea; Tuberculosis Gastrointestinal Medical History: Negative for: Cirrhosis ; Colitis; Crohnos; Hepatitis A; Hepatitis B; Hepatitis C Endocrine Medical History: Positive for: Type II Diabetes - 15 years Time with diabetes: 15 years Treated with: Oral agents Blood sugar tested every day: Yes Tested : 205 Genitourinary Medical History: Positive for: End Stage Renal Disease - Stage 4, no dialysis, loses protein Immunological Medical History: Negative for: Lupus Erythematosus; Raynaudos; Scleroderma Integumentary (Skin) Medical History: Negative for: History of Burn; History of pressure wounds Musculoskeletal Medical History: Negative for: Gout; Rheumatoid Arthritis; Osteoarthritis; Osteomyelitis Neurologic Medical History: Negative for: Dementia; Neuropathy; Quadriplegia; Paraplegia; Seizure Disorder Oncologic Medical History: Negative for: Received Chemotherapy; Received Radiation Psychiatric Complaints and Symptoms: No Complaints or Symptoms Medical History: Negative for: Anorexia/bulimia; Confinement Anxiety HBO Extended History Items Eyes: Cataracts Castrejon, [name] R. ([id number]) Immunizations Pneumococcal Vaccine: Received Pneumococcal Vaccination: Yes Implantable Devices Family and Social History Cancer: Yes - Mother; Diabetes: Yes - Siblings; Heart Disease: No;  Hereditary Spherocytosis: No; Hypertension: Yes - Siblings; Kidney Disease: No; Lung Disease: No; Seizures: No; Stroke: Yes - Siblings; Thyroid Problems: No; Tuberculosis: No; Never smoker; Alcohol Use: Never; Drug Use: No History; Caffeine Use: Rarely; Financial Concerns: No; Food, Clothing or Shelter Needs: No; Support System Lacking: No; Transportation Concerns: No; Advanced Directives: Yes (Not Provided); Patient does not want information on Advanced Directives; Living Will: Yes (Not Provided) Physician Affirmation I have reviewed and agree with the above information. Electronic Signature(s) Signed: 06/17/2018 8:35:34 AM By: Worthy Keeler PA-C Signed: 06/17/2018 5:48:26 PM By: Montey Hora Entered By: Worthy Keeler on 06/16/2018 17:20:06 [name], [name] ([id number]) -------------------------------------------------------------------------------- SuperBill Details Patient Name: [name] Date of Service: 06/16/2018 Medical Record Number: [id number] Patient Account Number: [id number] Date of Birth/Sex: [birth year] (86 y.o. M) Treating RN: Montey Hora Primary Care Provider: Delight Stare Other Clinician: Referring Provider: Delight Stare Treating Provider/Extender: Melburn Hake, HOYT Weeks in Treatment: 4 Diagnosis Coding ICD-10 Codes Code Description E11.622 Type 2 diabetes mellitus with other skin ulcer L97.812 Non-pressure chronic ulcer of other part of right lower leg with fat layer exposed I73.89 Other specified peripheral vascular diseases I10 Essential (primary) hypertension N18.4 Chronic kidney disease, stage 4 (severe) Facility Procedures CPT4 Code Description: [id number] (Facility Use Only) [phone] - APPLY [facility] LWR RT LEG Modifier: Quantity: 1 Physician Procedures CPT4 Code Description: [id number] - WC PHYS LEVEL 4 - EST PT ICD-10 Diagnosis Description E11.622 Type 2 diabetes mellitus with other skin ulcer L97.812 Non-pressure chronic ulcer of other part of  right lower leg wi I73.89 Other specified peripheral  vascular diseases I10 Essential (primary) hypertension Modifier: th fat layer expos Quantity: 1 ed Electronic Signature(s) Signed: 06/17/2018 8:35:34 AM By: Worthy Keeler PA-C Previous Signature: 06/16/2018 4:56:07 PM Version By: Montey Hora Entered By: Worthy Keeler on 06/16/2018 17:21:04

## 2018-06-19 NOTE — Progress Notes (Signed)
REMIJIO, HOLLERAN ([id number]) Visit Report for 06/12/2018 Chief Complaint Document Details Patient Name: [name], [name] Date of Service: 06/12/2018 8:00 AM Medical Record Number: [id number] Patient Account Number: [id number] Date of Birth/Sex: [birth year] (85 y.o. M) Treating RN: [name] Primary Care Provider: Delight [name] Other Clinician: Referring Provider: Delight [name] Treating Provider/Extender: [name], [name] Weeks in Treatment: 3 Information Obtained from: Patient Chief Complaint Right LE Ulcer Electronic Signature(s) Signed: 06/18/2018 10:02:40 AM By: [name] Keeler PA-C Entered By: [name] on 06/12/2018 08:34:07 [name], [name] ([id number]) -------------------------------------------------------------------------------- HPI Details Patient Name: [name] Date of Service: 06/12/2018 8:00 AM Medical Record Number: [id number] Patient Account Number: [id number] Date of Birth/Sex: 05-Jun-1932 (85 y.o. M) Treating RN: [name] Primary Care Provider: Delight [name] Other Clinician: Referring Provider: Delight [name] Treating Provider/Extender: [name], [name] Weeks in Treatment: 3 History of Present Illness HPI Description: 05/19/18 on evaluation today patient presents for initial evaluation and clinic concerning issues that he has been having with his right medial lower leg ulcer. The patient has actually been seen in the emergency department for cellulitis he was admitted at [facility] on 04/19/18 discharged 04/21/18. He was noted to have proof of vascular disease and did undergo an angiogram on 04/20/18 by Dr. Leotis [name]. 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 Subsequently Dr. dew following [name] noted that the patient had depending on the location 20-40% residual stenosis noted and significant improvement in the overall vascular flow. Obviously this is  great news. With that being said the patient's wound he states does seem to be doing some better compared to previous. Upon inspection indeed it appears that he has some epithelialization. Overall I do not see any evidence of significant infection which is great news. No fevers chills noted 05/26/18 on evaluation today patient's wound actually appears to be doing much better at this time. With that being said I do think the [facility] Dressing a be sticking according to my nurse and this may be causing some issues as well. I'm gonna suggest at this point that we likely add mepitel underneath the [facility] Dressing help prevent this. Other than that everything seems to be doing excellent. 06/02/18 on evaluation today patient actually appears to be doing decently well in regard to his right lower extremity ulcer. This seems to show signs of good improvement which is excellent news. With that being said he did see vascular the good news is no further [name] is recommended nor necessary at this point. Actually placed in an [employer] and sent orders to home health which in the end it led to her switching the patient from the three layer compression wrap of the right lower extremity to an [employer] wrap. I feel like he did better in the three layer compression. With that being said the patient currently shows no signs of worsening the mepitel did help prevent any sticking to the wound bed which is great news. 06/12/18 an evaluation today patient presents for follow-up concerning his right lower extremity ulceration. The medial aspect ulcer appears to be doing fairly well this time which is good news. He has a blister on the lateral aspect although I'm concerned this may actually be due to the fact that his wrap is sliding down. We seem to be having issues with getting this wrapped appropriately were it will stay in place. Fortunately  there does not appear to be any signs of infection at this  time which is good news. He is seen with his son present at this point during the visit. Electronic Signature(s) Signed: 06/18/2018 10:02:40 AM By: [name] Keeler PA-C Entered By: [name] on 06/12/2018 09:22:08 HAZAEL, OLVEDA ([id number]) -------------------------------------------------------------------------------- Physical Exam Details Patient Name: [name] Date of Service: 06/12/2018 8:00 AM Medical Record Number: [id number] Patient Account Number: [id number] Date of Birth/Sex: 05-19-1932 (85 y.o. M) Treating RN: [name] Primary Care Provider: Delight [name] Other Clinician: Referring Provider: Delight [name] Treating Provider/Extender: STONE [name], [name] Weeks in Treatment: 3 Constitutional Well-nourished and well-hydrated in no acute distress. Respiratory normal breathing without difficulty. clear to auscultation bilaterally. Cardiovascular regular rate and rhythm with normal S1, S2. 2+ pitting edema of the bilateral lower extremities. Psychiatric this patient is able to make decisions and demonstrates good insight into disease process. Alert and Oriented x 3. pleasant and cooperative. Notes Patient's wound bed currently shows evidence of good granulation and epithelialization. The new area of blistering appears to be a blood blister likely due to the fact that the wrap slid them his leg. Nonetheless I think we may want to switch to just putting home health on hold and leaving the wrap that we place on for a week at a time as I do not feel that he is likely draining enough that we have to change the wrap multiple times a week. The patient and his son are okay with attempting this. Electronic Signature(s) Signed: 06/18/2018 10:02:40 AM By: [name] Keeler PA-C Entered By: [name] on 06/12/2018 09:22:58 [name], [name] ([id number]) -------------------------------------------------------------------------------- Physician Orders Details Patient Name: [name] Date of Service: 06/12/2018 8:00 AM Medical Record Number: [id number] Patient Account Number: [id number] Date of Birth/Sex: 05/29/1932 (86 y.o. M) Treating RN: [name] Primary Care Provider: Delight [name] Other Clinician: Referring Provider: Delight [name] Treating Provider/Extender: [name], [name] Weeks in Treatment: 3 Verbal / Phone Orders: No Diagnosis Coding ICD-10 Coding Code Description E11.622 Type 2 diabetes mellitus with other skin ulcer L97.812 Non-pressure chronic ulcer of other part of right lower leg with fat layer exposed I73.89 Other specified peripheral vascular diseases I10 Essential (primary) hypertension N18.4 Chronic kidney disease, stage 4 (severe) Wound Cleansing Wound #1 Right,Medial Lower Leg o Clean wound with Normal Saline. o Cleanse wound with mild soap and water Primary Wound Dressing Wound #1 Right,Medial Lower Leg o Mepitel One Contact layer - or adaptic/oil emulsion contact layer o Hydrafera Blue Ready Transfer o Other: - mepitel and plain foam to blister area Secondary Dressing Wound #1 Right,Medial Lower Leg o ABD pad Dressing Change Frequency Wound #1 Right,Medial Lower Leg o Change dressing every week - wrap will be changed next on 06/16/18 by [facility] Follow-up Appointments Wound #1 Right,Medial Lower Leg o Return Appointment in 1 week. Edema Control Wound #1 Right,Medial Lower Leg o 3 Layer Compression System - Right Lower Extremity - LEG IS TO BE WRAPPED FROM THE TOES TO 3CM BELOW THE KNEE o Elevate legs to the level of the heart and pump ankles as often as possible Home Health Wound #1 Right,Medial Lower Leg o [place] Visits - HOLD [facility] VISITS FOR THIS WEEK UNTIL 06/16/18 OFFICE VISIT [name], [name] ([id number]) o Home Health Nurse may visit PRN to address patientos wound care needs. o FACE TO FACE ENCOUNTER: MEDICARE and MEDICAID PATIENTS: I certify that this patient is under my  care  and that I had a face-to-face encounter that meets the physician face-to-face encounter requirements with this patient on this date. The encounter with the patient was in whole or in part for the following MEDICAL CONDITION: (primary reason for [place]) MEDICAL NECESSITY: I certify, that based on my findings, NURSING services are a medically necessary home health service. HOME BOUND STATUS: I certify that my clinical findings support that this patient is homebound (i.e., Due to illness or injury, pt requires aid of supportive devices such as crutches, cane, wheelchairs, walkers, the use of special transportation or the assistance of another person to leave their place of residence. There is a normal inability to leave the home and doing so requires considerable and taxing effort. Other absences are for medical reasons / religious services and are infrequent or of short duration when for other reasons). o If current dressing causes regression in wound condition, may D/C ordered dressing product/s and apply Normal Saline Moist Dressing daily until next [place] / Other MD appointment. [place] of regression in wound condition at [phone]. o Please direct any NON-WOUND related issues/requests for orders to patient's Primary Care Physician Electronic Signature(s) Signed: 06/12/2018 5:14:43 PM By: [name] Signed: 06/18/2018 10:02:40 AM By: [name] Keeler PA-C Entered By: [name] on 06/12/2018 08:53:03 [place], [name] ([id number]) -------------------------------------------------------------------------------- Problem List Details Patient Name: [name] Date of Service: 06/12/2018 8:00 AM Medical Record Number: [id number] Patient Account Number: [id number] Date of Birth/Sex: [birth year] (86 y.o. M) Treating RN: [name] Primary Care Provider: Delight [name] Other Clinician: Referring Provider: Delight [name] Treating  Provider/Extender: [name], [name] Weeks in Treatment: 3 Active Problems ICD-10 Evaluated Encounter Code Description Active Date Today Diagnosis E11.622 Type 2 diabetes mellitus with other skin ulcer 05/19/2018 No Yes L97.812 Non-pressure chronic ulcer of other part of right lower leg 05/19/2018 No Yes with fat layer exposed I73.89 Other specified peripheral vascular diseases 05/19/2018 No Yes I10 Essential (primary) hypertension 05/19/2018 No Yes N18.4 Chronic kidney disease, stage 4 (severe) 05/19/2018 No Yes Inactive Problems Resolved Problems Electronic Signature(s) Signed: 06/18/2018 10:02:40 AM By: [name] Keeler PA-C Entered By: [name] on 06/12/2018 08:34:00 [name], [name] ([id number]) -------------------------------------------------------------------------------- Progress Note Details Patient Name: [name] Date of Service: 06/12/2018 8:00 AM Medical Record Number: [id number] Patient Account Number: [id number] Date of Birth/Sex: 11-03-1931 (86 y.o. M) Treating RN: [name] Primary Care Provider: Delight [name] Other Clinician: Referring Provider: Delight [name] Treating Provider/Extender: [name], [name] Weeks in Treatment: 3 Subjective Chief Complaint Information obtained from Patient Right LE Ulcer History of Present Illness (HPI) 05/19/18 on evaluation today patient presents for initial evaluation and clinic concerning issues that he has been having with his right medial lower leg ulcer. The patient has actually been seen in the emergency department for cellulitis he was admitted at [facility] on 04/19/18 discharged 04/21/18. He was noted to have proof of vascular disease and did undergo an angiogram on 04/20/18 by Dr. Leotis [name]. 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 Subsequently Dr. dew following [name] noted that the patient had  depending on the location 20-40% residual stenosis noted and significant improvement in the overall vascular flow. Obviously this is great news. With that being said the patient's wound he states does seem to be doing some better compared to previous. Upon inspection indeed it appears that he has  some epithelialization. Overall I do not see any evidence of significant infection which is great news. No fevers chills noted 05/26/18 on evaluation today patient's wound actually appears to be doing much better at this time. With that being said I do think the [facility] Dressing a be sticking according to my nurse and this may be causing some issues as well. I'm gonna suggest at this point that we likely add mepitel underneath the [facility] Dressing help prevent this. Other than that everything seems to be doing excellent. 06/02/18 on evaluation today patient actually appears to be doing decently well in regard to his right lower extremity ulcer. This seems to show signs of good improvement which is excellent news. With that being said he did see vascular the good news is no further [name] is recommended nor necessary at this point. Actually placed in an [employer] and sent orders to home health which in the end it led to her switching the patient from the three layer compression wrap of the right lower extremity to an [employer] wrap. I feel like he did better in the three layer compression. With that being said the patient currently shows no signs of worsening the mepitel did help prevent any sticking to the wound bed which is great news. 06/12/18 an evaluation today patient presents for follow-up concerning his right lower extremity ulceration. The medial aspect ulcer appears to be doing fairly well this time which is good news. He has a blister on the lateral aspect although I'm concerned this may actually be due to the fact that his wrap is sliding down. We seem to be having issues with  getting this wrapped appropriately were it will stay in place. Fortunately there does not appear to be any signs of infection at this time which is good news. He is seen with his son present at this point during the visit. Patient History Information obtained from Patient. Family History Cancer - Mother, Diabetes - Siblings, Hypertension - Siblings, Stroke - Siblings, No family history of Heart Disease, Hereditary Spherocytosis, Kidney Disease, Lung Disease, Seizures, Thyroid Problems, Tuberculosis. Social History Never smoker, Alcohol Use - Never, Drug Use - No History, Caffeine Use - Rarely. Medical And Surgical History Notes [name], [name] ([id number]) Eyes Detached Retina 1980 Review of Systems (ROS) Constitutional Symptoms (General Health) Denies complaints or symptoms of Fever, Chills. Respiratory The patient has no complaints or symptoms. Cardiovascular Complains or has symptoms of LE edema. Psychiatric The patient has no complaints or symptoms. Objective Constitutional Well-nourished and well-hydrated in no acute distress. Vitals Time Taken: 8:10 AM, Height: 68 in, Weight: 150 lbs, BMI: 22.8, Temperature: 97.6 F, Pulse: 66 bpm, Respiratory Rate: 16 breaths/min, Blood Pressure: 143/53 mmHg. Respiratory normal breathing without difficulty. clear to auscultation bilaterally. Cardiovascular regular rate and rhythm with normal S1, S2. 2+ pitting edema of the bilateral lower extremities. Psychiatric this patient is able to make decisions and demonstrates good insight into disease process. Alert and Oriented x 3. pleasant and cooperative. General Notes: Patient's wound bed currently shows evidence of good granulation and epithelialization. The new area of blistering appears to be a blood blister likely due to the fact that the wrap slid them his leg. Nonetheless I think we may want to switch to just putting home health on hold and leaving the wrap that we place on for a week  at a time as I do not feel that he is likely draining enough that we have to change the wrap  multiple times a week. The patient and his son are okay with attempting this. Integumentary (Hair, Skin) Wound #1 status is Open. Original cause of wound was Blister. The wound is located on the Right,Medial Lower Leg. The wound measures 2.5cm length x 1cm width x 0.1cm depth; 1.963cm^2 area and 0.196cm^3 volume. There is Fat Layer (Subcutaneous Tissue) Exposed exposed. There is no tunneling or undermining noted. There is a medium amount of serosanguineous drainage noted. The wound margin is flat and intact. There is large (67-100%) red granulation within the wound bed. There is a small (1-33%) amount of necrotic tissue within the wound bed including Adherent Slough. The periwound skin appearance exhibited: Scarring, Dry/Scaly, Hemosiderin Staining. The periwound skin appearance did not exhibit: Callus, Crepitus, Excoriation, Induration, Rash, Maceration, Atrophie Blanche, Cyanosis, Ecchymosis, Mottled, Pallor, Rubor, Erythema. Periwound temperature was noted as No Abnormality. The periwound has tenderness on palpation. 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 There was a Three Layer Compression Therapy Procedure with a pre-treatment ABI of 0.9 by [name] Hora, RN. Post procedure Diagnosis Wound #1: Same as Pre-Procedure Plan Wound Cleansing: Wound #1 Right,Medial Lower Leg: Clean wound with Normal Saline. Cleanse wound with mild soap and water Primary Wound Dressing: Wound #1  Right,Medial Lower Leg: Mepitel One Contact layer - or adaptic/oil emulsion contact layer Hydrafera Blue Ready Transfer Other: - mepitel and plain foam to blister area Secondary Dressing: Wound #1 Right,Medial Lower Leg: ABD pad Dressing Change Frequency: Wound #1 Right,Medial Lower Leg: Change dressing every week - wrap will be changed next on 06/16/18 by [facility] Follow-up Appointments: Wound #1 Right,Medial Lower Leg: Return Appointment in 1 week. Edema Control: Wound #1 Right,Medial Lower Leg: 3 Layer Compression System - Right Lower Extremity - LEG IS TO BE WRAPPED FROM THE TOES TO 3CM BELOW THE KNEE Elevate legs to the level of the heart and pump ankles as often as possible Home Health: Wound #1 Right,Medial Lower Leg: Continue Home Health Visits - HOLD [facility] VISITS FOR THIS WEEK UNTIL 06/16/18 OFFICE VISIT [name], [name] ([id number]) Home Health Nurse may visit PRN to address patient s wound care needs. FACE TO FACE ENCOUNTER: MEDICARE and MEDICAID PATIENTS: I certify that this patient is under my care and that I had a face-to-face encounter that meets the physician face-to-face encounter requirements with this patient on this date. The encounter with the patient was in whole or in part for the following MEDICAL CONDITION: (primary reason for [place]) MEDICAL NECESSITY: I certify, that based on my findings, NURSING services are a medically necessary home health service. HOME BOUND STATUS: I certify that my clinical findings support that this patient is homebound (i.e., Due to illness or injury, pt requires aid of supportive devices such as crutches, cane, wheelchairs, walkers, the use of special transportation or the assistance of another person to leave their place of residence. There is a normal inability to leave the home and doing so requires considerable and taxing effort. Other absences are for medical reasons / religious services and are infrequent or  of short duration when for other reasons). If current dressing causes regression in wound condition, may  D/C ordered dressing product/s and apply Normal Saline Moist Dressing daily until next [place] / Other MD appointment. [place] of regression in wound condition at [phone]. Please direct any NON-WOUND related issues/requests for orders to patient's Primary Care Physician I'm in a recommend currently that we go ahead and continue with the above wound care measures for the next week. Patient is in agreement with the plan. If anything changes in the meantime he will contact the office and let me know. Please see above for specific wound care orders. We will see patient for re-evaluation in 1 week(s) here in the clinic. If anything worsens or changes patient will contact our office for additional recommendations. Electronic Signature(s) Signed: 06/18/2018 10:02:40 AM By: [name] Keeler PA-C Entered By: [name] on 06/12/2018 09:23:31 [name], [name] ([id number]) -------------------------------------------------------------------------------- ROS/PFSH Details Patient Name: [name] Date of Service: 06/12/2018 8:00 AM Medical Record Number: [id number] Patient Account Number: [id number] Date of Birth/Sex: August 01, 1931 (86 y.o. M) Treating RN: [name] Primary Care Provider: Delight [name] Other Clinician: Referring Provider: Delight [name] Treating Provider/Extender: STONE [name], [name] Weeks in Treatment: 3 Information Obtained From Patient Wound History Do you currently have one or more open woundso Yes How many open wounds do you currently haveo 1 Approximately how long have you had your woundso 3 months How have you been treating your wound(s) until nowo bandage, Home Health treating Has your wound(s) ever healed and then re-openedo No Have you had any lab work done in the past montho Yes Who ordered the lab work [place] Have you tested positive for an antibiotic resistant organism (MRSA, VRE)o No Have you tested positive for osteomyelitis (bone infection)o No Have you had any tests for circulation on your legso Yes Where was the test doneo [facility] Have you had other problems associated with your woundso Infection Constitutional Symptoms (General Health) Complaints and Symptoms: Negative for: Fever; Chills Cardiovascular Complaints and Symptoms: Positive for: LE edema Medical History: Positive for: Hypertension - takes medication; Peripheral Venous Disease Eyes Medical History: Positive for: Cataracts Negative for: Glaucoma; Optic Neuritis Past Medical History Notes: Detached Retina 1980 Hematologic/Lymphatic Medical History: Negative for: Anemia; Hemophilia; Human Immunodeficiency Virus; Lymphedema; Sickle Cell Disease Respiratory Complaints and Symptoms: No Complaints or Symptoms Medical HistoryFARAAZ, WOLIN. ([id number]) Negative for: Aspiration; Asthma; Chronic Obstructive Pulmonary Disease (COPD); Pneumothorax; Sleep Apnea; Tuberculosis Gastrointestinal Medical History: Negative for: Cirrhosis ; Colitis; Crohnos; Hepatitis A; Hepatitis B; Hepatitis C Endocrine Medical History: Positive for: Type II Diabetes - 15 years Time with diabetes: 15 years Treated with: Oral agents Blood sugar tested every day: Yes Tested : 205 Genitourinary Medical History: Positive for: End Stage Renal Disease - Stage 4, no dialysis, loses protein Immunological Medical History: Negative for: Lupus Erythematosus; Raynaudos; Scleroderma Integumentary (Skin) Medical History: Negative for: History of Burn; History of pressure wounds Musculoskeletal Medical History: Negative for: Gout; Rheumatoid Arthritis; Osteoarthritis; Osteomyelitis Neurologic Medical History: Negative for: Dementia; Neuropathy; Quadriplegia; Paraplegia; Seizure Disorder Oncologic Medical  History: Negative for: Received Chemotherapy; Received Radiation Psychiatric Complaints and Symptoms: No Complaints or Symptoms Medical History: Negative for: Anorexia/bulimia; Confinement Anxiety HBO Extended History Items Eyes: Cataracts Braddy, Tannen R. ([id number]) Immunizations Pneumococcal Vaccine: Received Pneumococcal Vaccination: Yes Implantable Devices Family and Social History Cancer: Yes - Mother; Diabetes: Yes - Siblings; Heart Disease: No; Hereditary Spherocytosis: No; Hypertension: Yes - Siblings; Kidney Disease: No; Lung Disease: No; Seizures: No; Stroke: Yes - Siblings; Thyroid Problems:  No; Tuberculosis: No; Never smoker; Alcohol Use: Never; Drug Use: No History; Caffeine Use: Rarely; Financial Concerns: No; Food, Clothing or Shelter Needs: No; Support System Lacking: No; Transportation Concerns: No; Advanced Directives: Yes (Not Provided); Patient does not want information on Advanced Directives; Living Will: Yes (Not Provided) Physician Affirmation I have reviewed and agree with the above information. Electronic Signature(s) Signed: 06/12/2018 5:14:43 PM By: [name] Signed: 06/18/2018 10:02:40 AM By: [name] Keeler PA-C Entered By: [name] on 06/12/2018 09:22:24 ROBER, SKEELS ([id number]) -------------------------------------------------------------------------------- SuperBill Details Patient Name: [name] Date of Service: 06/12/2018 Medical Record Number: [id number] Patient Account Number: [id number] Date of Birth/Sex: [birth year] (85 y.o. M) Treating RN: [name] Primary Care Provider: Delight [name] Other Clinician: Referring Provider: Delight [name] Treating Provider/Extender: [name], [name] Weeks in Treatment: 3 Diagnosis Coding ICD-10 Codes Code Description E11.622 Type 2 diabetes mellitus with other skin ulcer L97.812 Non-pressure chronic ulcer of other part of right lower leg with fat layer exposed I73.89 Other specified peripheral  vascular diseases I10 Essential (primary) hypertension N18.4 Chronic kidney disease, stage 4 (severe) Facility Procedures CPT4 Code Description: [id number] (Facility Use Only) [phone] - APPLY Cyril LWR RT LEG Modifier: Quantity: 1 Physician Procedures CPT4 Code Description: [id number] - WC PHYS LEVEL 4 - EST PT ICD-10 Diagnosis Description E11.622 Type 2 diabetes mellitus with other skin ulcer L97.812 Non-pressure chronic ulcer of other part of right lower leg wi I73.89 Other specified peripheral  vascular diseases I10 Essential (primary) hypertension Modifier: th fat layer expos Quantity: 1 ed Electronic Signature(s) Signed: 06/18/2018 10:02:40 AM By: [name] Keeler PA-C Entered By: [name] on 06/12/2018 09:23:47

## 2018-06-19 NOTE — Progress Notes (Signed)
STORY, VANVRANKEN ([id number]) Visit Report for 06/12/2018 Arrival Information Details Patient Name: [name], [name] Date of Service: 06/12/2018 8:00 AM Medical Record Number: [id number] Patient Account Number: [id number] Date of Birth/Sex: Jun 23, 1931 (85 y.o. M) Treating RN: [name] Primary Care [name]: [name] Other Clinician: Referring [name]: [name] Treating Vashon Arch/Extender: [name], [name]: 3 Visit Information History Since Last Visit Added or deleted any medications: No Patient Arrived: Ambulatory Any new allergies or adverse reactions: No Arrival Time: 08:07 Had a fall or experienced change in No Accompanied By: son activities of daily living that may affect Transfer Assistance: None risk of falls: Patient Identification Verified: Yes Signs or symptoms of abuse/neglect since last visito No Secondary Verification Process Completed: Yes Hospitalized since last visit: No Implantable device outside of the clinic excluding No cellular tissue based products placed in the center since last visit: Has Dressing in Place as Prescribed: Yes Pain Present Now: Yes Electronic Signature(s) Signed: 06/12/2018 4:39:33 PM By: [name] [name] Entered By: [name] on 06/12/2018 08:09:58 [name], [name] ([id number]) -------------------------------------------------------------------------------- Compression Therapy Details Patient Name: [name] Date of Service: 06/12/2018 8:00 AM Medical Record Number: [id number] Patient Account Number: [id number] Date of Birth/Sex: 1931-06-24 (86 y.o. M) Treating RN: [name] Primary Care [name]: [name] Other Clinician: Referring [name]: [name] Treating Keliyah Spillman/Extender: [name] III, [name]: 3 Compression Therapy Performed for [name] Assessment: [name] #1 Right,Medial Lower Leg Performed By: Clinician [name] Hora, RN Compression Type:  Three Layer Pre [name] ABI: 0.9 Post Procedure Diagnosis Same as Pre-procedure Electronic Signature(s) Signed: 06/12/2018 5:14:43 PM By: [name] Entered By: [name] on 06/12/2018 08:53:42 [name], [name] ([id number]) -------------------------------------------------------------------------------- Encounter Discharge Information Details Patient Name: [name] Date of Service: 06/12/2018 8:00 AM Medical Record Number: [id number] Patient Account Number: [id number] Date of Birth/Sex: 1931-10-01 (86 y.o. M) Treating RN: [name] Primary Care Valita Righter: [name] Other Clinician: Referring Anyi Fels: [name] Treating Keriana Sarsfield/Extender: [name], [name]: 3 Encounter Discharge Information Items Discharge Condition: Stable Ambulatory Status: Ambulatory Discharge Destination: Home Transportation: Private Auto Accompanied By: son Schedule Follow-up Appointment: Yes Clinical Summary of Care: Electronic Signature(s) Signed: 06/12/2018 5:14:43 PM By: [name] Entered By: [name] on 06/12/2018 08:54:39 [name], [name] ([id number]) -------------------------------------------------------------------------------- Lower Extremity Assessment Details Patient Name: [name] Date of Service: 06/12/2018 8:00 AM Medical Record Number: [id number] Patient Account Number: [id number] Date of Birth/Sex: 1932/04/16 (86 y.o. M) Treating RN: [name] Primary Care Yuepheng Schaller: [name] Other Clinician: Referring [name]: [name] Treating [name]/Extender: [name] III, [name]: 3 Edema Assessment Assessed: [Left: No] [Right: No] [Left: Edema] [Right: :] Calf Left: Right: Point of Measurement: 34 cm From Medial Instep cm 28 cm Ankle Left: Right: Point of Measurement: 12 cm From Medial Instep cm 21 cm Vascular Assessment Pulses: Dorsalis Pedis Palpable: [Right:Yes] Posterior Tibial Palpable: [Right:No] Extremity  colors, hair growth, and conditions: Temperature of Extremity: [Right:Warm] Capillary Refill: [Right:< 3 seconds] Toe Nail Assessment Left: Right: Thick: No Discolored: No Deformed: No Improper Length and Hygiene: No Electronic Signature(s) Signed: 06/18/2018 2:41:13 PM By: [name] Entered By: [name] on 06/12/2018 08:29:33 [name], [name] ([id number]) -------------------------------------------------------------------------------- Multi [name] Chart Details Patient Name: [name] Date of Service: 06/12/2018 8:00 AM Medical Record Number: [id number] Patient Account Number: [id number] Date of Birth/Sex: [birth year] (86 y.o. M) Treating RN: [name] Primary Care Aurea Aronov: [name] Other Clinician: Referring [name]: [name] Treating  Hanna Aultman/Extender: [name], [name]: 3 Vital Signs Height(in): 68 Pulse(bpm): 66 Weight(lbs): 150 Blood Pressure(mmHg): 143/53 Body Mass Index(BMI): 23 Temperature(F): 97.6 Respiratory Rate 16 (breaths/min): Photos: [1:No Photos] [N/A:N/A] [name] Location: [1:Right Lower Leg - Medial] [N/A:N/A] Wounding Event: [1:Blister] [N/A:N/A] Primary Etiology: [1:Diabetic [name]/Ulcer of the Lower Extremity] [N/A:N/A] Comorbid History: [1:Cataracts, Hypertension, Peripheral Venous Disease, Type II Diabetes, End Stage Renal Disease] [N/A:N/A] Date Acquired: [1:04/19/2018] [N/A:N/A] Weeks of [name]: [1:3] [N/A:N/A] [name] Status: [1:Open] [N/A:N/A] Measurements L x W x D [1:2.5x1x0.1] [N/A:N/A] (cm) Area (cm) : [1:1.963] [N/A:N/A] Volume (cm) : [1:0.196] [N/A:N/A] % Reduction in Area: [1:80.00%] [N/A:N/A] % Reduction in Volume: [1:80.00%] [N/A:N/A] Classification: [1:Grade 2] [N/A:N/A] Exudate Amount: [1:Medium] [N/A:N/A] Exudate Type: [1:Serosanguineous] [N/A:N/A] Exudate Color: [1:red, brown] [N/A:N/A] [name] Margin: [1:Flat and Intact] [N/A:N/A] Granulation Amount: [1:Large (67-100%)]  [N/A:N/A] Granulation Quality: [1:Red] [N/A:N/A] Necrotic Amount: [1:Small (1-33%)] [N/A:N/A] Exposed Structures: [1:Fat Layer (Subcutaneous Tissue) Exposed: Yes Fascia: No Tendon: No Muscle: No Joint: No Bone: No] [N/A:N/A] Epithelialization: [1:Small (1-33%)] [N/A:N/A] Periwound Skin Texture: [1:Scarring: Yes Excoriation: No Induration: No] [N/A:N/A] Callus: No Crepitus: No Rash: No Periwound Skin Moisture: Dry/Scaly: Yes N/A N/A Maceration: No Periwound Skin Color: Hemosiderin Staining: Yes N/A N/A Atrophie Blanche: No Cyanosis: No Ecchymosis: No Erythema: No Mottled: No Pallor: No Rubor: No Temperature: No Abnormality N/A N/A Tenderness on Palpation: Yes N/A N/A [name] Preparation: Ulcer Cleansing: N/A N/A Rinsed/Irrigated with Saline Topical Anesthetic Applied: Other: lidocaine 4% [name] Notes Electronic Signature(s) Signed: 06/12/2018 5:14:43 PM By: [name] Entered By: [name] on 06/12/2018 08:46:09 [name], [name] ([id number]) -------------------------------------------------------------------------------- Multi-Disciplinary Care Plan Details Patient Name: [name] Date of Service: 06/12/2018 8:00 AM Medical Record Number: [id number] Patient Account Number: [id number] Date of Birth/Sex: Jun 02, 1932 (86 y.o. M) Treating RN: [name] Primary Care Reanne Nellums: [name] Other Clinician: Referring Bedie Dominey: [name] Treating Mekhia Brogan/Extender: [name], [name]: 3 Active Inactive Abuse / Safety / Falls / Self Care Management Nursing Diagnoses: Potential for falls Goals: Patient will not experience any injury related to falls Date Initiated: 05/19/2018 Target Resolution Date: 08/15/2018 Goal Status: Active Interventions: Assess fall risk on admission and as needed Notes: Orientation to the [name] Nursing Diagnoses: Knowledge deficit related to the [name] program Goals: Patient/caregiver will  verbalize understanding of the [place] Date Initiated: 05/19/2018 Target Resolution Date: 08/15/2018 Goal Status: Active Interventions: Provide education on orientation to the [name] Notes: Venous Leg Ulcer Nursing Diagnoses: Potential for venous Insuffiency (use before diagnosis confirmed) Goals: Patient will maintain optimal edema control Date Initiated: 05/19/2018 Target Resolution Date: 08/15/2018 Goal Status: Active Interventions: Compression as ordered Kocher, Caden R. ([id number]) Notes: [name]/Skin Impairment Nursing Diagnoses: Impaired tissue integrity Goals: Ulcer/skin breakdown will heal within 14 weeks Date Initiated: 05/19/2018 Target Resolution Date: 08/15/2018 Goal Status: Active Interventions: Assess ulceration(s) every visit Notes: Electronic Signature(s) Signed: 06/12/2018 5:14:43 PM By: [name] Entered By: [name] on 06/12/2018 08:46:03 [name], [name] ([id number]) -------------------------------------------------------------------------------- Pain Assessment Details Patient Name: [name] Date of Service: 06/12/2018 8:00 AM Medical Record Number: [id number] Patient Account Number: [id number] Date of Birth/Sex: 1932-01-21 (86 y.o. M) Treating RN: [name] Primary Care Seraj Dunnam: [name] Other Clinician: Referring Onell Mcmath: [name] Treating Ebba Goll/Extender: [name] III, [name]: 3 Active Problems Location of Pain Severity and Description of Pain Patient Has Paino Yes Site Locations Rate the pain. Current Pain Level: 2 Pain Management and Medication Current Pain Management: Electronic Signature(s) Signed: 06/12/2018 4:39:33 PM  By: [name] [name] Signed: 06/12/2018 5:14:43 PM By: [name] Entered By: [name] on 06/12/2018 08:10:07 [name]  ([id number]) -------------------------------------------------------------------------------- Patient/Caregiver Education Details Patient Name: [name] Date of Service: 06/12/2018 8:00 AM Medical Record Number: [id number] Patient Account Number: [id number] Date of Birth/Gender: 1932-04-13 (86 y.o. M) Treating RN: [name] Primary Care Physician: [name] Other Clinician: Referring Physician: Delight [name] Treating Physician/Extender: Sharalyn Ink in [name]: 3 Education Assessment Education Provided To: Patient and Caregiver Education Topics Provided Venous: Handouts: Other: leg elevation Methods: Explain/Verbal Responses: State content correctly Electronic Signature(s) Signed: 06/12/2018 5:14:43 PM By: [name] Entered By: [name] on 06/12/2018 08:54:55 [name], [name] Kitchen ([id number]) -------------------------------------------------------------------------------- [name] Date of Service: 06/12/2018 8:00 AM Medical Record Number: [id number] Patient Account Number: [id number] Date of Birth/Sex: 1931-06-19 (86 y.o. M) Treating RN: [name] Primary Care Tesslyn Baumert: [name] Other Clinician: Referring Charletha Dalpe: [name] Treating Rainbow Salman/Extender: [name] III, [name]: 3 [name] Status [name] Number: 1 Primary Diabetic [name]/Ulcer of the Lower Extremity Etiology: [name] Location: Right Lower Leg - Medial [name] Open Wounding Event: Blister Status: Date Acquired: 04/19/2018 Comorbid Cataracts, Hypertension, Peripheral Venous Weeks Of [name]: 3 History: Disease, Type II Diabetes, End Stage Renal Clustered [name]: No Disease Photos Photo Uploaded By: [name] on 06/12/2018 10:15:40 [name] Measurements Length: (cm) 2.5 Width: (cm) 1 Depth: (cm) 0.1 Area: (cm) 1.963 Volume: (cm) 0.196 % Reduction in Area: 80% % Reduction in Volume: 80% Epithelialization: Small  (1-33%) Tunneling: No Undermining: No [name] Description Classification: Grade 2 [name] Margin: Flat and Intact Exudate Amount: Medium Exudate Type: Serosanguineous Exudate Color: red, brown Foul Odor After Cleansing: No Slough/Fibrino Yes [name] Bed Granulation Amount: Large (67-100%) Exposed Structure Granulation Quality: Red Fascia Exposed: No Necrotic Amount: Small (1-33%) Fat Layer (Subcutaneous Tissue) Exposed: Yes Necrotic Quality: Adherent Slough Tendon Exposed: No Muscle Exposed: No Joint Exposed: No Bone Exposed: No Periwound Skin Texture [name], [name] R. ([id number]) Texture Color No Abnormalities Noted: No No Abnormalities Noted: No Callus: No Atrophie Blanche: No Crepitus: No Cyanosis: No Excoriation: No Ecchymosis: No Induration: No Erythema: No Rash: No Hemosiderin Staining: Yes Scarring: Yes Mottled: No Pallor: No Moisture Rubor: No No Abnormalities Noted: No Dry / Scaly: Yes Temperature / Pain Maceration: No Temperature: No Abnormality Tenderness on Palpation: Yes [name] Preparation Ulcer Cleansing: Rinsed/Irrigated with Saline Topical Anesthetic Applied: Other: lidocaine 4%, Electronic Signature(s) Signed: 06/18/2018 2:41:13 PM By: [name] Entered By: [name] on 06/12/2018 08:25:31 [name], [name] ([id number]) -------------------------------------------------------------------------------- Vitals Details Patient Name: [name] Date of Service: 06/12/2018 8:00 AM Medical Record Number: [id number] Patient Account Number: [id number] Date of Birth/Sex: [birth year] (86 y.o. M) Treating RN: [name] Primary Care Zoltan Genest: [name] Other Clinician: Referring Lurleen Soltero: [name] Treating Lemya Greenwell/Extender: [name] III, [name]: 3 Vital Signs Time Taken: 08:10 Temperature (F): 97.6 Height (in): 68 Pulse (bpm): 66 Weight (lbs): 150 Respiratory Rate (breaths/min): 16 Body Mass Index (BMI): 22.8 Blood  Pressure (mmHg): 143/53 Reference Range: 80 - 120 mg / dl Electronic Signature(s) Signed: 06/12/2018 4:39:33 PM By: [name] [name] Entered By: [name] on 06/12/2018 08:11:32

## 2018-06-23 ENCOUNTER — Encounter: Payer: Medicare Other | Admitting: Physician Assistant

## 2018-06-23 DIAGNOSIS — E11622 Type 2 diabetes mellitus with other skin ulcer: Secondary | ICD-10-CM | POA: Diagnosis not present

## 2018-06-24 ENCOUNTER — Encounter (INDEPENDENT_AMBULATORY_CARE_PROVIDER_SITE_OTHER): Payer: Self-pay | Admitting: Vascular Surgery

## 2018-06-24 ENCOUNTER — Ambulatory Visit (INDEPENDENT_AMBULATORY_CARE_PROVIDER_SITE_OTHER): Payer: Medicare Other | Admitting: Vascular Surgery

## 2018-06-24 VITALS — BP 148/63 | HR 64 | Resp 16 | Ht 70.0 in | Wt 147.0 lb

## 2018-06-24 DIAGNOSIS — I739 Peripheral vascular disease, unspecified: Secondary | ICD-10-CM | POA: Diagnosis not present

## 2018-06-24 DIAGNOSIS — R600 Localized edema: Secondary | ICD-10-CM | POA: Diagnosis not present

## 2018-06-24 DIAGNOSIS — L97921 Non-pressure chronic ulcer of unspecified part of left lower leg limited to breakdown of skin: Secondary | ICD-10-CM

## 2018-06-24 DIAGNOSIS — L97911 Non-pressure chronic ulcer of unspecified part of right lower leg limited to breakdown of skin: Secondary | ICD-10-CM

## 2018-06-24 NOTE — Progress Notes (Signed)
MYLZ, YUAN ([id number]) Visit Report for 06/23/2018 Chief Complaint Document Details Patient Name: [name], [name] Date of Service: 06/23/2018 10:45 AM Medical Record Number: [id number] Patient Account Number: [id number] Date of Birth/Sex: Jul 20, 1931 (85 y.o. M) Treating RN: Montey Hora Primary Care Provider: Delight Stare Other Clinician: Referring Provider: Delight Stare Treating Provider/Extender: Melburn Hake, HOYT Weeks in Treatment: 5 Information Obtained from: Patient Chief Complaint Right LE Ulcer Electronic Signature(s) Signed: 06/23/2018 5:17:02 PM By: Worthy Keeler PA-C Entered By: Worthy Keeler on 06/23/2018 11:09:17 Vand[place], [name] ([id number]) -------------------------------------------------------------------------------- HPI Details Patient Name: [name] Date of Service: 06/23/2018 10:45 AM Medical Record Number: [id number] Patient Account Number: [id number] Date of Birth/Sex: 08-03-1931 (86 y.o. M) Treating RN: Montey Hora Primary Care Provider: Delight Stare Other Clinician: Referring Provider: Delight Stare Treating Provider/Extender: Melburn Hake, HOYT Weeks in Treatment: 5 History of Present Illness HPI Description: 05/19/18 on evaluation today patient presents for initial evaluation and clinic concerning issues that he has been having with his right medial lower leg ulcer. The patient has actually been seen in the emergency department for cellulitis he was admitted at [facility] on 04/19/18 discharged 04/21/18. He was noted to have proof of vascular disease and did undergo an angiogram on 04/20/18 by Dr. Leotis Pain. 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 Subsequently Dr. dew following intervention noted that the patient had depending on the location 20-40% residual stenosis noted and significant improvement in the overall vascular flow. Obviously  this is great news. With that being said the patient's wound he states does seem to be doing some better compared to previous. Upon inspection indeed it appears that he has some epithelialization. Overall I do not see any evidence of significant infection which is great news. No fevers chills noted 05/26/18 on evaluation today patient's wound actually appears to be doing much better at this time. With that being said I do think the [facility] Dressing a be sticking according to my nurse and this may be causing some issues as well. I'm gonna suggest at this point that we likely add mepitel underneath the [facility] Dressing help prevent this. Other than that everything seems to be doing excellent. 06/02/18 on evaluation today patient actually appears to be doing decently well in regard to his right lower extremity ulcer. This seems to show signs of good improvement which is excellent news. With that being said he did see vascular the good news is no further intervention is recommended nor necessary at this point. Actually placed in an [employer] and sent orders to home health which in the end it led to her switching the patient from the three layer compression wrap of the right lower extremity to an [employer] wrap. I feel like he did better in the three layer compression. With that being said the patient currently shows no signs of worsening the mepitel did help prevent any sticking to the wound bed which is great news. 06/12/18 an evaluation today patient presents for follow-up concerning his right lower extremity ulceration. The medial aspect ulcer appears to be doing fairly well this time which is good news. He has a blister on the lateral aspect although I'm concerned this may actually be due to the fact that his wrap is sliding down. We seem to be having issues with getting this wrapped appropriately were it will stay in place. Fortunately  there does not appear to be any signs of infection  at this time which is good news. He is seen with his son present at this point during the visit. 06/16/18 on evaluation today patient appears to be doing rather well and in fact appears to be almost completely healed in regard to his lower extremity ulcer on the right. The wrap much better this week with [place] wrapping it and just keeping him in that same wrap until follow-up. For that reason we will discontinue home health services. Fortunately there does not appear to be the evidence of infection at this point. 06/23/18 on evaluation today patient appears to be doing very well in regard to his ulcer which in fact appears to be completely healed. He has been tolerating the wraps without complication although he is definitely ready to be out of these considering how well things have done. Fortunately there is not appear to be any signs of infection at this time which is great news. Electronic Signature(s) Signed: 06/23/2018 5:17:02 PM By: Worthy Keeler PA-C Entered By: Worthy Keeler on 06/23/2018 17:03:35 [name], [name] ([id number]) -------------------------------------------------------------------------------- Physical Exam Details Patient Name: [name] Date of Service: 06/23/2018 10:45 AM Medical Record Number: [id number] Patient Account Number: [id number] Date of Birth/Sex: [birth year] (86 y.o. M) Treating RN: Montey Hora Primary Care Provider: Delight Stare Other Clinician: Referring Provider: Delight Stare Treating Provider/Extender: STONE III, HOYT Weeks in Treatment: 5 Constitutional Well-nourished and well-hydrated in no acute distress. Respiratory normal breathing without difficulty. Psychiatric this patient is able to make decisions and demonstrates good insight into disease process. Alert and Oriented x 3. pleasant and cooperative. Notes Patient's wound bed currently shows signs of complete epithelialization which is great news overall he seems to be doing excellent at  this point. Electronic Signature(s) Signed: 06/23/2018 5:17:02 PM By: Worthy Keeler PA-C Entered By: Worthy Keeler on 06/23/2018 17:03:57 [name], [name] ([id number]) -------------------------------------------------------------------------------- Physician Orders Details Patient Name: [name] Date of Service: 06/23/2018 10:45 AM Medical Record Number: [id number] Patient Account Number: [id number] Date of Birth/Sex: 1931/09/07 (86 y.o. M) Treating RN: Montey Hora Primary Care Provider: Delight Stare Other Clinician: Referring Provider: Delight Stare Treating Provider/Extender: Melburn Hake, HOYT Weeks in Treatment: 5 Verbal / Phone Orders: No Diagnosis Coding ICD-10 Coding Code Description E11.622 Type 2 diabetes mellitus with other skin ulcer L97.812 Non-pressure chronic ulcer of other part of right lower leg with fat layer exposed I73.89 Other specified peripheral vascular diseases I10 Essential (primary) hypertension N18.4 Chronic kidney disease, stage 4 (severe) Discharge From [facility] o Discharge from [place] Signature(s) Signed: 06/23/2018 5:17:02 PM By: Worthy Keeler PA-C Signed: 06/23/2018 5:22:36 PM By: Montey Hora Entered By: Montey Hora on 06/23/2018 11:21:34 [name], [name] ([id number]) -------------------------------------------------------------------------------- Problem List Details Patient Name: [name] Date of Service: 06/23/2018 10:45 AM Medical Record Number: [id number] Patient Account Number: [id number] Date of Birth/Sex: [birth year] (86 y.o. M) Treating RN: Montey Hora Primary Care Provider: Delight Stare Other Clinician: Referring Provider: Delight Stare Treating Provider/Extender: Melburn Hake, HOYT Weeks in Treatment: 5 Active Problems ICD-10 Evaluated Encounter Code Description Active Date Today Diagnosis E11.622 Type 2 diabetes mellitus with other skin ulcer 05/19/2018 No Yes L97.812 Non-pressure chronic  ulcer of other part of right lower leg 05/19/2018 No Yes with fat layer exposed I73.89 Other specified peripheral vascular diseases 05/19/2018 No Yes I10 Essential (primary) hypertension 05/19/2018 No Yes N18.4 Chronic kidney disease, stage 4 (severe) 05/19/2018 No Yes Inactive Problems  Resolved Problems Electronic Signature(s) Signed: 06/23/2018 5:17:02 PM By: Worthy Keeler PA-C Entered By: Worthy Keeler on 06/23/2018 11:09:12 [name], [name] ([id number]) -------------------------------------------------------------------------------- Progress Note Details Patient Name: [name] Date of Service: 06/23/2018 10:45 AM Medical Record Number: [id number] Patient Account Number: [id number] Date of Birth/Sex: 1932/01/05 (86 y.o. M) Treating RN: Montey Hora Primary Care Provider: Delight Stare Other Clinician: Referring Provider: Delight Stare Treating Provider/Extender: Melburn Hake, HOYT Weeks in Treatment: 5 Subjective Chief Complaint Information obtained from Patient Right LE Ulcer History of Present Illness (HPI) 05/19/18 on evaluation today patient presents for initial evaluation and clinic concerning issues that he has been having with his right medial lower leg ulcer. The patient has actually been seen in the emergency department for cellulitis he was admitted at [facility] on 04/19/18 discharged 04/21/18. He was noted to have proof of vascular disease and did undergo an angiogram on 04/20/18 by Dr. Leotis Pain. 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 Subsequently Dr. dew following intervention noted that the patient had depending on the location 20-40% residual stenosis noted and significant improvement in the overall vascular flow. Obviously this is great news. With that being said the patient's wound he states does seem to be doing some better compared to previous. Upon  inspection indeed it appears that he has some epithelialization. Overall I do not see any evidence of significant infection which is great news. No fevers chills noted 05/26/18 on evaluation today patient's wound actually appears to be doing much better at this time. With that being said I do think the [facility] Dressing a be sticking according to my nurse and this may be causing some issues as well. I'm gonna suggest at this point that we likely add mepitel underneath the [facility] Dressing help prevent this. Other than that everything seems to be doing excellent. 06/02/18 on evaluation today patient actually appears to be doing decently well in regard to his right lower extremity ulcer. This seems to show signs of good improvement which is excellent news. With that being said he did see vascular the good news is no further intervention is recommended nor necessary at this point. Actually placed in an [employer] and sent orders to home health which in the end it led to her switching the patient from the three layer compression wrap of the right lower extremity to an [employer] wrap. I feel like he did better in the three layer compression. With that being said the patient currently shows no signs of worsening the mepitel did help prevent any sticking to the wound bed which is great news. 06/12/18 an evaluation today patient presents for follow-up concerning his right lower extremity ulceration. The medial aspect ulcer appears to be doing fairly well this time which is good news. He has a blister on the lateral aspect although I'm concerned this may actually be due to the fact that his wrap is sliding down. We seem to be having issues with getting this wrapped appropriately were it will stay in place. Fortunately there does not appear to be any signs of infection at this time which is good news. He is seen with his son present at this point during the visit. 06/16/18 on evaluation today patient  appears to be doing rather well and in fact appears to be almost completely healed in regard to his lower extremity ulcer on  the right. The wrap much better this week with [place] wrapping it and just keeping him in that same wrap until follow-up. For that reason we will discontinue home health services. Fortunately there does not appear to be the evidence of infection at this point. 06/23/18 on evaluation today patient appears to be doing very well in regard to his ulcer which in fact appears to be completely healed. He has been tolerating the wraps without complication although he is definitely ready to be out of these considering how well things have done. Fortunately there is not appear to be any signs of infection at this time which is great news. Patient History Information obtained from Patient. [name], [name] ([id number]) Family History Cancer - Mother, Diabetes - Siblings, Hypertension - Siblings, Stroke - Siblings, No family history of Heart Disease, Hereditary Spherocytosis, Kidney Disease, Lung Disease, Seizures, Thyroid Problems, Tuberculosis. Social History Never smoker, Alcohol Use - Never, Drug Use - No History, Caffeine Use - Rarely. Medical And Surgical History Notes Eyes Detached Retina 1980 Review of Systems (ROS) Constitutional Symptoms (General Health) Denies complaints or symptoms of Fever, Chills. Psychiatric The patient has no complaints or symptoms. Objective Constitutional Well-nourished and well-hydrated in no acute distress. Vitals Time Taken: 10:51 AM, Height: 68 in, Weight: 150 lbs, BMI: 22.8, Temperature: 97.7 F, Pulse: 62 bpm, Respiratory Rate: 16 breaths/min, Blood Pressure: 152/58 mmHg. Respiratory normal breathing without difficulty. Psychiatric this patient is able to make decisions and demonstrates good insight into disease process. Alert and Oriented x 3. pleasant and cooperative. General Notes: Patient's wound bed currently shows signs of complete  epithelialization which is great news overall he seems to be doing excellent at this point. Integumentary (Hair, Skin) Wound #1 status is Healed - Epithelialized. Original cause of wound was Blister. The wound is located on the Right,Medial Lower Leg. The wound measures 0cm length x 0cm width x 0cm depth; 0cm^2 area and 0cm^3 volume. There is Fat Layer (Subcutaneous Tissue) Exposed exposed. There is no tunneling or undermining noted. There is a none present amount of drainage noted. The wound margin is flat and intact. There is no granulation within the wound bed. There is a small (1-33%) amount of necrotic tissue within the wound bed including Eschar. The periwound skin appearance exhibited: Scarring, Dry/Scaly, Hemosiderin Staining. The periwound skin appearance did not exhibit: Callus, Crepitus, Excoriation, Induration, Rash, Maceration, Atrophie Blanche, Cyanosis, Ecchymosis, Mottled, Pallor, Rubor, Erythema. Periwound temperature was noted as No Abnormality. The periwound has tenderness on palpation. [name], [name] ([id number]) Assessment Active Problems ICD-10 Type 2 diabetes mellitus with other skin ulcer Non-pressure chronic ulcer of other part of right lower leg with fat layer exposed Other specified peripheral vascular diseases Essential (primary) hypertension Chronic kidney disease, stage 4 (severe) Plan Discharge From [facility] Services: Discharge from [place] My suggestion currently is gonna be that we discontinue wound care services. The patient is in agreement that plan. Subsequently we're gonna see were things stand at follow-up. If anything changes you let me know his follow-up would just be as needed. I did discuss with the patient's family today what to look for and needs for follow-up. Electronic Signature(s) Signed: 06/23/2018 5:17:02 PM By: Worthy Keeler PA-C Entered By: Worthy Keeler on 06/23/2018 17:04:25 [name], [name]  ([id number]) -------------------------------------------------------------------------------- ROS/PFSH Details Patient Name: [name] Date of Service: 06/23/2018 10:45 AM Medical Record Number: [id number] Patient Account Number: [id number] Date of Birth/Sex: July 13, 1931 (86 y.o. M) Treating RN: Montey Hora Primary Care Provider:  MILES, LINDA Other Clinician: Referring Provider: MILES, LINDA Treating Provider/Extender: STONE III, HOYT Weeks in Treatment: 5 Information Obtained From Patient Wound History Do you currently have one or more open woundso Yes How many open wounds do you currently haveo 1 Approximately how long have you had your woundso 3 months How have you been treating your wound(s) until nowo bandage, Home Health treating Has your wound(s) ever healed and then re-openedo No Have you had any lab work done in the past montho Yes Who ordered the lab work [facility] Have you tested positive for an antibiotic resistant organism (MRSA, VRE)o No Have you tested positive for osteomyelitis (bone infection)o No Have you had any tests for circulation on your legso Yes Where was the test doneo [facility] Have you had other problems associated with your woundso Infection Constitutional Symptoms (General Health) Complaints and Symptoms: Negative for: Fever; Chills Eyes Medical History: Positive for: Cataracts Negative for: Glaucoma; Optic Neuritis Past Medical History Notes: Detached Retina 1980 Hematologic/Lymphatic Medical History: Negative for: Anemia; Hemophilia; Human Immunodeficiency Virus; Lymphedema; Sickle Cell Disease Respiratory Medical History: Negative for: Aspiration; Asthma; Chronic Obstructive Pulmonary Disease (COPD); Pneumothorax; Sleep Apnea; Tuberculosis Cardiovascular Medical History: Positive for: Hypertension - takes medication; Peripheral Venous Disease Gastrointestinal [name], [name] R.  ([id number]) Medical History: Negative for: Cirrhosis ; Colitis; Crohnos; Hepatitis A; Hepatitis B; Hepatitis C Endocrine Medical History: Positive for: Type II Diabetes - 15 years Time with diabetes: 15 years Treated with: Oral agents Blood sugar tested every day: Yes Tested : 205 Genitourinary Medical History: Positive for: End Stage Renal Disease - Stage 4, no dialysis, loses protein Immunological Medical History: Negative for: Lupus Erythematosus; Raynaudos; Scleroderma Integumentary (Skin) Medical History: Negative for: History of Burn; History of pressure wounds Musculoskeletal Medical History: Negative for: Gout; Rheumatoid Arthritis; Osteoarthritis; Osteomyelitis Neurologic Medical History: Negative for: Dementia; Neuropathy; Quadriplegia; Paraplegia; Seizure Disorder Oncologic Medical History: Negative for: Received Chemotherapy; Received Radiation Psychiatric Complaints and Symptoms: No Complaints or Symptoms Medical History: Negative for: Anorexia/bulimia; Confinement Anxiety HBO Extended History Items Eyes: Cataracts Immunizations Pneumococcal Vaccine: Received Pneumococcal Vaccination: Yes Implantable Devices [name], [name] ([id number]) Family and Social History Cancer: Yes - Mother; Diabetes: Yes - Siblings; Heart Disease: No; Hereditary Spherocytosis: No; Hypertension: Yes - Siblings; Kidney Disease: No; Lung Disease: No; Seizures: No; Stroke: Yes - Siblings; Thyroid Problems: No; Tuberculosis: No; Never smoker; Alcohol Use: Never; Drug Use: No History; Caffeine Use: Rarely; Financial Concerns: No; Food, Clothing or Shelter Needs: No; Support System Lacking: No; Transportation Concerns: No; Advanced Directives: Yes (Not Provided); Patient does not want information on Advanced Directives; Living Will: Yes (Not Provided) Physician Affirmation I have reviewed and agree with the above information. Electronic Signature(s) Signed: 06/23/2018 5:17:02 PM By:  Worthy Keeler PA-C Signed: 06/23/2018 5:22:36 PM By: Montey Hora Entered By: Worthy Keeler on 06/23/2018 17:03:45 [name], [name] ([id number]) -------------------------------------------------------------------------------- SuperBill Details Patient Name: [name] Date of Service: 06/23/2018 Medical Record Number: [id number] Patient Account Number: [id number] Date of Birth/Sex: 08/02/1931 (86 y.o. M) Treating RN: Montey Hora Primary Care Provider: Delight Stare Other Clinician: Referring Provider: Delight Stare Treating Provider/Extender: Melburn Hake, HOYT Weeks in Treatment: 5 Diagnosis Coding ICD-10 Codes Code Description E11.622 Type 2 diabetes mellitus with other skin ulcer L97.812 Non-pressure chronic ulcer of other part of right lower leg with fat layer exposed I73.89 Other specified peripheral vascular diseases I10 Essential (primary) hypertension N18.4 Chronic kidney disease, stage 4 (severe) Facility Procedures CPT4 Code: [id number] Description:  12224 - WOUND CARE VISIT-LEV 3 EST PT Modifier: Quantity: 1 Physician Procedures CPT4 Code Description: [id number] [id number] - WC PHYS LEVEL 2 - EST PT ICD-10 Diagnosis Description E11.622 Type 2 diabetes mellitus with other skin ulcer L97.812 Non-pressure chronic ulcer of other part of right lower leg wi I73.89 Other specified peripheral  vascular diseases I10 Essential (primary) hypertension Modifier: th fat layer expo Quantity: 1 sed Electronic Signature(s) Signed: 06/23/2018 5:17:02 PM By: Worthy Keeler PA-C Entered By: Worthy Keeler on 06/23/2018 17:04:42

## 2018-06-25 ENCOUNTER — Telehealth (INDEPENDENT_AMBULATORY_CARE_PROVIDER_SITE_OTHER): Payer: Self-pay | Admitting: Vascular Surgery

## 2018-06-25 NOTE — Telephone Encounter (Signed)
I have called and given them verbal orders, Mickel Baas, could you fax them orders for a weekly unna boot changes. Thanks.

## 2018-06-25 NOTE — Telephone Encounter (Signed)
Abby from [facility] left vm requesting verbal and written orders for pt for unna boots for 1 month. Written orders to be faxed to [phone]. For verbal she provided her call back as [phone]

## 2018-06-25 NOTE — Telephone Encounter (Signed)
I will not be in the office until Monday. Another provider should be able to help you with this.

## 2018-06-26 NOTE — Telephone Encounter (Signed)
The written prescription will be faxed to [facility] attn Abby at [phone].

## 2018-06-26 NOTE — Progress Notes (Signed)
[name], [name] ([id number]) Visit Report for 06/23/2018 Arrival Information Details Patient Name: [name], [name] Date of Service: 06/23/2018 10:45 AM Medical Record Number: [id number] Patient Account Number: [id number] Date of Birth/Sex: 10-24-1931 (85 y.o. M) Treating RN: Montey Hora Primary Care Chesney Klimaszewski: Delight Stare Other Clinician: Referring Viggo Perko: Delight Stare Treating Xiana Carns/Extender: Melburn Hake, HOYT Weeks in Treatment: 5 Visit Information History Since Last Visit Added or deleted any medications: No Patient Arrived: Ambulatory Any new allergies or adverse reactions: No Arrival Time: 10:50 Had a fall or experienced change in No Accompanied By: daughter activities of daily living that may affect Transfer Assistance: None risk of falls: Patient Identification Verified: Yes Signs or symptoms of abuse/neglect since last visito No Secondary Verification Process Completed: Yes Hospitalized since last visit: No Implantable device outside of the clinic excluding No cellular tissue based products placed in the center since last visit: Has Dressing in Place as Prescribed: Yes Pain Present Now: No Electronic Signature(s) Signed: 06/23/2018 1:47:13 PM By: Lorine Bears RCP, RRT, CHT Entered By: Lorine Bears on 06/23/2018 10:50:48 Divito, [name] ([id number]) -------------------------------------------------------------------------------- Clinic Level of Care Assessment Details Patient Name: [name] Date of Service: 06/23/2018 10:45 AM Medical Record Number: [id number] Patient Account Number: [id number] Date of Birth/Sex: [birth year] (85 y.o. M) Treating RN: Montey Hora Primary Care Quentina Fronek: Delight Stare Other Clinician: Referring Kristofer Schaffert: Delight Stare Treating Lacresia Darwish/Extender: STONE III, HOYT Weeks in Treatment: 5 Clinic Level of Care Assessment Items TOOL 4 Quantity Score []  - Use when only an EandM is performed on FOLLOW-UP visit  0 ASSESSMENTS - Nursing Assessment / Reassessment X - Reassessment of Co-morbidities (includes updates in patient status) 1 10 X- 1 5 Reassessment of Adherence to Treatment Plan ASSESSMENTS - Wound and Skin Assessment / Reassessment X - Simple Wound Assessment / Reassessment - one wound 1 5 []  - 0 Complex Wound Assessment / Reassessment - multiple wounds []  - 0 Dermatologic / Skin Assessment (not related to wound area) ASSESSMENTS - Focused Assessment X - Circumferential Edema Measurements - multi extremities 1 5 []  - 0 Nutritional Assessment / Counseling / Intervention X- 1 5 Lower Extremity Assessment (monofilament, tuning fork, pulses) []  - 0 Peripheral Arterial Disease Assessment (using hand held doppler) ASSESSMENTS - Ostomy and/or Continence Assessment and Care []  - Incontinence Assessment and Management 0 []  - 0 Ostomy Care Assessment and Management (repouching, etc.) PROCESS - Coordination of Care X - Simple Patient / Family Education for ongoing care 1 15 []  - 0 Complex (extensive) Patient / Family Education for ongoing care X- 1 10 Staff obtains Programmer, systems, Records, Test Results / Process Orders []  - 0 Staff telephones HHA, Nursing Homes / Clarify orders / etc []  - 0 Routine Transfer to another Facility (non-emergent condition) []  - 0 Routine Hospital Admission (non-emergent condition) []  - 0 New Admissions / Biomedical engineer / Ordering NPWT, Apligraf, etc. []  - 0 Emergency Hospital Admission (emergent condition) X- 1 10 Simple Discharge Coordination Najarro, Gedalia R. ([id number]) []  - 0 Complex (extensive) Discharge Coordination PROCESS - Special Needs []  - Pediatric / Minor Patient Management 0 []  - 0 Isolation Patient Management []  - 0 Hearing / Language / Visual special needs []  - 0 Assessment of Community assistance (transportation, D/C planning, etc.) []  - 0 Additional assistance / Altered mentation []  - 0 Support Surface(s) Assessment (bed,  cushion, seat, etc.) INTERVENTIONS - Wound Cleansing / Measurement X - Simple Wound Cleansing - one wound 1 5 []  - 0 Complex Wound Cleansing - multiple  wounds X- 1 5 Wound Imaging (photographs - any number of wounds) []  - 0 Wound Tracing (instead of photographs) X- 1 5 Simple Wound Measurement - one wound []  - 0 Complex Wound Measurement - multiple wounds INTERVENTIONS - Wound Dressings []  - Small Wound Dressing one or multiple wounds 0 []  - 0 Medium Wound Dressing one or multiple wounds []  - 0 Large Wound Dressing one or multiple wounds []  - 0 Application of Medications - topical []  - 0 Application of Medications - injection INTERVENTIONS - Miscellaneous []  - External ear exam 0 []  - 0 Specimen Collection (cultures, biopsies, blood, body fluids, etc.) []  - 0 Specimen(s) / Culture(s) sent or taken to Lab for analysis []  - 0 Patient Transfer (multiple staff / Civil Service fast streamer / Similar devices) []  - 0 Simple Staple / Suture removal (25 or less) []  - 0 Complex Staple / Suture removal (26 or more) []  - 0 Hypo / Hyperglycemic Management (close monitor of Blood Glucose) []  - 0 Ankle / Brachial Index (ABI) - do not check if billed separately X- 1 5 Vital Signs Fulcher, Naeem R. ([id number]) Has the patient been seen at the hospital within the last three years: Yes Total Score: 85 Level Of Care: New/Established - Level 3 Electronic Signature(s) Signed: 06/23/2018 5:22:36 PM By: Montey Hora Entered By: Montey Hora on 06/23/2018 11:57:41 Goede, [name] ([id number]) -------------------------------------------------------------------------------- Encounter Discharge Information Details Patient Name: [name] Date of Service: 06/23/2018 10:45 AM Medical Record Number: [id number] Patient Account Number: [id number] Date of Birth/Sex: 1932/03/18 (85 y.o. M) Treating RN: Montey Hora Primary Care Wasif Simonich: Delight Stare Other Clinician: Referring Barbara Keng: Delight Stare Treating  Zainab Crumrine/Extender: Melburn Hake, HOYT Weeks in Treatment: 5 Encounter Discharge Information Items Discharge Condition: Stable Ambulatory Status: Cane Discharge Destination: Home Transportation: Private Auto Accompanied By: dtr Schedule Follow-up Appointment: No Clinical Summary of Care: Electronic Signature(s) Signed: 06/23/2018 11:58:00 AM By: Montey Hora Entered By: Montey Hora on 06/23/2018 11:57:59 Betterton, [name] ([id number]) -------------------------------------------------------------------------------- Lower Extremity Assessment Details Patient Name: [name] Date of Service: 06/23/2018 10:45 AM Medical Record Number: [id number] Patient Account Number: [id number] Date of Birth/Sex: Sep 08, 1931 (85 y.o. M) Treating RN: Harold Barban Primary Care Alaiya Martindelcampo: Delight Stare Other Clinician: Referring Kamariah Fruchter: Delight Stare Treating Storm Sovine/Extender: Melburn Hake, HOYT Weeks in Treatment: 5 Electronic Signature(s) Signed: 06/25/2018 4:21:01 PM By: Harold Barban Entered By: Harold Barban on 06/23/2018 11:08:00 Minckler, [name] ([id number]) -------------------------------------------------------------------------------- Multi Wound Chart Details Patient Name: [name] Date of Service: 06/23/2018 10:45 AM Medical Record Number: [id number] Patient Account Number: [id number] Date of Birth/Sex: 1932/01/17 (85 y.o. M) Treating RN: Montey Hora Primary Care Elvena Oyer: Delight Stare Other Clinician: Referring Brad Mcgaughy: Delight Stare Treating Krystian Younglove/Extender: STONE III, HOYT Weeks in Treatment: 5 Vital Signs Height(in): 68 Pulse(bpm): 84 Weight(lbs): 150 Blood Pressure(mmHg): 152/58 Body Mass Index(BMI): 23 Temperature(F): 97.7 Respiratory Rate 16 (breaths/min): Photos: [1:No Photos] [N/A:N/A] Wound Location: [1:Right Lower Leg - Medial] [N/A:N/A] Wounding Event: [1:Blister] [N/A:N/A] Primary Etiology: [1:Diabetic Wound/Ulcer of the Lower Extremity]  [N/A:N/A] Comorbid History: [1:Cataracts, Hypertension, Peripheral Venous Disease, Type II Diabetes, End Stage Renal Disease] [N/A:N/A] Date Acquired: [1:04/19/2018] [N/A:N/A] Weeks of Treatment: [1:5] [N/A:N/A] Wound Status: [1:Open] [N/A:N/A] Measurements L x W x D [1:0.1x0.1x0.1] [N/A:N/A] (cm) Area (cm) : [1:0.008] [N/A:N/A] Volume (cm) : [1:0.001] [N/A:N/A] % Reduction in Area: [1:99.90%] [N/A:N/A] % Reduction in Volume: [1:99.90%] [N/A:N/A] Classification: [1:Grade 2] [N/A:N/A] Exudate Amount: [1:None Present] [N/A:N/A] Wound Margin: [1:Flat and Intact] [N/A:N/A] Granulation Amount: [1:None Present (0%)] [N/A:N/A] Necrotic Amount: [1:Small (1-33%)] [  N/A:N/A] Necrotic Tissue: [1:Eschar] [N/A:N/A] Exposed Structures: [1:Fat Layer (Subcutaneous Tissue) Exposed: Yes Fascia: No Tendon: No Muscle: No Joint: No Bone: No] [N/A:N/A] Epithelialization: [1:Small (1-33%)] [N/A:N/A] Periwound Skin Texture: [1:Scarring: Yes Excoriation: No Induration: No Callus: No] [N/A:N/A] Crepitus: No Rash: No Periwound Skin Moisture: Dry/Scaly: Yes N/A N/A Maceration: No Periwound Skin Color: Hemosiderin Staining: Yes N/A N/A Atrophie Blanche: No Cyanosis: No Ecchymosis: No Erythema: No Mottled: No Pallor: No Rubor: No Temperature: No Abnormality N/A N/A Tenderness on Palpation: Yes N/A N/A Wound Preparation: Ulcer Cleansing: N/A N/A Rinsed/Irrigated with Saline Topical Anesthetic Applied: Other: lidocaine 4% Treatment Notes Electronic Signature(s) Signed: 06/23/2018 5:22:36 PM By: Montey Hora Entered By: Montey Hora on 06/23/2018 11:14:49 SIRRON, FRANCESCONI ([id number]) -------------------------------------------------------------------------------- Multi-Disciplinary Care Plan Details Patient Name: [name] Date of Service: 06/23/2018 10:45 AM Medical Record Number: [id number] Patient Account Number: [id number] Date of Birth/Sex: 01-Jan-1932 (85 y.o. M) Treating RN: Montey Hora Primary Care Jossiah Smoak: Delight Stare Other Clinician: Referring Lakiesha Ralphs: Delight Stare Treating Shawntelle Ungar/Extender: Melburn Hake, HOYT Weeks in Treatment: 5 Active Inactive Electronic Signature(s) Signed: 06/23/2018 5:22:36 PM By: Montey Hora Entered By: Montey Hora on 06/23/2018 11:21:10 Krupinski, [name] ([id number]) -------------------------------------------------------------------------------- Pain Assessment Details Patient Name: [name] Date of Service: 06/23/2018 10:45 AM Medical Record Number: [id number] Patient Account Number: [id number] Date of Birth/Sex: 1932-04-19 (85 y.o. M) Treating RN: Montey Hora Primary Care Brittney Mucha: Delight Stare Other Clinician: Referring Randie Tallarico: Delight Stare Treating Victoriano Campion/Extender: STONE III, HOYT Weeks in Treatment: 5 Active Problems Location of Pain Severity and Description of Pain Patient Has Paino No Site Locations Pain Management and Medication Current Pain Management: Electronic Signature(s) Signed: 06/23/2018 1:47:13 PM By: Paulla Fore, RRT, CHT Signed: 06/23/2018 5:22:36 PM By: Montey Hora Entered By: Lorine Bears on 06/23/2018 10:50:56 Sauseda, [name] ([id number]) -------------------------------------------------------------------------------- Patient/Caregiver Education Details Patient Name: [name] Date of Service: 06/23/2018 10:45 AM Medical Record Number: [id number] Patient Account Number: [id number] Date of Birth/Gender: [birth year] (85 y.o. M) Treating RN: Montey Hora Primary Care Physician: Delight Stare Other Clinician: Referring Physician: Delight Stare Treating Physician/Extender: Sharalyn Ink in Treatment: 5 Education Assessment Education Provided To: Patient and Caregiver Education Topics Provided Basic Hygiene: Handouts: Other: care of newly healed ulcer site Methods: Explain/Verbal Responses: State content correctly Electronic  Signature(s) Signed: 06/23/2018 5:22:36 PM By: Montey Hora Entered By: Montey Hora on 06/23/2018 11:58:18 Stroope, [name] ([id number]) -------------------------------------------------------------------------------- Wound Assessment Details Patient Name: [name] Date of Service: 06/23/2018 10:45 AM Medical Record Number: [id number] Patient Account Number: [id number] Date of Birth/Sex: September 04, 1931 (85 y.o. M) Treating RN: Montey Hora Primary Care Kawana Hegel: Delight Stare Other Clinician: Referring Sha Burling: Delight Stare Treating Gemini Beaumier/Extender: STONE III, HOYT Weeks in Treatment: 5 Wound Status Wound Number: 1 Primary Diabetic Wound/Ulcer of the Lower Extremity Etiology: Wound Location: Right, Medial Lower Leg Wound Healed - Epithelialized Wounding Event: Blister Status: Date Acquired: 04/19/2018 Comorbid Cataracts, Hypertension, Peripheral Venous Weeks Of Treatment: 5 History: Disease, Type II Diabetes, End Stage Renal Clustered Wound: No Disease Photos Photo Uploaded By: Harold Barban on 06/23/2018 13:22:06 Wound Measurements Length: (cm) 0 % Re Width: (cm) 0 % Re Depth: (cm) 0 Epit Area: (cm) 0 Tun Volume: (cm) 0 Und duction in Area: 100% duction in Volume: 100% helialization: Small (1-33%) neling: No ermining: No Wound Description Classification: Grade 2 Wound Margin: Flat and Intact Exudate Amount: None Present Foul Odor After Cleansing: No Slough/Fibrino No Wound Bed Granulation Amount: None Present (0%) Exposed Structure Necrotic Amount: Small (1-33%)  Fascia Exposed: No Necrotic Quality: Eschar Fat Layer (Subcutaneous Tissue) Exposed: Yes Tendon Exposed: No Muscle Exposed: No Joint Exposed: No Bone Exposed: No Periwound Skin Texture Texture Color No Abnormalities Noted: No No Abnormalities Noted: No Ludwick, Burak R. ([id number]) Callus: No Atrophie Blanche: No Crepitus: No Cyanosis: No Excoriation: No Ecchymosis: No Induration:  No Erythema: No Rash: No Hemosiderin Staining: Yes Scarring: Yes Mottled: No Pallor: No Moisture Rubor: No No Abnormalities Noted: No Dry / Scaly: Yes Temperature / Pain Maceration: No Temperature: No Abnormality Tenderness on Palpation: Yes Wound Preparation Ulcer Cleansing: Rinsed/Irrigated with Saline Topical Anesthetic Applied: Other: lidocaine 4%, Electronic Signature(s) Signed: 06/23/2018 5:22:36 PM By: Montey Hora Entered By: Montey Hora on 06/23/2018 11:19:00 SAN, LOHMEYER ([id number]) -------------------------------------------------------------------------------- Vitals Details Patient Name: [name] Date of Service: 06/23/2018 10:45 AM Medical Record Number: [id number] Patient Account Number: [id number] Date of Birth/Sex: 06/21/1931 (85 y.o. M) Treating RN: Montey Hora Primary Care Jadore Veals: Delight Stare Other Clinician: Referring Shukri Nistler: Delight Stare Treating Maghen Group/Extender: STONE III, HOYT Weeks in Treatment: 5 Vital Signs Time Taken: 10:51 Temperature (F): 97.7 Height (in): 68 Pulse (bpm): 62 Weight (lbs): 150 Respiratory Rate (breaths/min): 16 Body Mass Index (BMI): 22.8 Blood Pressure (mmHg): 152/58 Reference Range: 80 - 120 mg / dl Electronic Signature(s) Signed: 06/23/2018 1:47:13 PM By: Lorine Bears RCP, RRT, CHT Entered By: Lorine Bears on 06/23/2018 10:54:04

## 2018-06-29 ENCOUNTER — Encounter (INDEPENDENT_AMBULATORY_CARE_PROVIDER_SITE_OTHER): Payer: Self-pay | Admitting: Vascular Surgery

## 2018-06-29 DIAGNOSIS — L97911 Non-pressure chronic ulcer of unspecified part of right lower leg limited to breakdown of skin: Secondary | ICD-10-CM | POA: Insufficient documentation

## 2018-06-29 DIAGNOSIS — L97921 Non-pressure chronic ulcer of unspecified part of left lower leg limited to breakdown of skin: Secondary | ICD-10-CM

## 2018-06-29 NOTE — Progress Notes (Signed)
Subjective:    Patient ID: [name], male    DOB: [birth year], 85 y.o.   MRN: [id number] Chief Complaint  Patient presents with  . Follow-up    pt is not currently wearing any unna boots due to wound center no longer doing them   Patient last seen in our office on May 27, 2018.  At that time, the patient was placed in three layer zinc oxide unna wraps to the right lower extremity for edema with ulceration.  Patient seen with his daughter.  Daughter notes that the patient was recently discharged from the [facility] wound centers care and was told not to continue being treated with unna wraps.  The patient is currently receiving home care by [facility] for weekly unna wraps.  The patient presents today with worsening edema to the bilateral lower extremity.  The patient also has blister formation to the bilateral feet.  Bilateral ABI conducted on May 27, 2018 were notable for noncompressible arteries however there are normal great toe pressures bilaterally.  Daughter and patient are concerned about being discharged from the [facility] wound centers care, unna wrap therapy being discontinued and the patient having continued ulcers to the bilateral feet and progressively worsening swelling.  Patient denies any erythema to the bilateral legs.  Patient denies any fever, nausea vomiting.  Review of Systems  Constitutional: Negative.   HENT: Negative.   Eyes: Negative.   Respiratory: Negative.   Cardiovascular: Positive for leg swelling.  Gastrointestinal: Negative.   Endocrine: Negative.   Genitourinary: Negative.   Musculoskeletal: Negative.   Skin: Positive for wound.  Allergic/Immunologic: Negative.   Neurological: Negative.   Hematological: Negative.   Psychiatric/Behavioral: Negative.       Objective:   Physical Exam Vitals signs reviewed.  Constitutional:      Appearance: Normal appearance.  HENT:     Head: Normocephalic and atraumatic.     Right Ear: External ear normal.   Left Ear: External ear normal.     Nose: Nose normal.     Mouth/Throat:     Mouth: Mucous membranes are dry.     Pharynx: Oropharynx is clear.  Eyes:     Extraocular Movements: Extraocular movements intact.     Conjunctiva/sclera: Conjunctivae normal.     Pupils: Pupils are equal, round, and reactive to light.  Neck:     Musculoskeletal: Normal range of motion.  Cardiovascular:     Rate and Rhythm: Normal rate and regular rhythm.  Pulmonary:     Effort: Pulmonary effort is normal.     Breath sounds: Normal breath sounds.  Musculoskeletal: Normal range of motion.        General: Swelling (Mild to moderate edema noted to the bilateral feet.  Mild nonpitting edema noted to the bilateral calves.  No cellulitis noted.) present.  Skin:    Comments: Small shallow scattered ulcerations to the bilateral feet.  Ulceration noted to the right medial calf is almost healed.  Neurological:     General: No focal deficit present.     Mental Status: He is alert and oriented to person, place, and time.  Psychiatric:        Mood and Affect: Mood normal.        Behavior: Behavior normal.        Thought Content: Thought content normal.        Judgment: Judgment normal.    BP (!) 148/63 (BP Location: Right Arm, Patient Position: Sitting, Cuff Size: Normal)   Pulse  64   Resp 16   Ht 5\' 10"  (1.778 m)   Wt 147 lb (66.7 kg)   BMI 21.09 kg/m   Past Medical History:  Diagnosis Date  . Cancer ([place])   . Chronic kidney disease   . Colon polyp   . Diabetes mellitus without complication ([place])   . Heart murmur 2008  . Hyperlipidemia   . Hypertension 1980  . Retinal detachment    Social History   Socioeconomic History  . Marital status: Widowed    Spouse name: Not on file  . Number of children: Not on file  . Years of education: Not on file  . Highest education level: Not on file  Occupational History  . Not on file  Social Needs  . Financial resource strain: Not on file  . Food insecurity:      Worry: Not on file    Inability: Not on file  . Transportation needs:    Medical: Not on file    Non-medical: Not on file  Tobacco Use  . Smoking status: Never Smoker  . Smokeless tobacco: Never Used  Substance and Sexual Activity  . Alcohol use: No  . Drug use: No  . Sexual activity: Not on file  Lifestyle  . Physical activity:    Days per week: Not on file    Minutes per session: Not on file  . Stress: Not on file  Relationships  . Social connections:    Talks on phone: Not on file    Gets together: Not on file    Attends religious service: Not on file    Active member of club or organization: Not on file    Attends meetings of clubs or organizations: Not on file    Relationship status: Not on file  . Intimate partner violence:    Fear of current or ex partner: Not on file    Emotionally abused: Not on file    Physically abused: Not on file    Forced sexual activity: Not on file  Other Topics Concern  . Not on file  Social History Narrative  . Not on file   Past Surgical History:  Procedure Laterality Date  . COLONOSCOPY  2011,06/23/13   Dr Bary Castilla  . COLONOSCOPY  06-28-2011  . COLONOSCOPY WITH PROPOFOL N/A 08/30/2015   Procedure: COLONOSCOPY WITH PROPOFOL;  Surgeon: [name], MD;  Location: [facility] ENDOSCOPY;  Service: Endoscopy;  Laterality: N/A;  . EYE SURGERY    . HERNIA REPAIR    . LOWER EXTREMITY ANGIOGRAPHY Right 04/20/2018   Procedure: Lower Extremity Angiography;  Surgeon: Algernon Huxley, MD;  Location: [place] CV LAB;  Service: Cardiovascular;  Laterality: Right;  . POLYPECTOMY  06-23-13  . ]     No family history on file.  Allergies  Allergen Reactions  . Meperidine Other (See Comments)    Severe bradycardia Severe bradycardia   . Penicillins Rash      Assessment & Plan:  Patient last seen in our office on May 27, 2018.  At that time, the patient was placed in three layer zinc oxide unna wraps to the right lower extremity for  edema with ulceration.  Patient seen with his daughter.  Daughter notes that the patient was recently discharged from the [facility] wound centers care and was told not to continue being treated with unna wraps.  The patient is currently receiving home care by [facility] for weekly unna wraps.  The patient presents today with worsening  edema to the bilateral lower extremity.  The patient also has blister formation to the bilateral feet.  Bilateral ABI conducted on May 27, 2018 were notable for noncompressible arteries however there are normal great toe pressures bilaterally.  Daughter and patient are concerned about being discharged from the [facility] wound centers care, unna wrap therapy being discontinued and the patient having continued ulcers to the bilateral feet and progressively worsening swelling.  Patient denies any erythema to the bilateral legs.  Patient denies any fever, nausea vomiting.  1. Bilateral lower extremity edema - New The patient presents today with progressively worsening bilateral lower extremity edema especially to the bilateral feet. There are small shallow uninfected ulcerations noted to the top of the bilateral feet Recommend continued 3 layer zinc oxide Unna boot therapy until edema is controlled and ulcerations have completely healed. Recommend weekly 3 layer zinc oxide Unna wraps which were placed today bilaterally [facility] nurse services I will see the patient back in 1 month to assess his progress with Unna boot therapy. We had a long discussion about proper elevation and remaining active Once control of the patient's edema is achieved and his ulcers have healed I will transition him into medical grade 1 compression socks When the patient follows up in 1 month I will have him undergo a bilateral lower extremity venous duplex to rule out any contributing venous disease Patient may benefit from a lymphedema pump The patient and  his daughter are in agreement  - VAS [place] LOWER EXTREMITY VENOUS REFLUX; Future  2. Peripheral arterial disease (HCC) - Stable Most recent ABI with essentially triphasic tibials and normal big toe pressures bilaterally.  3. Ulcers of both lower legs, limited to breakdown of skin (HCC) - New As above  - VAS [place] LOWER EXTREMITY VENOUS REFLUX; Future  Current Outpatient Medications on File Prior to Visit  Medication Sig Dispense Refill  . amLODipine (NORVASC) 10 MG tablet Take 10 mg by mouth daily.     Marland Kitchen aspirin EC 81 MG EC tablet Take 1 tablet (81 mg total) by mouth daily. 30 tablet 0  . atorvastatin (LIPITOR) 10 MG tablet Take 1 tablet (10 mg total) by mouth daily at 6 PM. 30 tablet 0  . cloNIDine (CATAPRES - DOSED IN MG/24 HR) 0.2 mg/24hr patch Place 0.2 mg onto the skin every Sunday.     . clopidogrel (PLAVIX) 75 MG tablet Take 1 tablet (75 mg total) by mouth daily. 300 tablet 0  . Ferrous Sulfate (IRON) 28 MG TABS Take 1 tablet by mouth.    . furosemide (LASIX) 40 MG tablet Take 40 mg by mouth daily. Taking 40mg  each am and 20mg  each evening.    Marland Kitchen GLIPIZIDE XL 2.5 MG 24 hr tablet Take 2.5 mg by mouth daily.     Marland Kitchen JALYN 0.5-0.4 MG CAPS Take 1 capsule by mouth daily.     Marland Kitchen labetalol (NORMODYNE) 200 MG tablet Take 100 mg by mouth daily.    Marland Kitchen linagliptin (TRADJENTA) 5 MG TABS tablet Take 5 mg by mouth daily.    Marland Kitchen loperamide (IMODIUM) 2 MG capsule Take 2 mg by mouth as needed for diarrhea or loose stools.    . Multiple Vitamin (MULTIVITAMIN) capsule Take 1 capsule by mouth daily.     No current facility-administered medications on file prior to visit.     There are no Patient Instructions on file for this visit. No follow-ups on file.   Elizet Kaplan A  Rahiem Schellinger, PA-C

## 2018-07-17 ENCOUNTER — Telehealth (INDEPENDENT_AMBULATORY_CARE_PROVIDER_SITE_OTHER): Payer: Self-pay

## 2018-07-17 NOTE — Telephone Encounter (Signed)
Abby from [place] called stating the patient will be discharged today from homehealth. The patient is having bilateral unna boots placed but does not have open wounds any longer so he will be discharged after today's visit. I asked that she have the patient to call our office to make an appt for unna boot change for next week. The patient has a f/u appt on 07/27/2018 with ultrasound and see the PA [facility].

## 2018-07-23 ENCOUNTER — Encounter (INDEPENDENT_AMBULATORY_CARE_PROVIDER_SITE_OTHER): Payer: Self-pay | Admitting: Nurse Practitioner

## 2018-07-23 ENCOUNTER — Ambulatory Visit (INDEPENDENT_AMBULATORY_CARE_PROVIDER_SITE_OTHER): Payer: Medicare Other | Admitting: Nurse Practitioner

## 2018-07-23 VITALS — BP 173/67 | HR 63 | Resp 14 | Ht 70.0 in | Wt 148.6 lb

## 2018-07-23 DIAGNOSIS — L97921 Non-pressure chronic ulcer of unspecified part of left lower leg limited to breakdown of skin: Secondary | ICD-10-CM

## 2018-07-23 DIAGNOSIS — L97911 Non-pressure chronic ulcer of unspecified part of right lower leg limited to breakdown of skin: Secondary | ICD-10-CM | POA: Diagnosis not present

## 2018-07-23 NOTE — Progress Notes (Signed)
History of Present Illness  There is no documented history at this time  Assessments & Plan   There are no diagnoses linked to this encounter.    Additional instructions  Subjective:  Patient presents with venous ulcer of the Bilateral lower extremity.    Procedure:  3 layer unna wrap was placed Bilateral lower extremity.   Plan:   Follow up in one week.  

## 2018-07-27 ENCOUNTER — Encounter (INDEPENDENT_AMBULATORY_CARE_PROVIDER_SITE_OTHER): Payer: Self-pay | Admitting: Vascular Surgery

## 2018-07-27 ENCOUNTER — Ambulatory Visit (INDEPENDENT_AMBULATORY_CARE_PROVIDER_SITE_OTHER): Payer: Medicare Other | Admitting: Vascular Surgery

## 2018-07-27 ENCOUNTER — Ambulatory Visit (INDEPENDENT_AMBULATORY_CARE_PROVIDER_SITE_OTHER): Payer: Medicare Other

## 2018-07-27 VITALS — BP 150/61 | HR 60 | Resp 16 | Ht 68.5 in | Wt 150.0 lb

## 2018-07-27 DIAGNOSIS — R600 Localized edema: Secondary | ICD-10-CM | POA: Diagnosis not present

## 2018-07-27 DIAGNOSIS — I890 Lymphedema, not elsewhere classified: Secondary | ICD-10-CM | POA: Diagnosis not present

## 2018-07-27 DIAGNOSIS — L97921 Non-pressure chronic ulcer of unspecified part of left lower leg limited to breakdown of skin: Secondary | ICD-10-CM

## 2018-07-27 DIAGNOSIS — I872 Venous insufficiency (chronic) (peripheral): Secondary | ICD-10-CM | POA: Diagnosis not present

## 2018-07-27 DIAGNOSIS — L97911 Non-pressure chronic ulcer of unspecified part of right lower leg limited to breakdown of skin: Secondary | ICD-10-CM | POA: Diagnosis not present

## 2018-07-27 NOTE — Progress Notes (Signed)
Subjective:    Patient ID: [name], male    DOB: 07/12/1931, 85 y.o.   MRN: [id number] Chief Complaint  Patient presents with  . Follow-up    ultrasound follow up   Patient presents for a monthly bilateral lower extremity lymphedema exacerbation / unna boot therapy follow-up.  Over the last 4 weeks, the patient has been undergoing 3 layer zinc oxide and wraps to the bilateral lower extremity for lymphedema exacerbation with ulcer formation.  The patient was seen with his daughter.  The patient notes a market improvement in his edema and his ulcerations have healed.  The patient has purchased himself a pair of medical grade 1 compression socks.  The patient also underwent a bilateral lower extremity venous duplex which was notable for: Right: Chronic venous insufficiency noted in the proximal small saphenous vein. Left: Chronic venous insufficiency noted in the proximal small saphenous vein and in the popliteal vein. No evidence of deep vein or superficial venous thrombosis noted bilaterally. Patient denies any fever, nausea vomiting.  Review of Systems  Constitutional: Negative.   HENT: Negative.   Eyes: Negative.   Respiratory: Negative.   Cardiovascular: Positive for leg swelling.  Gastrointestinal: Negative.   Endocrine: Negative.   Genitourinary: Negative.   Musculoskeletal: Negative.   Skin: Negative.   Allergic/Immunologic: Negative.   Neurological: Negative.   Hematological: Negative.   Psychiatric/Behavioral: Negative.       Objective:   Physical Exam Vitals signs reviewed.  Constitutional:      Appearance: Normal appearance.  HENT:     Head: Normocephalic and atraumatic.     Right Ear: External ear normal.     Left Ear: External ear normal.     Nose: Nose normal.     Mouth/Throat:     Mouth: Mucous membranes are moist.     Pharynx: Oropharynx is clear.  Eyes:     Extraocular Movements: Extraocular movements intact.     Conjunctiva/sclera: Conjunctivae  normal.     Pupils: Pupils are equal, round, and reactive to light.  Neck:     Musculoskeletal: Normal range of motion.  Cardiovascular:     Rate and Rhythm: Normal rate and regular rhythm.  Pulmonary:     Effort: Pulmonary effort is normal.  Musculoskeletal: Normal range of motion.        General: No swelling.  Skin:    General: Skin is warm and dry.  Neurological:     General: No focal deficit present.     Mental Status: He is alert and oriented to person, place, and time. Mental status is at baseline.  Psychiatric:        Mood and Affect: Mood normal.        Behavior: Behavior normal.        Thought Content: Thought content normal.        Judgment: Judgment normal.    BP (!) 150/61 (BP Location: Right Arm)   Pulse 60   Resp 16   Ht 5' 8.5" (1.74 m)   Wt 150 lb (68 kg)   BMI 22.48 kg/m   Past Medical History:  Diagnosis Date  . Cancer ([place])   . Chronic kidney disease   . Colon polyp   . Diabetes mellitus without complication ([place])   . Heart murmur 2008  . Hyperlipidemia   . Hypertension 1980  . Retinal detachment    Social History   Socioeconomic History  . Marital status: Widowed    Spouse name: Not on  file  . Number of children: Not on file  . Years of education: Not on file  . Highest education level: Not on file  Occupational History  . Not on file  Social Needs  . Financial resource strain: Not on file  . Food insecurity:    Worry: Not on file    Inability: Not on file  . Transportation needs:    Medical: Not on file    Non-medical: Not on file  Tobacco Use  . Smoking status: Never Smoker  . Smokeless tobacco: Never Used  Substance and Sexual Activity  . Alcohol use: No  . Drug use: No  . Sexual activity: Not on file  Lifestyle  . Physical activity:    Days per week: Not on file    Minutes per session: Not on file  . Stress: Not on file  Relationships  . Social connections:    Talks on phone: Not on file    Gets together: Not on file     Attends religious service: Not on file    Active member of club or organization: Not on file    Attends meetings of clubs or organizations: Not on file    Relationship status: Not on file  . Intimate partner violence:    Fear of current or ex partner: Not on file    Emotionally abused: Not on file    Physically abused: Not on file    Forced sexual activity: Not on file  Other Topics Concern  . Not on file  Social History Narrative  . Not on file   Past Surgical History:  Procedure Laterality Date  . COLONOSCOPY  2011,06/23/13   Dr Bary Castilla  . COLONOSCOPY  06-28-2011  . COLONOSCOPY WITH PROPOFOL N/A 08/30/2015   Procedure: COLONOSCOPY WITH PROPOFOL;  Surgeon: [name], MD;  Location: [facility] ENDOSCOPY;  Service: Endoscopy;  Laterality: N/A;  . EYE SURGERY    . HERNIA REPAIR    . LOWER EXTREMITY ANGIOGRAPHY Right 04/20/2018   Procedure: Lower Extremity Angiography;  Surgeon: Algernon Huxley, MD;  Location: [place] CV LAB;  Service: Cardiovascular;  Laterality: Right;  . POLYPECTOMY  06-23-13  . ]     Family History  Problem Relation Age of Onset  . Colon cancer Mother   . Diabetes Father   . Diabetes Brother   . Stroke Brother   . Diabetes Son    Allergies  Allergen Reactions  . Meperidine Other (See Comments)    Severe bradycardia Severe bradycardia   . Penicillins Rash      Assessment & Plan:  Patient presents for a monthly bilateral lower extremity lymphedema exacerbation / unna boot therapy follow-up.  Over the last 4 weeks, the patient has been undergoing 3 layer zinc oxide and wraps to the bilateral lower extremity for lymphedema exacerbation with ulcer formation.  The patient was seen with his daughter.  The patient notes a market improvement in his edema and his ulcerations have healed.  The patient has purchased himself a pair of medical grade 1 compression socks.  The patient also underwent a bilateral lower extremity venous duplex which was notable  for: Right: Chronic venous insufficiency noted in the proximal small saphenous vein. Left: Chronic venous insufficiency noted in the proximal small saphenous vein and in the popliteal vein. No evidence of deep vein or superficial venous thrombosis noted bilaterally. Patient denies any fever, nausea vomiting.  1. Lymphedema - Stable Patient has been diagnosed with lymphedema and chronic venous  insufficiency. At this time, the patient is not interested in moving forward with any type of endovenous laser ablation intervention to the small saphenous veins bilaterally. The patient has shown a market improvement in his bilateral lower extremity edema and his ulcerations are now healed I recommend stopping the 3 layer zinc oxide Unna wraps to the bilateral lower legs and transitioning to medical grade 1 compression socks. The patient was encouraged to wear graduated compression stockings (20-30 mmHg) on a daily basis. The patient was instructed to begin wearing the stockings first thing in the morning and removing them in the evening. The patient was instructed specifically not to sleep in the stockings.  Behavioral modification including elevation during the day will be continued. We discussed a lymphedema pump if conventional therapy goes not work. The patient was advised to follow up in three months after wearing her compression stockings daily with elevation. At this time, the patient would like to follow-up PRN The patient was instructed to call the office in the interim if any worsening edema or ulcerations to the legs, feet or toes occurs. The patient expresses their understanding.  2. Chronic venous insufficiency - New As above  3. Ulcers of both lower legs, limited to breakdown of skin ([place]) - Resolved As above  Current Outpatient Medications on File Prior to Visit  Medication Sig Dispense Refill  . amLODipine (NORVASC) 10 MG tablet Take 10 mg by mouth daily.     Marland Kitchen aspirin EC 81 MG EC  tablet Take 1 tablet (81 mg total) by mouth daily. 30 tablet 0  . atorvastatin (LIPITOR) 10 MG tablet Take 1 tablet (10 mg total) by mouth daily at 6 PM. 30 tablet 0  . cloNIDine (CATAPRES - DOSED IN MG/24 HR) 0.2 mg/24hr patch Place 0.2 mg onto the skin every Sunday.     . clopidogrel (PLAVIX) 75 MG tablet Take 1 tablet (75 mg total) by mouth daily. 300 tablet 0  . Ferrous Sulfate (IRON) 28 MG TABS Take 1 tablet by mouth.    . furosemide (LASIX) 40 MG tablet Take 40 mg by mouth daily. Taking 40mg  each am and 20mg  each evening.    Marland Kitchen GLIPIZIDE XL 2.5 MG 24 hr tablet Take 2.5 mg by mouth daily.     Marland Kitchen JALYN 0.5-0.4 MG CAPS Take 1 capsule by mouth daily.     Marland Kitchen labetalol (NORMODYNE) 200 MG tablet Take 100 mg by mouth daily.    Marland Kitchen linagliptin (TRADJENTA) 5 MG TABS tablet Take 5 mg by mouth daily.    Marland Kitchen loperamide (IMODIUM) 2 MG capsule Take 2 mg by mouth as needed for diarrhea or loose stools.    . Multiple Vitamin (MULTIVITAMIN) capsule Take 1 capsule by mouth daily.     No current facility-administered medications on file prior to visit.    There are no Patient Instructions on file for this visit. No follow-ups on file.   A , PA-C

## 2018-08-03 ENCOUNTER — Encounter (INDEPENDENT_AMBULATORY_CARE_PROVIDER_SITE_OTHER): Payer: Self-pay | Admitting: Nurse Practitioner

## 2018-08-27 ENCOUNTER — Encounter (INDEPENDENT_AMBULATORY_CARE_PROVIDER_SITE_OTHER): Payer: Medicare Other

## 2018-08-27 ENCOUNTER — Ambulatory Visit (INDEPENDENT_AMBULATORY_CARE_PROVIDER_SITE_OTHER): Payer: Medicare Other | Admitting: Nurse Practitioner

## 2018-09-05 ENCOUNTER — Other Ambulatory Visit: Payer: Self-pay

## 2018-09-05 ENCOUNTER — Inpatient Hospital Stay
Admission: EM | Admit: 2018-09-05 | Discharge: 2018-09-07 | DRG: 603 | Disposition: A | Discharge status: Home / Self Care | Payer: Medicare Other | Attending: Internal Medicine | Admitting: Internal Medicine

## 2018-09-05 ENCOUNTER — Inpatient Hospital Stay: Payer: Medicare Other

## 2018-09-05 DIAGNOSIS — Z800 Family history of malignant neoplasm of digestive organs: Secondary | ICD-10-CM | POA: Diagnosis not present

## 2018-09-05 DIAGNOSIS — D631 Anemia in chronic kidney disease: Secondary | ICD-10-CM | POA: Diagnosis present

## 2018-09-05 DIAGNOSIS — L03114 Cellulitis of left upper limb: Principal | ICD-10-CM | POA: Diagnosis present

## 2018-09-05 DIAGNOSIS — N184 Chronic kidney disease, stage 4 (severe): Secondary | ICD-10-CM | POA: Diagnosis present

## 2018-09-05 DIAGNOSIS — N400 Enlarged prostate without lower urinary tract symptoms: Secondary | ICD-10-CM | POA: Diagnosis present

## 2018-09-05 DIAGNOSIS — Z7982 Long term (current) use of aspirin: Secondary | ICD-10-CM

## 2018-09-05 DIAGNOSIS — Z7902 Long term (current) use of antithrombotics/antiplatelets: Secondary | ICD-10-CM

## 2018-09-05 DIAGNOSIS — Z833 Family history of diabetes mellitus: Secondary | ICD-10-CM

## 2018-09-05 DIAGNOSIS — Z823 Family history of stroke: Secondary | ICD-10-CM | POA: Diagnosis not present

## 2018-09-05 DIAGNOSIS — M25422 Effusion, left elbow: Secondary | ICD-10-CM | POA: Diagnosis present

## 2018-09-05 DIAGNOSIS — Z66 Do not resuscitate: Secondary | ICD-10-CM | POA: Diagnosis present

## 2018-09-05 DIAGNOSIS — E1122 Type 2 diabetes mellitus with diabetic chronic kidney disease: Secondary | ICD-10-CM | POA: Diagnosis present

## 2018-09-05 DIAGNOSIS — E785 Hyperlipidemia, unspecified: Secondary | ICD-10-CM | POA: Diagnosis present

## 2018-09-05 DIAGNOSIS — I129 Hypertensive chronic kidney disease with stage 1 through stage 4 chronic kidney disease, or unspecified chronic kidney disease: Secondary | ICD-10-CM | POA: Diagnosis present

## 2018-09-05 DIAGNOSIS — M7989 Other specified soft tissue disorders: Secondary | ICD-10-CM

## 2018-09-05 LAB — MRSA PCR SCREENING: MRSA by PCR: NEGATIVE

## 2018-09-05 LAB — BASIC METABOLIC PANEL
ANION GAP: 13 (ref 5–15)
BUN: 70 mg/dL — AB (ref 8–23)
CALCIUM: 9 mg/dL (ref 8.9–10.3)
CO2: 21 mmol/L — ABNORMAL LOW (ref 22–32)
CREATININE: 2.92 mg/dL — AB (ref 0.61–1.24)
Chloride: 105 mmol/L (ref 98–111)
GFR calc Af Amer: 22 mL/min — ABNORMAL LOW (ref >60)
GFR, EST NON AFRICAN AMERICAN: 19 mL/min — AB (ref >60)
GLUCOSE: 153 mg/dL — AB (ref 70–99)
Potassium: 4.3 mmol/L (ref 3.5–5.1)
Sodium: 139 mmol/L (ref 135–145)

## 2018-09-05 LAB — LACTIC ACID, PLASMA: LACTIC ACID, VENOUS: 0.9 mmol/L (ref 0.5–1.9)

## 2018-09-05 LAB — CBC WITH DIFFERENTIAL/PLATELET
Abs Immature Granulocytes: 0.05 10*3/uL (ref 0.00–0.07)
BASOS PCT: 0 %
Basophils Absolute: 0 10*3/uL (ref 0.0–0.1)
EOS ABS: 1 10*3/uL — AB (ref 0.0–0.5)
EOS PCT: 11 %
HCT: 33.3 % — ABNORMAL LOW (ref 39.0–52.0)
Hemoglobin: 10.9 g/dL — ABNORMAL LOW (ref 13.0–17.0)
Immature Granulocytes: 1 %
Lymphocytes Relative: 10 %
Lymphs Abs: 0.8 10*3/uL (ref 0.7–4.0)
MCH: 30.7 pg (ref 26.0–34.0)
MCHC: 32.7 g/dL (ref 30.0–36.0)
MCV: 93.8 fL (ref 80.0–100.0)
MONO ABS: 0.8 10*3/uL (ref 0.1–1.0)
MONOS PCT: 9 %
NEUTROS PCT: 69 %
Neutro Abs: 6 10*3/uL (ref 1.7–7.7)
PLATELETS: 181 10*3/uL (ref 150–400)
RBC: 3.55 MIL/uL — ABNORMAL LOW (ref 4.22–5.81)
RDW: 13 % (ref 11.5–15.5)
WBC: 8.7 10*3/uL (ref 4.0–10.5)
nRBC: 0 % (ref 0.0–0.2)

## 2018-09-05 LAB — SEDIMENTATION RATE: Sed Rate: 28 mm/hr — ABNORMAL HIGH (ref 0–20)

## 2018-09-05 LAB — GLUCOSE, CAPILLARY
Glucose-Capillary: 233 mg/dL — ABNORMAL HIGH (ref 70–99)
Glucose-Capillary: 129 mg/dL — ABNORMAL HIGH (ref 70–99)

## 2018-09-05 LAB — C-REACTIVE PROTEIN: CRP: 0.8 mg/dL (ref <1.0)

## 2018-09-05 MED ORDER — VANCOMYCIN HCL IN DEXTROSE 1-5 GM/200ML-% IV SOLN
1000.0000 mg | Freq: Once | INTRAVENOUS | Status: DC
  Administered 2018-09-05: 1000 mg via INTRAVENOUS
  Filled 2018-09-05: qty 200

## 2018-09-05 MED ORDER — TAMSULOSIN HCL 0.4 MG PO CAPS
0.4000 mg | ORAL_CAPSULE | Freq: Every day | ORAL | Status: DC
  Administered 2018-09-05 – 2018-09-06 (×2): 0.4 mg via ORAL
  Filled 2018-09-05: qty 1

## 2018-09-05 MED ORDER — ENOXAPARIN SODIUM 30 MG/0.3ML ~~LOC~~ SOLN
30.0000 mg | SUBCUTANEOUS | Status: DC
  Administered 2018-09-05 – 2018-09-06 (×2): 30 mg via SUBCUTANEOUS
  Filled 2018-09-05 (×2): qty 0.3

## 2018-09-05 MED ORDER — DUTASTERIDE-TAMSULOSIN HCL 0.5-0.4 MG PO CAPS: 1.0000 | ORAL_CAPSULE | Freq: Every day | ORAL | Status: DC

## 2018-09-05 MED ORDER — LABETALOL HCL 100 MG PO TABS
100.0000 mg | ORAL_TABLET | Freq: Every day | ORAL | Status: DC
  Administered 2018-09-06 – 2018-09-07 (×2): 100 mg via ORAL
  Filled 2018-09-05 (×2): qty 1

## 2018-09-05 MED ORDER — FUROSEMIDE 20 MG PO TABS
20.0000 mg | ORAL_TABLET | Freq: Every day | ORAL | Status: DC
  Administered 2018-09-06: 20 mg via ORAL
  Filled 2018-09-05: qty 1

## 2018-09-05 MED ORDER — VANCOMYCIN HCL IN DEXTROSE 1-5 GM/200ML-% IV SOLN
1000.0000 mg | INTRAVENOUS | Status: DC
  Filled 2018-09-05: qty 200

## 2018-09-05 MED ORDER — INSULIN ASPART 100 UNIT/ML ~~LOC~~ SOLN: 0.0000 [IU] | Freq: Every day | SUBCUTANEOUS | Status: DC

## 2018-09-05 MED ORDER — DUTASTERIDE 0.5 MG PO CAPS
0.5000 mg | ORAL_CAPSULE | Freq: Every day | ORAL | Status: DC
  Filled 2018-09-05: qty 1

## 2018-09-05 MED ORDER — SODIUM CHLORIDE 0.9 % IV SOLN
1.0000 g | Freq: Once | INTRAVENOUS | Status: AC
  Administered 2018-09-05: 1 g via INTRAVENOUS
  Filled 2018-09-05: qty 10

## 2018-09-05 MED ORDER — SODIUM CHLORIDE 0.9 % IV SOLN
1.0000 g | INTRAVENOUS | Status: DC
  Administered 2018-09-06: 1 g via INTRAVENOUS
  Filled 2018-09-05: qty 10
  Filled 2018-09-05: qty 1

## 2018-09-05 MED ORDER — INSULIN ASPART 100 UNIT/ML ~~LOC~~ SOLN
0.0000 [IU] | Freq: Three times a day (TID) | SUBCUTANEOUS | Status: DC
  Administered 2018-09-05: 3 [IU] via SUBCUTANEOUS
  Administered 2018-09-06: 1 [IU] via SUBCUTANEOUS
  Administered 2018-09-06 – 2018-09-07 (×2): 2 [IU] via SUBCUTANEOUS
  Administered 2018-09-07: 1 [IU] via SUBCUTANEOUS
  Filled 2018-09-05 (×5): qty 1

## 2018-09-05 MED ORDER — ONDANSETRON HCL 4 MG PO TABS: 4.0000 mg | ORAL_TABLET | Freq: Four times a day (QID) | ORAL | Status: DC | PRN

## 2018-09-05 MED ORDER — ASPIRIN EC 81 MG PO TBEC
81.0000 mg | DELAYED_RELEASE_TABLET | Freq: Every day | ORAL | Status: DC
  Administered 2018-09-06 – 2018-09-07 (×2): 81 mg via ORAL
  Filled 2018-09-05 (×2): qty 1

## 2018-09-05 MED ORDER — ENOXAPARIN SODIUM 40 MG/0.4ML ~~LOC~~ SOLN: 40.0000 mg | SUBCUTANEOUS | Status: DC

## 2018-09-05 MED ORDER — POLYETHYLENE GLYCOL 3350 17 G PO PACK: 17.0000 g | PACK | Freq: Every day | ORAL | Status: DC | PRN

## 2018-09-05 MED ORDER — ACETAMINOPHEN 650 MG RE SUPP: 650.0000 mg | Freq: Four times a day (QID) | RECTAL | Status: DC | PRN

## 2018-09-05 MED ORDER — VANCOMYCIN HCL IN DEXTROSE 750-5 MG/150ML-% IV SOLN
750.0000 mg | Freq: Once | INTRAVENOUS | Status: AC
  Administered 2018-09-05: 750 mg via INTRAVENOUS
  Filled 2018-09-05: qty 150

## 2018-09-05 MED ORDER — CLONIDINE HCL 0.2 MG/24HR TD PTWK
0.2000 mg | MEDICATED_PATCH | TRANSDERMAL | Status: DC
  Filled 2018-09-05: qty 1

## 2018-09-05 MED ORDER — ONDANSETRON HCL 4 MG/2ML IJ SOLN: 4.0000 mg | Freq: Four times a day (QID) | INTRAMUSCULAR | Status: DC | PRN

## 2018-09-05 MED ORDER — AMLODIPINE BESYLATE 10 MG PO TABS
10.0000 mg | ORAL_TABLET | Freq: Every day | ORAL | Status: DC
  Administered 2018-09-06 – 2018-09-07 (×2): 10 mg via ORAL
  Filled 2018-09-05 (×2): qty 1

## 2018-09-05 MED ORDER — DUTASTERIDE 0.5 MG PO CAPS
0.5000 mg | ORAL_CAPSULE | Freq: Every day | ORAL | Status: DC
  Administered 2018-09-05 – 2018-09-06 (×2): 0.5 mg via ORAL
  Filled 2018-09-05 (×3): qty 1

## 2018-09-05 MED ORDER — FUROSEMIDE 40 MG PO TABS
40.0000 mg | ORAL_TABLET | Freq: Every day | ORAL | Status: DC
  Administered 2018-09-06: 40 mg via ORAL
  Filled 2018-09-05 (×2): qty 1

## 2018-09-05 MED ORDER — ACETAMINOPHEN 325 MG PO TABS: 650.0000 mg | ORAL_TABLET | Freq: Four times a day (QID) | ORAL | Status: DC | PRN

## 2018-09-05 MED ORDER — TAMSULOSIN HCL 0.4 MG PO CAPS
0.4000 mg | ORAL_CAPSULE | Freq: Every day | ORAL | Status: DC
  Filled 2018-09-05: qty 1

## 2018-09-05 MED ORDER — CALCITRIOL 0.25 MCG PO CAPS
0.2500 ug | ORAL_CAPSULE | Freq: Three times a day (TID) | ORAL | Status: DC
  Administered 2018-09-05 – 2018-09-07 (×6): 0.25 ug via ORAL
  Filled 2018-09-05 (×8): qty 1

## 2018-09-05 NOTE — Consult Note (Signed)
Pharmacy Antibiotic Note  [name] is a 85 y.o. male admitted on 09/05/2018 with cellulitis.  Pharmacy has been consulted for Vancomycin dosing.  Plan: Patient received Vancomycin IV 1g x 1 in the ED - will finish the load on the floor with 750mg  for a total of 1750mg , followed by:  Vancomycin 1000 mg IV Q 48 hrs. Goal AUC 400-550. Expected AUC: 495 SCr used: 2.92    Temp (24hrs), Avg:98.3 F (36.8 C), Min:98.3 F (36.8 C), Max:98.3 F (36.8 C)  Recent Labs  Lab 09/05/18 1143 09/05/18 1222  WBC 8.7  --   CREATININE 2.92*  --   LATICACIDVEN  --  0.9    CrCl cannot be calculated (Unknown ideal weight.).    Allergies  Allergen Reactions  . Meperidine Other (See Comments)    Severe bradycardia Severe bradycardia   . Penicillins Rash    Antimicrobials this admission: Ceftriaxone 3/28 >>  Vancomycin 3/28 >>   Dose adjustments this admission: None  Microbiology results: 3/28 BCx: pending  Thank you for allowing pharmacy to be a part of this patient's care.  Lu Duffel, PharmD, BCPS Clinical Pharmacist 09/05/2018 3:18 PM

## 2018-09-05 NOTE — Progress Notes (Signed)
Security at bedside to lockup pts wallet ($39.00 cash), key placed in chart.

## 2018-09-05 NOTE — ED Provider Notes (Signed)
[facility] Emergency Department Provider Note ____________________________________________   First MD Initiated Contact with Patient 09/05/18 1131     (approximate)  I have reviewed the triage vital signs and the nursing notes.   HISTORY  Chief Complaint Cellulitis    HPI [name] is a 85 y.o. male with PMH as noted below who presents with left upper extremity swelling, gradual onset over the last several days, associated with redness and pain especially to the distal arm, and not responding to antibiotics.  The patient saw his primary care doctor 2 days ago and was diagnosed with cellulitis.  He was prescribed clindamycin and states he has been taking it.  He reports some chills but no fever.  He denies any other acute symptoms.  Past Medical History:  Diagnosis Date  . Cancer ([place])   . Chronic kidney disease   . Colon polyp   . Diabetes mellitus without complication ([place])   . Heart murmur 2008  . Hyperlipidemia   . Hypertension 1980  . Retinal detachment     Patient Active Problem List   Diagnosis Date Noted  . Lymphedema 07/27/2018  . Chronic venous insufficiency 07/27/2018  . Ulcers of both lower legs, limited to breakdown of skin ([place]) 06/29/2018  . Peripheral arterial disease ([place]) 05/29/2018  . DM2 (diabetes mellitus, type 2) ([place]) 04/04/2018  . HTN (hypertension) 04/04/2018  . History of colonic polyps 07/14/2015    Past Surgical History:  Procedure Laterality Date  . COLONOSCOPY  2011,06/23/13   Dr Bary Castilla  . COLONOSCOPY  06-28-2011  . COLONOSCOPY WITH PROPOFOL N/A 08/30/2015   Procedure: COLONOSCOPY WITH PROPOFOL;  Surgeon: [name], MD;  Location: [facility] ENDOSCOPY;  Service: Endoscopy;  Laterality: N/A;  . EYE SURGERY    . HERNIA REPAIR    . LOWER EXTREMITY ANGIOGRAPHY Right 04/20/2018   Procedure: Lower Extremity Angiography;  Surgeon: Algernon Huxley, MD;  Location: [place] CV LAB;  Service: Cardiovascular;   Laterality: Right;  . POLYPECTOMY  06-23-13  . ]      Prior to Admission medications   Medication Sig Start Date End Date Taking? Authorizing Provider  amLODipine (NORVASC) 10 MG tablet Take 10 mg by mouth daily.  05/28/13  Yes [provider]  aspirin EC 81 MG EC tablet Take 1 tablet (81 mg total) by mouth daily. 04/22/18  Yes Max Sane, MD  calcitRIOL (ROCALTROL) 0.25 MCG capsule Take 0.25 mcg by mouth 3 (three) times daily.  07/13/18  Yes [provider]  clindamycin (CLEOCIN) 300 MG capsule Take 300 mg by mouth 3 (three) times daily. 09/03/18 09/13/18 Yes [provider]  cloNIDine (CATAPRES - DOSED IN MG/24 HR) 0.2 mg/24hr patch Place 0.2 mg onto the skin every Sunday.    Yes [provider]  Ferrous Sulfate (IRON) 28 MG TABS Take 1 tablet by mouth.   Yes [provider]  furosemide (LASIX) 40 MG tablet Take 20-40 mg by mouth 2 (two) times daily. Taking 40mg  each am and 20mg  each evening. 03/25/18  Yes [provider]  JALYN 0.5-0.4 MG CAPS Take 1 capsule by mouth daily.    Yes [provider]  labetalol (NORMODYNE) 200 MG tablet Take 100 mg by mouth daily. 03/25/18  Yes [provider]  linagliptin (TRADJENTA) 5 MG TABS tablet Take 5 mg by mouth daily.   Yes [provider]  Multiple Vitamin (MULTIVITAMIN) capsule Take 1 capsule by mouth daily.   Yes [provider]  atorvastatin (LIPITOR) 10 MG tablet Take 1 tablet (10 mg total) by mouth daily at 6 PM. Patient not taking: Reported on 09/05/2018 04/21/18   Max Sane, MD  clopidogrel (PLAVIX) 75 MG tablet Take 1 tablet (75 mg total) by mouth daily. Patient not taking: Reported on 09/05/2018 04/22/18   Max Sane, MD    Allergies Meperidine and Penicillins  Family History  Problem Relation Age of Onset  . Colon cancer Mother   . Diabetes Father   . Diabetes Brother   . Stroke Brother   . Diabetes Son     Social History Social History    Tobacco Use  . Smoking status: Never Smoker  . Smokeless tobacco: Never Used  Substance Use Topics  . Alcohol use: No  . Drug use: No    Review of Systems  Constitutional: Positive for chills. Eyes: No redness. ENT: No sore throat. Cardiovascular: Denies chest pain. Respiratory: Denies shortness of breath. Gastrointestinal: No vomiting or diarrhea.  Genitourinary: Negative for dysuria.  Musculoskeletal: Negative for back pain. Skin: Negative for rash. Neurological: Negative for headache.   ____________________________________________   PHYSICAL EXAM:  VITAL SIGNS: ED Triage Vitals  Enc Vitals Group     BP 09/05/18 1127 (!) 159/75     Pulse Rate 09/05/18 1127 68     Resp 09/05/18 1127 14     Temp 09/05/18 1127 98.3 F (36.8 C)     Temp Source 09/05/18 1127 Oral     SpO2 09/05/18 1127 98 %     Weight --      Height --      Head Circumference --      Peak Flow --      Pain Score 09/05/18 1128 0     Pain Loc --      Pain Edu? --      Excl. in [place]? --     Constitutional: Alert and oriented.  Relatively well appearing and in no acute distress. Eyes: Conjunctivae are normal.  Head: Atraumatic. Nose: No congestion/rhinnorhea. Mouth/Throat: Mucous membranes are moist.   Neck: Normal range of motion.  Cardiovascular: Normal rate, regular rhythm. Good peripheral circulation. Respiratory: Normal respiratory effort.  No retractions. Gastrointestinal: No distention.  Musculoskeletal: Extremities warm and well perfused.  Left arm with erythema, warmth and induration from just distal to the elbow out to the hand with some swelling.  2+ radial pulse. Neurologic:  Normal speech and language. No gross focal neurologic deficits are appreciated.  Motor and sensory intact to the left hand. Skin:  Skin is warm and dry.  Psychiatric: Mood and affect are normal. Speech and behavior are normal.  ____________________________________________   LABS (all labs ordered are listed, but  only abnormal results are displayed)  Labs Reviewed  BASIC METABOLIC PANEL - Abnormal; Notable for the following components:      Result Value   CO2 21 (*)    Glucose, Bld 153 (*)    BUN 70 (*)    Creatinine, Ser 2.92 (*)    GFR calc non Af Amer 19 (*)    GFR calc Af Amer 22 (*)    All other components within normal limits  CBC WITH DIFFERENTIAL/PLATELET - Abnormal; Notable for the following components:   RBC 3.55 (*)    Hemoglobin 10.9 (*)    HCT 33.3 (*)    Eosinophils Absolute 1.0 (*)    All other components within normal limits  SEDIMENTATION RATE - Abnormal; Notable for the following components:  Sed Rate 28 (*)    All other components within normal limits  CULTURE, BLOOD (ROUTINE X 2)  CULTURE, BLOOD (ROUTINE X 2)  LACTIC ACID, PLASMA  C-REACTIVE PROTEIN  LACTIC ACID, PLASMA   ____________________________________________  EKG   ____________________________________________  RADIOLOGY    ____________________________________________   PROCEDURES  Procedure(s) performed: No  Procedures  Critical Care performed: No ____________________________________________   INITIAL IMPRESSION / ASSESSMENT AND PLAN / ED COURSE  Pertinent labs & imaging results that were available during my care of the patient were reviewed by me and considered in my medical decision making (see chart for details).  85 year old male with PMH as noted above presents with pain, redness, and swelling to the distal left arm with concern for cellulitis.  I reviewed the past medical records in epic.  The patient was evaluated by his PMD Dr. Chrystine Oiler on 09/03/2018, diagnosed with cellulitis and started on clindamycin.  The patient reports that he has been compliant with the antibiotic but the pain and swelling have persisted.  On exam, the patient is overall relatively well-appearing for his age and his vital signs are normal.  He has erythema, induration, and some swelling to the distal left  arm from just past the elbow out.  He has full range of motion at the elbow and wrist.  The remainder the exam is as described above.  Overall presentation is consistent with cellulitis.  The patient has no specific risk factors for DVT or anything to suggest upper extremity DVT, which would be relatively uncommon.  The presentation overall is consistent with cellulitis.  We will obtain a lab work-up.  I anticipate that the patient will require admission as he has essentially failed outpatient oral therapy.  ----------------------------------------- 2:26 PM on 09/05/2018 -----------------------------------------  Lab work-up is reassuring and there is no evidence of sepsis.  However because he has failed outpatient therapy I will admit the patient.  I ordered empiric antibiotics for cellulitis.  I signed him out to the hospitalist Dr. Brett Albino.  ____________________________________________   FINAL CLINICAL IMPRESSION(S) / ED DIAGNOSES  Final diagnoses:  Cellulitis of left upper extremity      NEW MEDICATIONS STARTED DURING THIS VISIT:  New Prescriptions   No medications on file     Note:  This document was prepared using Dragon voice recognition software and may include unintentional dictation errors.    Arta Silence, MD 09/05/18 1427

## 2018-09-05 NOTE — ED Notes (Signed)
Pt assisted to bathroom. Pt with steady gait. Peri-care performed independently. Pt assisted back to bed. This RN will continue to monitor.

## 2018-09-05 NOTE — Progress Notes (Signed)
PHARMACIST - PHYSICIAN COMMUNICATION  CONCERNING:  Enoxaparin (Lovenox) for DVT Prophylaxis    RECOMMENDATION: Patient was prescribed enoxaprin 40mg  q24 hours for VTE prophylaxis.   Filed Weights   09/05/18 1508  Weight: 156 lb 3.2 oz (70.9 kg)    Body mass index is 23.4 kg/m.  Estimated Creatinine Clearance: 18.5 mL/min (A) (by C-G formula based on SCr of 2.92 mg/dL (H)).  Patient is candidate for enoxaparin 30mg  every 24 hours based on CrCl <43ml/min   DESCRIPTION: Pharmacy has adjusted enoxaparin dose per [facility] policy.   Patient is now receiving enoxaparin 30mg  every 24 hours.  Lu Duffel, PharmD, BCPS Clinical Pharmacist 09/05/2018 4:47 PM;

## 2018-09-05 NOTE — ED Triage Notes (Signed)
Pt presents via POIV c/o cellulitis to LUE. Not improving on PO abx.

## 2018-09-05 NOTE — ED Notes (Signed)
.. ED TO INPATIENT HANDOFF REPORT  ED Nurse Name and Phone #: Deneise Lever [phone]  G Name/Age/Gender [name] 85 y.o. male Room/Bed: ED13A/ED13A  Code Status   Code Status: Prior  Home/SNF/Other Home Patient oriented to: self, place, time and situation Is this baseline? Yes   Triage Complete: Triage complete  Chief Complaint Cellulitis  Triage Note Pt presents via POIV c/o cellulitis to LUE. Not improving on PO abx.    Allergies Allergies  Allergen Reactions  . Meperidine Other (See Comments)    Severe bradycardia Severe bradycardia   . Penicillins Rash    Level of Care/Admitting Diagnosis ED Disposition    ED Disposition Condition Comment   Admit  The patient appears reasonably stabilized for admission considering the current resources, flow, and capabilities available in the ED at this time, and I doubt any other [facility] requiring further screening and/or treatment in the ED prior to admission is  present.       B Medical/Surgery History Past Medical History:  Diagnosis Date  . Cancer ([place])   . Chronic kidney disease   . Colon polyp   . Diabetes mellitus without complication ([place])   . Heart murmur 2008  . Hyperlipidemia   . Hypertension 1980  . Retinal detachment    Past Surgical History:  Procedure Laterality Date  . COLONOSCOPY  2011,06/23/13   Dr Bary Castilla  . COLONOSCOPY  06-28-2011  . COLONOSCOPY WITH PROPOFOL N/A 08/30/2015   Procedure: COLONOSCOPY WITH PROPOFOL;  Surgeon: [name], MD;  Location: [facility] ENDOSCOPY;  Service: Endoscopy;  Laterality: N/A;  . EYE SURGERY    . HERNIA REPAIR    . LOWER EXTREMITY ANGIOGRAPHY Right 04/20/2018   Procedure: Lower Extremity Angiography;  Surgeon: Algernon Huxley, MD;  Location: [place] CV LAB;  Service: Cardiovascular;  Laterality: Right;  . POLYPECTOMY  06-23-13  . ]       A IV Location/Drains/Wounds Patient Lines/Drains/Airways Status   Active Line/Drains/Airways    Name:   Placement date:    Placement time:   Site:   Days:   Peripheral IV 09/05/18 Right Forearm   09/05/18    1145    Forearm   less than 1          Intake/Output Last 24 hours No intake or output data in the 24 hours ending 09/05/18 1443  Labs/Imaging Results for orders placed or performed during the hospital encounter of 09/05/18 (from the past 48 hour(s))  Basic metabolic panel     Status: Abnormal   Collection Time: 09/05/18 11:43 AM  Result Value Ref Range   Sodium 139 135 - 145 mmol/L   Potassium 4.3 3.5 - 5.1 mmol/L   Chloride 105 98 - 111 mmol/L   CO2 21 (L) 22 - 32 mmol/L   Glucose, Bld 153 (H) 70 - 99 mg/dL   BUN 70 (H) 8 - 23 mg/dL   Creatinine, Ser 2.92 (H) 0.61 - 1.24 mg/dL   Calcium 9.0 8.9 - 10.3 mg/dL   GFR calc non Af Amer 19 (L) >60 mL/min   GFR calc Af Amer 22 (L) >60 mL/min   Anion gap 13 5 - 15    Comment: Performed at [facility], [address] [place], [place] [address]  CBC with Differential     Status: Abnormal   Collection Time: 09/05/18 11:43 AM  Result Value Ref Range   WBC 8.7 4.0 - 10.5 K/uL   RBC 3.55 (L) 4.22 - 5.81 MIL/uL  Hemoglobin 10.9 (L) 13.0 - 17.0 g/dL   HCT 33.3 (L) 39.0 - 52.0 %   MCV 93.8 80.0 - 100.0 fL   MCH 30.7 26.0 - 34.0 pg   MCHC 32.7 30.0 - 36.0 g/dL   RDW 13.0 11.5 - 15.5 %   Platelets 181 150 - 400 K/uL   nRBC 0.0 0.0 - 0.2 %   Neutrophils Relative % 69 %   Neutro Abs 6.0 1.7 - 7.7 K/uL   Lymphocytes Relative 10 %   Lymphs Abs 0.8 0.7 - 4.0 K/uL   Monocytes Relative 9 %   Monocytes Absolute 0.8 0.1 - 1.0 K/uL   Eosinophils Relative 11 %   Eosinophils Absolute 1.0 (H) 0.0 - 0.5 K/uL   Basophils Relative 0 %   Basophils Absolute 0.0 0.0 - 0.1 K/uL   Immature Granulocytes 1 %   Abs Immature Granulocytes 0.05 0.00 - 0.07 K/uL    Comment: Performed at [facility], [address] [place], [place] [address]  Sedimentation rate     Status: Abnormal   Collection Time: 09/05/18 11:43 AM  Result Value Ref Range   Sed  Rate 28 (H) 0 - 20 mm/hr    Comment: Performed at [facility], [address] [place], [place] [address]  Lactic acid, plasma     Status: None   Collection Time: 09/05/18 12:22 PM  Result Value Ref Range   Lactic Acid, Venous 0.9 0.5 - 1.9 mmol/L    Comment: Performed at [facility], [address] [place], [place] [address]   No results found.  Pending Labs Unresulted Labs (From admission, onward)    Start     Ordered   09/05/18 1206  Culture, blood (routine x 2)  BLOOD CULTURE X 2,   STAT     09/05/18 1205   09/05/18 1206  C-reactive protein  Once,   STAT     09/05/18 1205   09/05/18 1206  Lactic acid, plasma  Now then every 2 hours,   STAT     09/05/18 1205          Vitals/Pain Today's Vitals   09/05/18 1200 09/05/18 1230 09/05/18 1300 09/05/18 1330  BP: 134/72 (!) 142/65 (!) 151/70 (!) 161/73  Pulse: 62 (!) 58 65 65  Resp: 15   14  Temp:      TempSrc:      SpO2: 100% 99% 99% 100%  PainSc:        Isolation Precautions No active isolations  Medications Medications  vancomycin (VANCOCIN) IVPB 1000 mg/200 mL premix (has no administration in time range)  cefTRIAXone (ROCEPHIN) 1 g in sodium chloride 0.9 % 100 mL IVPB (1 g Intravenous New Bag/Given 09/05/18 1422)    Mobility walks     Focused Assessments    R Recommendations: See Admitting Provider Note  Report given to:   Additional Notes:

## 2018-09-05 NOTE — Progress Notes (Signed)
Family Meeting Note  Advance Directive:yes  Today a meeting took place with the Patient.  Patient is able to participate.  The following clinical team members were present during this meeting:MD  The following were discussed:Patient's diagnosis: left arm cellulitis, Patient's progosis: Unable to determine and Goals for treatment: DNR   Patient admitted for left arm cellulitis. He is clinically stable. We discussed code status and he states that he would not want any aggressive measures such as cardiac or pulmonary resuscitation. Will make patient DNR.  Additional follow-up to be provided: prn  Time spent during discussion:20 minutes  Evette Doffing, MD

## 2018-09-05 NOTE — H&P (Addendum)
[facility] NAME: [name]    MR#:  [id number]  DATE OF BIRTH:  [birth year]  DATE OF ADMISSION:  09/05/2018  PRIMARY CARE PHYSICIAN: [name], MD   REQUESTING/REFERRING PHYSICIAN: Arta Silence, MD  CHIEF COMPLAINT:   Chief Complaint  Patient presents with  . Cellulitis    HISTORY OF PRESENT ILLNESS:  [name]  is a 85 y.o. male with a known history of pretension, hyperlipidemia, type 2 diabetes, CKD 4 who presented to the ED with left arm swelling and erythema.  About 5 days ago, he developed a scratch on his left forearm.  He then developed worsening redness and swelling of his left forearm.  He saw his PCP 2 days ago and was given a dose of IM ceftriaxone in clinic.  He was prescribed clindamycin.  He feels like the redness and swelling have gotten a little bit better.  He denies any fevers or chills.  He denies any left arm pain, but endorses itching.  In the ED, his vitals were unremarkable.  Labs significant for creatinine 2.92.  He was started on vancomycin and ceftriaxone.  Hospitalists were called for admission.  PAST MEDICAL HISTORY:   Past Medical History:  Diagnosis Date  . Cancer ([place])   . Chronic kidney disease   . Colon polyp   . Diabetes mellitus without complication ([place])   . Heart murmur 2008  . Hyperlipidemia   . Hypertension 1980  . Retinal detachment     PAST SURGICAL HISTORY:   Past Surgical History:  Procedure Laterality Date  . COLONOSCOPY  2011,06/23/13   Dr Bary Castilla  . COLONOSCOPY  06-28-2011  . COLONOSCOPY WITH PROPOFOL N/A 08/30/2015   Procedure: COLONOSCOPY WITH PROPOFOL;  Surgeon: [name], MD;  Location: [facility] ENDOSCOPY;  Service: Endoscopy;  Laterality: N/A;  . EYE SURGERY    . HERNIA REPAIR    . LOWER EXTREMITY ANGIOGRAPHY Right 04/20/2018   Procedure: Lower Extremity Angiography;  Surgeon: Algernon Huxley, MD;  Location: [place] CV LAB;  Service:  Cardiovascular;  Laterality: Right;  . POLYPECTOMY  06-23-13  . ]      SOCIAL HISTORY:   Social History   Tobacco Use  . Smoking status: Never Smoker  . Smokeless tobacco: Never Used  Substance Use Topics  . Alcohol use: No    FAMILY HISTORY:   Family History  Problem Relation Age of Onset  . Colon cancer Mother   . Diabetes Father   . Diabetes Brother   . Stroke Brother   . Diabetes Son     DRUG ALLERGIES:   Allergies  Allergen Reactions  . Meperidine Other (See Comments)    Severe bradycardia Severe bradycardia   . Penicillins Rash    REVIEW OF SYSTEMS:   Review of Systems  Constitutional: Negative for chills and fever.  HENT: Negative for congestion and sore throat.   Eyes: Negative for blurred vision and double vision.  Respiratory: Negative for cough and shortness of breath.   Cardiovascular: Negative for chest pain and palpitations.  Gastrointestinal: Negative for nausea and vomiting.  Genitourinary: Negative for dysuria and urgency.  Musculoskeletal: Negative for back pain, falls, joint pain and neck pain.  Neurological: Negative for dizziness and headaches.  Psychiatric/Behavioral: Negative for depression. The patient is not nervous/anxious.     MEDICATIONS AT HOME:   Prior to Admission medications   Medication Sig Start Date End Date Taking? Authorizing Provider  amLODipine (  NORVASC) 10 MG tablet Take 10 mg by mouth daily.  05/28/13  Yes [provider]  aspirin EC 81 MG EC tablet Take 1 tablet (81 mg total) by mouth daily. 04/22/18  Yes Max Sane, MD  calcitRIOL (ROCALTROL) 0.25 MCG capsule Take 0.25 mcg by mouth 3 (three) times daily.  07/13/18  Yes [provider]  clindamycin (CLEOCIN) 300 MG capsule Take 300 mg by mouth 3 (three) times daily. 09/03/18 09/13/18 Yes [provider]  cloNIDine (CATAPRES - DOSED IN MG/24 HR) 0.2 mg/24hr patch Place 0.2 mg onto the skin every Sunday.    Yes [provider]  Ferrous  Sulfate (IRON) 28 MG TABS Take 1 tablet by mouth.   Yes [provider]  furosemide (LASIX) 40 MG tablet Take 20-40 mg by mouth 2 (two) times daily. Taking 40mg  each am and 20mg  each evening. 03/25/18  Yes [provider]  JALYN 0.5-0.4 MG CAPS Take 1 capsule by mouth daily.    Yes [provider]  labetalol (NORMODYNE) 200 MG tablet Take 100 mg by mouth daily. 03/25/18  Yes [provider]  linagliptin (TRADJENTA) 5 MG TABS tablet Take 5 mg by mouth daily.   Yes [provider]  Multiple Vitamin (MULTIVITAMIN) capsule Take 1 capsule by mouth daily.   Yes [provider]  atorvastatin (LIPITOR) 10 MG tablet Take 1 tablet (10 mg total) by mouth daily at 6 PM. Patient not taking: Reported on 09/05/2018 04/21/18   Max Sane, MD  clopidogrel (PLAVIX) 75 MG tablet Take 1 tablet (75 mg total) by mouth daily. Patient not taking: Reported on 09/05/2018 04/22/18   Max Sane, MD      VITAL SIGNS:  Blood pressure (!) 161/73, pulse 65, temperature 98.3 F (36.8 C), temperature source Oral, resp. rate 14, SpO2 100 %.  PHYSICAL EXAMINATION:  Physical Exam  GENERAL:  85 y.o.-year-old patient lying in the bed with no acute distress.  EYES: Pupils equal, round, reactive to light and accommodation. No scleral icterus. Extraocular muscles intact.  HEENT: Head atraumatic, normocephalic. Oropharynx and nasopharynx clear.  NECK:  Supple, no jugular venous distention. No thyroid enlargement, no tenderness.  LUNGS: Normal breath sounds bilaterally, no wheezing, rales,rhonchi or crepitation. No use of accessory muscles of respiration.  CARDIOVASCULAR: RRR, S1, S2 normal. No murmurs, rubs, or gallops.  ABDOMEN: Soft, nontender, nondistended. Bowel sounds present. No organomegaly or mass. + Large upper abdominal hernia present.  Hernia is nontender. EXTREMITIES: No pedal edema, cyanosis, or clubbing.  NEUROLOGIC: Cranial nerves II through XII are intact.  Muscle strength 5/5 in all extremities. Sensation intact. Gait not checked.  PSYCHIATRIC: The patient is alert and oriented x 3.  SKIN: Left arm erythema and swelling sending the wrist to just above the elbow (see picture below). + Chronic venous stasis changes in the lower extremities bilaterally.       LABORATORY PANEL:   CBC Recent Labs  Lab 09/05/18 1143  WBC 8.7  HGB 10.9*  HCT 33.3*  PLT 181   ------------------------------------------------------------------------------------------------------------------  Chemistries  Recent Labs  Lab 09/05/18 1143  NA 139  K 4.3  CL 105  CO2 21*  GLUCOSE 153*  BUN 70*  CREATININE 2.92*  CALCIUM 9.0   ------------------------------------------------------------------------------------------------------------------  Cardiac Enzymes No results for input(s): TROPONINI in the last 168 hours. ------------------------------------------------------------------------------------------------------------------  RADIOLOGY:  No results found.    IMPRESSION AND PLAN:   Left arm cellulitis- patient not meeting abscess criteria on admission.  Failed outpatient treatment with clindamycin.  -  Continue vancomycin and ceftriaxone for now. Check MRSA PCR. -Follow-up blood cultures -Check left upper extremity Doppler ultrasound to rule out DVT -Obtain left elbow x-ray, as patient does seem to have some increased swelling over the elbow joint  Hypertension-blood pressures mildly elevated in the ED -Continue home norvasc, clonidine, labetalol  Type 2 diabetes-blood sugars mildly elevated -Sensitive SSI  CKD 4- creatinine mildly elevated compared to baseline -Will hold home Lasix for now -Avoid nephrotoxic agents as able  BPH-stable -Continue home dutasteride-tamsulosin  Anemia of chronic kidney disease- hemoglobin at baseline -Monitor  All the records are reviewed and case discussed with ED provider. Management plans discussed  with the patient, family and they are in agreement.  CODE STATUS: DNR  TOTAL TIME TAKING CARE OF THIS PATIENT: 45 minutes.    Berna Spare Glorianna Gott M.D on 09/05/2018 at 2:11 PM  Between 7am to 6pm - Pager [phone]  After 6pm go to [email or web address] - Proofreader  [employer] [place] [facility]  Office  [phone]  CC: Primary care physician; [name], MD   Note: This dictation was prepared with Dragon dictation along with smaller phrase technology. Any transcriptional errors that result from this process are unintentional.

## 2018-09-05 NOTE — ED Notes (Signed)
Ronie Spies, Daughter and Medical POA [phone].

## 2018-09-06 ENCOUNTER — Inpatient Hospital Stay: Payer: Medicare Other

## 2018-09-06 LAB — BASIC METABOLIC PANEL
Anion gap: 11 (ref 5–15)
BUN: 65 mg/dL — ABNORMAL HIGH (ref 8–23)
CO2: 21 mmol/L — ABNORMAL LOW (ref 22–32)
Calcium: 8.8 mg/dL — ABNORMAL LOW (ref 8.9–10.3)
Chloride: 107 mmol/L (ref 98–111)
Creatinine, Ser: 2.72 mg/dL — ABNORMAL HIGH (ref 0.61–1.24)
GFR calc Af Amer: 24 mL/min — ABNORMAL LOW (ref >60)
GFR calc non Af Amer: 21 mL/min — ABNORMAL LOW (ref >60)
Glucose, Bld: 140 mg/dL — ABNORMAL HIGH (ref 70–99)
Potassium: 3.8 mmol/L (ref 3.5–5.1)
Sodium: 139 mmol/L (ref 135–145)

## 2018-09-06 LAB — CBC
HCT: 30.4 % — ABNORMAL LOW (ref 39.0–52.0)
Hemoglobin: 9.9 g/dL — ABNORMAL LOW (ref 13.0–17.0)
MCH: 30.7 pg (ref 26.0–34.0)
MCHC: 32.6 g/dL (ref 30.0–36.0)
MCV: 94.1 fL (ref 80.0–100.0)
Platelets: 152 10*3/uL (ref 150–400)
RBC: 3.23 MIL/uL — ABNORMAL LOW (ref 4.22–5.81)
RDW: 12.9 % (ref 11.5–15.5)
WBC: 8 10*3/uL (ref 4.0–10.5)
nRBC: 0 % (ref 0.0–0.2)

## 2018-09-06 LAB — GLUCOSE, CAPILLARY
Glucose-Capillary: 137 mg/dL — ABNORMAL HIGH (ref 70–99)
Glucose-Capillary: 120 mg/dL — ABNORMAL HIGH (ref 70–99)
Glucose-Capillary: 167 mg/dL — ABNORMAL HIGH (ref 70–99)
Glucose-Capillary: 142 mg/dL — ABNORMAL HIGH (ref 70–99)

## 2018-09-06 NOTE — Evaluation (Signed)
Physical Therapy Evaluation Patient Details Name: [name] MRN: [id number] DOB: 03/21/1932 Today's Date: 09/06/2018   History of Present Illness  [name] is an 85yo male who comes to [facility] on 3/28 2/2 persistent LUE swelling/redness, already started on ABX with PCP c minimal success. Pt denies any acute changes to balance, AMB, strength, or gross mobility.   Clinical Impression  Pt received in bed, agreeable to participate. Pt performs all mobility independently, denies acute abnormality. Patient is at baseline, time is given to address all questions/concerns. No additional skilled PT services needed at this time, PT signing off. PT recommends daily ambulation ad lib or with nursing staff as needed to prevent deconditioning.      Follow Up Recommendations No PT follow up    Equipment Recommendations  None recommended by PT    Recommendations for Other Services       Precautions / Restrictions Precautions Precautions: Fall Restrictions Weight Bearing Restrictions: No      Mobility  Bed Mobility Overal bed mobility: Independent                Transfers Overall transfer level: Modified independent               General transfer comment: a little stiff from inactivity, typical per baseline   Ambulation/Gait Ambulation/Gait assistance: Independent Gait Distance (Feet): 400 Feet Assistive device: None Gait Pattern/deviations: WFL(Within Functional Limits)   Gait velocity interpretation: >2.62 ft/sec, indicative of community ambulatory    Stairs            Wheelchair Mobility    Modified Rankin (Stroke Patients Only)       Balance Overall balance assessment: Independent;No apparent balance deficits (not formally assessed)                                           Pertinent Vitals/Pain Pain Assessment: No/denies pain    Home Living Family/patient expects to be discharged to:: Private residence Living Arrangements:  Alone     Home Access: Stairs to enter   [employer] of Steps: 1          Prior Function Level of Independence: Independent               Hand Dominance        Extremity/Trunk Assessment   Upper Extremity Assessment Upper Extremity Assessment: Defer to OT evaluation    Lower Extremity Assessment Lower Extremity Assessment: Overall WFL for tasks assessed       Communication   Communication: No difficulties  Cognition Arousal/Alertness: Awake/alert Behavior During Therapy: WFL for tasks assessed/performed Overall Cognitive Status: Within Functional Limits for tasks assessed                                        General Comments      Exercises     Assessment/Plan    PT Assessment Patent does not need any further PT services  PT Problem List         PT Treatment Interventions      PT Goals (Current goals can be found in the Care Plan section)  Acute Rehab PT Goals PT Goal Formulation: All assessment and education complete, DC therapy    Frequency     Barriers to discharge  Co-evaluation               AM-PAC PT "6 Clicks" Mobility  Outcome Measure Help needed turning from your back to your side while in a flat bed without using bedrails?: None Help needed moving from lying on your back to sitting on the side of a flat bed without using bedrails?: None Help needed moving to and from a bed to a chair (including a wheelchair)?: None Help needed standing up from a chair using your arms (e.g., wheelchair or bedside chair)?: None Help needed to walk in hospital room?: None Help needed climbing 3-5 steps with a railing? : None 6 Click Score: 24    End of Session   Activity Tolerance: Patient tolerated treatment well;No increased pain Patient left: in bed;with call bell/phone within reach(pt leaving for [place] soon; chauir setup with linen) Nurse Communication: Mobility status      Time: 9629-5284 PT Time  Calculation (min) (ACUTE ONLY): 14 min   Charges:   PT Evaluation $PT Eval Low Complexity: 1 Low          11:52 AM, 09/06/18 Etta Grandchild, PT, DPT Physical Therapist - [facility]  [phone] ([place])     [place] C 09/06/2018, 11:50 AM

## 2018-09-06 NOTE — Progress Notes (Signed)
[facility] [place] NAME: [name]    MR#:  [id number]  DATE OF BIRTH:  1931/09/24  SUBJECTIVE:  Patient here with left arm cellulitis.  He was treated with clindamycin as outpatient but failed treatment.  REVIEW OF SYSTEMS:    Review of Systems  Constitutional: Negative for fever, chills weight loss HENT: Negative for ear pain, nosebleeds, congestion, facial swelling, rhinorrhea, neck pain, neck stiffness and ear discharge.   Respiratory: Negative for cough, shortness of breath, wheezing  Cardiovascular: Negative for chest pain, palpitations and leg swelling.  Gastrointestinal: Negative for heartburn, abdominal pain, vomiting, diarrhea or consitpation Genitourinary: Negative for dysuria, urgency, frequency, hematuria Musculoskeletal: Negative for back pain or joint pain Neurological: Negative for dizziness, seizures, syncope, focal weakness,  numbness and headaches.  Hematological: Does not bruise/bleed easily.  Psychiatric/Behavioral: Negative for hallucinations, confusion, dysphoric mood Skin left arm cellulitis   Tolerating Diet: yes      DRUG ALLERGIES:   Allergies  Allergen Reactions  . Meperidine Other (See Comments)    Severe bradycardia Severe bradycardia   . Penicillins Rash    VITALS:  Blood pressure (!) 141/69, pulse 66, temperature 98.4 F (36.9 C), temperature source Oral, resp. rate 19, weight 70.9 kg, SpO2 98 %.  PHYSICAL EXAMINATION:  Constitutional: Appears well-developed and well-nourished. No distress. HENT: Normocephalic. Marland Kitchen Oropharynx is clear and moist.  Eyes: Conjunctivae and EOM are normal. PERRLA, no scleral icterus.  Neck: Normal ROM. Neck supple. No JVD. No tracheal deviation. CVS: RRR, S1/S2 +, no murmurs, no gallops, no carotid bruit.  Pulmonary: Effort and breath sounds normal, no stridor, rhonchi, wheezes, rales.  Abdominal: Soft. BS +,  no distension, tenderness, rebound or guarding.   Musculoskeletal: Normal range of motion. No edema and no tenderness.  Neuro: Alert. CN 2-12 grossly intact. No focal deficits. Skin: Skin with erythema  Red patchy and dryness noted between fingers  Small excoriation on forearm of left arm   psychiatric: Normal mood and affect.      LABORATORY PANEL:   CBC Recent Labs  Lab 09/06/18 0341  WBC 8.0  HGB 9.9*  HCT 30.4*  PLT 152   ------------------------------------------------------------------------------------------------------------------  Chemistries  Recent Labs  Lab 09/06/18 0341  NA 139  K 3.8  CL 107  CO2 21*  GLUCOSE 140*  BUN 65*  CREATININE 2.72*  CALCIUM 8.8*   ------------------------------------------------------------------------------------------------------------------  Cardiac Enzymes No results for input(s): TROPONINI in the last 168 hours. ------------------------------------------------------------------------------------------------------------------  RADIOLOGY:  Dg Elbow 2 Views Left  Result Date: 09/05/2018 CLINICAL DATA:  Left arm/elbow swelling. EXAM: LEFT ELBOW - 2 VIEW COMPARISON:  None. FINDINGS: There is no evidence of fracture, dislocation, or joint effusion. No periosteal reaction or bony destructive change. There is a prominent olecranon spur. Mild enthesopathic changes at the medial and lateral condyles. Diffuse soft tissue edema and soft tissue thickening about the elbow. No soft tissue air or radiopaque foreign body. IMPRESSION: 1. Diffuse soft tissue edema. No soft tissue air or radiopaque foreign body. 2. No acute osseous abnormality.  Prominent olecranon spur. Electronically Signed   By: Keith Rake M.D.   On: 09/05/2018 19:26   US Venous Img Upper Uni Left  Result Date: 09/06/2018 CLINICAL DATA:  Left upper extremity edema, erythema and numbness. EXAM: LEFT UPPER EXTREMITY VENOUS DOPPLER ULTRASOUND TECHNIQUE: Gray-scale sonography with graded compression, as well as color  Doppler and duplex ultrasound were performed to evaluate the upper extremity deep venous system from the level  of the subclavian vein and including the jugular, axillary, basilic, radial, ulnar and upper cephalic vein. Spectral Doppler was utilized to evaluate flow at rest and with distal augmentation maneuvers. COMPARISON:  None. FINDINGS: Contralateral Subclavian Vein: Respiratory phasicity is normal and symmetric with the symptomatic side. No evidence of thrombus. Normal compressibility. Internal Jugular Vein: No evidence of thrombus. Normal compressibility, respiratory phasicity and response to augmentation. Subclavian Vein: No evidence of thrombus. Normal compressibility, respiratory phasicity and response to augmentation. Axillary Vein: No evidence of thrombus. Normal compressibility, respiratory phasicity and response to augmentation. Cephalic Vein: No evidence of thrombus. Normal compressibility, respiratory phasicity and response to augmentation. Basilic Vein: No evidence of thrombus. Normal compressibility, respiratory phasicity and response to augmentation. Brachial Veins: No evidence of thrombus. Normal compressibility, respiratory phasicity and response to augmentation. Radial Veins: No evidence of thrombus. Normal compressibility, respiratory phasicity and response to augmentation. Ulnar Veins: No evidence of thrombus. Normal compressibility, respiratory phasicity and response to augmentation. Venous Reflux:  None visualized. Other Findings: No evidence of superficial thrombophlebitis or abnormal fluid collection. IMPRESSION: No evidence of DVT within the left upper extremity. Electronically Signed   By: Aletta Edouard M.D.   On: 09/06/2018 08:22     ASSESSMENT AND PLAN:   85 year old male with history of essential hypertension and diabetes who presents with left arm cellulitis.  1.  Left arm cellulitis: Patient failed outpatient treatment clindamycin CT left arm to evaluate for underlying  abscess Continue vancomycin and ceftriaxone for now Elevate arm Doppler was negative for DVT X-ray shows soft tissue swelling.  2.  Essential hypertension: Continue clonidine, Norvasc and labetalol  3.  Diabetes: Continue sliding scale  4.  BPH: Continue Avodart and Flomax  5.  Chronic kidney disease stage IV: Creatinine seems stable    Management plans discussed with the patient and he is in agreement.  CODE STATUS: dnr  TOTAL TIME TAKING CARE OF THIS PATIENT: 28 minutes.     POSSIBLE D/C 1-2 days, DEPENDING ON CLINICAL CONDITION.   Bettey Costa M.D on 09/06/2018 at 11:13 AM  Between 7am to 6pm - Pager - [phone] After 6pm go to [email or web address] - password [email or web address] [facility]  Office  [phone]  CC: Primary care physician; [name], MD  Note: This dictation was prepared with Dragon dictation along with smaller phrase technology. Any transcriptional errors that result from this process are unintentional.

## 2018-09-06 NOTE — Consult Note (Signed)
Pharmacy Antibiotic Note  [name] is a 85 y.o. male admitted on 09/05/2018 with left arm cellulitis. Patient has failed outpatient therapy of clindamycin. Patient with history significant for cancer, CKD, diabetes, hyperlidpidemia and hypertension. Pharmacy has been consulted for Vancomycin dosing.  Plan: Continue vancomycin 1000mg  IV Q48hr. Will follow up renal function with am labs.   Weight: 156 lb 3.2 oz (70.9 kg) Temp (24hrs), Avg:98.1 F (36.7 C), Min:97.5 F (36.4 C), Max:98.4 F (36.9 C)  Recent Labs  Lab 09/05/18 1143 09/05/18 1222 09/06/18 0341  WBC 8.7  --  8.0  CREATININE 2.92*  --  2.72*  LATICACIDVEN  --  0.9  --     Estimated Creatinine Clearance: 19.9 mL/min (A) (by C-G formula based on SCr of 2.72 mg/dL (H)).    Allergies  Allergen Reactions  . Meperidine Other (See Comments)    Severe bradycardia Severe bradycardia   . Penicillins Rash    Antimicrobials this admission: Ceftriaxone 3/28 >>  Vancomycin 3/28 >>   Dose adjustments this admission: None  Microbiology results: 3/28 BCx: pending  Thank you for allowing pharmacy to be a part of this patient's care.  Osa Campoli L 09/06/2018 10:18 AM

## 2018-09-07 LAB — BASIC METABOLIC PANEL
Anion gap: 12 (ref 5–15)
BUN: 71 mg/dL — ABNORMAL HIGH (ref 8–23)
CO2: 21 mmol/L — ABNORMAL LOW (ref 22–32)
Calcium: 9.1 mg/dL (ref 8.9–10.3)
Chloride: 105 mmol/L (ref 98–111)
Creatinine, Ser: 3.12 mg/dL — ABNORMAL HIGH (ref 0.61–1.24)
GFR calc Af Amer: 20 mL/min — ABNORMAL LOW (ref >60)
GFR calc non Af Amer: 17 mL/min — ABNORMAL LOW (ref >60)
Glucose, Bld: 179 mg/dL — ABNORMAL HIGH (ref 70–99)
Potassium: 4 mmol/L (ref 3.5–5.1)
Sodium: 138 mmol/L (ref 135–145)

## 2018-09-07 LAB — GLUCOSE, CAPILLARY
Glucose-Capillary: 132 mg/dL — ABNORMAL HIGH (ref 70–99)
Glucose-Capillary: 183 mg/dL — ABNORMAL HIGH (ref 70–99)

## 2018-09-07 MED ORDER — CEPHALEXIN 500 MG PO CAPS: 500.0000 mg | ORAL_CAPSULE | Freq: Two times a day (BID) | ORAL | 0 refills | Status: DC

## 2018-09-07 MED ORDER — CEPHALEXIN 250 MG PO CAPS: 250.0000 mg | ORAL_CAPSULE | Freq: Two times a day (BID) | ORAL | 0 refills | Status: DC

## 2018-09-07 MED ORDER — DOXYCYCLINE HYCLATE 100 MG PO CAPS: 100.0000 mg | ORAL_CAPSULE | Freq: Two times a day (BID) | ORAL | 0 refills | Status: AC

## 2018-09-07 MED ORDER — CEPHALEXIN 250 MG PO CAPS: 250.0000 mg | ORAL_CAPSULE | Freq: Two times a day (BID) | ORAL | 0 refills | Status: AC

## 2018-09-07 NOTE — Progress Notes (Signed)
Pt. Discharged to home via sons vehicle. Discharge instructions and medication regimen reviewed at bedside with patient and over the phone with patients daughter in law. Pt. And daughter in law verbalize understanding of instructions and medication regimen. Prescriptions in packet. Patient assessment unchanged from this morning. IV discontinued per policy.

## 2018-09-07 NOTE — Discharge Summary (Signed)
[name] at [place] NAME: [name]    MR#:  [id number]  DATE OF BIRTH:  02-Apr-1932  DATE OF ADMISSION:  09/05/2018 ADMITTING PHYSICIAN: Sela Hua, MD  DATE OF DISCHARGE: 09/07/2018   PRIMARY CARE PHYSICIAN: [name], MD    ADMISSION DIAGNOSIS:  Cellulitis of left upper extremity [Z56.387]  DISCHARGE DIAGNOSIS:  Active Problems:   Left arm cellulitis   SECONDARY DIAGNOSIS:   Past Medical History:  Diagnosis Date  . Cancer ([place])   . Chronic kidney disease   . Colon polyp   . Diabetes mellitus without complication ([place])   . Heart murmur 2008  . Hyperlipidemia   . Hypertension 1980  . Retinal detachment     HOSPITAL COURSE:   85 year old male with history of essential hypertension and diabetes who presents with left arm cellulitis.  1.  Left arm cellulitis: Patient failed outpatient treatment with clindamycin, but has responded to Rocephin and vancomycin. CT did not show evidence of abscess. He will be discharged on oral Keflex and doxycycline.  He will need to follow-up with his PCP in 2 to 3 days.  Continue to elevate arm. Elevate arm Doppler was negative for DVT X-ray shows soft tissue swelling.  2.  Essential hypertension: Continue clonidine, Norvasc and labetalol  3.  Diabetes: Continue ADA diet  4.  BPH: Continue Avodart and Flomax  5.  Chronic kidney disease stage IV: Creatinine seems stable Lasix has been discontinued for now due to chronic kidney disease. This can be reevaluated by his primary care physician    DISCHARGE CONDITIONS AND DIET:   Stable for discharge on heart healthy diet/diabetic  CONSULTS OBTAINED:  Treatment Team:  Bettey Costa, MD  DRUG ALLERGIES:   Allergies  Allergen Reactions  . Meperidine Other (See Comments)    Severe bradycardia Severe bradycardia   . Penicillins Rash    DISCHARGE MEDICATIONS:   Allergies as of 09/07/2018      Reactions   Meperidine  Other (See Comments)   Severe bradycardia Severe bradycardia   Penicillins Rash      Medication List    STOP taking these medications   clindamycin 300 MG capsule Commonly known as:  CLEOCIN   clopidogrel 75 MG tablet Commonly known as:  PLAVIX   furosemide 40 MG tablet Commonly known as:  LASIX     TAKE these medications   amLODipine 10 MG tablet Commonly known as:  NORVASC Take 10 mg by mouth daily.   aspirin 81 MG EC tablet Take 1 tablet (81 mg total) by mouth daily.   atorvastatin 10 MG tablet Commonly known as:  LIPITOR Take 1 tablet (10 mg total) by mouth daily at 6 PM.   calcitRIOL 0.25 MCG capsule Commonly known as:  ROCALTROL Take 0.25 mcg by mouth 3 (three) times daily.   cephALEXin 250 MG capsule Commonly known as:  KEFLEX Take 1 capsule (250 mg total) by mouth 2 (two) times daily for 10 days.   cloNIDine 0.2 mg/24hr patch Commonly known as:  CATAPRES - Dosed in mg/24 hr Place 0.2 mg onto the skin every Sunday.   doxycycline 100 MG capsule Commonly known as:  VIBRAMYCIN Take 1 capsule (100 mg total) by mouth 2 (two) times daily for 10 days.   Iron 28 MG Tabs Take 1 tablet by mouth.   Jalyn 0.5-0.4 MG Caps Generic drug:  Dutasteride-Tamsulosin HCl Take 1 capsule by mouth daily.   labetalol 200 MG tablet Commonly  known as:  NORMODYNE Take 100 mg by mouth daily.   multivitamin capsule Take 1 capsule by mouth daily.   Tradjenta 5 MG Tabs tablet Generic drug:  linagliptin Take 5 mg by mouth daily.         Today   CHIEF COMPLAINT:  Patient is doing well.  He has improved cellulitis He is able to move wrist and elbow without any pain   VITAL SIGNS:  Blood pressure 129/63, pulse 63, temperature 98 F (36.7 C), temperature source Oral, resp. rate 18, height 5\' 8"  (1.727 m), weight 68.8 kg, SpO2 100 %.   REVIEW OF SYSTEMS:  Review of Systems  Constitutional: Negative.  Negative for chills, fever and malaise/fatigue.  HENT:  Negative.  Negative for ear discharge, ear pain, hearing loss, nosebleeds and sore throat.   Eyes: Negative.  Negative for blurred vision and pain.  Respiratory: Negative.  Negative for cough, hemoptysis, shortness of breath and wheezing.   Cardiovascular: Negative.  Negative for chest pain, palpitations and leg swelling.  Gastrointestinal: Negative.  Negative for abdominal pain, blood in stool, diarrhea, nausea and vomiting.  Genitourinary: Negative.  Negative for dysuria.  Musculoskeletal: Negative.  Negative for back pain.  Skin: Negative.        Arm cellultis improved   Neurological: Negative for dizziness, tremors, speech change, focal weakness, seizures and headaches.  Endo/Heme/Allergies: Negative.  Does not bruise/bleed easily.  Psychiatric/Behavioral: Negative.  Negative for depression, hallucinations and suicidal ideas.     PHYSICAL EXAMINATION:  GENERAL:  85 y.o.-year-old patient lying in the bed with no acute distress.  NECK:  Supple, no jugular venous distention. No thyroid enlargement, no tenderness.  LUNGS: Normal breath sounds bilaterally, no wheezing, rales,rhonchi  No use of accessory muscles of respiration.  CARDIOVASCULAR: S1, S2 normal. No murmurs, rubs, or gallops.  ABDOMEN: Soft, non-tender, non-distended. Bowel sounds present. No organomegaly or mass.  EXTREMITIES: No pedal edema, cyanosis, or clubbing.  PSYCHIATRIC: The patient is alert and oriented x 3.  SKIN: Left arm swelling cellulitis is improved.  No area of fluctuance.  He still has some mild erythema.  He has very dry skin.  Erythema around the fingers has much improved.  Swelling is also improved.Marland Kitchen   DATA REVIEW:   CBC Recent Labs  Lab 09/06/18 0341  WBC 8.0  HGB 9.9*  HCT 30.4*  PLT 152    Chemistries  Recent Labs  Lab 09/07/18 0305  NA 138  K 4.0  CL 105  CO2 21*  GLUCOSE 179*  BUN 71*  CREATININE 3.12*  CALCIUM 9.1    Cardiac Enzymes No results for input(s): TROPONINI in the  last 168 hours.  Microbiology Results  @[email or web address] @  RADIOLOGY:  Dg Elbow 2 Views Left  Result Date: 09/05/2018 CLINICAL DATA:  Left arm/elbow swelling. EXAM: LEFT ELBOW - 2 VIEW COMPARISON:  None. FINDINGS: There is no evidence of fracture, dislocation, or joint effusion. No periosteal reaction or bony destructive change. There is a prominent olecranon spur. Mild enthesopathic changes at the medial and lateral condyles. Diffuse soft tissue edema and soft tissue thickening about the elbow. No soft tissue air or radiopaque foreign body. IMPRESSION: 1. Diffuse soft tissue edema. No soft tissue air or radiopaque foreign body. 2. No acute osseous abnormality.  Prominent olecranon spur. Electronically Signed   By: Keith Rake M.D.   On: 09/05/2018 19:26   US Venous Img Upper Uni Left  Result Date: 09/06/2018 CLINICAL DATA:  Left upper extremity edema, erythema and numbness.  EXAM: LEFT UPPER EXTREMITY VENOUS DOPPLER ULTRASOUND TECHNIQUE: Gray-scale sonography with graded compression, as well as color Doppler and duplex ultrasound were performed to evaluate the upper extremity deep venous system from the level of the subclavian vein and including the jugular, axillary, basilic, radial, ulnar and upper cephalic vein. Spectral Doppler was utilized to evaluate flow at rest and with distal augmentation maneuvers. COMPARISON:  None. FINDINGS: Contralateral Subclavian Vein: Respiratory phasicity is normal and symmetric with the symptomatic side. No evidence of thrombus. Normal compressibility. Internal Jugular Vein: No evidence of thrombus. Normal compressibility, respiratory phasicity and response to augmentation. Subclavian Vein: No evidence of thrombus. Normal compressibility, respiratory phasicity and response to augmentation. Axillary Vein: No evidence of thrombus. Normal compressibility, respiratory phasicity and response to augmentation. Cephalic Vein: No evidence of thrombus. Normal compressibility,  respiratory phasicity and response to augmentation. Basilic Vein: No evidence of thrombus. Normal compressibility, respiratory phasicity and response to augmentation. Brachial Veins: No evidence of thrombus. Normal compressibility, respiratory phasicity and response to augmentation. Radial Veins: No evidence of thrombus. Normal compressibility, respiratory phasicity and response to augmentation. Ulnar Veins: No evidence of thrombus. Normal compressibility, respiratory phasicity and response to augmentation. Venous Reflux:  None visualized. Other Findings: No evidence of superficial thrombophlebitis or abnormal fluid collection. IMPRESSION: No evidence of DVT within the left upper extremity. Electronically Signed   By: Aletta Edouard M.D.   On: 09/06/2018 08:22   Ct Elbow Left Wo Contrast  Result Date: 09/06/2018 CLINICAL DATA:  Erythema and swelling around the elbow. Renal dysfunction. EXAM: CT OF THE UPPER LEFT EXTREMITY WITHOUT CONTRAST TECHNIQUE: Multidetector CT imaging of the upper left extremity was performed according to the standard protocol. COMPARISON:  None. FINDINGS: Bones/Joint/Cartilage Mild spurring at the triceps attachment to the olecranon. No significant joint effusion although the posterior fat pad is slightly prominent. Ligaments Suboptimally assessed by CT. Muscles and Tendons There is some faint calcifications proximally in the common extensor tendon on images 72 through 68 of series 8. Soft tissues Diffuse subcutaneous edema around the elbow, especially confluent posteriorly. This appears to be primarily infiltrative, and a discrete fluid collection in the olecranon bursa, if present, is small in size and obscured by the surrounding infiltrative edema. Low-level edema also tracks along antecubital fascia planes. I do not see a drainable abscess. No gas in the soft tissues. IMPRESSION: 1. Infiltrative edema around the elbow, especially confluent posteriorly. Cellulitis is a distinct  possibility. I do not see a drainable abscess if there is any discrete fluid in the olecranon bursa itself, it is obscured by surrounding infiltrative edema. No appreciable gas in the soft tissues. 2. Faint calcifications portions of the common extensor tendon, likely chronic. Electronically Signed   By: Van Clines M.D.   On: 09/06/2018 12:21      Allergies as of 09/07/2018      Reactions   Meperidine Other (See Comments)   Severe bradycardia Severe bradycardia   Penicillins Rash      Medication List    STOP taking these medications   clindamycin 300 MG capsule Commonly known as:  CLEOCIN   clopidogrel 75 MG tablet Commonly known as:  PLAVIX   furosemide 40 MG tablet Commonly known as:  LASIX     TAKE these medications   amLODipine 10 MG tablet Commonly known as:  NORVASC Take 10 mg by mouth daily.   aspirin 81 MG EC tablet Take 1 tablet (81 mg total) by mouth daily.   atorvastatin 10 MG tablet Commonly known  as:  LIPITOR Take 1 tablet (10 mg total) by mouth daily at 6 PM.   calcitRIOL 0.25 MCG capsule Commonly known as:  ROCALTROL Take 0.25 mcg by mouth 3 (three) times daily.   cephALEXin 250 MG capsule Commonly known as:  KEFLEX Take 1 capsule (250 mg total) by mouth 2 (two) times daily for 10 days.   cloNIDine 0.2 mg/24hr patch Commonly known as:  CATAPRES - Dosed in mg/24 hr Place 0.2 mg onto the skin every Sunday.   doxycycline 100 MG capsule Commonly known as:  VIBRAMYCIN Take 1 capsule (100 mg total) by mouth 2 (two) times daily for 10 days.   Iron 28 MG Tabs Take 1 tablet by mouth.   Jalyn 0.5-0.4 MG Caps Generic drug:  Dutasteride-Tamsulosin HCl Take 1 capsule by mouth daily.   labetalol 200 MG tablet Commonly known as:  NORMODYNE Take 100 mg by mouth daily.   multivitamin capsule Take 1 capsule by mouth daily.   Tradjenta 5 MG Tabs tablet Generic drug:  linagliptin Take 5 mg by mouth daily.          Management plans  discussed with the patient and he is in agreement. Stable for discharge   Patient should follow up with pcp  CODE STATUS:     Code Status Orders  (From admission, onward)         Start     Ordered   09/05/18 1549  Do not attempt resuscitation (DNR)  Continuous    Question Answer Comment  In the event of cardiac or respiratory ARREST Do not call a "code blue"   In the event of cardiac or respiratory ARREST Do not perform Intubation, CPR, defibrillation or ACLS   In the event of cardiac or respiratory ARREST Use medication by any route, position, wound care, and other measures to relive pain and suffering. May use oxygen, suction and manual treatment of airway obstruction as needed for comfort.      03 /28/20 1549        Code Status History    Date Active Date Inactive Code Status Order ID Comments User Context   04/19/2018 1826 04/21/2018 2011 DNR [id number]  Vaughan Basta, MD Inpatient    Advance Directive Documentation     Most Recent Value  Type of Advance Directive  Healthcare Power of Attorney  Pre-existing out of facility DNR order (yellow form or pink MOST form)  -  "MOST" Form in Place?  -      TOTAL TIME TAKING CARE OF THIS PATIENT: 38 minutes.    Note: This dictation was prepared with Dragon dictation along with smaller phrase technology. Any transcriptional errors that result from this process are unintentional.  Bettey Costa M.D on 09/07/2018 at 10:52 AM  Between 7am to 6pm - Pager - [phone] After 6pm go to [email or web address] - password [email or web address] [facility]  Office  [phone]  CC: Primary care physician; [name], MD

## 2018-09-10 LAB — CULTURE, BLOOD (ROUTINE X 2)
CULTURE: NO GROWTH
Culture: NO GROWTH
SPECIAL REQUESTS: ADEQUATE

## 2019-01-19 ENCOUNTER — Emergency Department: Payer: Medicare Other

## 2019-01-19 ENCOUNTER — Encounter: Payer: Self-pay | Admitting: Emergency Medicine

## 2019-01-19 ENCOUNTER — Other Ambulatory Visit: Payer: Self-pay

## 2019-01-19 ENCOUNTER — Inpatient Hospital Stay
Admission: EM | Admit: 2019-01-19 | Discharge: 2019-01-23 | DRG: 481 | Disposition: A | Discharge status: Skilled Nursing Facility | Payer: Medicare Other | Attending: Internal Medicine | Admitting: Internal Medicine

## 2019-01-19 DIAGNOSIS — Z66 Do not resuscitate: Secondary | ICD-10-CM | POA: Diagnosis present

## 2019-01-19 DIAGNOSIS — D631 Anemia in chronic kidney disease: Secondary | ICD-10-CM | POA: Diagnosis present

## 2019-01-19 DIAGNOSIS — N400 Enlarged prostate without lower urinary tract symptoms: Secondary | ICD-10-CM | POA: Diagnosis present

## 2019-01-19 DIAGNOSIS — W1830XA Fall on same level, unspecified, initial encounter: Secondary | ICD-10-CM | POA: Diagnosis present

## 2019-01-19 DIAGNOSIS — Z794 Long term (current) use of insulin: Secondary | ICD-10-CM | POA: Diagnosis not present

## 2019-01-19 DIAGNOSIS — Z888 Allergy status to other drugs, medicaments and biological substances status: Secondary | ICD-10-CM

## 2019-01-19 DIAGNOSIS — T148XXA Other injury of unspecified body region, initial encounter: Secondary | ICD-10-CM

## 2019-01-19 DIAGNOSIS — I129 Hypertensive chronic kidney disease with stage 1 through stage 4 chronic kidney disease, or unspecified chronic kidney disease: Secondary | ICD-10-CM | POA: Diagnosis present

## 2019-01-19 DIAGNOSIS — Z7982 Long term (current) use of aspirin: Secondary | ICD-10-CM | POA: Diagnosis not present

## 2019-01-19 DIAGNOSIS — Z20828 Contact with and (suspected) exposure to other viral communicable diseases: Secondary | ICD-10-CM | POA: Diagnosis present

## 2019-01-19 DIAGNOSIS — N179 Acute kidney failure, unspecified: Secondary | ICD-10-CM

## 2019-01-19 DIAGNOSIS — W19XXXA Unspecified fall, initial encounter: Secondary | ICD-10-CM

## 2019-01-19 DIAGNOSIS — Z79899 Other long term (current) drug therapy: Secondary | ICD-10-CM | POA: Diagnosis not present

## 2019-01-19 DIAGNOSIS — Z833 Family history of diabetes mellitus: Secondary | ICD-10-CM

## 2019-01-19 DIAGNOSIS — N184 Chronic kidney disease, stage 4 (severe): Secondary | ICD-10-CM | POA: Diagnosis present

## 2019-01-19 DIAGNOSIS — Z880 Allergy status to penicillin: Secondary | ICD-10-CM

## 2019-01-19 DIAGNOSIS — I872 Venous insufficiency (chronic) (peripheral): Secondary | ICD-10-CM | POA: Diagnosis present

## 2019-01-19 DIAGNOSIS — K219 Gastro-esophageal reflux disease without esophagitis: Secondary | ICD-10-CM | POA: Diagnosis present

## 2019-01-19 DIAGNOSIS — Z01818 Encounter for other preprocedural examination: Secondary | ICD-10-CM

## 2019-01-19 DIAGNOSIS — S72041A Displaced fracture of base of neck of right femur, initial encounter for closed fracture: Principal | ICD-10-CM | POA: Diagnosis present

## 2019-01-19 DIAGNOSIS — E1122 Type 2 diabetes mellitus with diabetic chronic kidney disease: Secondary | ICD-10-CM | POA: Diagnosis present

## 2019-01-19 DIAGNOSIS — S72009A Fracture of unspecified part of neck of unspecified femur, initial encounter for closed fracture: Secondary | ICD-10-CM | POA: Diagnosis present

## 2019-01-19 DIAGNOSIS — Z800 Family history of malignant neoplasm of digestive organs: Secondary | ICD-10-CM | POA: Diagnosis not present

## 2019-01-19 DIAGNOSIS — D62 Acute posthemorrhagic anemia: Secondary | ICD-10-CM | POA: Diagnosis not present

## 2019-01-19 DIAGNOSIS — Y92008 Other place in unspecified non-institutional (private) residence as the place of occurrence of the external cause: Secondary | ICD-10-CM | POA: Diagnosis not present

## 2019-01-19 DIAGNOSIS — E785 Hyperlipidemia, unspecified: Secondary | ICD-10-CM | POA: Diagnosis present

## 2019-01-19 DIAGNOSIS — S72001A Fracture of unspecified part of neck of right femur, initial encounter for closed fracture: Secondary | ICD-10-CM

## 2019-01-19 LAB — BASIC METABOLIC PANEL
Anion gap: 13 (ref 5–15)
BUN: 79 mg/dL — ABNORMAL HIGH (ref 8–23)
CO2: 21 mmol/L — ABNORMAL LOW (ref 22–32)
Calcium: 9.3 mg/dL (ref 8.9–10.3)
Chloride: 98 mmol/L (ref 98–111)
Creatinine, Ser: 3.9 mg/dL — ABNORMAL HIGH (ref 0.61–1.24)
GFR calc Af Amer: 15 mL/min — ABNORMAL LOW (ref >60)
GFR calc non Af Amer: 13 mL/min — ABNORMAL LOW (ref >60)
Glucose, Bld: 168 mg/dL — ABNORMAL HIGH (ref 70–99)
Potassium: 4.2 mmol/L (ref 3.5–5.1)
Sodium: 132 mmol/L — ABNORMAL LOW (ref 135–145)

## 2019-01-19 LAB — CBC WITH DIFFERENTIAL/PLATELET
Abs Immature Granulocytes: 0.12 10*3/uL — ABNORMAL HIGH (ref 0.00–0.07)
Basophils Absolute: 0 10*3/uL (ref 0.0–0.1)
Basophils Relative: 0 %
Eosinophils Absolute: 0.6 10*3/uL — ABNORMAL HIGH (ref 0.0–0.5)
Eosinophils Relative: 4 %
HCT: 31.3 % — ABNORMAL LOW (ref 39.0–52.0)
Hemoglobin: 10.4 g/dL — ABNORMAL LOW (ref 13.0–17.0)
Immature Granulocytes: 1 %
Lymphocytes Relative: 8 %
Lymphs Abs: 1 10*3/uL (ref 0.7–4.0)
MCH: 31.3 pg (ref 26.0–34.0)
MCHC: 33.2 g/dL (ref 30.0–36.0)
MCV: 94.3 fL (ref 80.0–100.0)
Monocytes Absolute: 0.9 10*3/uL (ref 0.1–1.0)
Monocytes Relative: 7 %
Neutro Abs: 10.4 10*3/uL — ABNORMAL HIGH (ref 1.7–7.7)
Neutrophils Relative %: 80 %
Platelets: 131 10*3/uL — ABNORMAL LOW (ref 150–400)
RBC: 3.32 MIL/uL — ABNORMAL LOW (ref 4.22–5.81)
RDW: 12.5 % (ref 11.5–15.5)
WBC: 13.1 10*3/uL — ABNORMAL HIGH (ref 4.0–10.5)
nRBC: 0 % (ref 0.0–0.2)

## 2019-01-19 LAB — URINALYSIS, COMPLETE (UACMP) WITH MICROSCOPIC
Bacteria, UA: NONE SEEN
Bilirubin Urine: NEGATIVE
Glucose, UA: 50 mg/dL — AB
Hgb urine dipstick: NEGATIVE
Ketones, ur: NEGATIVE mg/dL
Leukocytes,Ua: NEGATIVE
Nitrite: NEGATIVE
Protein, ur: 30 mg/dL — AB
Specific Gravity, Urine: 1.008 (ref 1.005–1.030)
Squamous Epithelial / HPF: NONE SEEN (ref 0–5)
WBC, UA: NONE SEEN WBC/hpf (ref 0–5)
pH: 6 (ref 5.0–8.0)

## 2019-01-19 LAB — HEMOGLOBIN A1C
Hgb A1c MFr Bld: 7.1 % — ABNORMAL HIGH (ref 4.8–5.6)
Mean Plasma Glucose: 157.07 mg/dL

## 2019-01-19 LAB — PROTIME-INR
INR: 1 (ref 0.8–1.2)
Prothrombin Time: 13.3 seconds (ref 11.4–15.2)

## 2019-01-19 LAB — SARS CORONAVIRUS 2 BY RT PCR (HOSPITAL ORDER, PERFORMED IN ~~LOC~~ HOSPITAL LAB): SARS Coronavirus 2: NEGATIVE

## 2019-01-19 LAB — MRSA PCR SCREENING: MRSA by PCR: NEGATIVE

## 2019-01-19 LAB — GLUCOSE, CAPILLARY: Glucose-Capillary: 239 mg/dL — ABNORMAL HIGH (ref 70–99)

## 2019-01-19 MED ORDER — OXYCODONE-ACETAMINOPHEN 5-325 MG PO TABS
1.0000 | ORAL_TABLET | ORAL | Status: DC | PRN
  Administered 2019-01-19: 1 via ORAL
  Filled 2019-01-19: qty 1

## 2019-01-19 MED ORDER — ASPIRIN EC 81 MG PO TBEC
81.0000 mg | DELAYED_RELEASE_TABLET | Freq: Every day | ORAL | Status: DC
  Administered 2019-01-21 – 2019-01-23 (×3): 81 mg via ORAL
  Filled 2019-01-19 (×3): qty 1

## 2019-01-19 MED ORDER — ADULT MULTIVITAMIN W/MINERALS CH
1.0000 | ORAL_TABLET | Freq: Every day | ORAL | Status: DC
  Administered 2019-01-21 – 2019-01-23 (×3): 1 via ORAL
  Filled 2019-01-19 (×3): qty 1

## 2019-01-19 MED ORDER — AMLODIPINE BESYLATE 10 MG PO TABS
10.0000 mg | ORAL_TABLET | Freq: Every day | ORAL | Status: DC
  Administered 2019-01-21 – 2019-01-23 (×3): 10 mg via ORAL
  Filled 2019-01-19 (×3): qty 1

## 2019-01-19 MED ORDER — SODIUM CHLORIDE 0.9 % IV SOLN
INTRAVENOUS | Status: DC
  Administered 2019-01-19 – 2019-01-20 (×4): via INTRAVENOUS

## 2019-01-19 MED ORDER — CALCITRIOL 0.25 MCG PO CAPS
0.2500 ug | ORAL_CAPSULE | Freq: Every day | ORAL | Status: DC
  Administered 2019-01-21 – 2019-01-23 (×3): 0.25 ug via ORAL
  Filled 2019-01-19 (×4): qty 1

## 2019-01-19 MED ORDER — DUTASTERIDE 0.5 MG PO CAPS
0.5000 mg | ORAL_CAPSULE | Freq: Every day | ORAL | Status: DC
  Administered 2019-01-19 – 2019-01-23 (×4): 0.5 mg via ORAL
  Filled 2019-01-19 (×5): qty 1

## 2019-01-19 MED ORDER — LOSARTAN POTASSIUM 25 MG PO TABS
12.5000 mg | ORAL_TABLET | Freq: Two times a day (BID) | ORAL | Status: DC
  Administered 2019-01-19 – 2019-01-23 (×7): 12.5 mg via ORAL
  Filled 2019-01-19 (×7): qty 1

## 2019-01-19 MED ORDER — MORPHINE SULFATE (PF) 4 MG/ML IV SOLN
4.0000 mg | Freq: Once | INTRAVENOUS | Status: AC
  Administered 2019-01-19: 4 mg via INTRAVENOUS
  Filled 2019-01-19: qty 1

## 2019-01-19 MED ORDER — RENA-VITE PO TABS
1.0000 | ORAL_TABLET | Freq: Every day | ORAL | Status: DC
  Administered 2019-01-21 – 2019-01-23 (×3): 1 via ORAL
  Filled 2019-01-19 (×4): qty 1

## 2019-01-19 MED ORDER — INSULIN ASPART 100 UNIT/ML ~~LOC~~ SOLN
0.0000 [IU] | Freq: Three times a day (TID) | SUBCUTANEOUS | Status: DC
  Administered 2019-01-21 (×2): 2 [IU] via SUBCUTANEOUS
  Administered 2019-01-21 – 2019-01-22 (×2): 5 [IU] via SUBCUTANEOUS
  Administered 2019-01-22: 1 [IU] via SUBCUTANEOUS
  Administered 2019-01-22: 2 [IU] via SUBCUTANEOUS
  Administered 2019-01-23: 1 [IU] via SUBCUTANEOUS
  Administered 2019-01-23: 2 [IU] via SUBCUTANEOUS
  Filled 2019-01-19 (×8): qty 1

## 2019-01-19 MED ORDER — DUTASTERIDE-TAMSULOSIN HCL 0.5-0.4 MG PO CAPS: 1.0000 | ORAL_CAPSULE | Freq: Every day | ORAL | Status: DC

## 2019-01-19 MED ORDER — DOCUSATE SODIUM 100 MG PO CAPS: 100.0000 mg | ORAL_CAPSULE | Freq: Two times a day (BID) | ORAL | Status: DC | PRN

## 2019-01-19 MED ORDER — SODIUM BICARBONATE 650 MG PO TABS
650.0000 mg | ORAL_TABLET | Freq: Two times a day (BID) | ORAL | Status: DC
  Administered 2019-01-19 – 2019-01-23 (×7): 650 mg via ORAL
  Filled 2019-01-19 (×9): qty 1

## 2019-01-19 MED ORDER — INSULIN ASPART 100 UNIT/ML ~~LOC~~ SOLN
0.0000 [IU] | Freq: Every day | SUBCUTANEOUS | Status: DC
  Administered 2019-01-19: 21:00:00 2 [IU] via SUBCUTANEOUS
  Filled 2019-01-19: qty 1

## 2019-01-19 MED ORDER — PANTOPRAZOLE SODIUM 40 MG PO TBEC
40.0000 mg | DELAYED_RELEASE_TABLET | Freq: Every day | ORAL | Status: DC
  Administered 2019-01-21 – 2019-01-23 (×3): 40 mg via ORAL
  Filled 2019-01-19 (×3): qty 1

## 2019-01-19 MED ORDER — TAMSULOSIN HCL 0.4 MG PO CAPS
0.4000 mg | ORAL_CAPSULE | Freq: Every day | ORAL | Status: DC
  Administered 2019-01-19 – 2019-01-23 (×4): 0.4 mg via ORAL
  Filled 2019-01-19 (×4): qty 1

## 2019-01-19 MED ORDER — FERROUS SULFATE 325 (65 FE) MG PO TABS
325.0000 mg | ORAL_TABLET | Freq: Every morning | ORAL | Status: DC
  Administered 2019-01-21 – 2019-01-23 (×3): 325 mg via ORAL
  Filled 2019-01-19 (×3): qty 1

## 2019-01-19 NOTE — H&P (Signed)
[facility] [place] NAME: [name]    MR#:  [id number]  DATE OF BIRTH:  1931/06/20  DATE OF ADMISSION:  01/19/2019  PRIMARY CARE PHYSICIAN: [name], MD   REQUESTING/REFERRING PHYSICIAN: Donald Pore  CHIEF COMPLAINT:   Chief Complaint  Patient presents with  . Fall    HISTORY OF PRESENT ILLNESS: [name]  is a 85 y.o. male with a known history of chronic kidney disease stage IV, diabetes without complication, heart murmur, hyperlipidemia, hypertension, retinal detachment-lives at home and independent in day-to-day activities does not require cane to walk around. Today he was out in the yard for long time he felt he stayed out in standing for very long so when he was trying to turn back he lost his balance suddenly and fell down.  He denies any dizziness, palpitation, shortness of breath, loss of consciousness, vertigo. Since fall he started hurting in his right hip and brought to emergency room where he was noted to have femoral neck fracture. He was also noted to have slight worsening in his renal function compared to his baseline.  ER physician suggested to admit to hospitalist team for further management.  PAST MEDICAL HISTORY:   Past Medical History:  Diagnosis Date  . Cancer ([place])   . Chronic kidney disease   . Colon polyp   . Diabetes mellitus without complication ([place])   . Heart murmur 2008  . Hyperlipidemia   . Hypertension 1980  . Retinal detachment     PAST SURGICAL HISTORY:  Past Surgical History:  Procedure Laterality Date  . COLONOSCOPY  2011,06/23/13   Dr Bary Castilla  . COLONOSCOPY  06-28-2011  . COLONOSCOPY WITH PROPOFOL N/A 08/30/2015   Procedure: COLONOSCOPY WITH PROPOFOL;  Surgeon: [name], MD;  Location: [facility] ENDOSCOPY;  Service: Endoscopy;  Laterality: N/A;  . EYE SURGERY    . HERNIA REPAIR    . LOWER EXTREMITY ANGIOGRAPHY Right 04/20/2018   Procedure: Lower Extremity Angiography;  Surgeon: Algernon Huxley, MD;  Location: [facility];  Service: Cardiovascular;  Laterality: Right;  . POLYPECTOMY  06-23-13  . ]      SOCIAL HISTORY:  Social History   Tobacco Use  . Smoking status: Never Smoker  . Smokeless tobacco: Never Used  Substance Use Topics  . Alcohol use: No    FAMILY HISTORY:  Family History  Problem Relation Age of Onset  . Colon cancer Mother   . Diabetes Father   . Diabetes Brother   . Stroke Brother   . Diabetes Son     DRUG ALLERGIES:  Allergies  Allergen Reactions  . Meperidine Other (See Comments)    Severe bradycardia Severe bradycardia   . Penicillins Rash    REVIEW OF SYSTEMS:   CONSTITUTIONAL: No fever, fatigue or weakness.  EYES: No blurred or double vision.  EARS, NOSE, AND THROAT: No tinnitus or ear pain.  RESPIRATORY: No cough, shortness of breath, wheezing or hemoptysis.  CARDIOVASCULAR: No chest pain, orthopnea, edema.  GASTROINTESTINAL: No nausea, vomiting, diarrhea or abdominal pain.  GENITOURINARY: No dysuria, hematuria.  ENDOCRINE: No polyuria, nocturia,  HEMATOLOGY: No anemia, easy bruising or bleeding SKIN: No rash or lesion. MUSCULOSKELETAL: Right hip joint pain.   NEUROLOGIC: No tingling, numbness, weakness.  PSYCHIATRY: No anxiety or depression.   MEDICATIONS AT HOME:  Prior to Admission medications   Medication Sig Start Date End Date Taking? Authorizing Provider  amLODipine (NORVASC) 10 MG tablet Take 10 mg  by mouth daily.  05/28/13  Yes [provider]  aspirin EC 81 MG EC tablet Take 1 tablet (81 mg total) by mouth daily. 04/22/18  Yes Max Sane, MD  B-Complex TABS Take 1 tablet by mouth daily.   Yes [provider]  calcitRIOL (ROCALTROL) 0.25 MCG capsule Take 0.25 mcg by mouth daily.  07/13/18  Yes [provider]  carvedilol (COREG) 12.5 MG tablet Take 12.5 mg by mouth 2 (two) times daily with a meal.   Yes [provider]  Ferrous Sulfate (IRON) 28 MG TABS Take 1 tablet  by mouth.   Yes [provider]  furosemide (LASIX) 20 MG tablet Take 10-40 mg by mouth 2 (two) times daily. Take 40 mg by mouth in the morning, then 10 mg by mouth at lunch   Yes [provider]  JALYN 0.5-0.4 MG CAPS Take 1 capsule by mouth daily.    Yes [provider]  linagliptin (TRADJENTA) 5 MG TABS tablet Take 5 mg by mouth daily.   Yes [provider]  losartan (COZAAR) 25 MG tablet Take 15 mg by mouth 2 (two) times daily.   Yes [provider]  Multiple Vitamin (MULTIVITAMIN) capsule Take 1 capsule by mouth daily.   Yes [provider]  sodium bicarbonate 650 MG tablet Take 650 mg by mouth 2 (two) times daily.   Yes [provider]      PHYSICAL EXAMINATION:   VITAL SIGNS: Blood pressure (!) 139/58, pulse (!) 58, temperature (!) 97.5 F (36.4 C), temperature source Oral, resp. rate 16, height 5\' 11"  (1.803 m), weight 70.3 kg, SpO2 98 %.  GENERAL:  85 y.o.-year-old patient lying in the bed with no acute distress.  EYES: Pupils equal, round, reactive to light and accommodation. No scleral icterus. Extraocular muscles intact.  HEENT: Head atraumatic, normocephalic. Oropharynx and nasopharynx clear.  NECK:  Supple, no jugular venous distention. No thyroid enlargement, no tenderness.  LUNGS: Normal breath sounds bilaterally, no wheezing, rales,rhonchi or crepitation. No use of accessory muscles of respiration.  CARDIOVASCULAR: S1, S2 normal. No murmurs, rubs, or gallops.  ABDOMEN: Soft, nontender, nondistended. Bowel sounds present. No organomegaly or mass.  EXTREMITIES: No pedal edema, cyanosis, or clubbing.  Right leg is externally rotated and tender in hip joint. NEUROLOGIC: Cranial nerves II through XII are intact. Muscle strength 4/5 in all extremities except for right lower extremity is not moving because of pain in hip. Sensation intact. Gait not checked.  PSYCHIATRIC: The patient is alert and oriented x 3.  SKIN: No  obvious rash, lesion, or ulcer.   LABORATORY PANEL:   CBC Recent Labs  Lab 01/19/19 1516  WBC 13.1*  HGB 10.4*  HCT 31.3*  PLT 131*  MCV 94.3  MCH 31.3  MCHC 33.2  RDW 12.5  LYMPHSABS 1.0  MONOABS 0.9  EOSABS 0.6*  BASOSABS 0.0   ------------------------------------------------------------------------------------------------------------------  Chemistries  Recent Labs  Lab 01/19/19 1516  NA 132*  K 4.2  CL 98  CO2 21*  GLUCOSE 168*  BUN 79*  CREATININE 3.90*  CALCIUM 9.3   ------------------------------------------------------------------------------------------------------------------ estimated creatinine clearance is 14 mL/min (A) (by C-G formula based on SCr of 3.9 mg/dL (H)). ------------------------------------------------------------------------------------------------------------------ No results for input(s): TSH, T4TOTAL, T3FREE, THYROIDAB in the last 72 hours.  Invalid input(s): FREET3   Coagulation profile Recent Labs  Lab 01/19/19 1645  INR 1.0   ------------------------------------------------------------------------------------------------------------------- No results for input(s): DDIMER in the last 72 hours. -------------------------------------------------------------------------------------------------------------------  Cardiac Enzymes No results for  input(s): CKMB, TROPONINI, MYOGLOBIN in the last 168 hours.  Invalid input(s): CK ------------------------------------------------------------------------------------------------------------------ Invalid input(s): POCBNP  ---------------------------------------------------------------------------------------------------------------  Urinalysis No results found for: COLORURINE, APPEARANCEUR, LABSPEC, PHURINE, GLUCOSEU, HGBUR, BILIRUBINUR, KETONESUR, PROTEINUR, UROBILINOGEN, NITRITE, LEUKOCYTESUR   RADIOLOGY: Dg Chest 1 View  Result Date: 01/19/2019 CLINICAL DATA:  Hip fracture.  EXAM: CHEST  1 VIEW COMPARISON:  None. FINDINGS: The heart size and mediastinal contours are within normal limits. Both lungs are clear. The visualized skeletal structures are unremarkable. IMPRESSION: No active disease. Electronically Signed   By: Dorise Bullion III M.D   On: 01/19/2019 16:05   Ct Head Wo Contrast  Result Date: 01/19/2019 CLINICAL DATA:  Head trauma, fall in the driveway EXAM: CT HEAD WITHOUT CONTRAST; CT CERVICAL SPINE WITHOUT CONTRAST TECHNIQUE: Contiguous axial images were obtained from the base of the skull through the vertex without intravenous contrast. COMPARISON:  None. FINDINGS: Brain: No evidence of acute territorial infarction, hemorrhage, hydrocephalus,extra-axial collection or mass lesion/mass effect. There is dilatation the ventricles and sulci consistent with age-related atrophy. Low-attenuation changes in the deep white matter consistent with small vessel ischemia. Vascular: No hyperdense vessel. Calcifications are seen within the internal carotid, vertebral artery, and basilar artery. Skull: The skull is intact. No fracture or focal lesion identified. Sinuses/Orbits: The visualized paranasal sinuses and mastoid air cells are clear. The globes appear intact, however there is coarse calcification seen along the left globe. Other: None Cervical spine: Alignment: There is slight reversal of the normal cervical lordosis. A grade 1 anterolisthesis of C3 on C4 is seen measuring 3 mm. A minimal retrolisthesis of C4 on C5 and C5 on C6 is seen. Skull base and vertebrae: Visualized skull base is intact. No atlanto-occipital dissociation. The vertebral body heights are well maintained. No fracture or pathologic osseous lesion seen. Soft tissues and spinal canal: The visualized paraspinal soft tissues are unremarkable. No prevertebral soft tissue swelling is seen. The spinal canal is grossly unremarkable, no large epidural collection or significant canal narrowing. Disc levels: Multilevel  degenerative changes are seen with disc osteophyte complex and uncovertebral osteophytosis is most notable at C5-C6 and C6-C7. Upper chest: The lung apices are clear. Densely calcified carotid arteries are seen. Other: None IMPRESSION: 1. No acute intracranial abnormality. 2. Findings consistent with age related atrophy and chronic small vessel ischemia 3. Coarse calcifications along the posterior left globe. 4. No acute fracture or malalignment of the cervical spine. 5. Grade 1 anterolisthesis of C3 on C4 with multilevel degenerative changes. Electronically Signed   By: Prudencio Pair M.D.   On: 01/19/2019 16:02   Ct Cervical Spine Wo Contrast  Result Date: 01/19/2019 CLINICAL DATA:  Head trauma, fall in the driveway EXAM: CT HEAD WITHOUT CONTRAST; CT CERVICAL SPINE WITHOUT CONTRAST TECHNIQUE: Contiguous axial images were obtained from the base of the skull through the vertex without intravenous contrast. COMPARISON:  None. FINDINGS: Brain: No evidence of acute territorial infarction, hemorrhage, hydrocephalus,extra-axial collection or mass lesion/mass effect. There is dilatation the ventricles and sulci consistent with age-related atrophy. Low-attenuation changes in the deep white matter consistent with small vessel ischemia. Vascular: No hyperdense vessel. Calcifications are seen within the internal carotid, vertebral artery, and basilar artery. Skull: The skull is intact. No fracture or focal lesion identified. Sinuses/Orbits: The visualized paranasal sinuses and mastoid air cells are clear. The globes appear intact, however there is coarse calcification seen along the left globe. Other: None Cervical spine: Alignment: There is slight reversal of the normal cervical lordosis. A grade 1 anterolisthesis of  C3 on C4 is seen measuring 3 mm. A minimal retrolisthesis of C4 on C5 and C5 on C6 is seen. Skull base and vertebrae: Visualized skull base is intact. No atlanto-occipital dissociation. The vertebral body  heights are well maintained. No fracture or pathologic osseous lesion seen. Soft tissues and spinal canal: The visualized paraspinal soft tissues are unremarkable. No prevertebral soft tissue swelling is seen. The spinal canal is grossly unremarkable, no large epidural collection or significant canal narrowing. Disc levels: Multilevel degenerative changes are seen with disc osteophyte complex and uncovertebral osteophytosis is most notable at C5-C6 and C6-C7. Upper chest: The lung apices are clear. Densely calcified carotid arteries are seen. Other: None IMPRESSION: 1. No acute intracranial abnormality. 2. Findings consistent with age related atrophy and chronic small vessel ischemia 3. Coarse calcifications along the posterior left globe. 4. No acute fracture or malalignment of the cervical spine. 5. Grade 1 anterolisthesis of C3 on C4 with multilevel degenerative changes. Electronically Signed   By: Prudencio Pair M.D.   On: 01/19/2019 16:02   Dg Hip Unilat W Or Wo Pelvis 2-3 Views Right  Result Date: 01/19/2019 CLINICAL DATA:  Pain after fall. EXAM: DG HIP (WITH OR WITHOUT PELVIS) 2-3V RIGHT COMPARISON:  None. FINDINGS: There is a displaced fracture through the right femoral neck. Mild degenerative changes seen in the right hip. No dislocation. IMPRESSION: Displaced fracture through the right femoral neck resulting in foreshortening of the right lower extremity. Electronically Signed   By: Dorise Bullion III M.D   On: 01/19/2019 16:05    EKG: Orders placed or performed during the hospital encounter of 01/19/19  . EKG 12-Lead  . EKG 12-Lead    IMPRESSION AND PLAN:  *Right hip fracture Avoid anticoagulation as advised by orthopedic team. We will keep on SCDs. Pain management as needed with oral medications. Seen by orthopedic team and plan for surgery tomorrow. Patient is having history of diabetes and hypertension but does not have any cardiac history.  He walks 10th of a mile every day and  lives independently life.  He is medically in optimal condition to proceed for surgery tomorrow without any further interventions.  He is at moderate risk because of his overall age and medical issues. Due to his chronic renal failure I advise to avoid fluid overload during surgery.  *Hypertension I will continue his home medications. Hold beta-blocker as his heart rate is running in high 50s. Hold Lasix due to worsening in renal function.  *Diabetes Hold linagliptin as patient would be n.p.o. and going for surgery tomorrow. I will keep on sliding scale coverage.  *Acute worsening of chronic kidney disease stage IV Baseline creatinine seems to be around 2.5- 2.7 Hold diuretics, IV fluids and monitor renal function. Avoid fluid overload.  *Elevated white blood cell count Patient had accidental fall and pain , that can cause elevated white blood cell count I will check UA.  His chest x-ray negative and COVID-19 test is pending.  All the records are reviewed and case discussed with ED provider. Management plans discussed with the patient, family and they are in agreement.  CODE STATUS: DNR Code Status History    Date Active Date Inactive Code Status Order ID Comments User Context   09/05/2018 8144 09/07/2018 1552 DNR [id number]  Sela Hua, MD Inpatient   04/19/2018 1826 04/21/2018 2011 DNR [id number]  Vaughan Basta, MD Inpatient   Advance Care Planning Activity    Questions for Most Recent Historical Code Status (Order [id number])  Question Answer Comment   In the event of cardiac or respiratory ARREST Do not call a "code blue"    In the event of cardiac or respiratory ARREST Do not perform Intubation, CPR, defibrillation or ACLS    In the event of cardiac or respiratory ARREST Use medication by any route, position, wound care, and other measures to relive pain and suffering. May use oxygen, suction and manual treatment of airway obstruction as needed for comfort.         TOTAL TIME TAKING CARE OF THIS PATIENT: 50 minutes.  Patient's son was present in the room during my visit.  Vaughan Basta M.D on 01/19/2019   Between 7am to 6pm - Pager - [phone]  After 6pm go to [email or web address] - password [email or web address] [facility]  Office  [phone]  CC: Primary care physician; [name], MD   Note: This dictation was prepared with Dragon dictation along with smaller phrase technology. Any transcriptional errors that result from this process are unintentional.

## 2019-01-19 NOTE — ED Notes (Signed)
Patient transported to CT 

## 2019-01-19 NOTE — ED Provider Notes (Signed)
[facility] Emergency Department Provider Note   ____________________________________________   First MD Initiated Contact with Patient 01/19/19 1501     (approximate)  I have reviewed the triage vital signs and the nursing notes.   HISTORY  Chief Complaint Fall    HPI [name] is a 85 y.o. male with past medical history of hypertension and diabetes who presents to the ED following a fall.  Patient states that his right leg seemed to give out and he fell to the ground onto his right hip.  He does not think he hit his head or lost consciousness, denies any headache or neck pain.  He now complains of pain to his right hip, especially with any movement of his right leg.  He denies pain in either of his upper extremities, chest, or abdomen.  He is not anticoagulated.        Past Medical History:  Diagnosis Date  . Cancer ([place])   . Chronic kidney disease   . Colon polyp   . Diabetes mellitus without complication ([place])   . Heart murmur 2008  . Hyperlipidemia   . Hypertension 1980  . Retinal detachment     Patient Active Problem List   Diagnosis Date Noted  . Closed right hip fracture ([place]) 01/19/2019  . Hip fracture ([place]) 01/19/2019  . Left arm cellulitis 09/05/2018  . Lymphedema 07/27/2018  . Chronic venous insufficiency 07/27/2018  . Ulcers of both lower legs, limited to breakdown of skin ([place]) 06/29/2018  . Peripheral arterial disease ([place]) 05/29/2018  . DM2 (diabetes mellitus, type 2) ([place]) 04/04/2018  . HTN (hypertension) 04/04/2018  . History of colonic polyps 07/14/2015    Past Surgical History:  Procedure Laterality Date  . COLONOSCOPY  2011,06/23/13   Dr Bary Castilla  . COLONOSCOPY  06-28-2011  . COLONOSCOPY WITH PROPOFOL N/A 08/30/2015   Procedure: COLONOSCOPY WITH PROPOFOL;  Surgeon: [name], MD;  Location: [facility] ENDOSCOPY;  Service: Endoscopy;  Laterality: N/A;  . EYE SURGERY    . HERNIA REPAIR    . LOWER EXTREMITY  ANGIOGRAPHY Right 04/20/2018   Procedure: Lower Extremity Angiography;  Surgeon: Algernon Huxley, MD;  Location: [facility];  Service: Cardiovascular;  Laterality: Right;  . POLYPECTOMY  06-23-13  . ]      Prior to Admission medications   Medication Sig Start Date End Date Taking? Authorizing Provider  amLODipine (NORVASC) 10 MG tablet Take 10 mg by mouth daily.  05/28/13  Yes [provider]  aspirin EC 81 MG EC tablet Take 1 tablet (81 mg total) by mouth daily. 04/22/18  Yes Max Sane, MD  B-Complex TABS Take 1 tablet by mouth daily.   Yes [provider]  calcitRIOL (ROCALTROL) 0.25 MCG capsule Take 0.25 mcg by mouth daily.  07/13/18  Yes [provider]  carvedilol (COREG) 12.5 MG tablet Take 12.5 mg by mouth 2 (two) times daily with a meal.   Yes [provider]  Ferrous Sulfate (IRON) 28 MG TABS Take 1 tablet by mouth.   Yes [provider]  furosemide (LASIX) 20 MG tablet Take 10-40 mg by mouth 2 (two) times daily. Take 40 mg by mouth in the morning, then 10 mg by mouth at lunch   Yes [provider]  JALYN 0.5-0.4 MG CAPS Take 1 capsule by mouth daily.    Yes [provider]  linagliptin (TRADJENTA) 5 MG TABS tablet Take 5 mg by mouth daily.   Yes  [provider]  losartan (COZAAR) 25 MG tablet Take 15 mg by mouth 2 (two) times daily.   Yes [provider]  Multiple Vitamin (MULTIVITAMIN) capsule Take 1 capsule by mouth daily.   Yes [provider]  sodium bicarbonate 650 MG tablet Take 650 mg by mouth 2 (two) times daily.   Yes [provider]    Allergies Meperidine and Penicillins  Family History  Problem Relation Age of Onset  . Colon cancer Mother   . Diabetes Father   . Diabetes Brother   . Stroke Brother   . Diabetes Son     Social History Social History   Tobacco Use  . Smoking status: Never Smoker  . Smokeless tobacco: Never Used  Substance Use Topics  .  Alcohol use: No  . Drug use: No    Review of Systems  Constitutional: No fever/chills Eyes: No visual changes. ENT: No sore throat. Cardiovascular: Denies chest pain. Respiratory: Denies shortness of breath. Gastrointestinal: No abdominal pain.  No nausea, no vomiting.  No diarrhea.  No constipation. Genitourinary: Negative for dysuria. Musculoskeletal: Negative for back pain.  Positive for right hip pain. Skin: Negative for rash. Neurological: Negative for headaches, focal weakness or numbness.  ____________________________________________   PHYSICAL EXAM:  VITAL SIGNS: ED Triage Vitals [01/19/19 1501]  Enc Vitals Group     BP      Pulse      Resp      Temp      Temp src      SpO2 98 %     Weight      Height      Head Circumference      Peak Flow      Pain Score      Pain Loc      Pain Edu?      Excl. in [place]?     Constitutional: Alert and oriented. Eyes: Conjunctivae are normal. Head: Atraumatic. Nose: No congestion/rhinnorhea. Mouth/Throat: Mucous membranes are moist. Neck: Normal ROM, no midline cervical spine tenderness Cardiovascular: Normal rate, regular rhythm. Grossly normal heart sounds.  2+ DP pulses bilaterally. Respiratory: Normal respiratory effort.  No retractions. Lungs CTAB. Gastrointestinal: Soft and nontender. No distention. Genitourinary: deferred Musculoskeletal: No lower extremity tenderness nor edema.  Tenderness diffusely to right hip, no tenderness to left hip, bilateral knees, bilateral ankles, or bilateral feet.  Range of motion limited at right hip, otherwise intact. Neurologic:  Normal speech and language. No gross focal neurologic deficits are appreciated. Skin:  Skin is warm, dry and intact. No rash noted. Psychiatric: Mood and affect are normal. Speech and behavior are normal.  ____________________________________________   LABS (all labs ordered are listed, but only abnormal results are displayed)  Labs Reviewed  CBC WITH  DIFFERENTIAL/PLATELET - Abnormal; Notable for the following components:      Result Value   WBC 13.1 (*)    RBC 3.32 (*)    Hemoglobin 10.4 (*)    HCT 31.3 (*)    Platelets 131 (*)    Neutro Abs 10.4 (*)    Eosinophils Absolute 0.6 (*)    Abs Immature Granulocytes 0.12 (*)    All other components within normal limits  BASIC METABOLIC PANEL - Abnormal; Notable for the following components:   Sodium 132 (*)    CO2 21 (*)    Glucose, Bld 168 (*)    BUN 79 (*)    Creatinine, Ser 3.90 (*)    GFR calc non Af Wyvonnia Lora  13 (*)    GFR calc Af Amer 15 (*)    All other components within normal limits  URINALYSIS, COMPLETE (UACMP) WITH MICROSCOPIC - Abnormal; Notable for the following components:   Color, Urine STRAW (*)    APPearance CLEAR (*)    Glucose, UA 50 (*)    Protein, ur 30 (*)    All other components within normal limits  HEMOGLOBIN A1C - Abnormal; Notable for the following components:   Hgb A1c MFr Bld 7.1 (*)    All other components within normal limits  GLUCOSE, CAPILLARY - Abnormal; Notable for the following components:   Glucose-Capillary 239 (*)    All other components within normal limits  SARS CORONAVIRUS 2 (HOSPITAL ORDER, [place] [facility])  MRSA PCR SCREENING  PROTIME-INR  BASIC METABOLIC PANEL  CBC   ____________________________________________  EKG  ED ECG REPORT I, Blake Divine, the attending physician, personally viewed and interpreted this ECG.   Date: 01/19/2019  EKG Time: 14:59  Rate: 58  Rhythm: sinus bradycardia  Axis: Normal  Intervals:none  ST&T Change: None    PROCEDURES  Procedure(s) performed (including Critical Care):  Procedures   ____________________________________________   INITIAL IMPRESSION / ASSESSMENT AND PLAN / ED COURSE       85 year old male with history of hypertension and diabetes presenting to the ED following fall at home, found on the ground by neighbors with difficulty moving right hip  secondary to pain.  Right hip does appear deformed with shortening and external rotation, will check x-ray.  Mechanism fall appears to be mechanical, EKG without arrhythmogenic changes.  Head CT and CT C-spine negative for acute process.  X-ray confirms femoral neck fracture on the right, patient remains neurovascularly intact.  Labs significant for AKI on chronic kidney disease.  Case discussed with orthopedic surgery, who request patient be made n.p.o. after midnight and will plan for operative repair in the morning.  Case discussed with hospitalist, who accepts patient for admission.      ____________________________________________   FINAL CLINICAL IMPRESSION(S) / ED DIAGNOSES  Final diagnoses:  Closed fracture of neck of right femur, initial encounter ([place])  Fall, initial encounter  AKI (acute kidney injury) [facility])     ED Discharge Orders    None       Note:  This document was prepared using Dragon voice recognition software and may include unintentional dictation errors.   Blake Divine, MD 01/19/19 2159

## 2019-01-19 NOTE — Progress Notes (Signed)
Family Meeting Note  Advance Directive:yes  Today a meeting took place with the Patient and son.   The following clinical team members were present during this meeting:MD  The following were discussed:Patient's diagnosis: Hip fracture, hypertension, diabetes, chronic renal failure, Patient's progosis: Unable to determine and Goals for treatment: DNR  Additional follow-up to be provided: Orthopedics  Time spent during discussion:20 minutes  Vaughan Basta, MD

## 2019-01-19 NOTE — Progress Notes (Signed)
Full consult note and discussion with patient to follow tomorrow.  Called by ED staff. Imaging reviewed.  - Plan for surgery tomorrow, likely afternoon around 2pm.  - NPO after midnight - Hold anticoagulation - Admit to Hospitalist team. Please medically optimize.

## 2019-01-19 NOTE — ED Notes (Signed)
ED TO INPATIENT HANDOFF REPORT  ED Nurse Name and Phone #: Annie Main [phone]  S Name/Age/Gender [name] 85 y.o. male Room/Bed: ED14A/ED14A  Code Status   Code Status: Prior  Home/SNF/Other Home Patient oriented to: self, place, time and situation Is this baseline? Yes   Triage Complete: Triage complete  Chief Complaint fall  Triage Note Pt fell at home in driveway was on the ground for 15-20 minutes before neighbors found him. Pt c/o right hip pain. External rotation and shortening of the right leg noted on arrival. Presents AxO x4, denies hitting head or loss of conciousness.   Allergies Allergies  Allergen Reactions  . Meperidine Other (See Comments)    Severe bradycardia Severe bradycardia   . Penicillins Rash    Level of Care/Admitting Diagnosis ED Disposition    ED Disposition Condition [facility]: [place] [100120]  Level of Care: Med-Surg [16]  Covid Evaluation: Asymptomatic Screening Protocol (No Symptoms)  Diagnosis: Hip fracture [facility]) [[id number]]  Admitting Physician: Vaughan Basta [phone]  Attending Physician: Vaughan Basta [phone]  Estimated length of stay: past midnight tomorrow  Certification:: I certify this patient will need inpatient services for at least 2 midnights  PT Class (Do Not Modify): Inpatient [101]  PT Acc Code (Do Not Modify): Private [1]       B Medical/Surgery History Past Medical History:  Diagnosis Date  . Cancer ([place])   . Chronic kidney disease   . Colon polyp   . Diabetes mellitus without complication ([place])   . Heart murmur 2008  . Hyperlipidemia   . Hypertension 1980  . Retinal detachment    Past Surgical History:  Procedure Laterality Date  . COLONOSCOPY  2011,06/23/13   Dr Bary Castilla  . COLONOSCOPY  06-28-2011  . COLONOSCOPY WITH PROPOFOL N/A 08/30/2015   Procedure: COLONOSCOPY WITH PROPOFOL;  Surgeon: [name], MD;  Location: [facility] ENDOSCOPY;   Service: Endoscopy;  Laterality: N/A;  . EYE SURGERY    . HERNIA REPAIR    . LOWER EXTREMITY ANGIOGRAPHY Right 04/20/2018   Procedure: Lower Extremity Angiography;  Surgeon: Algernon Huxley, MD;  Location: [place] CV LAB;  Service: Cardiovascular;  Laterality: Right;  . POLYPECTOMY  06-23-13  . ]       A IV Location/Drains/Wounds Patient Lines/Drains/Airways Status   Active Line/Drains/Airways    Name:   Placement date:   Placement time:   Site:   Days:   Peripheral IV 01/19/19 Right Forearm   01/19/19    1507    Forearm   less than 1          Intake/Output Last 24 hours No intake or output data in the 24 hours ending 01/19/19 1843  Labs/Imaging Results for orders placed or performed during the hospital encounter of 01/19/19 (from the past 48 hour(s))  CBC with Differential     Status: Abnormal   Collection Time: 01/19/19  3:16 PM  Result Value Ref Range   WBC 13.1 (H) 4.0 - 10.5 K/uL   RBC 3.32 (L) 4.22 - 5.81 MIL/uL   Hemoglobin 10.4 (L) 13.0 - 17.0 g/dL   HCT 31.3 (L) 39.0 - 52.0 %   MCV 94.3 80.0 - 100.0 fL   MCH 31.3 26.0 - 34.0 pg   MCHC 33.2 30.0 - 36.0 g/dL   RDW 12.5 11.5 - 15.5 %   Platelets 131 (L) 150 - 400 K/uL   nRBC 0.0 0.0 - 0.2 %  Neutrophils Relative % 80 %   Neutro Abs 10.4 (H) 1.7 - 7.7 K/uL   Lymphocytes Relative 8 %   Lymphs Abs 1.0 0.7 - 4.0 K/uL   Monocytes Relative 7 %   Monocytes Absolute 0.9 0.1 - 1.0 K/uL   Eosinophils Relative 4 %   Eosinophils Absolute 0.6 (H) 0.0 - 0.5 K/uL   Basophils Relative 0 %   Basophils Absolute 0.0 0.0 - 0.1 K/uL   Immature Granulocytes 1 %   Abs Immature Granulocytes 0.12 (H) 0.00 - 0.07 K/uL    Comment: Performed at [facility], [address], [place], [place] [address]  Basic metabolic panel     Status: Abnormal   Collection Time: 01/19/19  3:16 PM  Result Value Ref Range   Sodium 132 (L) 135 - 145 mmol/L   Potassium 4.2 3.5 - 5.1 mmol/L   Chloride 98 98 - 111 mmol/L   CO2 21 (L) 22 - 32  mmol/L   Glucose, Bld 168 (H) 70 - 99 mg/dL   BUN 79 (H) 8 - 23 mg/dL   Creatinine, Ser 3.90 (H) 0.61 - 1.24 mg/dL   Calcium 9.3 8.9 - 10.3 mg/dL   GFR calc non Af Amer 13 (L) >60 mL/min   GFR calc Af Amer 15 (L) >60 mL/min   Anion gap 13 5 - 15    Comment: Performed at [facility], [address] [place], [place] [address]  Protime-INR     Status: None   Collection Time: 01/19/19  4:45 PM  Result Value Ref Range   Prothrombin Time 13.3 11.4 - 15.2 seconds   INR 1.0 0.8 - 1.2    Comment: (NOTE) INR goal varies based on device and disease states. Performed at [facility], [address] [place], [place] [address]   SARS Coronavirus 2 [facility] order, Performed in [facility] hospital lab) Nasopharyngeal Nasopharyngeal Swab     Status: None   Collection Time: 01/19/19  5:37 PM   Specimen: Nasopharyngeal Swab  Result Value Ref Range   SARS Coronavirus 2 NEGATIVE NEGATIVE    Comment: (NOTE) If result is NEGATIVE SARS-CoV-2 target nucleic acids are NOT DETECTED. The SARS-CoV-2 RNA is generally detectable in upper and lower  respiratory specimens during the acute phase of infection. The lowest  concentration of SARS-CoV-2 viral copies this assay can detect is 250  copies / mL. A negative result does not preclude SARS-CoV-2 infection  and should not be used as the sole basis for treatment or other  patient management decisions.  A negative result may occur with  improper specimen collection / handling, submission of specimen other  than nasopharyngeal swab, presence of viral mutation(s) within the  areas targeted by this assay, and inadequate number of viral copies  (<250 copies / mL). A negative result must be combined with clinical  observations, patient history, and epidemiological information. If result is POSITIVE SARS-CoV-2 target nucleic acids are DETECTED. The SARS-CoV-2 RNA is generally detectable in upper and lower  respiratory specimens dur ing  the acute phase of infection.  Positive  results are indicative of active infection with SARS-CoV-2.  Clinical  correlation with patient history and other diagnostic information is  necessary to determine patient infection status.  Positive results do  not rule out bacterial infection or co-infection with other viruses. If result is PRESUMPTIVE POSTIVE SARS-CoV-2 nucleic acids MAY BE PRESENT.   A presumptive positive result was obtained on the submitted specimen  and confirmed on  repeat testing.  While 2019 novel coronavirus  (SARS-CoV-2) nucleic acids may be present in the submitted sample  additional confirmatory testing may be necessary for epidemiological  and / or clinical management purposes  to differentiate between  SARS-CoV-2 and other Sarbecovirus currently known to infect humans.  If clinically indicated additional testing with an alternate test  methodology [id number]) is advised. The SARS-CoV-2 RNA is generally  detectable in upper and lower respiratory sp ecimens during the acute  phase of infection. The expected result is Negative. Fact Sheet for Patients:  StrictlyIdeas.no Fact Sheet for Healthcare Providers: BankingDealers.co.za This test is not yet approved or cleared by the [place] FDA and has been authorized for detection and/or diagnosis of SARS-CoV-2 by FDA under an Emergency Use Authorization (EUA).  This EUA will remain in effect (meaning this test can be used) for the duration of the COVID-19 declaration under Section 564(b)(1) of the Act, 21 U.S.C. section 360bbb-3(b)(1), unless the authorization is terminated or revoked sooner. Performed at [facility], [address] [place], [place] [address]   Urinalysis, Complete w Microscopic     Status: Abnormal   Collection Time: 01/19/19  5:53 PM  Result Value Ref Range   Color, Urine STRAW (A) YELLOW   APPearance CLEAR (A) CLEAR   Specific Gravity,  Urine 1.008 1.005 - 1.030   pH 6.0 5.0 - 8.0   Glucose, UA 50 (A) NEGATIVE mg/dL   Hgb urine dipstick NEGATIVE NEGATIVE   Bilirubin Urine NEGATIVE NEGATIVE   Ketones, ur NEGATIVE NEGATIVE mg/dL   Protein, ur 30 (A) NEGATIVE mg/dL   Nitrite NEGATIVE NEGATIVE   Leukocytes,Ua NEGATIVE NEGATIVE   RBC / HPF 0-5 0 - 5 RBC/hpf   WBC, UA NONE SEEN 0 - 5 WBC/hpf   Bacteria, UA NONE SEEN NONE SEEN   Squamous Epithelial / LPF NONE SEEN 0 - 5   Mucus PRESENT     Comment: Performed at [facility], [address], [place], [place] [address]   Dg Chest 1 View  Result Date: 01/19/2019 CLINICAL DATA:  Hip fracture. EXAM: CHEST  1 VIEW COMPARISON:  None. FINDINGS: The heart size and mediastinal contours are within normal limits. Both lungs are clear. The visualized skeletal structures are unremarkable. IMPRESSION: No active disease. Electronically Signed   By: Dorise Bullion III M.D   On: 01/19/2019 16:05   Ct Head Wo Contrast  Result Date: 01/19/2019 CLINICAL DATA:  Head trauma, fall in the driveway EXAM: CT HEAD WITHOUT CONTRAST; CT CERVICAL SPINE WITHOUT CONTRAST TECHNIQUE: Contiguous axial images were obtained from the base of the skull through the vertex without intravenous contrast. COMPARISON:  None. FINDINGS: Brain: No evidence of acute territorial infarction, hemorrhage, hydrocephalus,extra-axial collection or mass lesion/mass effect. There is dilatation the ventricles and sulci consistent with age-related atrophy. Low-attenuation changes in the deep white matter consistent with small vessel ischemia. Vascular: No hyperdense vessel. Calcifications are seen within the internal carotid, vertebral artery, and basilar artery. Skull: The skull is intact. No fracture or focal lesion identified. Sinuses/Orbits: The visualized paranasal sinuses and mastoid air cells are clear. The globes appear intact, however there is coarse calcification seen along the left globe. Other: None Cervical spine:  Alignment: There is slight reversal of the normal cervical lordosis. A grade 1 anterolisthesis of C3 on C4 is seen measuring 3 mm. A minimal retrolisthesis of C4 on C5 and C5 on C6 is seen. Skull base and vertebrae: Visualized skull base is intact. No atlanto-occipital dissociation. The  vertebral body heights are well maintained. No fracture or pathologic osseous lesion seen. Soft tissues and spinal canal: The visualized paraspinal soft tissues are unremarkable. No prevertebral soft tissue swelling is seen. The spinal canal is grossly unremarkable, no large epidural collection or significant canal narrowing. Disc levels: Multilevel degenerative changes are seen with disc osteophyte complex and uncovertebral osteophytosis is most notable at C5-C6 and C6-C7. Upper chest: The lung apices are clear. Densely calcified carotid arteries are seen. Other: None IMPRESSION: 1. No acute intracranial abnormality. 2. Findings consistent with age related atrophy and chronic small vessel ischemia 3. Coarse calcifications along the posterior left globe. 4. No acute fracture or malalignment of the cervical spine. 5. Grade 1 anterolisthesis of C3 on C4 with multilevel degenerative changes. Electronically Signed   By: Prudencio Pair M.D.   On: 01/19/2019 16:02   Ct Cervical Spine Wo Contrast  Result Date: 01/19/2019 CLINICAL DATA:  Head trauma, fall in the driveway EXAM: CT HEAD WITHOUT CONTRAST; CT CERVICAL SPINE WITHOUT CONTRAST TECHNIQUE: Contiguous axial images were obtained from the base of the skull through the vertex without intravenous contrast. COMPARISON:  None. FINDINGS: Brain: No evidence of acute territorial infarction, hemorrhage, hydrocephalus,extra-axial collection or mass lesion/mass effect. There is dilatation the ventricles and sulci consistent with age-related atrophy. Low-attenuation changes in the deep white matter consistent with small vessel ischemia. Vascular: No hyperdense vessel. Calcifications are seen  within the internal carotid, vertebral artery, and basilar artery. Skull: The skull is intact. No fracture or focal lesion identified. Sinuses/Orbits: The visualized paranasal sinuses and mastoid air cells are clear. The globes appear intact, however there is coarse calcification seen along the left globe. Other: None Cervical spine: Alignment: There is slight reversal of the normal cervical lordosis. A grade 1 anterolisthesis of C3 on C4 is seen measuring 3 mm. A minimal retrolisthesis of C4 on C5 and C5 on C6 is seen. Skull base and vertebrae: Visualized skull base is intact. No atlanto-occipital dissociation. The vertebral body heights are well maintained. No fracture or pathologic osseous lesion seen. Soft tissues and spinal canal: The visualized paraspinal soft tissues are unremarkable. No prevertebral soft tissue swelling is seen. The spinal canal is grossly unremarkable, no large epidural collection or significant canal narrowing. Disc levels: Multilevel degenerative changes are seen with disc osteophyte complex and uncovertebral osteophytosis is most notable at C5-C6 and C6-C7. Upper chest: The lung apices are clear. Densely calcified carotid arteries are seen. Other: None IMPRESSION: 1. No acute intracranial abnormality. 2. Findings consistent with age related atrophy and chronic small vessel ischemia 3. Coarse calcifications along the posterior left globe. 4. No acute fracture or malalignment of the cervical spine. 5. Grade 1 anterolisthesis of C3 on C4 with multilevel degenerative changes. Electronically Signed   By: Prudencio Pair M.D.   On: 01/19/2019 16:02   Dg Hip Unilat W Or Wo Pelvis 2-3 Views Right  Result Date: 01/19/2019 CLINICAL DATA:  Pain after fall. EXAM: DG HIP (WITH OR WITHOUT PELVIS) 2-3V RIGHT COMPARISON:  None. FINDINGS: There is a displaced fracture through the right femoral neck. Mild degenerative changes seen in the right hip. No dislocation. IMPRESSION: Displaced fracture through  the right femoral neck resulting in foreshortening of the right lower extremity. Electronically Signed   By: Dorise Bullion III M.D   On: 01/19/2019 16:05    Pending Labs Unresulted Labs (From admission, onward)    Start     Ordered   01/19/19 1754  Hemoglobin A1c  Once,  STAT    Comments: To assess prior glycemic control    01/19/19 1753   Signed and Held  Basic metabolic panel  Tomorrow morning,   R     Signed and Held   Signed and Held  CBC  Tomorrow morning,   R     Signed and Held          Vitals/Pain Today's Vitals   01/19/19 1630 01/19/19 1700 01/19/19 1730 01/19/19 1803  BP: (!) 139/58 129/62 137/63   Pulse: (!) 58 60 62   Resp: 16 (!) 21 16   Temp:      TempSrc:      SpO2: 98% 97% 97%   Weight:      Height:      PainSc:    5     Isolation Precautions No active isolations  Medications Medications  oxyCODONE-acetaminophen (PERCOCET/ROXICET) 5-325 MG per tablet 1 tablet (1 tablet Oral Given 01/19/19 1817)  pantoprazole (PROTONIX) EC tablet 40 mg (has no administration in time range)  0.9 %  sodium chloride infusion (has no administration in time range)  insulin aspart (novoLOG) injection 0-9 Units (has no administration in time range)  insulin aspart (novoLOG) injection 0-5 Units (has no administration in time range)  morphine 4 MG/ML injection 4 mg (4 mg Intravenous Given 01/19/19 1550)    Mobility walks with device Low fall risk     R Recommendations: See Admitting Provider Note  Report given to:   Additional Notes:

## 2019-01-19 NOTE — ED Triage Notes (Signed)
Pt fell at home in driveway was on the ground for 15-20 minutes before neighbors found him. Pt c/o right hip pain. External rotation and shortening of the right leg noted on arrival. Presents AxO x4, denies hitting head or loss of conciousness.

## 2019-01-20 ENCOUNTER — Inpatient Hospital Stay: Payer: Medicare Other

## 2019-01-20 ENCOUNTER — Inpatient Hospital Stay: Payer: Medicare Other | Admitting: Anesthesiology

## 2019-01-20 ENCOUNTER — Encounter
Admission: EM | Disposition: A | Discharge status: Skilled Nursing Facility | Payer: Self-pay | Source: Home / Self Care | Attending: Specialist

## 2019-01-20 HISTORY — PX: INTRAMEDULLARY (IM) NAIL INTERTROCHANTERIC: SHX5875

## 2019-01-20 LAB — BASIC METABOLIC PANEL
Anion gap: 13 (ref 5–15)
BUN: 73 mg/dL — ABNORMAL HIGH (ref 8–23)
CO2: 20 mmol/L — ABNORMAL LOW (ref 22–32)
Calcium: 8.7 mg/dL — ABNORMAL LOW (ref 8.9–10.3)
Chloride: 103 mmol/L (ref 98–111)
Creatinine, Ser: 3.82 mg/dL — ABNORMAL HIGH (ref 0.61–1.24)
GFR calc Af Amer: 16 mL/min — ABNORMAL LOW (ref >60)
GFR calc non Af Amer: 14 mL/min — ABNORMAL LOW (ref >60)
Glucose, Bld: 175 mg/dL — ABNORMAL HIGH (ref 70–99)
Potassium: 3.8 mmol/L (ref 3.5–5.1)
Sodium: 136 mmol/L (ref 135–145)

## 2019-01-20 LAB — CBC
HCT: 26.4 % — ABNORMAL LOW (ref 39.0–52.0)
Hemoglobin: 8.9 g/dL — ABNORMAL LOW (ref 13.0–17.0)
MCH: 31.1 pg (ref 26.0–34.0)
MCHC: 33.7 g/dL (ref 30.0–36.0)
MCV: 92.3 fL (ref 80.0–100.0)
Platelets: 127 10*3/uL — ABNORMAL LOW (ref 150–400)
RBC: 2.86 MIL/uL — ABNORMAL LOW (ref 4.22–5.81)
RDW: 12.5 % (ref 11.5–15.5)
WBC: 8.2 10*3/uL (ref 4.0–10.5)
nRBC: 0 % (ref 0.0–0.2)

## 2019-01-20 LAB — GLUCOSE, CAPILLARY
Glucose-Capillary: 127 mg/dL — ABNORMAL HIGH (ref 70–99)
Glucose-Capillary: 137 mg/dL — ABNORMAL HIGH (ref 70–99)
Glucose-Capillary: 157 mg/dL — ABNORMAL HIGH (ref 70–99)
Glucose-Capillary: 166 mg/dL — ABNORMAL HIGH (ref 70–99)
Glucose-Capillary: 187 mg/dL — ABNORMAL HIGH (ref 70–99)

## 2019-01-20 SURGERY — FIXATION, FRACTURE, INTERTROCHANTERIC, WITH INTRAMEDULLARY ROD
Anesthesia: Spinal | Site: Hip | Laterality: Right

## 2019-01-20 MED ORDER — HEPARIN SODIUM (PORCINE) 5000 UNIT/ML IJ SOLN
5000.0000 [IU] | Freq: Three times a day (TID) | INTRAMUSCULAR | Status: DC
  Administered 2019-01-21 – 2019-01-23 (×7): 5000 [IU] via SUBCUTANEOUS
  Filled 2019-01-20 (×7): qty 1

## 2019-01-20 MED ORDER — DIPHENHYDRAMINE HCL 12.5 MG/5ML PO ELIX: 12.5000 mg | ORAL_SOLUTION | ORAL | Status: DC | PRN

## 2019-01-20 MED ORDER — METOCLOPRAMIDE HCL 10 MG PO TABS: 5.0000 mg | ORAL_TABLET | Freq: Three times a day (TID) | ORAL | Status: DC | PRN

## 2019-01-20 MED ORDER — SENNOSIDES-DOCUSATE SODIUM 8.6-50 MG PO TABS: 1.0000 | ORAL_TABLET | Freq: Every evening | ORAL | Status: DC | PRN

## 2019-01-20 MED ORDER — FLEET ENEMA 7-19 GM/118ML RE ENEM: 1.0000 | ENEMA | Freq: Once | RECTAL | Status: DC | PRN

## 2019-01-20 MED ORDER — FENTANYL CITRATE (PF) 100 MCG/2ML IJ SOLN: 25.0000 ug | INTRAMUSCULAR | Status: DC | PRN

## 2019-01-20 MED ORDER — KETAMINE HCL 50 MG/ML IJ SOLN
INTRAMUSCULAR | Status: DC | PRN
  Administered 2019-01-20: 50 mg via INTRAMUSCULAR
  Administered 2019-01-20 (×2): 25 mg via INTRAMUSCULAR

## 2019-01-20 MED ORDER — ONDANSETRON HCL 4 MG/2ML IJ SOLN: 4.0000 mg | Freq: Once | INTRAMUSCULAR | Status: DC | PRN

## 2019-01-20 MED ORDER — TRAMADOL HCL 50 MG PO TABS
50.0000 mg | ORAL_TABLET | Freq: Four times a day (QID) | ORAL | Status: DC | PRN
  Administered 2019-01-22: 50 mg via ORAL
  Filled 2019-01-20: qty 1

## 2019-01-20 MED ORDER — METOCLOPRAMIDE HCL 5 MG/ML IJ SOLN: 5.0000 mg | Freq: Three times a day (TID) | INTRAMUSCULAR | Status: DC | PRN

## 2019-01-20 MED ORDER — ONDANSETRON HCL 4 MG/2ML IJ SOLN: 4.0000 mg | Freq: Four times a day (QID) | INTRAMUSCULAR | Status: DC | PRN

## 2019-01-20 MED ORDER — BUPIVACAINE HCL (PF) 0.5 % IJ SOLN
INTRAMUSCULAR | Status: AC
  Filled 2019-01-20: qty 30

## 2019-01-20 MED ORDER — PROPOFOL 500 MG/50ML IV EMUL
INTRAVENOUS | Status: DC | PRN
  Administered 2019-01-20: 50 ug/kg/min via INTRAVENOUS

## 2019-01-20 MED ORDER — ONDANSETRON HCL 4 MG PO TABS: 4.0000 mg | ORAL_TABLET | Freq: Four times a day (QID) | ORAL | Status: DC | PRN

## 2019-01-20 MED ORDER — CEFAZOLIN SODIUM-DEXTROSE 1-4 GM/50ML-% IV SOLN
1.0000 g | Freq: Once | INTRAVENOUS | Status: AC
  Administered 2019-01-20: 17:00:00 1 g via INTRAVENOUS
  Filled 2019-01-20: qty 50

## 2019-01-20 MED ORDER — DOCUSATE SODIUM 100 MG PO CAPS
100.0000 mg | ORAL_CAPSULE | Freq: Two times a day (BID) | ORAL | Status: DC
  Administered 2019-01-20 – 2019-01-23 (×6): 100 mg via ORAL
  Filled 2019-01-20 (×6): qty 1

## 2019-01-20 MED ORDER — NEOMYCIN-POLYMYXIN B GU 40-200000 IR SOLN
Status: AC
  Filled 2019-01-20: qty 1

## 2019-01-20 MED ORDER — ACETAMINOPHEN 500 MG PO TABS
1000.0000 mg | ORAL_TABLET | Freq: Three times a day (TID) | ORAL | Status: AC
  Administered 2019-01-20 – 2019-01-21 (×4): 1000 mg via ORAL
  Filled 2019-01-20 (×4): qty 2

## 2019-01-20 MED ORDER — HYDROMORPHONE HCL 1 MG/ML IJ SOLN: 0.2500 mg | INTRAMUSCULAR | Status: DC | PRN

## 2019-01-20 MED ORDER — EPHEDRINE SULFATE 50 MG/ML IJ SOLN
INTRAMUSCULAR | Status: DC | PRN
  Administered 2019-01-20: 10 mg via INTRAVENOUS

## 2019-01-20 MED ORDER — ENOXAPARIN SODIUM 30 MG/0.3ML ~~LOC~~ SOLN: 30.0000 mg | SUBCUTANEOUS | Status: DC

## 2019-01-20 MED ORDER — OXYCODONE HCL 5 MG PO TABS: 5.0000 mg | ORAL_TABLET | ORAL | Status: DC | PRN

## 2019-01-20 MED ORDER — CEFAZOLIN SODIUM-DEXTROSE 1-4 GM/50ML-% IV SOLN
1.0000 g | Freq: Four times a day (QID) | INTRAVENOUS | Status: AC
  Administered 2019-01-20 – 2019-01-21 (×3): 1 g via INTRAVENOUS
  Filled 2019-01-20 (×3): qty 50

## 2019-01-20 MED ORDER — BUPIVACAINE HCL (PF) 0.5 % IJ SOLN
INTRAMUSCULAR | Status: DC | PRN
  Administered 2019-01-20: 3 mL

## 2019-01-20 MED ORDER — BUPIVACAINE LIPOSOME 1.3 % IJ SUSP
INTRAMUSCULAR | Status: AC
  Filled 2019-01-20: qty 20

## 2019-01-20 MED ORDER — BUPIVACAINE LIPOSOME 1.3 % IJ SUSP
INTRAMUSCULAR | Status: DC | PRN
  Administered 2019-01-20: 18:00:00 50 mL

## 2019-01-20 MED ORDER — BISACODYL 10 MG RE SUPP
10.0000 mg | Freq: Every day | RECTAL | Status: DC | PRN
  Administered 2019-01-22: 10 mg via RECTAL
  Filled 2019-01-20: qty 1

## 2019-01-20 MED ORDER — SODIUM CHLORIDE 0.9 % IR SOLN
Status: DC | PRN
  Administered 2019-01-20: 500 mL

## 2019-01-20 MED ORDER — NEOMYCIN-POLYMYXIN B GU 40-200000 IR SOLN
Status: AC
  Filled 2019-01-20: qty 4

## 2019-01-20 MED ORDER — SODIUM CHLORIDE 0.9 % IV SOLN
INTRAVENOUS | Status: DC | PRN
  Administered 2019-01-20: 17:00:00 25 ug/min via INTRAVENOUS

## 2019-01-20 MED ORDER — SODIUM CHLORIDE 0.9 % IV SOLN
INTRAVENOUS | Status: DC
  Administered 2019-01-20 – 2019-01-21 (×2): via INTRAVENOUS

## 2019-01-20 MED ORDER — OXYCODONE HCL 5 MG PO TABS
2.5000 mg | ORAL_TABLET | ORAL | Status: DC | PRN
  Administered 2019-01-22: 5 mg via ORAL
  Filled 2019-01-20 (×2): qty 1

## 2019-01-20 MED ORDER — PHENYLEPHRINE HCL (PRESSORS) 10 MG/ML IV SOLN
INTRAVENOUS | Status: DC | PRN
  Administered 2019-01-20: 200 ug via INTRAVENOUS

## 2019-01-20 SURGICAL SUPPLY — 47 items
"PENCIL ELECTRO HAND CTR " (MISCELLANEOUS) ×1 IMPLANT
BIT DRILL 4.0X280 (BIT) ×2 IMPLANT
BIT DRILL 5.0 CALIBRATED STEP (BIT) ×2 IMPLANT
BLADE SURG 15 STRL LF DISP TIS (BLADE) ×1 IMPLANT
BLADE SURG 15 STRL SS (BLADE) ×2
CANISTER SUCT 1200ML W/VALVE (MISCELLANEOUS) ×3 IMPLANT
CHLORAPREP W/TINT 26 (MISCELLANEOUS) ×3 IMPLANT
COVER WAND RF STERILE (DRAPES) ×3 IMPLANT
DRAPE 3/4 80X56 (DRAPES) ×3 IMPLANT
DRAPE SURG 17X11 SM STRL (DRAPES) ×6 IMPLANT
DRAPE U-SHAPE 47X51 STRL (DRAPES) ×6 IMPLANT
DRSG OPSITE POSTOP 3X4 (GAUZE/BANDAGES/DRESSINGS) ×9 IMPLANT
ELECT REM PT RETURN 9FT ADLT (ELECTROSURGICAL) ×3
ELECTRODE REM PT RTRN 9FT ADLT (ELECTROSURGICAL) ×1 IMPLANT
GLOVE BIOGEL PI IND STRL 8 (GLOVE) ×1 IMPLANT
GLOVE BIOGEL PI INDICATOR 8 (GLOVE) ×2
GLOVE SURG SYN 7.5  E (GLOVE) ×2
GLOVE SURG SYN 7.5 E (GLOVE) ×1 IMPLANT
GLOVE SURG SYN 7.5 PF PI (GLOVE) ×1 IMPLANT
GOWN STRL REUS W/ TWL LRG LVL3 (GOWN DISPOSABLE) ×1 IMPLANT
GOWN STRL REUS W/ TWL XL LVL3 (GOWN DISPOSABLE) ×1 IMPLANT
GOWN STRL REUS W/TWL LRG LVL3 (GOWN DISPOSABLE) ×2
GOWN STRL REUS W/TWL XL LVL3 (GOWN DISPOSABLE) ×2
GUIDE PIN 3.2X330 (PIN) ×6
GUIDEWIRE BALL NOSE 3.0X900 (WIRE) ×2 IMPLANT
KIT PATIENT CARE HANA TABLE (KITS) ×3 IMPLANT
KIT TURNOVER KIT A (KITS) ×3 IMPLANT
MAT ABSORB  FLUID 56X50 GRAY (MISCELLANEOUS) ×4
MAT ABSORB FLUID 56X50 GRAY (MISCELLANEOUS) ×2 IMPLANT
NAIL 9X130X20 SHORT (Nail) ×2 IMPLANT
NDL FILTER BLUNT 18X1 1/2 (NEEDLE) ×1 IMPLANT
NEEDLE FILTER BLUNT 18X 1/2SAF (NEEDLE) ×2
NEEDLE FILTER BLUNT 18X1 1/2 (NEEDLE) ×1 IMPLANT
NEEDLE HYPO 22GX1.5 SAFETY (NEEDLE) ×3 IMPLANT
NS IRRIG 1000ML POUR BTL (IV SOLUTION) ×3 IMPLANT
PACK HIP COMPR (MISCELLANEOUS) ×3 IMPLANT
PENCIL ELECTRO HAND CTR (MISCELLANEOUS) ×3 IMPLANT
PIN GUIDE 3.2X330 (PIN) IMPLANT
SCREW AR 5X75 (Screw) ×2 IMPLANT
SCREW LAG GALILEO 10.5X105 (Screw) ×2 IMPLANT
SCREW LOCK CORT 5X36 (Screw) ×2 IMPLANT
STAPLER SKIN PROX 35W (STAPLE) ×3 IMPLANT
SUT VIC AB 2-0 CT2 27 (SUTURE) ×3 IMPLANT
SYR 10ML LL (SYRINGE) ×3 IMPLANT
SYR 30ML LL (SYRINGE) ×3 IMPLANT
TAPE CLOTH 3X10 WHT NS LF (GAUZE/BANDAGES/DRESSINGS) ×6 IMPLANT
TOOL ACTIVATION (INSTRUMENTS) ×2 IMPLANT

## 2019-01-20 NOTE — Anesthesia Post-op Follow-up Note (Signed)
Anesthesia QCDR form completed.        

## 2019-01-20 NOTE — Anesthesia Preprocedure Evaluation (Addendum)
Anesthesia Evaluation  Patient identified by MRN, date of birth, ID band Patient awake    Reviewed: Allergy & Precautions, H&P , NPO status , Patient's Chart, lab work & pertinent test results  Airway Mallampati: III  TM Distance: >3 FB Neck ROM: limited    Dental  (+) Teeth Intact   Pulmonary neg pulmonary ROS, neg shortness of breath, neg COPD, neg recent URI,           Cardiovascular hypertension, (-) angina+ Peripheral Vascular Disease  (-) Past MI, (-) Cardiac Stents, (-) CABG and (-) CHF + Valvular Problems/Murmurs      Neuro/Psych negative neurological ROS  negative psych ROS   GI/Hepatic negative GI ROS, Neg liver ROS,   Endo/Other  diabetes, Type 2, Oral Hypoglycemic Agents  Renal/GU CRFRenal disease     Musculoskeletal   Abdominal   Peds  Hematology negative hematology ROS (+)   Anesthesia Other Findings Past Medical History: No date: Cancer ([place]) No date: Chronic kidney disease No date: Colon polyp No date: Diabetes mellitus without complication ([place]) 8638: Heart murmur No date: Hyperlipidemia 1980: Hypertension No date: Retinal detachment  Past Surgical History: 2011,06/23/13: COLONOSCOPY     Comment:  Dr Bary Castilla 06-28-2011: COLONOSCOPY 08/30/2015: COLONOSCOPY WITH PROPOFOL; N/A     Comment:  Procedure: COLONOSCOPY WITH PROPOFOL;  Surgeon: [name], MD;  Location: [facility] ENDOSCOPY;  Service:               Endoscopy;  Laterality: N/A; No date: EYE SURGERY No date: HERNIA REPAIR 04/20/2018: LOWER EXTREMITY ANGIOGRAPHY; Right     Comment:  Procedure: Lower Extremity Angiography;  Surgeon: Algernon Huxley, MD;  Location: [place] CV LAB;  Service:               Cardiovascular;  Laterality: Right; 06-23-13: POLYPECTOMY No date: ]  BMI    Body Mass Index: 20.97 kg/m      Reproductive/Obstetrics negative OB ROS                           Anesthesia Physical Anesthesia Plan  ASA: III  Anesthesia Plan: Spinal   Post-op Pain Management:    Induction:   PONV Risk Score and Plan:   Airway Management Planned: Natural Airway and Simple Face Mask  Additional Equipment:   Intra-op Plan:   Post-operative Plan:   Informed Consent: I have reviewed the patients History and Physical, chart, labs and discussed the procedure including the risks, benefits and alternatives for the proposed anesthesia with the patient or authorized representative who has indicated his/her understanding and acceptance.     Dental Advisory Given  Plan Discussed with: Anesthesiologist and CRNA  Anesthesia Plan Comments:        Anesthesia Quick Evaluation

## 2019-01-20 NOTE — TOC Progression Note (Signed)
Transition of Care [facility]) - Progression Note    Patient Details  Name: [name] MRN: [id number] Date of Birth: 05-06-1932  Transition of Care [facility]) CM/SW [place], RN Phone Number: 01/20/2019, 10:02 AM  Clinical Narrative:     Requested the price of lovenox will notify the patient once obtained       Expected Discharge Plan and Services                                                 Social Determinants of Health (SDOH) Interventions    Readmission Risk Interventions No flowsheet data found.

## 2019-01-20 NOTE — H&P (Signed)
H&P reviewed. No significant changes noted.  

## 2019-01-20 NOTE — Consult Note (Signed)
ORTHOPAEDIC CONSULTATION  REQUESTING PHYSICIAN: Henreitta Leber, MD  Chief Complaint:   R hip pain  History of Present Illness: History obtained from the patient and in discussion with the patient's son/daughter-in-law who is the medical power of attorney.  [name] is a 85 y.o. malewho had a fall at home yesterday.  The patient noted immediate hip pain and inability to ambulate.  The patient ambulates unassisted at baseline.  Pain is described as sharp at its worst and a dull ache at its best.  Pain is rated a 10 out of 10 in severity.  Pain is improved with rest.  Pain is worse with any sort of movement.  X-rays in the emergency department show a right basicervical femoral neck fracture.  He has a history of stage IV chronic kidney disease that is being managed at [facility] by Dr. Radene Knee.  He may have an element of Parkinson's.  Per the family, he also may have some slight dementia but is capable of caring for himself and making medical decisions, although he frequently defers to his daughter-in-law.  Past Medical History:  Diagnosis Date  . Cancer ([place])   . Chronic kidney disease   . Colon polyp   . Diabetes mellitus without complication ([place])   . Heart murmur 2008  . Hyperlipidemia   . Hypertension 1980  . Retinal detachment    Past Surgical History:  Procedure Laterality Date  . COLONOSCOPY  2011,06/23/13   Dr Bary Castilla  . COLONOSCOPY  06-28-2011  . COLONOSCOPY WITH PROPOFOL N/A 08/30/2015   Procedure: COLONOSCOPY WITH PROPOFOL;  Surgeon: [name], MD;  Location: [facility] ENDOSCOPY;  Service: Endoscopy;  Laterality: N/A;  . EYE SURGERY    . HERNIA REPAIR    . LOWER EXTREMITY ANGIOGRAPHY Right 04/20/2018   Procedure: Lower Extremity Angiography;  Surgeon: Algernon Huxley, MD;  Location: [facility];  Service: Cardiovascular;  Laterality: Right;  . POLYPECTOMY  06-23-13  . ]     Social History    Socioeconomic History  . Marital status: Widowed    Spouse name: Not on file  . Number of children: Not on file  . Years of education: Not on file  . Highest education level: Not on file  Occupational History  . Not on file  Social Needs  . Financial resource strain: Not on file  . Food insecurity    Worry: Not on file    Inability: Not on file  . Transportation needs    Medical: Not on file    Non-medical: Not on file  Tobacco Use  . Smoking status: Never Smoker  . Smokeless tobacco: Never Used  Substance and Sexual Activity  . Alcohol use: No  . Drug use: No  . Sexual activity: Not on file  Lifestyle  . Physical activity    Days per week: Not on file    Minutes per session: Not on file  . Stress: Not on file  Relationships  . Social Herbalist on phone: Not on file    Gets together: Not on file    Attends religious service: Not on file    Active member of club or organization: Not on file    Attends meetings of clubs or organizations: Not on file    Relationship status: Not on file  Other Topics Concern  . Not on file  Social History Narrative  . Not on file   Family History  Problem Relation Age of Onset  .  Colon cancer Mother   . Diabetes Father   . Diabetes Brother   . Stroke Brother   . Diabetes Son    Allergies  Allergen Reactions  . Meperidine Other (See Comments)    Severe bradycardia Severe bradycardia   . Penicillins Rash   Prior to Admission medications   Medication Sig Start Date End Date Taking? Authorizing Provider  amLODipine (NORVASC) 10 MG tablet Take 10 mg by mouth [name].  05/28/13  Yes [provider]  aspirin EC 81 MG EC tablet Take 1 tablet (81 mg total) by mouth [name]. 04/22/18  Yes Max Sane, MD  B-Complex TABS Take 1 tablet by mouth [name].   Yes [provider]  calcitRIOL (ROCALTROL) 0.25 MCG capsule Take 0.25 mcg by mouth [name].  07/13/18  Yes [provider]  carvedilol (COREG) 12.5 MG  tablet Take 12.5 mg by mouth 2 (two) times [name] with a meal.   Yes [provider]  Ferrous Sulfate (IRON) 28 MG TABS Take 1 tablet by mouth.   Yes [provider]  furosemide (LASIX) 20 MG tablet Take 10-40 mg by mouth 2 (two) times [name]. Take 40 mg by mouth in the morning, then 10 mg by mouth at lunch   Yes [provider]  JALYN 0.5-0.4 MG CAPS Take 1 capsule by mouth [name].    Yes [provider]  linagliptin (TRADJENTA) 5 MG TABS tablet Take 5 mg by mouth [name].   Yes [provider]  losartan (COZAAR) 25 MG tablet Take 15 mg by mouth 2 (two) times [name].   Yes [provider]  Multiple Vitamin (MULTIVITAMIN) capsule Take 1 capsule by mouth [name].   Yes [provider]  sodium bicarbonate 650 MG tablet Take 650 mg by mouth 2 (two) times [name].   Yes [provider]   Recent Labs    01/19/19 1516 01/19/19 1645 01/20/19 0539  WBC 13.1*  --  8.2  HGB 10.4*  --  8.9*  HCT 31.3*  --  26.4*  PLT 131*  --  127*  K 4.2  --  3.8  CL 98  --  103  CO2 21*  --  20*  BUN 79*  --  73*  CREATININE 3.90*  --  3.82*  GLUCOSE 168*  --  175*  CALCIUM 9.3  --  8.7*  INR  --  1.0  --    Dg Chest 1 View  Result Date: 01/19/2019 CLINICAL DATA:  Hip fracture. EXAM: CHEST  1 VIEW COMPARISON:  None. FINDINGS: The heart size and mediastinal contours are within normal limits. Both lungs are clear. The visualized skeletal structures are unremarkable. IMPRESSION: No active disease. Electronically Signed   By: Dorise Bullion III M.D   On: 01/19/2019 16:05   Ct Head Wo Contrast  Result Date: 01/19/2019 CLINICAL DATA:  Head trauma, fall in the driveway EXAM: CT HEAD WITHOUT CONTRAST; CT CERVICAL SPINE WITHOUT CONTRAST TECHNIQUE: Contiguous axial images were obtained from the base of the skull through the vertex without intravenous contrast. COMPARISON:  None. FINDINGS: Brain: No evidence of acute territorial infarction, hemorrhage,  hydrocephalus,extra-axial collection or mass lesion/mass effect. There is dilatation the ventricles and sulci consistent with age-related atrophy. Low-attenuation changes in the deep white matter consistent with small vessel ischemia. Vascular: No hyperdense vessel. Calcifications are seen within the internal carotid, vertebral artery, and basilar artery. Skull: The skull is intact. No fracture or focal lesion identified. Sinuses/Orbits: The visualized paranasal sinuses and mastoid  air cells are clear. The globes appear intact, however there is coarse calcification seen along the left globe. Other: None Cervical spine: Alignment: There is slight reversal of the normal cervical lordosis. A grade 1 anterolisthesis of C3 on C4 is seen measuring 3 mm. A minimal retrolisthesis of C4 on C5 and C5 on C6 is seen. Skull base and vertebrae: Visualized skull base is intact. No atlanto-occipital dissociation. The vertebral body heights are well maintained. No fracture or pathologic osseous lesion seen. Soft tissues and spinal canal: The visualized paraspinal soft tissues are unremarkable. No prevertebral soft tissue swelling is seen. The spinal canal is grossly unremarkable, no large epidural collection or significant canal narrowing. Disc levels: Multilevel degenerative changes are seen with disc osteophyte complex and uncovertebral osteophytosis is most notable at C5-C6 and C6-C7. Upper chest: The lung apices are clear. Densely calcified carotid arteries are seen. Other: None IMPRESSION: 1. No acute intracranial abnormality. 2. Findings consistent with age related atrophy and chronic small vessel ischemia 3. Coarse calcifications along the posterior left globe. 4. No acute fracture or malalignment of the cervical spine. 5. Grade 1 anterolisthesis of C3 on C4 with multilevel degenerative changes. Electronically Signed   By: Prudencio Pair M.D.   On: 01/19/2019 16:02   Ct Cervical Spine Wo Contrast  Result Date:  01/19/2019 CLINICAL DATA:  Head trauma, fall in the driveway EXAM: CT HEAD WITHOUT CONTRAST; CT CERVICAL SPINE WITHOUT CONTRAST TECHNIQUE: Contiguous axial images were obtained from the base of the skull through the vertex without intravenous contrast. COMPARISON:  None. FINDINGS: Brain: No evidence of acute territorial infarction, hemorrhage, hydrocephalus,extra-axial collection or mass lesion/mass effect. There is dilatation the ventricles and sulci consistent with age-related atrophy. Low-attenuation changes in the deep white matter consistent with small vessel ischemia. Vascular: No hyperdense vessel. Calcifications are seen within the internal carotid, vertebral artery, and basilar artery. Skull: The skull is intact. No fracture or focal lesion identified. Sinuses/Orbits: The visualized paranasal sinuses and mastoid air cells are clear. The globes appear intact, however there is coarse calcification seen along the left globe. Other: None Cervical spine: Alignment: There is slight reversal of the normal cervical lordosis. A grade 1 anterolisthesis of C3 on C4 is seen measuring 3 mm. A minimal retrolisthesis of C4 on C5 and C5 on C6 is seen. Skull base and vertebrae: Visualized skull base is intact. No atlanto-occipital dissociation. The vertebral body heights are well maintained. No fracture or pathologic osseous lesion seen. Soft tissues and spinal canal: The visualized paraspinal soft tissues are unremarkable. No prevertebral soft tissue swelling is seen. The spinal canal is grossly unremarkable, no large epidural collection or significant canal narrowing. Disc levels: Multilevel degenerative changes are seen with disc osteophyte complex and uncovertebral osteophytosis is most notable at C5-C6 and C6-C7. Upper chest: The lung apices are clear. Densely calcified carotid arteries are seen. Other: None IMPRESSION: 1. No acute intracranial abnormality. 2. Findings consistent with age related atrophy and chronic  small vessel ischemia 3. Coarse calcifications along the posterior left globe. 4. No acute fracture or malalignment of the cervical spine. 5. Grade 1 anterolisthesis of C3 on C4 with multilevel degenerative changes. Electronically Signed   By: Prudencio Pair M.D.   On: 01/19/2019 16:02   Dg Hip Unilat W Or Wo Pelvis 2-3 Views Right  Result Date: 01/19/2019 CLINICAL DATA:  Pain after fall. EXAM: DG HIP (WITH OR WITHOUT PELVIS) 2-3V RIGHT COMPARISON:  None. FINDINGS: There is a displaced fracture through the right femoral neck.  Mild degenerative changes seen in the right hip. No dislocation. IMPRESSION: Displaced fracture through the right femoral neck resulting in foreshortening of the right lower extremity. Electronically Signed   By: Dorise Bullion III M.D   On: 01/19/2019 16:05     Positive ROS: All other systems have been reviewed and were otherwise negative with the exception of those mentioned in the HPI and as above.  Physical Exam: BP 128/61 (BP Location: Right Arm)   Pulse 65   Temp 100 F (37.8 C) (Oral)   Resp 18   Ht 5\' 9"  (1.753 m)   Wt 64.4 kg   SpO2 97%   BMI 20.97 kg/m  General:  Alert, no acute distress Psychiatric:  Patient is competent for consent with normal mood and affect   Cardiovascular:  No pedal edema, regular rate and rhythm Respiratory:  No wheezing, non-labored breathing GI:  Abdomen is soft and non-tender Skin:  No lesions in the area of chief complaint, no erythema Neurologic:  Sensation intact distally, CN grossly intact Lymphatic:  No axillary or cervical lymphadenopathy  Orthopedic Exam:  RLE: 5/5 DF/PF/EHL SILT s/s/t/sp/dp distr Foot wwp +Log roll/axial load   X-rays:  As above: R basicervical femoral neck fracture  Assessment/Plan: [name] is a 85 y.o. male with R basicervical femoral neck fracture   1. I discussed the various treatment options including both surgical and non-surgical management of the fracture with the And his  son/daughter-in-law (medical PoA). We discussed the high risk of perioperative complications due to patient's age, and other comorbidities, especially including his chronic kidney disease.  After discussion of risks, benefits, and alternatives to surgery, the family and patient were in agreement to proceed with surgery. The goals of surgery would be to provide adequate pain relief and allow for early mobilization. Plan for surgery is R hip cephalomedullary nailing today, 01/20/2019. 2. NPO until OR 3. Hold anticoagulation in advance of OR 4. Patient is medically optimized at this time per prior hospitalist notes.          Leim Fabry   01/20/2019 8:59 AM

## 2019-01-20 NOTE — Anesthesia Procedure Notes (Signed)
Spinal  Patient location during procedure: OR Start time: 01/20/2019 4:50 PM End time: 01/20/2019 4:54 PM Staffing Resident/CRNA: Nelda Marseille, CRNA Performed: resident/CRNA  Preanesthetic Checklist Completed: patient identified, site marked, surgical consent, pre-op evaluation, timeout performed, IV checked, risks and benefits discussed and monitors and equipment checked Spinal Block Patient position: sitting Prep: Betadine Patient monitoring: heart rate, continuous pulse ox, blood pressure and cardiac monitor Approach: midline Location: L4-5 Injection technique: single-shot Needle Needle type: Whitacre and Introducer  Needle gauge: 25 G Needle length: 9 cm Assessment Sensory level: T10 Additional Notes Negative paresthesia. Negative blood return. Positive free-flowing CSF. Expiration date of kit checked and confirmed. Patient tolerated procedure well, without complications.

## 2019-01-20 NOTE — Progress Notes (Signed)
[facility] [place] NAME: [name]    MR#:  [id number]  DATE OF BIRTH:  17-Apr-1932  SUBJECTIVE:   Patient presented to the hospital after a fall and noted to have a right hip fracture.  Plan for surgery later this afternoon.  Pt's daughter in law at bedside.  Pt has no complaints presently other than some right hip pain.   REVIEW OF SYSTEMS:    Review of Systems  Constitutional: Negative for chills and fever.  HENT: Negative for congestion and tinnitus.   Eyes: Negative for blurred vision and double vision.  Respiratory: Negative for cough, shortness of breath and wheezing.   Cardiovascular: Negative for chest pain, orthopnea and PND.  Gastrointestinal: Negative for abdominal pain, diarrhea, nausea and vomiting.  Genitourinary: Negative for dysuria and hematuria.  Musculoskeletal: Positive for falls and joint pain (Right HIp).  Neurological: Negative for dizziness, sensory change and focal weakness.  All other systems reviewed and are negative.   Nutrition: Will start after surgery today Tolerating Diet: after surgery today Tolerating PT: Await Eval.   DRUG ALLERGIES:   Allergies  Allergen Reactions   Meperidine Other (See Comments)    Severe bradycardia Severe bradycardia    Penicillins Rash    VITALS:  Blood pressure 135/66, pulse 70, temperature 99.5 F (37.5 C), temperature source Oral, resp. rate 18, height 5\' 9"  (1.753 m), weight 64.4 kg, SpO2 97 %.  PHYSICAL EXAMINATION:   Physical Exam  GENERAL:  85 y.o.-year-old patient lying in bed in no acute distress.  EYES: Pupils equal, round, reactive to light and accommodation. No scleral icterus. Extraocular muscles intact.  HEENT: Head atraumatic, normocephalic. Oropharynx and nasopharynx clear.  NECK:  Supple, no jugular venous distention. No thyroid enlargement, no tenderness.  LUNGS: Normal breath sounds bilaterally, no wheezing, rales, rhonchi. No use of  accessory muscles of respiration.  CARDIOVASCULAR: S1, S2 normal. No murmurs, rubs, or gallops.  ABDOMEN: Soft, nontender, nondistended. Bowel sounds present. No organomegaly or mass.  EXTREMITIES: No cyanosis, clubbing or edema b/l.   Right Lower extremity shortened and externally rotated due to the fracture. NEUROLOGIC: Cranial nerves II through XII are intact. No focal Motor or sensory deficits b/l.    PSYCHIATRIC: The patient is alert and oriented x 3.  SKIN: No obvious rash, lesion, or ulcer.    LABORATORY PANEL:   CBC Recent Labs  Lab 01/20/19 0539  WBC 8.2  HGB 8.9*  HCT 26.4*  PLT 127*   ------------------------------------------------------------------------------------------------------------------  Chemistries  Recent Labs  Lab 01/20/19 0539  NA 136  K 3.8  CL 103  CO2 20*  GLUCOSE 175*  BUN 73*  CREATININE 3.82*  CALCIUM 8.7*   ------------------------------------------------------------------------------------------------------------------  Cardiac Enzymes No results for input(s): TROPONINI in the last 168 hours. ------------------------------------------------------------------------------------------------------------------  RADIOLOGY:  Dg Chest 1 View  Result Date: 01/19/2019 CLINICAL DATA:  Hip fracture. EXAM: CHEST  1 VIEW COMPARISON:  None. FINDINGS: The heart size and mediastinal contours are within normal limits. Both lungs are clear. The visualized skeletal structures are unremarkable. IMPRESSION: No active disease. Electronically Signed   By: Dorise Bullion III M.D   On: 01/19/2019 16:05   Ct Head Wo Contrast  Result Date: 01/19/2019 CLINICAL DATA:  Head trauma, fall in the driveway EXAM: CT HEAD WITHOUT CONTRAST; CT CERVICAL SPINE WITHOUT CONTRAST TECHNIQUE: Contiguous axial images were obtained from the base of the skull through the vertex without intravenous contrast. COMPARISON:  None. FINDINGS: Brain:  No evidence of acute territorial  infarction, hemorrhage, hydrocephalus,extra-axial collection or mass lesion/mass effect. There is dilatation the ventricles and sulci consistent with age-related atrophy. Low-attenuation changes in the deep white matter consistent with small vessel ischemia. Vascular: No hyperdense vessel. Calcifications are seen within the internal carotid, vertebral artery, and basilar artery. Skull: The skull is intact. No fracture or focal lesion identified. Sinuses/Orbits: The visualized paranasal sinuses and mastoid air cells are clear. The globes appear intact, however there is coarse calcification seen along the left globe. Other: None Cervical spine: Alignment: There is slight reversal of the normal cervical lordosis. A grade 1 anterolisthesis of C3 on C4 is seen measuring 3 mm. A minimal retrolisthesis of C4 on C5 and C5 on C6 is seen. Skull base and vertebrae: Visualized skull base is intact. No atlanto-occipital dissociation. The vertebral body heights are well maintained. No fracture or pathologic osseous lesion seen. Soft tissues and spinal canal: The visualized paraspinal soft tissues are unremarkable. No prevertebral soft tissue swelling is seen. The spinal canal is grossly unremarkable, no large epidural collection or significant canal narrowing. Disc levels: Multilevel degenerative changes are seen with disc osteophyte complex and uncovertebral osteophytosis is most notable at C5-C6 and C6-C7. Upper chest: The lung apices are clear. Densely calcified carotid arteries are seen. Other: None IMPRESSION: 1. No acute intracranial abnormality. 2. Findings consistent with age related atrophy and chronic small vessel ischemia 3. Coarse calcifications along the posterior left globe. 4. No acute fracture or malalignment of the cervical spine. 5. Grade 1 anterolisthesis of C3 on C4 with multilevel degenerative changes. Electronically Signed   By: Prudencio Pair M.D.   On: 01/19/2019 16:02   Ct Cervical Spine Wo  Contrast  Result Date: 01/19/2019 CLINICAL DATA:  Head trauma, fall in the driveway EXAM: CT HEAD WITHOUT CONTRAST; CT CERVICAL SPINE WITHOUT CONTRAST TECHNIQUE: Contiguous axial images were obtained from the base of the skull through the vertex without intravenous contrast. COMPARISON:  None. FINDINGS: Brain: No evidence of acute territorial infarction, hemorrhage, hydrocephalus,extra-axial collection or mass lesion/mass effect. There is dilatation the ventricles and sulci consistent with age-related atrophy. Low-attenuation changes in the deep white matter consistent with small vessel ischemia. Vascular: No hyperdense vessel. Calcifications are seen within the internal carotid, vertebral artery, and basilar artery. Skull: The skull is intact. No fracture or focal lesion identified. Sinuses/Orbits: The visualized paranasal sinuses and mastoid air cells are clear. The globes appear intact, however there is coarse calcification seen along the left globe. Other: None Cervical spine: Alignment: There is slight reversal of the normal cervical lordosis. A grade 1 anterolisthesis of C3 on C4 is seen measuring 3 mm. A minimal retrolisthesis of C4 on C5 and C5 on C6 is seen. Skull base and vertebrae: Visualized skull base is intact. No atlanto-occipital dissociation. The vertebral body heights are well maintained. No fracture or pathologic osseous lesion seen. Soft tissues and spinal canal: The visualized paraspinal soft tissues are unremarkable. No prevertebral soft tissue swelling is seen. The spinal canal is grossly unremarkable, no large epidural collection or significant canal narrowing. Disc levels: Multilevel degenerative changes are seen with disc osteophyte complex and uncovertebral osteophytosis is most notable at C5-C6 and C6-C7. Upper chest: The lung apices are clear. Densely calcified carotid arteries are seen. Other: None IMPRESSION: 1. No acute intracranial abnormality. 2. Findings consistent with age  related atrophy and chronic small vessel ischemia 3. Coarse calcifications along the posterior left globe. 4. No acute fracture or malalignment of the cervical spine.  5. Grade 1 anterolisthesis of C3 on C4 with multilevel degenerative changes. Electronically Signed   By: Prudencio Pair M.D.   On: 01/19/2019 16:02   Dg Hip Unilat W Or Wo Pelvis 2-3 Views Right  Result Date: 01/19/2019 CLINICAL DATA:  Pain after fall. EXAM: DG HIP (WITH OR WITHOUT PELVIS) 2-3V RIGHT COMPARISON:  None. FINDINGS: There is a displaced fracture through the right femoral neck. Mild degenerative changes seen in the right hip. No dislocation. IMPRESSION: Displaced fracture through the right femoral neck resulting in foreshortening of the right lower extremity. Electronically Signed   By: Dorise Bullion III M.D   On: 01/19/2019 16:05     ASSESSMENT AND PLAN:   85 year old male with past medical history of CKD stage IV, diabetes, hypertension, hyperlipidemia who presented to the hospital after a mechanical fall noted to have right hip fracture.  1.  Status post fall and right hip fracture- seen by orthopedics and plan for intramedullary nailing later today. -Patient has been cleared from a medical perspective for surgery. - Await PT eval postoperatively.  2.  CKD stage IV-patient's creatinine is close to baseline we will continue to monitor. - As per the patient and patient's daughter-in-law who is the POA patient does not want to be on dialysis but no acute indication presently. - cont. Sodium bicarb  3.  Diabetes type 2 without complication-continue sliding scale insulin, follow blood sugars.  4.  Essential hypertension-continue losartan, Norvasc  5.  BPH-continue Avodart, Flomax. - no urinary retention.   6. GERD - cont. Protonix.    All the records are reviewed and case discussed with Care Management/Social Worker. Management plans discussed with the patient, family and they are in agreement.  CODE STATUS:  DNR  DVT Prophylaxis: Will start Lovenox post-operatively   TOTAL TIME TAKING CARE OF THIS PATIENT: 30 minutes.   POSSIBLE D/C IN 2-3 DAYS, DEPENDING ON CLINICAL CONDITION.   Henreitta Leber M.D on 01/20/2019 at 3:02 PM  Between 7am to 6pm - Pager - [phone]  After 6pm go to [email or web address] - Proofreader  [facility]  Office  [phone]  CC: Primary care physician; [name], MD

## 2019-01-20 NOTE — TOC Initial Note (Signed)
Transition of Care [facility]) - Initial/Assessment Note    Patient Details  Name: [name] MRN: [id number] Date of Birth: [birth year]  Transition of Care [facility]) CM/SW Contact:    Su Hilt, RN Phone Number: 01/20/2019, 10:15 AM  Clinical Narrative:                 Met with the patient to discuss DC plan and needs He lives at home alone but states he has children that help He has a RW and a BSC at home He is not sure if he would be willing to go to rehab if it is needed or not and will decide at the time if needed. He prefers to go home with [facility] and family support. Will continue to Monitor for needs  Expected Discharge Plan: [place] Barriers to Discharge: Continued Medical Work up   Patient Goals and CMS Choice Patient states their goals for this hospitalization and ongoing recovery are:: go home with [facility] and family support      Expected Discharge Plan and Services Expected Discharge Plan: [place]   Discharge Planning Services: CM Consult   Living arrangements for the past 2 months: Single Family Home                 DME Arranged: N/A                    Prior Living Arrangements/Services Living arrangements for the past 2 months: Single Family Home Lives with:: Self Patient language and need for interpreter reviewed:: No Do you feel safe going back to the place where you live?: Yes      Need for Family Participation in Patient Care: No (Comment) Care giver support system in place?: Yes (comment) Current home services: DME(RW, BSC) Criminal Activity/Legal Involvement Pertinent to Current Situation/Hospitalization: No - Comment as needed  Activities of Daily Living Home Assistive Devices/Equipment: None ADL Screening (condition at time of admission) Patient's cognitive ability adequate to safely complete daily activities?: Yes Is the patient deaf or have difficulty hearing?: Yes Does the patient have difficulty  seeing, even when wearing glasses/contacts?: No Does the patient have difficulty concentrating, remembering, or making decisions?: No Patient able to express need for assistance with ADLs?: Yes Does the patient have difficulty dressing or bathing?: No Independently performs ADLs?: Yes (appropriate for developmental age) Does the patient have difficulty walking or climbing stairs?: No Weakness of Legs: Both Weakness of Arms/Hands: None  Permission Sought/Granted   Permission granted to share information with : Yes, Verbal Permission Granted              Emotional Assessment Appearance:: Appears stated age Attitude/Demeanor/Rapport: Engaged Affect (typically observed): Appropriate, Calm Orientation: : Oriented to Self, Oriented to Place, Oriented to  Time, Oriented to Situation Alcohol / Substance Use: Not Applicable Psych Involvement: No (comment)  Admission diagnosis:  AKI (acute kidney injury) ([place]) [N17.9] Pre-op exam [Z01.818] Closed fracture of neck of right femur, initial encounter ([place]) [S72.001A] Fall, initial encounter [W19.XXXA] Patient Active Problem List   Diagnosis Date Noted  . Closed right hip fracture ([place]) 01/19/2019  . Hip fracture ([place]) 01/19/2019  . Left arm cellulitis 09/05/2018  . Lymphedema 07/27/2018  . Chronic venous insufficiency 07/27/2018  . Ulcers of both lower legs, limited to breakdown of skin ([place]) 06/29/2018  . Peripheral arterial disease ([place]) 05/29/2018  . DM2 (diabetes mellitus, type 2) ([place]) 04/04/2018  . HTN (hypertension) 04/04/2018  .  History of colonic polyps 07/14/2015   PCP:  [name], MD Pharmacy:   [facility], [address] [place] [address] Phone: [phone] Fax: [phone]  [place], [place] [address] [place] [address] Phone: [phone] Fax: [phone]     Social Determinants of Health (SDOH)  Interventions    Readmission Risk Interventions No flowsheet data found.

## 2019-01-20 NOTE — Transfer of Care (Signed)
Immediate Anesthesia Transfer of Care Note  Patient: [name]  Procedure(s) Performed: INTRAMEDULLARY (IM) NAIL INTERTROCHANTRIC (Right Hip)  Patient Location: PACU  Anesthesia Type:Spinal  Level of Consciousness: awake, alert  and oriented  Airway & Oxygen Therapy: Patient Spontanous Breathing and Patient connected to face mask oxygen  Post-op Assessment: Report given to RN and Post -op Vital signs reviewed and stable  Post vital signs: Reviewed and stable  Last Vitals:  Vitals Value Taken Time  BP    Temp    Pulse 55 01/20/19 1836  Resp 11 01/20/19 1836  SpO2 100 % 01/20/19 1836  Vitals shown include unvalidated device data.  Last Pain:  Vitals:   01/20/19 1501  TempSrc: Oral  PainSc: 0-No pain         Complications: No apparent anesthesia complications

## 2019-01-20 NOTE — TOC Benefit Eligibility Note (Signed)
Transition of Care [facility]) Benefit Eligibility Note    Patient Details  Name: KRAMER HANRAHAN MRN: [id number] Date of Birth: Sep 23, 1931   Medication/Dose: ENOXAPARIN  40 MG  DAILY X 14DAYS SYRINGES  Covered?: Yes  Tier: Other(4 DRUG)  Prescription Coverage Preferred Pharmacy: [facility]  Spoke with Person/Company/Phone Number:: IQNV[employer]  @   [employer] # [phone]  Co-Pay: 45 %  OF TOTAL COST  Prior Approval: No  Deductible: Met  Additional Notes: LOVENOX  40 MG DAILY  , NOT COVER, PRIOR APPROVAL- YES # [phone]    Memory Argue Phone Number: 01/20/2019, 11:47 AM

## 2019-01-20 NOTE — Progress Notes (Addendum)
Insulin will be on hold per MD Sainani's order.

## 2019-01-20 NOTE — Op Note (Signed)
DATE OF SURGERY: 01/20/2019  PREOPERATIVE DIAGNOSIS: Right basicervical femoral neck fracture  POSTOPERATIVE DIAGNOSIS: Right basicervical femoral neck fracture  PROCEDURE: Intramedullary nailing of R hip with cephalomedullary device  SURGEON: Cato Mulligan, MD  ANESTHESIA: spinal  EBL: 100 cc  IVF: per anesthesia record  COMPONENTS:  AOS Galileo Short Nail: 9x240mm; 137mm lag screw; 5x34mm derotational screw 38mm distal cortical interlocking screw  INDICATIONS: [name] is a 85 y.o. male who sustained an right basicervical femoral neck fracture after a fall. Risks and benefits of intramedullary nailing were explained to the patient and family (who were PoA). Risks include but are not limited to bleeding, infection, injury to tissues, nerves, vessels, nonunion/malunion, hardware failure, limb length discrepancy/hip rotation mismatch and risks of anesthesia and worsening kidney failure. The patient and family understand these risks, have completed an informed consent, and wish to proceed.   PROCEDURE:  The patient was brought into the operating room. After administering anesthesia, the patient was placed in the supine position on the [place] table. The uninjured leg was placed in an extended position while the injured lower extremity was placed in longitudinal traction. The fracture was reduced using longitudinal traction and slight external rotation. The adequacy of reduction was verified fluoroscopically in AP and lateral projections and found to be acceptable. The lateral aspect of the right hip and thigh were prepped with ChloraPrep solution before being draped sterilely. Preoperative IV antibiotics were administered. A timeout was performed to verify the appropriate surgical site, patient, and procedure.    The greater trochanter was identified and an approximately 6 cm incision was made about 3 fingerbreadths above the tip of the greater trochanter. The incision was carried down  through the subcutaneous tissues to expose the gluteal fascia. This was split the length of the incision, providing access to the tip of the trochanter. Under fluoroscopic guidance, a guidewire was drilled through the tip of the trochanter into the proximal metaphysis to the level of the lesser trochanter. After verifying its position fluoroscopically in AP and lateral projections, it was overreamed with the opening reamer to the level of the lesser trochanter. A guidewire was passed down through the femoral canal. The guidewire was overreamed sequentially using the flexible reamers. The appropriate sized nail was selected and advanced to the appropriate depth as verified fluoroscopically.    The guide system for the lag screw was positioned and advanced through an approximately 3cm stab incision over the lateral aspect of the proximal femur. The guidewire was drilled up through the femoral nail and into the femoral neck to rest within 5 mm of subchondral bone. After verifying its position in the femoral neck and head in both AP and lateral projections, the guidewire was measured and appropriate sized lag screw was selected. The guidewire was overreamed to the appropriate depth before the lag screw was inserted. However, near final seating, it was noted that the fracture had become somewhat malreduced. Therefore, screw was removed. The guide system for a derotational screw just superior and parallel to the lag screw was utilized. After appropriate drilling, a derotational screw was placed. The lag screw was then again advanced to the appropriate depth as verified fluoroscopically in AP and lateral projections. This time, the adequacy of reduction was maintained. The locking mechanism was deployed. Again, the adequacy of hardware position and fracture reduction was verified fluoroscopically in AP and lateral projections.   Attention was then turned to the distal interlocking screw. Using a targeted assembly, a  stab incision  was made and hole was drilled through the nail. An interlocking screw was placed with excellent purchase. Again the adequacy of screw position was verified fluoroscopically in AP and lateral projections.   The wounds were irrigated thoroughly with sterile saline solution. Local anesthetic was injected into the wounds. Deep fascia was closed with 0-Vicryl. The subcutaneous tissues were closed using 2-0 Vicryl interrupted sutures. The skin was closed using staples. Sterile occlusive dressings were applied to all wounds. The patient was then transferred to the recovery room in satisfactory condition.   POSTOPERATIVE PLAN: The patient will be WBAT on the operative extremity. Heparin SQ tid x 4 weeks to start on POD#1 due to decreased renal function. Perioperative IV antibiotics x 24 hours. PT/OT on POD#1.

## 2019-01-21 ENCOUNTER — Encounter: Payer: Self-pay | Admitting: Orthopedic Surgery

## 2019-01-21 LAB — BASIC METABOLIC PANEL
Anion gap: 7 (ref 5–15)
BUN: 58 mg/dL — ABNORMAL HIGH (ref 8–23)
CO2: 21 mmol/L — ABNORMAL LOW (ref 22–32)
Calcium: 7.9 mg/dL — ABNORMAL LOW (ref 8.9–10.3)
Chloride: 111 mmol/L (ref 98–111)
Creatinine, Ser: 3.3 mg/dL — ABNORMAL HIGH (ref 0.61–1.24)
GFR calc Af Amer: 19 mL/min — ABNORMAL LOW (ref >60)
GFR calc non Af Amer: 16 mL/min — ABNORMAL LOW (ref >60)
Glucose, Bld: 210 mg/dL — ABNORMAL HIGH (ref 70–99)
Potassium: 3.7 mmol/L (ref 3.5–5.1)
Sodium: 139 mmol/L (ref 135–145)

## 2019-01-21 LAB — CBC
HCT: 23.7 % — ABNORMAL LOW (ref 39.0–52.0)
Hemoglobin: 7.9 g/dL — ABNORMAL LOW (ref 13.0–17.0)
MCH: 31.3 pg (ref 26.0–34.0)
MCHC: 33.3 g/dL (ref 30.0–36.0)
MCV: 94 fL (ref 80.0–100.0)
Platelets: 110 10*3/uL — ABNORMAL LOW (ref 150–400)
RBC: 2.52 MIL/uL — ABNORMAL LOW (ref 4.22–5.81)
RDW: 12.6 % (ref 11.5–15.5)
WBC: 13.3 10*3/uL — ABNORMAL HIGH (ref 4.0–10.5)
nRBC: 0 % (ref 0.0–0.2)

## 2019-01-21 LAB — GLUCOSE, CAPILLARY
Glucose-Capillary: 174 mg/dL — ABNORMAL HIGH (ref 70–99)
Glucose-Capillary: 296 mg/dL — ABNORMAL HIGH (ref 70–99)
Glucose-Capillary: 198 mg/dL — ABNORMAL HIGH (ref 70–99)
Glucose-Capillary: 159 mg/dL — ABNORMAL HIGH (ref 70–99)

## 2019-01-21 NOTE — Evaluation (Addendum)
Occupational Therapy Evaluation Patient Details Name: [name] MRN: [id number] DOB: [birth year] Today's Date: 01/21/2019    History of Present Illness Pt. is an 85 y.o. male who was admitted to [facility] with a Basicervical Femoral Neck Fracture, Pt. underwent an IM Nailing of the Right Hip Fx with Cephalomedullary device. Pt. PMHx includes: CA, CKD, Colon Polyp, DM, Heart Murmur, Hyperlipidemia, HTN, and Retinal Detachment.   Clinical Impression   Pt. presents with weakness, limited activity tolerance, and limited functional mobility which hinders his ability to complete basic ADL and IADL functioning. Pt. resides at home alone. Pt. was independent with basic ADLs, and light IADL tasks. Pt. received meal on wheels. His son assists with medication management using a pillbox. Pt. drove minimally, and his family assists with grocery shopping. Pt. education was provided verbally, and through visual demonstration about A/E use for LE ADLs. Pt. could benefit from OT services for ADL training, continued A/E training, and pt. education about home modification, and DME. Pt. would benefit from SNF level of care upon discharge with follow-up OT services.    Follow Up Recommendations  SNF    Equipment Recommendations       Recommendations for Other Services       Precautions / Restrictions Precautions Precautions: Fall Restrictions Weight Bearing Restrictions: Yes RLE Weight Bearing: Weight bearing as tolerated      Mobility Bed Mobility Pt. Up in recliner chair upon arrival           Transfers Overall transfer level: Needs assistance Equipment used: Rolling walker (2 wheeled) Transfers: Sit to/from Stand Sit to Stand: Mod assist         General transfer comment: Mobility per PT report    Balance                           ADL either performed or assessed with clinical judgement   ADL Overall ADL's : Needs assistance/impaired Eating/Feeding: Set  up;Independent;Sitting   Grooming: Set up;Independent;Sitting   Upper Body Bathing: Set up;Minimal assistance;Sitting   Lower Body Bathing: Set up;Maximal assistance   Upper Body Dressing : Set up;Minimal assistance;Sitting   Lower Body Dressing: Set up;Maximal assistance                 General ADL Comments: Pt. education was provided about A/E use for LE ADLs.     Vision Baseline Vision/History: Wears glasses Wears Glasses: At all times Patient Visual Report: No change from baseline       Perception     Praxis      Pertinent Vitals/Pain Pain Assessment: No/denies pain Pain Score: 4  Pain Location: R hip Pain Descriptors / Indicators: Aching;Guarding;Grimacing Pain Intervention(s): Limited activity within patient's tolerance;Monitored during session;Repositioned;Premedicated before session     Hand Dominance Right   Extremity/Trunk Assessment Upper Extremity Assessment Upper Extremity Assessment: Overall WFL for tasks assessed         Communication Communication Communication: No difficulties   Cognition Arousal/Alertness: Awake/alert Behavior During Therapy: WFL for tasks assessed/performed Overall Cognitive Status: Within Functional Limits for tasks assessed                                     General Comments       Exercises   Shoulder Instructions     Home Living Family/patient expects to be discharged to:: Private residence Living Arrangements: Alone Available  Help at Discharge: Family;Available PRN/intermittently Type of Home: House Home Access: Stairs to enter [employer] of Steps: 2 Entrance Stairs-Rails: None Home Layout: One level     Bathroom Shower/Tub: Door;Walk-in shower         Home Equipment: Hand held shower head;None          Prior Functioning/Environment Level of Independence: Needs assistance    ADL's / Homemaking Assistance Needed: Independent with ADLs, light household IADLs. Pt.  received meals on wheels, son assists with medication management using a pillbox. Pt.drove minimally. Familiy assists with groceries.   Comments: Indep with ADLs, household and community mobilization without assist device; can drive, but family typically assists with groceries, community errands/needs        OT Problem List: Decreased strength;Decreased knowledge of use of DME or AE;Pain      OT Treatment/Interventions: Self-care/ADL training;Therapeutic exercise;Patient/family education;DME and/or AE instruction;Neuromuscular education    OT Goals(Current goals can be found in the care plan section) Acute Rehab OT Goals Patient Stated Goal: To regain independence OT Goal Formulation: With patient Potential to Achieve Goals: Good  OT Frequency: Min 2X/week   Barriers to D/C:            Co-evaluation              AM-PAC OT "6 Clicks" Daily Activity     Outcome Measure Help from another person eating meals?: None Help from another person taking care of personal grooming?: A Little Help from another person toileting, which includes using toliet, bedpan, or urinal?: A Little Help from another person bathing (including washing, rinsing, drying)?: A Lot Help from another person to put on and taking off regular upper body clothing?: A Little Help from another person to put on and taking off regular lower body clothing?: A Lot 6 Click Score: 17   End of Session Equipment Utilized During Treatment: Gait belt  Activity Tolerance: Patient tolerated treatment well Patient left: in chair;with call bell/phone within reach;with chair alarm set  OT Visit Diagnosis: Unsteadiness on feet (R26.81);Muscle weakness (generalized) (M62.81)                Time: 0086-7619 OT Time Calculation (min): 22 min Charges:  OT General Charges $OT Visit: 1 Visit OT Evaluation $OT Eval Low Complexity: 1 Low Harrel Carina, MS, OTR/L Harrel Carina 01/21/2019, 2:15 PM

## 2019-01-21 NOTE — Progress Notes (Signed)
[facility] [place] NAME: [name]    MR#:  [id number]  DATE OF BIRTH:  04-20-1932  SUBJECTIVE:   Status post intramedullary nailing of the right hip.  Postop day #1.  Sitting in chair with daughter at bedside. Has some Hip pain but no other associated symptoms.   REVIEW OF SYSTEMS:    Review of Systems  Constitutional: Negative for chills and fever.  HENT: Negative for congestion and tinnitus.   Eyes: Negative for blurred vision and double vision.  Respiratory: Negative for cough, shortness of breath and wheezing.   Cardiovascular: Negative for chest pain, orthopnea and PND.  Gastrointestinal: Negative for abdominal pain, diarrhea, nausea and vomiting.  Genitourinary: Negative for dysuria and hematuria.  Musculoskeletal: Positive for falls and joint pain (Right HIp).  Neurological: Negative for dizziness, sensory change and focal weakness.  All other systems reviewed and are negative.   Nutrition: Renal diet with fluid restrictions.  Tolerating Diet: Yes Tolerating PT: Await Eval.   DRUG ALLERGIES:   Allergies  Allergen Reactions   Meperidine Other (See Comments)    Severe bradycardia Severe bradycardia    Penicillins Rash    VITALS:  Blood pressure (!) 117/48, pulse 71, temperature 98.4 F (36.9 C), temperature source Oral, resp. rate 18, height 5\' 9"  (1.753 m), weight 64.4 kg, SpO2 96 %.  PHYSICAL EXAMINATION:   Physical Exam  GENERAL:  85 y.o.-year-old patient sitting up in chair in no acute distress.  EYES: Pupils equal, round, reactive to light and accommodation. No scleral icterus. Extraocular muscles intact.  HEENT: Head atraumatic, normocephalic. Oropharynx and nasopharynx clear.  NECK:  Supple, no jugular venous distention. No thyroid enlargement, no tenderness.  LUNGS: Normal breath sounds bilaterally, no wheezing, rales, rhonchi. No use of accessory muscles of respiration.  CARDIOVASCULAR: S1, S2  normal. No murmurs, rubs, or gallops.  ABDOMEN: Soft, nontender, nondistended. Bowel sounds present. No organomegaly or mass.  EXTREMITIES: No cyanosis, clubbing or edema b/l.   Right Hip dressing in place with no drainage or bleeding noted.  NEUROLOGIC: Cranial nerves II through XII are intact. No focal Motor or sensory deficits b/l.    PSYCHIATRIC: The patient is alert and oriented x 3.  SKIN: No obvious rash, lesion, or ulcer.    LABORATORY PANEL:   CBC Recent Labs  Lab 01/21/19 0336  WBC 13.3*  HGB 7.9*  HCT 23.7*  PLT 110*   ------------------------------------------------------------------------------------------------------------------  Chemistries  Recent Labs  Lab 01/21/19 0336  NA 139  K 3.7  CL 111  CO2 21*  GLUCOSE 210*  BUN 58*  CREATININE 3.30*  CALCIUM 7.9*   ------------------------------------------------------------------------------------------------------------------  Cardiac Enzymes No results for input(s): TROPONINI in the last 168 hours. ------------------------------------------------------------------------------------------------------------------  RADIOLOGY:  Dg Chest 1 View  Result Date: 01/19/2019 CLINICAL DATA:  Hip fracture. EXAM: CHEST  1 VIEW COMPARISON:  None. FINDINGS: The heart size and mediastinal contours are within normal limits. Both lungs are clear. The visualized skeletal structures are unremarkable. IMPRESSION: No active disease. Electronically Signed   By: Dorise Bullion III M.D   On: 01/19/2019 16:05   Ct Head Wo Contrast  Result Date: 01/19/2019 CLINICAL DATA:  Head trauma, fall in the driveway EXAM: CT HEAD WITHOUT CONTRAST; CT CERVICAL SPINE WITHOUT CONTRAST TECHNIQUE: Contiguous axial images were obtained from the base of the skull through the vertex without intravenous contrast. COMPARISON:  None. FINDINGS: Brain: No evidence of acute territorial infarction, hemorrhage, hydrocephalus,extra-axial collection or mass  lesion/mass effect. There is dilatation the ventricles and sulci consistent with age-related atrophy. Low-attenuation changes in the deep white matter consistent with small vessel ischemia. Vascular: No hyperdense vessel. Calcifications are seen within the internal carotid, vertebral artery, and basilar artery. Skull: The skull is intact. No fracture or focal lesion identified. Sinuses/Orbits: The visualized paranasal sinuses and mastoid air cells are clear. The globes appear intact, however there is coarse calcification seen along the left globe. Other: None Cervical spine: Alignment: There is slight reversal of the normal cervical lordosis. A grade 1 anterolisthesis of C3 on C4 is seen measuring 3 mm. A minimal retrolisthesis of C4 on C5 and C5 on C6 is seen. Skull base and vertebrae: Visualized skull base is intact. No atlanto-occipital dissociation. The vertebral body heights are well maintained. No fracture or pathologic osseous lesion seen. Soft tissues and spinal canal: The visualized paraspinal soft tissues are unremarkable. No prevertebral soft tissue swelling is seen. The spinal canal is grossly unremarkable, no large epidural collection or significant canal narrowing. Disc levels: Multilevel degenerative changes are seen with disc osteophyte complex and uncovertebral osteophytosis is most notable at C5-C6 and C6-C7. Upper chest: The lung apices are clear. Densely calcified carotid arteries are seen. Other: None IMPRESSION: 1. No acute intracranial abnormality. 2. Findings consistent with age related atrophy and chronic small vessel ischemia 3. Coarse calcifications along the posterior left globe. 4. No acute fracture or malalignment of the cervical spine. 5. Grade 1 anterolisthesis of C3 on C4 with multilevel degenerative changes. Electronically Signed   By: Prudencio Pair M.D.   On: 01/19/2019 16:02   Ct Cervical Spine Wo Contrast  Result Date: 01/19/2019 CLINICAL DATA:  Head trauma, fall in the  driveway EXAM: CT HEAD WITHOUT CONTRAST; CT CERVICAL SPINE WITHOUT CONTRAST TECHNIQUE: Contiguous axial images were obtained from the base of the skull through the vertex without intravenous contrast. COMPARISON:  None. FINDINGS: Brain: No evidence of acute territorial infarction, hemorrhage, hydrocephalus,extra-axial collection or mass lesion/mass effect. There is dilatation the ventricles and sulci consistent with age-related atrophy. Low-attenuation changes in the deep white matter consistent with small vessel ischemia. Vascular: No hyperdense vessel. Calcifications are seen within the internal carotid, vertebral artery, and basilar artery. Skull: The skull is intact. No fracture or focal lesion identified. Sinuses/Orbits: The visualized paranasal sinuses and mastoid air cells are clear. The globes appear intact, however there is coarse calcification seen along the left globe. Other: None Cervical spine: Alignment: There is slight reversal of the normal cervical lordosis. A grade 1 anterolisthesis of C3 on C4 is seen measuring 3 mm. A minimal retrolisthesis of C4 on C5 and C5 on C6 is seen. Skull base and vertebrae: Visualized skull base is intact. No atlanto-occipital dissociation. The vertebral body heights are well maintained. No fracture or pathologic osseous lesion seen. Soft tissues and spinal canal: The visualized paraspinal soft tissues are unremarkable. No prevertebral soft tissue swelling is seen. The spinal canal is grossly unremarkable, no large epidural collection or significant canal narrowing. Disc levels: Multilevel degenerative changes are seen with disc osteophyte complex and uncovertebral osteophytosis is most notable at C5-C6 and C6-C7. Upper chest: The lung apices are clear. Densely calcified carotid arteries are seen. Other: None IMPRESSION: 1. No acute intracranial abnormality. 2. Findings consistent with age related atrophy and chronic small vessel ischemia 3. Coarse calcifications along  the posterior left globe. 4. No acute fracture or malalignment of the cervical spine. 5. Grade 1 anterolisthesis of C3 on C4 with multilevel degenerative changes.  Electronically Signed   By: Prudencio Pair M.D.   On: 01/19/2019 16:02   Dg Hip Operative Unilat W Or W/o Pelvis Right  Result Date: 01/20/2019 CLINICAL DATA:  Hip fracture EXAM: OPERATIVE right HIP (WITH PELVIS IF PERFORMED)  VIEWS TECHNIQUE: Fluoroscopic spot image(s) were submitted for interpretation post-operatively. COMPARISON:  01/19/2019 FINDINGS: Ten low resolution intraoperative spot views of the right hip. Total fluoroscopy time was 2 minutes 2 seconds. Acute right fracture at the base of the femoral neck. Subsequent images demonstrate intramedullary rod and screw fixation of the right femur with anatomic alignment IMPRESSION: Intraoperative fluoroscopic assistance provided during surgical fixation of right hip fracture Electronically Signed   By: Donavan Foil M.D.   On: 01/20/2019 18:31   Dg Hip Unilat W Or Wo Pelvis 2-3 Views Right  Result Date: 01/19/2019 CLINICAL DATA:  Pain after fall. EXAM: DG HIP (WITH OR WITHOUT PELVIS) 2-3V RIGHT COMPARISON:  None. FINDINGS: There is a displaced fracture through the right femoral neck. Mild degenerative changes seen in the right hip. No dislocation. IMPRESSION: Displaced fracture through the right femoral neck resulting in foreshortening of the right lower extremity. Electronically Signed   By: Dorise Bullion III M.D   On: 01/19/2019 16:05     ASSESSMENT AND PLAN:   85 year old male with past medical history of CKD stage IV, diabetes, hypertension, hyperlipidemia who presented to the hospital after a mechanical fall noted to have right hip fracture.  1.  Status post fall and right hip fracture- seen by orthopedics status post right hip intra-medullary pinning postop day #1 today. - Continue pain control as needed oxycodone and tramadol. - Continue PT as tolerated.   2.  CKD stage  IV-patient's creatinine is close to baseline we will continue to monitor. - As per the patient and patient's daughter-in-law who is the POA patient does not want to be on dialysis and no acute indication presently. - cont. Sodium bicarb  3. Anemia  - likely Anemia of chronic Renal disease  - Hg. Stable post-operatively and will cont. To monitor. Transfuse if Hg < 7.   4.  Diabetes type 2 without complication-continue sliding scale insulin, follow blood sugars. - BS stable.   5.  Essential hypertension-continue losartan, Norvasc  6.  BPH-continue Avodart, Flomax. - no urinary retention.   7. GERD - cont. Protonix.    All the records are reviewed and case discussed with Care Management/Social Worker. Management plans discussed with the patient, family and they are in agreement.  CODE STATUS: DNR  DVT Prophylaxis: Will start Lovenox post-operatively   TOTAL TIME TAKING CARE OF THIS PATIENT: 30 minutes.   POSSIBLE D/C IN 2-3 DAYS, DEPENDING ON CLINICAL CONDITION.   Henreitta Leber M.D on 01/21/2019 at 2:01 PM  Between 7am to 6pm - Pager - [phone]  After 6pm go to [email or web address] - Proofreader  [facility]  Office  [phone]  CC: Primary care physician; [name], MD

## 2019-01-21 NOTE — Progress Notes (Signed)
Physical Therapy Treatment Patient Details Name: [name] MRN: [id number] DOB: 1931-10-03 Today's Date: 01/21/2019    History of Present Illness Pt. is an 85 y.o. male who was admitted to [facility] with a Basicervical Femoral Neck Fracture, Pt. underwent an IM Nailing of the Right Hip Fx with Cephalomedullary device. Pt. PMHx includes: CA, CKD, Colon Polyp, DM, Heart Murmur, Hyperlipidemia, HTN, and Retinal Detachment.    PT Comments    PT agreeable for PT for bed exercises at this time. Pt notes feeling quite stiff and sore after being up in the chair. Current pain 4/10. Educated pt/family on stiffness pain and use of exercise/gentle movement to decrease. Pt participates in supine bed exercises with assist as needed and pt/family educated; pt tolerated well without increase in pain during/after. Provided with written HEP. Continue PT to progress strength, range, endurance to improve all functional mobility.    Follow Up Recommendations  SNF     Equipment Recommendations  Rolling walker with 5" wheels;3in1 (PT)    Recommendations for Other Services       Precautions / Restrictions Precautions Precautions: Fall Restrictions Weight Bearing Restrictions: Yes RLE Weight Bearing: Weight bearing as tolerated    Mobility  Bed Mobility               General bed mobility comments: Not tested; pt states he was up in the chair too long and felt stiff and sore  Transfers                    Ambulation/Gait                 Stairs             Wheelchair Mobility    Modified Rankin (Stroke Patients Only)       Balance                                            Cognition Arousal/Alertness: Awake/alert Behavior During Therapy: WFL for tasks assessed/performed Overall Cognitive Status: Within Functional Limits for tasks assessed                                        Exercises General Exercises - Lower  Extremity Ankle Circles/Pumps: AROM;Both;20 reps Quad Sets: Strengthening;Both;10 reps Gluteal Sets: Strengthening;Both;10 reps Short Arc Quad: AROM;Strengthening;Both;10 reps Heel Slides: AAROM;Right;10 reps(AROM L) Hip ABduction/ADduction: AAROM;Right;10 reps(AROM L) Straight Leg Raises: AAROM;Strengthening;Both;10 reps Other Exercises Other Exercises: Pt given written HEP; education provided to both pt and daughter    General Comments        Pertinent Vitals/Pain Pain Assessment: 0-10 Pain Score: 4  Pain Location: R hip Pain Descriptors / Indicators: Constant;Sore Pain Intervention(s): Monitored during session;Limited activity within patient's tolerance    Home Living Family/patient expects to be discharged to:: Private residence Living Arrangements: Alone Available Help at Discharge: Family;Available PRN/intermittently Type of Home: House Home Access: Stairs to enter Entrance Stairs-Rails: None Home Layout: One level Home Equipment: Hand held shower head;None      Prior Function Level of Independence: Needs assistance    ADL's / Homemaking Assistance Needed: Independent with ADLs, light household IADLs. Pt. received meals on wheels, son assists with medication management using a pillbox. Pt.drove minimally. Familiy assists with groceries.  PT Goals (current goals can now be found in the care plan section) Acute Rehab PT Goals Patient Stated Goal: To regain independence Progress towards PT goals: Progressing toward goals    Frequency    BID      PT Plan Current plan remains appropriate    Co-evaluation              AM-PAC PT "6 Clicks" Mobility   Outcome Measure  Help needed turning from your back to your side while in a flat bed without using bedrails?: A Little Help needed moving from lying on your back to sitting on the side of a flat bed without using bedrails?: A Little Help needed moving to and from a bed to a chair (including a  wheelchair)?: A Little Help needed standing up from a chair using your arms (e.g., wheelchair or bedside chair)?: A Lot Help needed to walk in hospital room?: A Lot Help needed climbing 3-5 steps with a railing? : A Lot 6 Click Score: 15    End of Session   Activity Tolerance: Patient tolerated treatment well Patient left: in bed;with call bell/phone within reach;with bed alarm set;with family/visitor present;with SCD's reapplied   PT Visit Diagnosis: History of falling (Z91.81);Muscle weakness (generalized) (M62.81);Difficulty in walking, not elsewhere classified (R26.2);Pain Pain - Right/Left: Right Pain - part of body: Hip     Time: 9741-6384 PT Time Calculation (min) (ACUTE ONLY): 29 min  Charges:  $Therapeutic Exercise: 23-37 mins                      Larae Grooms, PTA 01/21/2019, 5:13 PM

## 2019-01-21 NOTE — Evaluation (Signed)
Physical Therapy Evaluation Patient Details Name: [name] MRN: [id number] DOB: January 29, 1932 Today's Date: 01/21/2019   History of Present Illness  presented to ER secondary to fall with acute onset of R hip pain; admitted for management of R femoral neck fracture, s/p IM nailing (01/20/19), WBAT.  Clinical Impression  Upon evaluation, patient alert and oriented; follows commands, eager for OOB attempts.  Reports pain well-controlled at 2-4/10, but quickly settles to 0-1/10 with rest and activity cessation.  R hip strength at least 3-/5, tolerating ROM adequate for basic transfers and mobility tasks.  Currently requiring min/mod assist for bed mobility; min assist for unsupported sitting balance (posterior lean), mod assist for sit/stand, basic transfers and short-distance gait with RW.  Generally unsteady with increased postural sway in all planes; constant cuing/assist for balance recovery and overall safety.  Recommend continued use of RW and +1 at all times.  Will continue to assess/progress gait efforts as appropriate. Would benefit from skilled PT to address above deficits and promote optimal return to PLOF; recommend transition to STR upon discharge from acute hospitalization.     Follow Up Recommendations SNF    Equipment Recommendations  Rolling walker with 5" wheels;3in1 (PT)    Recommendations for Other Services       Precautions / Restrictions Precautions Precautions: Fall Restrictions Weight Bearing Restrictions: Yes RLE Weight Bearing: Weight bearing as tolerated      Mobility  Bed Mobility Overal bed mobility: Needs Assistance Bed Mobility: Supine to Sit     Supine to sit: Min assist     General bed mobility comments: assist for truncal elevation, LE management  Transfers Overall transfer level: Needs assistance Equipment used: Rolling walker (2 wheeled) Transfers: Sit to/from Stand Sit to Stand: Mod assist         General transfer comment: assist for  weight shift, trunk lean and lift off  Ambulation/Gait Ambulation/Gait assistance: Mod assist Gait Distance (Feet): 5 Feet Assistive device: Rolling walker (2 wheeled)       General Gait Details: 3-point, step to gait pattern; very slow and unsteady (excessive sway in A/P plane with increased posterior LOB).  Decreased stance time/WBing R LE  Stairs            Wheelchair Mobility    Modified Rankin (Stroke Patients Only)       Balance Overall balance assessment: Needs assistance Sitting-balance support: No upper extremity supported;Feet supported Sitting balance-Leahy Scale: Fair Sitting balance - Comments: min assist for trunk control, weight shift to midline for unsupported sitting balance Postural control: Posterior lean   Standing balance-Leahy Scale: Poor Standing balance comment: bilat UE support, +1 at all times                             Pertinent Vitals/Pain Pain Assessment: 0-10 Pain Score: 4  Pain Location: R hip Pain Descriptors / Indicators: Aching;Guarding;Grimacing Pain Intervention(s): Limited activity within patient's tolerance;Monitored during session;Repositioned;Premedicated before session    Home Living Family/patient expects to be discharged to:: Private residence Living Arrangements: Alone Available Help at Discharge: Family;Available PRN/intermittently Type of Home: House Home Access: Stairs to enter Entrance Stairs-Rails: None Entrance Stairs-Number of Steps: 2 Home Layout: One level Home Equipment: None      Prior Function Level of Independence: Independent         Comments: Indep with ADLs, household and community mobilization without assist device; can drive, but family typically assists with groceries, community errands/needs  Hand Dominance   Dominant Hand: Right    Extremity/Trunk Assessment   Upper Extremity Assessment Upper Extremity Assessment: Overall WFL for tasks assessed    Lower Extremity  Assessment Lower Extremity Assessment: Generalized weakness(R hip grossly at least 3-/5, limited by pain; knee and ankle WFL.  Tends to prefer R LE ER at rest.  L LE grossly WFL)       Communication   Communication: No difficulties  Cognition Arousal/Alertness: Awake/alert Behavior During Therapy: WFL for tasks assessed/performed Overall Cognitive Status: Within Functional Limits for tasks assessed                                        General Comments      Exercises Other Exercises Other Exercises: Supine R LE therex, 1x10, act assist ROM: ankle pumps, quad sets, SAQs, heel slides, hip abduct/adduct.  Guarded due to pain, but functional for basic transfers and gait.   Assessment/Plan    PT Assessment Patient needs continued PT services  PT Problem List Decreased strength;Decreased range of motion;Decreased activity tolerance;Decreased balance;Decreased mobility;Decreased knowledge of use of DME;Decreased coordination;Decreased safety awareness;Decreased knowledge of precautions;Decreased skin integrity;Pain       PT Treatment Interventions DME instruction;Gait training;Stair training;Functional mobility training;Therapeutic activities;Therapeutic exercise;Balance training;Patient/family education    PT Goals (Current goals can be found in the Care Plan section)  Acute Rehab PT Goals Patient Stated Goal: to participate with session PT Goal Formulation: With patient Time For Goal Achievement: 02/04/19 Potential to Achieve Goals: Good    Frequency BID   Barriers to discharge Decreased caregiver support      Co-evaluation               AM-PAC PT "6 Clicks" Mobility  Outcome Measure Help needed turning from your back to your side while in a flat bed without using bedrails?: A Little Help needed moving from lying on your back to sitting on the side of a flat bed without using bedrails?: A Little Help needed moving to and from a bed to a chair  (including a wheelchair)?: A Little Help needed standing up from a chair using your arms (e.g., wheelchair or bedside chair)?: A Lot Help needed to walk in hospital room?: A Lot Help needed climbing 3-5 steps with a railing? : A Lot 6 Click Score: 15    End of Session Equipment Utilized During Treatment: Gait belt Activity Tolerance: Patient tolerated treatment well Patient left: in chair;with call bell/phone within reach;with chair alarm set Nurse Communication: Mobility status PT Visit Diagnosis: History of falling (Z91.81);Muscle weakness (generalized) (M62.81);Difficulty in walking, not elsewhere classified (R26.2);Pain Pain - Right/Left: Right Pain - part of body: Hip    Time: 8546-2703 PT Time Calculation (min) (ACUTE ONLY): 30 min   Charges:   PT Evaluation $PT Eval Moderate Complexity: 1 Mod PT Treatments $Therapeutic Exercise: 8-22 mins        Saphira Lahmann H. Owens Shark, PT, DPT, NCS 01/21/19, 10:55 AM [phone]

## 2019-01-21 NOTE — NC FL2 (Signed)
[place] LEVEL OF CARE SCREENING TOOL     IDENTIFICATION  Patient Name: [name] Birthdate: [birth year] Sex: male Admission Date (Current Location): 01/19/2019  [place] and [place] Number:  Engineering geologist and Address:  [facility], [address] [place],  [address]      Provider Number: [id number]  Attending Physician Name and Address:  Henreitta Leber, MD  Relative Name and Phone Number:       Current Level of Care: Hospital Recommended Level of Care: [place] Prior Approval Number:    Date Approved/Denied:   PASRR Number: [id number]  Discharge Plan: SNF    Current Diagnoses: Patient Active Problem List   Diagnosis Date Noted  . Closed right hip fracture ([place]) 01/19/2019  . Hip fracture ([place]) 01/19/2019  . Left arm cellulitis 09/05/2018  . Lymphedema 07/27/2018  . Chronic venous insufficiency 07/27/2018  . Ulcers of both lower legs, limited to breakdown of skin ([place]) 06/29/2018  . Peripheral arterial disease ([place]) 05/29/2018  . DM2 (diabetes mellitus, type 2) ([place]) 04/04/2018  . HTN (hypertension) 04/04/2018  . History of colonic polyps 07/14/2015    Orientation RESPIRATION BLADDER Height & Weight     Self, Time, Situation, Place  Normal Continent Weight: 142 lb (64.4 kg) Height:  5\' 9"  (175.3 cm)  BEHAVIORAL SYMPTOMS/MOOD NEUROLOGICAL BOWEL NUTRITION STATUS      Continent Diet(Diet: Renal)  AMBULATORY STATUS COMMUNICATION OF NEEDS Skin   Extensive Assist Verbally Surgical wounds(Incision: Right Hip.)                       Personal Care Assistance Level of Assistance  Bathing, Feeding, Dressing Bathing Assistance: Limited assistance Feeding assistance: Independent Dressing Assistance: Limited assistance     Functional Limitations Info  Sight, Hearing, Speech Sight Info: Impaired Hearing Info: Adequate Speech Info: Adequate    SPECIAL CARE FACTORS FREQUENCY  OT (By  licensed OT), PT (By licensed PT)     PT Frequency: 5 OT Frequency: 5            Contractures      Additional Factors Info  Code Status, Allergies Code Status Info: DNR Allergies Info: Meperidine, Penicillins           Current Medications (01/21/2019):  This is the current hospital active medication list Current Facility-Administered Medications  Medication Dose Route Frequency Provider Last Rate Last Dose  . 0.9 %  sodium chloride infusion   Intravenous Continuous Leim Fabry, MD 75 mL/hr at 01/21/19 1100    . acetaminophen (TYLENOL) tablet 1,000 mg  1,000 mg Oral Q8H Leim Fabry, MD   1,000 mg at 01/21/19 0509  . amLODipine (NORVASC) tablet 10 mg  10 mg Oral Daily Leim Fabry, MD   10 mg at 01/21/19 1025  . aspirin EC tablet 81 mg  81 mg Oral Daily Leim Fabry, MD   81 mg at 01/21/19 1025  . bisacodyl (DULCOLAX) suppository 10 mg  10 mg Rectal Daily PRN Leim Fabry, MD      . calcitRIOL (ROCALTROL) capsule 0.25 mcg  0.25 mcg Oral Daily Leim Fabry, MD   0.25 mcg at 01/21/19 1025  . diphenhydrAMINE (BENADRYL) 12.5 MG/5ML elixir 12.5-25 mg  12.5-25 mg Oral Q4H PRN Leim Fabry, MD      . docusate sodium (COLACE) capsule 100 mg  100 mg Oral BID Leim Fabry, MD   100 mg at 01/21/19 1025  . dutasteride (AVODART) capsule 0.5 mg  0.5 mg Oral Daily Leim Fabry, MD   0.5 mg at 01/21/19 1025   And  . tamsulosin (FLOMAX) capsule 0.4 mg  0.4 mg Oral Daily Leim Fabry, MD   0.4 mg at 01/21/19 1025  . ferrous sulfate tablet 325 mg  325 mg Oral q morning - 10a Leim Fabry, MD   325 mg at 01/21/19 1025  . heparin injection 5,000 Units  5,000 Units Subcutaneous Q8H Henreitta Leber, MD   5,000 Units at 01/21/19 0815  . HYDROmorphone (DILAUDID) injection 0.25-0.5 mg  0.25-0.5 mg Intravenous Q2H PRN Leim Fabry, MD      . insulin aspart (novoLOG) injection 0-5 Units  0-5 Units Subcutaneous QHS Leim Fabry, MD   2 Units at 01/19/19 2121  . insulin aspart (novoLOG) injection 0-9 Units   0-9 Units Subcutaneous TID WC Leim Fabry, MD   2 Units at 01/21/19 0815  . losartan (COZAAR) tablet 12.5 mg  12.5 mg Oral BID Leim Fabry, MD   12.5 mg at 01/21/19 1025  . metoCLOPramide (REGLAN) tablet 5-10 mg  5-10 mg Oral Q8H PRN Leim Fabry, MD       Or  . metoCLOPramide (REGLAN) injection 5-10 mg  5-10 mg Intravenous Q8H PRN Leim Fabry, MD      . multivitamin (RENA-VIT) tablet 1 tablet  1 tablet Oral Daily Leim Fabry, MD   1 tablet at 01/21/19 1025  . multivitamin with minerals tablet 1 tablet  1 tablet Oral Daily Leim Fabry, MD   1 tablet at 01/21/19 1025  . ondansetron (ZOFRAN) tablet 4 mg  4 mg Oral Q6H PRN Leim Fabry, MD       Or  . ondansetron [facility]) injection 4 mg  4 mg Intravenous Q6H PRN Leim Fabry, MD      . oxyCODONE (Oxy IR/ROXICODONE) immediate release tablet 2.5-5 mg  2.5-5 mg Oral Q3H PRN Leim Fabry, MD      . oxyCODONE (Oxy IR/ROXICODONE) immediate release tablet 5-10 mg  5-10 mg Oral Q4H PRN Leim Fabry, MD      . pantoprazole (PROTONIX) EC tablet 40 mg  40 mg Oral Daily Leim Fabry, MD   40 mg at 01/21/19 1025  . senna-docusate (Senokot-S) tablet 1 tablet  1 tablet Oral QHS PRN Leim Fabry, MD      . sodium bicarbonate tablet 650 mg  650 mg Oral BID Leim Fabry, MD   650 mg at 01/21/19 1025  . sodium phosphate (FLEET) 7-19 GM/118ML enema 1 enema  1 enema Rectal Once PRN Leim Fabry, MD      . traMADol Veatrice Bourbon) tablet 50 mg  50 mg Oral Q6H PRN Leim Fabry, MD         Discharge Medications: Please see discharge summary for a list of discharge medications.  Relevant Imaging Results:  Relevant Lab Results:   Additional Information SSN: [id number]  Kaylor Maiers, Veronia Beets, LCSW

## 2019-01-21 NOTE — Anesthesia Postprocedure Evaluation (Signed)
Anesthesia Post Note  Patient: [name]  Procedure(s) Performed: INTRAMEDULLARY (IM) NAIL INTERTROCHANTRIC (Right Hip)  Patient location during evaluation: Nursing Unit Anesthesia Type: Spinal Level of consciousness: patient cooperative Pain management: pain level controlled Vital Signs Assessment: post-procedure vital signs reviewed and stable Respiratory status: spontaneous breathing and respiratory function stable Cardiovascular status: blood pressure returned to baseline and stable Postop Assessment: no headache, no backache, no apparent nausea or vomiting, patient able to bend at knees and spinal receding Anesthetic complications: no     Last Vitals:  Vitals:   01/21/19 0347 01/21/19 0752  BP: 140/62 (!) 134/59  Pulse: 76 75  Resp: 16   Temp: 37.4 C 36.9 C  SpO2: 100% 96%    Last Pain:  Vitals:   01/21/19 0752  TempSrc: Oral  PainSc:                  Brantley Fling

## 2019-01-21 NOTE — TOC Progression Note (Signed)
Transition of Care [facility]) - Progression Note    Patient Details  Name: RAZA BAYLESS MRN: [id number] Date of Birth: 09/25/1931  [facility]) CM/SW Contact  Ilyssa Grennan, Lenice Llamas Phone Number: [phone]  01/21/2019, 3:09 PM  Clinical Narrative: Clinical Social Worker (Ruf[place]) received a call from [facility] stating that patient's daughter in law [name] has called her about a bed. FL2 complete and faxed out. Per Seth Bake she can accept patient at [facility]. Patient's daughter in law is in agreement with [facility] and reported that she could not find a private duty caregiver. Plan is for patient to D/C to [facility] when medically stable.     Expected Discharge Plan: [place] Barriers to Discharge: Continued Medical Work up  Expected Discharge Plan and Services Expected Discharge Plan: [place]   Discharge Planning Services: CM Consult   Living arrangements for the past 2 months: Single Family Home                 DME Arranged: N/A                     Social Determinants of Health (SDOH) Interventions    Readmission Risk Interventions Readmission Risk Prevention Plan 01/21/2019  Transportation Screening Complete  PCP or Specialist Appt within 3-5 Days Complete  HRI or Home Care Consult Complete  Palliative Care Screening Not Applicable  Medication Review (RN Care Manager) Complete  Some recent data might be hidden

## 2019-01-21 NOTE — Progress Notes (Signed)
PT Cancellation Note  Patient Details Name: GREGREY BLOYD MRN: [id number] DOB: 29-Apr-1932   Cancelled Treatment:    Reason Eval/Treat Not Completed: (Consult received and chart reviewed.  Patient currently eating breakfast.  Will re-attempt at later time/date as available.)   Demetress Tift H. Owens Shark, PT, DPT, NCS 01/21/19, 9:32 AM [phone]

## 2019-01-21 NOTE — TOC Progression Note (Signed)
Transition of Care [facility] - Progression Note    Patient Details  Name: [name] MRN: [id number] Date of Birth: 08-14-1931  Transition of Care [facility]) CM/SW Contact  Su Hilt, RN Phone Number: 01/21/2019, 11:51 AM  Clinical Narrative:    Spoke to the patient and his daughter, they want a Hospital bed and a RW, I notified Brad with [facility] and notified the physician of the need for the order He has used [facility] previously and would like to use them again, notified Corene Cornea with [facility], They are open to rehab as a last resort but are open to do a bed search just in case, They anticipate DC on Sat   Expected Discharge Plan: [place] Barriers to Discharge: Continued Medical Work up  Expected Discharge Plan and Services Expected Discharge Plan: [place]   Discharge Planning Services: CM Consult   Living arrangements for the past 2 months: Single Family Home                 DME Arranged: N/A                     Social Determinants of Health (SDOH) Interventions    Readmission Risk Interventions Readmission Risk Prevention Plan 01/21/2019  Transportation Screening Complete  PCP or Specialist Appt within 3-5 Days Complete  HRI or [place] Complete  Palliative Care Screening Not Applicable  Medication Review (RN Care Manager) Complete  Some recent data might be hidden

## 2019-01-21 NOTE — Progress Notes (Signed)
Subjective: 1 Day Post-Op Procedure(s) (LRB): INTRAMEDULLARY (IM) NAIL INTERTROCHANTRIC (Right) Patient reports pain as mild.   Patient is well, and has had no acute complaints or problems Plan is to go home on Saturday with HHPT. Negative for chest pain and shortness of breath Fever: no Gastrointestinal:Negative for nausea and vomiting  Objective: Vital signs in last 24 hours: Temp:  [97 F (36.1 C)-99.5 F (37.5 C)] 98.4 F (36.9 C) (08/13 0752) Pulse Rate:  [57-76] 75 (08/13 0752) Resp:  [13-19] 16 (08/13 0347) BP: (99-141)/(53-66) 134/59 (08/13 0752) SpO2:  [94 %-100 %] 96 % (08/13 0752)  Intake/Output from previous day:  Intake/Output Summary (Last 24 hours) at 01/21/2019 1129 Last data filed at 01/21/2019 0600 Gross per 24 hour  Intake 2560.73 ml  Output 2550 ml  Net 10.73 ml    Intake/Output this shift: No intake/output data recorded.  Labs: Recent Labs    01/19/19 1516 01/20/19 0539 01/21/19 0336  HGB 10.4* 8.9* 7.9*   Recent Labs    01/20/19 0539 01/21/19 0336  WBC 8.2 13.3*  RBC 2.86* 2.52*  HCT 26.4* 23.7*  PLT 127* 110*   Recent Labs    01/20/19 0539 01/21/19 0336  NA 136 139  K 3.8 3.7  CL 103 111  CO2 20* 21*  BUN 73* 58*  CREATININE 3.82* 3.30*  GLUCOSE 175* 210*  CALCIUM 8.7* 7.9*   Recent Labs    01/19/19 1645  INR 1.0     EXAM General - Patient is Alert, Appropriate and Oriented Extremity - ABD soft Neurovascular intact Sensation intact distally Incision: dressing C/D/I No cellulitis present Dressing/Incision - clean, dry, no drainage Motor Function - intact, moving foot and toes well on exam.   Past Medical History:  Diagnosis Date  . Cancer ([place])   . Chronic kidney disease   . Colon polyp   . Diabetes mellitus without complication ([place])   . Heart murmur 2008  . Hyperlipidemia   . Hypertension 1980  . Retinal detachment     Assessment/Plan: 1 Day Post-Op Procedure(s) (LRB): INTRAMEDULLARY (IM) NAIL  INTERTROCHANTRIC (Right) Principal Problem:   Closed right hip fracture (HCC) Active Problems:   Hip fracture (HCC)  Estimated body mass index is 20.97 kg/m as calculated from the following:   Height as of this encounter: 5\' 9"  (1.753 m).   Weight as of this encounter: 64.4 kg. Advance diet Up with therapy D/C IV fluids when tolerating po intake.  Labs reviewed this AM. Hg 7.9 this morning.  Will re-check Hg tomorrow morning. Continue to monitor kidney levels, BUN 58, Cr 3.30 this AM. Continue to work with PT.  Plan for d/c home on Saturday.  DVT Prophylaxis - Foot Pumps, TED hose and heparin Weight-Bearing as tolerated to right leg  J. Cameron Proud, PA-C [facility] 01/21/2019, 11:29 AM

## 2019-01-22 LAB — BASIC METABOLIC PANEL
Anion gap: 7 (ref 5–15)
BUN: 55 mg/dL — ABNORMAL HIGH (ref 8–23)
CO2: 20 mmol/L — ABNORMAL LOW (ref 22–32)
Calcium: 7.6 mg/dL — ABNORMAL LOW (ref 8.9–10.3)
Chloride: 111 mmol/L (ref 98–111)
Creatinine, Ser: 3.3 mg/dL — ABNORMAL HIGH (ref 0.61–1.24)
GFR calc Af Amer: 19 mL/min — ABNORMAL LOW (ref >60)
GFR calc non Af Amer: 16 mL/min — ABNORMAL LOW (ref >60)
Glucose, Bld: 197 mg/dL — ABNORMAL HIGH (ref 70–99)
Potassium: 3.5 mmol/L (ref 3.5–5.1)
Sodium: 138 mmol/L (ref 135–145)

## 2019-01-22 LAB — CBC
HCT: 21.1 % — ABNORMAL LOW (ref 39.0–52.0)
Hemoglobin: 7 g/dL — ABNORMAL LOW (ref 13.0–17.0)
MCH: 31.7 pg (ref 26.0–34.0)
MCHC: 33.2 g/dL (ref 30.0–36.0)
MCV: 95.5 fL (ref 80.0–100.0)
Platelets: 103 10*3/uL — ABNORMAL LOW (ref 150–400)
RBC: 2.21 MIL/uL — ABNORMAL LOW (ref 4.22–5.81)
RDW: 12.9 % (ref 11.5–15.5)
WBC: 8.7 10*3/uL (ref 4.0–10.5)
nRBC: 0 % (ref 0.0–0.2)

## 2019-01-22 LAB — HEMOGLOBIN AND HEMATOCRIT, BLOOD
HCT: 27.4 % — ABNORMAL LOW (ref 39.0–52.0)
Hemoglobin: 9 g/dL — ABNORMAL LOW (ref 13.0–17.0)

## 2019-01-22 LAB — GLUCOSE, CAPILLARY
Glucose-Capillary: 149 mg/dL — ABNORMAL HIGH (ref 70–99)
Glucose-Capillary: 185 mg/dL — ABNORMAL HIGH (ref 70–99)
Glucose-Capillary: 271 mg/dL — ABNORMAL HIGH (ref 70–99)
Glucose-Capillary: 185 mg/dL — ABNORMAL HIGH (ref 70–99)

## 2019-01-22 LAB — PREPARE RBC (CROSSMATCH)

## 2019-01-22 LAB — ABO/RH: ABO/RH(D): A POS

## 2019-01-22 MED ORDER — SODIUM CHLORIDE 0.9% IV SOLUTION
Freq: Once | INTRAVENOUS | Status: AC
  Administered 2019-01-22: 12:00:00 via INTRAVENOUS

## 2019-01-22 NOTE — Progress Notes (Signed)
Physical Therapy Treatment Patient Details Name: [name] MRN: [id number] DOB: Apr 26, 1932 Today's Date: 01/22/2019    History of Present Illness Pt. is an 85 y.o. male who was admitted to [facility] with a Basicervical Femoral Neck Fracture, Pt. underwent an IM Nailing of the Right Hip Fx with Cephalomedullary device. Pt. PMHx includes: CA, CKD, Colon Polyp, DM, Heart Murmur, Hyperlipidemia, HTN, and Retinal Detachment.    PT Comments    S/p blood transfusion this AM.  Improved activity tolerance and gait efforts this date, progressing gait distance to 12' with RW, min/mod assist.  Step by step cuing for sequence/technique, but good adherence and effort.  Distance limited by pain/fatigue; will continue to progress as able.    Follow Up Recommendations  SNF     Equipment Recommendations  Rolling walker with 5" wheels;3in1 (PT)    Recommendations for Other Services       Precautions / Restrictions Precautions Precautions: Fall Restrictions Weight Bearing Restrictions: Yes RLE Weight Bearing: Weight bearing as tolerated    Mobility  Bed Mobility Overal bed mobility: Needs Assistance Bed Mobility: Supine to Sit     Supine to sit: Mod assist        Transfers Overall transfer level: Needs assistance Equipment used: Rolling walker (2 wheeled) Transfers: Sit to/from Stand Sit to Stand: Mod assist         General transfer comment: assist for hand/foot placement, weight shift/anterior weight translation and lift off/balance  Ambulation/Gait Ambulation/Gait assistance: Min assist;Mod assist Gait Distance (Feet): 12 Feet Assistive device: Rolling walker (2 wheeled)       General Gait Details: 3-point, step to gait pattern with chair follow; heavy WBing bilat UEs; constant assist for balance throughout.  Distance limited by pain/fatigue.   Stairs             Wheelchair Mobility    Modified Rankin (Stroke Patients Only)       Balance Overall balance  assessment: Needs assistance Sitting-balance support: No upper extremity supported;Feet supported Sitting balance-Leahy Scale: Fair Sitting balance - Comments: static sitting with close sup this date   Standing balance support: Bilateral upper extremity supported Standing balance-Leahy Scale: Poor                              Cognition Arousal/Alertness: Awake/alert Behavior During Therapy: WFL for tasks assessed/performed Overall Cognitive Status: Within Functional Limits for tasks assessed                                        Exercises Other Exercises Other Exercises: Seated LE therex, 1x10, active ROM: ankle pumps, LAQs, iso hip adduct, marching.  Fair active movement, limtied by pain-tolerable range    General Comments        Pertinent Vitals/Pain Pain Assessment: 0-10 Pain Score: 3  Pain Descriptors / Indicators: Constant;Sore;Grimacing Pain Intervention(s): Limited activity within patient's tolerance;Monitored during session;Repositioned    Home Living                      Prior Function            PT Goals (current goals can now be found in the care plan section) Acute Rehab PT Goals Patient Stated Goal: To regain independence PT Goal Formulation: With patient Time For Goal Achievement: 02/04/19 Potential to Achieve Goals: Good Progress towards PT  goals: Progressing toward goals    Frequency    BID      PT Plan Current plan remains appropriate    Co-evaluation              AM-PAC PT "6 Clicks" Mobility   Outcome Measure  Help needed turning from your back to your side while in a flat bed without using bedrails?: A Little Help needed moving from lying on your back to sitting on the side of a flat bed without using bedrails?: A Little Help needed moving to and from a bed to a chair (including a wheelchair)?: A Lot Help needed standing up from a chair using your arms (e.g., wheelchair or bedside chair)?: A  Lot Help needed to walk in hospital room?: A Lot Help needed climbing 3-5 steps with a railing? : A Lot 6 Click Score: 14    End of Session Equipment Utilized During Treatment: Gait belt Activity Tolerance: Patient tolerated treatment well Patient left: in chair;with call bell/phone within reach;with chair alarm set Nurse Communication: Mobility status PT Visit Diagnosis: History of falling (Z91.81);Muscle weakness (generalized) (M62.81);Difficulty in walking, not elsewhere classified (R26.2);Pain Pain - Right/Left: Right Pain - part of body: Hip     Time: 1610-9604 PT Time Calculation (min) (ACUTE ONLY): 23 min  Charges:  $Gait Training: 8-22 mins $Therapeutic Exercise: 8-22 mins                     Caelie [place] H. Owens Shark, PT, DPT, NCS 01/22/19, 5:04 PM [phone]

## 2019-01-22 NOTE — Progress Notes (Signed)
Subjective: 2 Days Post-Op Procedure(s) (LRB): INTRAMEDULLARY (IM) NAIL INTERTROCHANTRIC (Right) Patient reports pain as mild.   Patient is well, and has had no acute complaints or problems Plan is to go home on Saturday with HHPT. Negative for chest pain and shortness of breath Fever: no Gastrointestinal:Negative for nausea and vomiting  Objective: Vital signs in last 24 hours: Temp:  [98.4 F (36.9 C)-99 F (37.2 C)] 99 F (37.2 C) (08/14 0005) Pulse Rate:  [70-75] 73 (08/14 0005) Resp:  [16-18] 16 (08/14 0005) BP: (117-135)/(48-70) 135/64 (08/14 0005) SpO2:  [94 %-98 %] 94 % (08/14 0005)  Intake/Output from previous day:  Intake/Output Summary (Last 24 hours) at 01/22/2019 0700 Last data filed at 01/22/2019 0548 Gross per 24 hour  Intake 507.38 ml  Output 1250 ml  Net -742.62 ml    Intake/Output this shift: Total I/O In: -  Out: 950 [Urine:950]  Labs: Recent Labs    01/19/19 1516 01/20/19 0539 01/21/19 0336 01/22/19 0403  HGB 10.4* 8.9* 7.9* 7.0*   Recent Labs    01/21/19 0336 01/22/19 0403  WBC 13.3* 8.7  RBC 2.52* 2.21*  HCT 23.7* 21.1*  PLT 110* 103*   Recent Labs    01/21/19 0336 01/22/19 0403  NA 139 138  K 3.7 3.5  CL 111 111  CO2 21* 20*  BUN 58* 55*  CREATININE 3.30* 3.30*  GLUCOSE 210* 197*  CALCIUM 7.9* 7.6*   Recent Labs    01/19/19 1645  INR 1.0     EXAM General - Patient is Alert, Appropriate and Oriented Extremity - ABD soft Neurovascular intact Sensation intact distally Incision: dressing C/D/I No cellulitis present Dressing/Incision - clean, dry, no drainage Motor Function - intact, moving foot and toes well on exam.   Past Medical History:  Diagnosis Date  . Cancer ([place])   . Chronic kidney disease   . Colon polyp   . Diabetes mellitus without complication ([place])   . Heart murmur 2008  . Hyperlipidemia   . Hypertension 1980  . Retinal detachment     Assessment/Plan: 2 Days Post-Op Procedure(s)  (LRB): INTRAMEDULLARY (IM) NAIL INTERTROCHANTRIC (Right) Principal Problem:   Closed right hip fracture (HCC) Active Problems:   Hip fracture (HCC)  Estimated body mass index is 20.97 kg/m as calculated from the following:   Height as of this encounter: 5\' 9"  (1.753 m).   Weight as of this encounter: 64.4 kg. Advance diet Up with therapy D/C IV fluids when tolerating po intake.  Labs reviewed this AM. Hg 7.0 this morning.  We will transfuse 1 unit.  Will re-check Hg tomorrow morning. Continue to work with PT.  Plan for d/c home on Saturday.  DVT Prophylaxis - Foot Pumps, TED hose and heparin Weight-Bearing as tolerated to right leg  Reche Dixon, PA-C [facility] 01/22/2019, 7:00 AM

## 2019-01-22 NOTE — Care Management Important Message (Signed)
Important Message  Patient Details  Name: [name] MRN: [id number] Date of Birth: 1931/06/22   Medicare Important Message Given:  Yes     Dannette Barbara 01/22/2019, 10:23 AM

## 2019-01-22 NOTE — Progress Notes (Signed)
PT Cancellation Note  Patient Details Name: GLYNDON TURSI MRN: [id number] DOB: [birth year]   Cancelled Treatment:    Reason Eval/Treat Not Completed: (Treatment session attempted. Patient currently with OT for evaluation; will re-attempt at later time/date as medically appropriate. Of note, patient pending blood transfusion this date (HgB 7.0).)   Medha Pippen H. Owens Shark, PT, DPT, NCS 01/22/19, 9:46 AM [phone]

## 2019-01-22 NOTE — Progress Notes (Signed)
[place] [facility] [place] NAME: [name]    MR#:  [id number]  DATE OF BIRTH:  06-Apr-1932  SUBJECTIVE:   Status post intramedullary nailing of the right hip.  Postop day #2.  Patient having some mild right hip pain.  Hemoglobin down to 7.0 today and will transfuse 1 unit packed red blood cells.  Denies any chest pain dizziness or shortness of breath.  REVIEW OF SYSTEMS:    Review of Systems  Constitutional: Negative for chills and fever.  HENT: Negative for congestion and tinnitus.   Eyes: Negative for blurred vision and double vision.  Respiratory: Negative for cough, shortness of breath and wheezing.   Cardiovascular: Negative for chest pain, orthopnea and PND.  Gastrointestinal: Negative for abdominal pain, diarrhea, nausea and vomiting.  Genitourinary: Negative for dysuria and hematuria.  Musculoskeletal: Positive for falls and joint pain (Right HIp).  Neurological: Negative for dizziness, sensory change and focal weakness.  All other systems reviewed and are negative.   Nutrition: Renal diet with fluid restrictions.  Tolerating Diet: Yes Tolerating PT: Await Eval.   DRUG ALLERGIES:   Allergies  Allergen Reactions   Meperidine Other (See Comments)    Severe bradycardia Severe bradycardia    Penicillins Rash    VITALS:  Blood pressure 135/71, pulse 75, temperature 98.1 F (36.7 C), temperature source Oral, resp. rate 16, height 5\' 9"  (1.753 m), weight 64.4 kg, SpO2 96 %.  PHYSICAL EXAMINATION:   Physical Exam  GENERAL:  85 y.o.-year-old patient lying in bed in no acute distress.  EYES: Pupils equal, round, reactive to light and accommodation. No scleral icterus. Extraocular muscles intact.  HEENT: Head atraumatic, normocephalic. Oropharynx and nasopharynx clear.  NECK:  Supple, no jugular venous distention. No thyroid enlargement, no tenderness.  LUNGS: Normal breath sounds bilaterally, no wheezing, rales, rhonchi. No  use of accessory muscles of respiration.  CARDIOVASCULAR: S1, S2 normal. No murmurs, rubs, or gallops.  ABDOMEN: Soft, nontender, nondistended. Bowel sounds present. No organomegaly or mass.  EXTREMITIES: No cyanosis, clubbing or edema b/l.   Right Hip dressing in place with no drainage or bleeding noted.  NEUROLOGIC: Cranial nerves II through XII are intact. No focal Motor or sensory deficits b/l.    PSYCHIATRIC: The patient is alert and oriented x 3.  SKIN: No obvious rash, lesion, or ulcer.    LABORATORY PANEL:   CBC Recent Labs  Lab 01/22/19 0403  WBC 8.7  HGB 7.0*  HCT 21.1*  PLT 103*   ------------------------------------------------------------------------------------------------------------------  Chemistries  Recent Labs  Lab 01/22/19 0403  NA 138  K 3.5  CL 111  CO2 20*  GLUCOSE 197*  BUN 55*  CREATININE 3.30*  CALCIUM 7.6*   ------------------------------------------------------------------------------------------------------------------  Cardiac Enzymes No results for input(s): TROPONINI in the last 168 hours. ------------------------------------------------------------------------------------------------------------------  RADIOLOGY:  Dg Hip Operative Unilat W Or W/o Pelvis Right  Result Date: 01/20/2019 CLINICAL DATA:  Hip fracture EXAM: OPERATIVE right HIP (WITH PELVIS IF PERFORMED)  VIEWS TECHNIQUE: Fluoroscopic spot image(s) were submitted for interpretation post-operatively. COMPARISON:  01/19/2019 FINDINGS: Ten low resolution intraoperative spot views of the right hip. Total fluoroscopy time was 2 minutes 2 seconds. Acute right fracture at the base of the femoral neck. Subsequent images demonstrate intramedullary rod and screw fixation of the right femur with anatomic alignment IMPRESSION: Intraoperative fluoroscopic assistance provided during surgical fixation of right hip fracture Electronically Signed   By: Donavan Foil M.D.   On: 01/20/2019 18:31  ASSESSMENT AND PLAN:   85 year old male with past medical history of CKD stage IV, diabetes, hypertension, hyperlipidemia who presented to the hospital after a mechanical fall noted to have right hip fracture.  1.  Status post fall and right hip fracture- seen by orthopedics status post right hip intra-medullary pinning postop day #2 today. - Continue pain control as needed oxycodone and tramadol. - Tolerating PT well and likely discharge home tomorrow with Home Health and pt. Has around the clock care are home.   2.  CKD stage IV-patient's creatinine is close to baseline we will continue to monitor. - As per the patient and patient's daughter-in-law who is the POA patient does not want to be on dialysis and no acute indication presently. - cont. Sodium bicarb  3. Anemia  - combination of acute blood loss anemia from recent surgery and anemia of chronic disease - Hg. Down to 7 today and will transfuse 1 unit and follow hg.   4.  Diabetes type 2 without complication-continue sliding scale insulin, follow blood sugars. - BS stable.   5.  Essential hypertension-continue losartan, Norvasc  6.  BPH-continue Avodart, Flomax. - no urinary retention.   7. GERD - cont. Protonix.   Likely discharge home tomorrow with Home Health services.   All the records are reviewed and case discussed with Care Management/Social Worker. Management plans discussed with the patient, family and they are in agreement.  CODE STATUS: DNR  DVT Prophylaxis: Will start Lovenox post-operatively   TOTAL TIME TAKING CARE OF THIS PATIENT: 30 minutes.   POSSIBLE D/C IN 1-2 DAYS, DEPENDING ON CLINICAL CONDITION.   Henreitta Leber M.D on 01/22/2019 at 2:10 PM  Between 7am to 6pm - Pager - [phone]  After 6pm go to [email or web address] - Proofreader  [facility]  Office  [phone]  CC: Primary care physician; [name], MD

## 2019-01-22 NOTE — Progress Notes (Signed)
Occupational Therapy Treatment Patient Details Name: [name] MRN: [id number] DOB: 04/26/1932 Today's Date: 01/22/2019    History of present illness Pt. is an 85 y.o. male who was admitted to [facility] with a Basicervical Femoral Neck Fracture, Pt. underwent an IM Nailing of the Right Hip Fx with Cephalomedullary device. Pt. PMHx includes: CA, CKD, Colon Polyp, DM, Heart Murmur, Hyperlipidemia, HTN, and Retinal Detachment.   OT comments  Pt seen for OT treatment on this date. Upon arrival to room pt awake/alert semi-supine in bed. Pt A&O x4 and reporting no pain at start of session. Pt agreeable to OT tx. Pt provided with handout and brief overview of AE for LB bathing and dressing tasks after a hip sx. Pt noted to have good recall of information provided at initial OT evaluation. Pt agreeable to attempt functional mobility. This author assisted pt with bed mobility with goal to get to room recliner on this date. Pt required min assist for sup>sit with cues for hand placement and mgt of RLE. Pt able to come EOB and maintain seated balance with at least 1 UE support. Pt attempted STS x2 on this date given mod assist from this therapist. Was unable to come to full stand and unsafe to attempt further transfer to bed as pt stated he was beginning to feel "faint". Pt able to complete sit>sup with moderate assistance from this OT. Pt required +2 assist for boost up in bed. Afterward, OT assisted NT with bedding change and noted pt was able to roll independently to his L side and required min A to roll onto his R side on this date.  Pt making good progress toward goals and continues to benefit from skilled OT services to maximize return to PLOF and minimize risk of future falls, injury, caregiver burden, and readmission. Will continue to follow POC as written. Discharge recommendation remains appropriate.    Follow Up Recommendations  SNF    Equipment Recommendations       Recommendations for Other Services       Precautions / Restrictions Precautions Precautions: Fall Restrictions Weight Bearing Restrictions: Yes RLE Weight Bearing: Weight bearing as tolerated       Mobility Bed Mobility Overal bed mobility: Needs Assistance Bed Mobility: Supine to Sit;Sit to Supine;Rolling Rolling: Min assist   Supine to sit: Min assist Sit to supine: Mod assist;Max assist   General bed mobility comments: Pt endorsed feeling weak and faint after sitting EOB for ~5 min on this date. Required mod-max assist for mgt of BLE back to bed. +2 total assist for boost in bed. Min assist to roll onto R side during bedding change.  Transfers Overall transfer level: Needs assistance Equipment used: Rolling walker (2 wheeled) Transfers: Sit to/from Stand Sit to Stand: Mod assist         General transfer comment: Pt attempted x2 to come to standing using RW on this date given mod assist from this therapist. Pt unable to come into full stand. Posterior lean noted on this date.    Balance Overall balance assessment: Needs assistance Sitting-balance support: No upper extremity supported;Feet supported Sitting balance-Leahy Scale: Fair   Postural control: Posterior lean   Standing balance-Leahy Scale: Poor Standing balance comment: bilat UE support, +1 at all times                           ADL either performed or assessed with clinical judgement   ADL Overall ADL's :  Needs assistance/impaired                                       General ADL Comments: Pt had good recall of AE education on this date. Upon arrival to room was observed to have spilled coffee from breakfast tray on floor and bed. Pt stated "Sometimes my arm just gives out". Will continue to assess self-feeding at future sessions.     Vision Baseline Vision/History: Wears glasses Wears Glasses: At all times Patient Visual Report: No change from baseline     Perception     Praxis      Cognition  Arousal/Alertness: Awake/alert Behavior During Therapy: WFL for tasks assessed/performed Overall Cognitive Status: Within Functional Limits for tasks assessed                                          Exercises Other Exercises Other Exercises: OT provided reinforcement on prior AE education and safe transfer/bed mobility strategies.   Shoulder Instructions       General Comments      Pertinent Vitals/ Pain       Pain Score: 5  Pain Location: 0/10 at rest. 5/10 with functional mobility attempts. R hip Pain Descriptors / Indicators: Constant;Sore;Grimacing Pain Intervention(s): Limited activity within patient's tolerance;Monitored during session;Repositioned  Home Living                                          Prior Functioning/Environment              Frequency  Min 2X/week        Progress Toward Goals  OT Goals(current goals can now be found in the care plan section)  Progress towards OT goals: Progressing toward goals  Acute Rehab OT Goals Patient Stated Goal: To regain independence OT Goal Formulation: With patient Potential to Achieve Goals: Good  Plan Discharge plan remains appropriate;Frequency remains appropriate    Co-evaluation                 AM-PAC OT "6 Clicks" Daily Activity     Outcome Measure   Help from another person eating meals?: None Help from another person taking care of personal grooming?: A Little Help from another person toileting, which includes using toliet, bedpan, or urinal?: A Little Help from another person bathing (including washing, rinsing, drying)?: A Lot Help from another person to put on and taking off regular upper body clothing?: A Little Help from another person to put on and taking off regular lower body clothing?: A Lot 6 Click Score: 17    End of Session Equipment Utilized During Treatment: Gait belt;Rolling walker  OT Visit Diagnosis: Unsteadiness on feet  (R26.81);Muscle weakness (generalized) (M62.81)   Activity Tolerance Patient limited by fatigue;Patient tolerated treatment well   Patient Left in bed;with call bell/phone within reach;with bed alarm set;with SCD's reapplied   Nurse Communication Mobility status        Time: 8413-2440 OT Time Calculation (min): 33 min  Charges: OT General Charges $OT Visit: 1 Visit OT Treatments $Self Care/Home Management : 23-37 mins  Shara Blazing, M.S., OTR/L Ascom: [phone] 01/22/19, 11:54 AM

## 2019-01-23 LAB — BASIC METABOLIC PANEL
Anion gap: 7 (ref 5–15)
BUN: 50 mg/dL — ABNORMAL HIGH (ref 8–23)
CO2: 20 mmol/L — ABNORMAL LOW (ref 22–32)
Calcium: 7.8 mg/dL — ABNORMAL LOW (ref 8.9–10.3)
Chloride: 112 mmol/L — ABNORMAL HIGH (ref 98–111)
Creatinine, Ser: 2.99 mg/dL — ABNORMAL HIGH (ref 0.61–1.24)
GFR calc Af Amer: 21 mL/min — ABNORMAL LOW (ref >60)
GFR calc non Af Amer: 18 mL/min — ABNORMAL LOW (ref >60)
Glucose, Bld: 145 mg/dL — ABNORMAL HIGH (ref 70–99)
Potassium: 3.5 mmol/L (ref 3.5–5.1)
Sodium: 139 mmol/L (ref 135–145)

## 2019-01-23 LAB — CBC
HCT: 23.3 % — ABNORMAL LOW (ref 39.0–52.0)
Hemoglobin: 7.9 g/dL — ABNORMAL LOW (ref 13.0–17.0)
MCH: 30.7 pg (ref 26.0–34.0)
MCHC: 33.9 g/dL (ref 30.0–36.0)
MCV: 90.7 fL (ref 80.0–100.0)
Platelets: 125 10*3/uL — ABNORMAL LOW (ref 150–400)
RBC: 2.57 MIL/uL — ABNORMAL LOW (ref 4.22–5.81)
RDW: 15.9 % — ABNORMAL HIGH (ref 11.5–15.5)
WBC: 10.7 10*3/uL — ABNORMAL HIGH (ref 4.0–10.5)
nRBC: 0 % (ref 0.0–0.2)

## 2019-01-23 LAB — TYPE AND SCREEN
ABO/RH(D): A POS
Antibody Screen: NEGATIVE
Unit division: 0

## 2019-01-23 LAB — BPAM RBC
Blood Product Expiration Date: 202008252359
ISSUE DATE / TIME: 202008141113
Unit Type and Rh: 6200

## 2019-01-23 LAB — GLUCOSE, CAPILLARY
Glucose-Capillary: 144 mg/dL — ABNORMAL HIGH (ref 70–99)
Glucose-Capillary: 171 mg/dL — ABNORMAL HIGH (ref 70–99)

## 2019-01-23 MED ORDER — TRAMADOL HCL 50 MG PO TABS: 50.0000 mg | ORAL_TABLET | Freq: Four times a day (QID) | ORAL | 1 refills | Status: DC | PRN

## 2019-01-23 MED ORDER — TRAMADOL HCL 50 MG PO TABS
50.0000 mg | ORAL_TABLET | Freq: Two times a day (BID) | ORAL | Status: DC | PRN
  Administered 2019-01-23: 50 mg via ORAL
  Filled 2019-01-23: qty 1

## 2019-01-23 MED ORDER — HEPARIN SODIUM (PORCINE) 5000 UNIT/ML IJ SOLN: INTRAMUSCULAR | 0 refills | Status: DC

## 2019-01-23 MED ORDER — ACETAMINOPHEN 325 MG PO TABS: 650.0000 mg | ORAL_TABLET | Freq: Four times a day (QID) | ORAL | 2 refills | Status: DC | PRN

## 2019-01-23 MED ORDER — OXYCODONE HCL 5 MG PO TABS: 2.5000 mg | ORAL_TABLET | ORAL | 0 refills | Status: DC | PRN

## 2019-01-23 MED ORDER — TRAMADOL HCL 50 MG PO TABS: 50.0000 mg | ORAL_TABLET | Freq: Two times a day (BID) | ORAL | 1 refills | Status: DC | PRN

## 2019-01-23 MED ORDER — LOSARTAN POTASSIUM 25 MG PO TABS: 12.5000 mg | ORAL_TABLET | Freq: Two times a day (BID) | ORAL | Status: AC

## 2019-01-23 NOTE — Progress Notes (Signed)
Physical Therapy Treatment Patient Details Name: [name] MRN: [id number] DOB: 06/01/1932 Today's Date: 01/23/2019    History of Present Illness Pt. is an 85 y.o. male who was admitted to [facility] with a Basicervical Femoral Neck Fracture, Pt. underwent an IM Nailing of the Right Hip Fx with Cephalomedullary device. Pt. PMHx includes: CA, CKD, Colon Polyp, DM, Heart Murmur, Hyperlipidemia, HTN, and Retinal Detachment.    PT Comments    Pt in recliner, fatigued and ready to get back to bed.  Stood with mod a x 1.  He was able to move RLE for marching and SLR but hesitant to WB.  Returned to sitting and chair was put closer to bed.  Stood again with mod a x 1 and he was able to shimmy his feet towards bed but before turning fully had a post LOB he was unable to correct and was guided to sitting by Probation officer.  To supine with mod a x 1.  Pt reported general increased soreness.  Tech in and to relay to RN request for pain medication.     Follow Up Recommendations  SNF     Equipment Recommendations  Rolling walker with 5" wheels;3in1 (PT)    Recommendations for Other Services       Precautions / Restrictions Precautions Precautions: Fall Restrictions Weight Bearing Restrictions: Yes RLE Weight Bearing: Weight bearing as tolerated    Mobility  Bed Mobility Overal bed mobility: Needs Assistance Bed Mobility: Sit to Supine Rolling: Min assist   Supine to sit: Min assist Sit to supine: Min assist;Mod assist   General bed mobility comments: LE management  Transfers Overall transfer level: Needs assistance Equipment used: Rolling walker (2 wheeled) Transfers: Sit to/from Stand Sit to Stand: Mod assist         General transfer comment: mod a from lower recliner  Ambulation/Gait Ambulation/Gait assistance: Min assist;Mod assist Gait Distance (Feet): 2 Feet Assistive device: Rolling walker (2 wheeled) Gait Pattern/deviations: Step-to pattern     General Gait Details: unable to  walk in room today but was able to transfer back to bed with poor transfer falling backwards on bed which was controlled by Probation officer.   Stairs             Wheelchair Mobility    Modified Rankin (Stroke Patients Only)       Balance Overall balance assessment: Needs assistance Sitting-balance support: No upper extremity supported;Feet supported Sitting balance-Leahy Scale: Fair     Standing balance support: Bilateral upper extremity supported Standing balance-Leahy Scale: Poor Standing balance comment: assist from writer at all times to prevent falls.                            Cognition Arousal/Alertness: Awake/alert Behavior During Therapy: WFL for tasks assessed/performed Overall Cognitive Status: Within Functional Limits for tasks assessed                                        Exercises Other Exercises Other Exercises: Seated LE therex, 1x10, active ROM: ankle pumps, LAQs, iso hip adduct, marching.  Fair active movement, limtied by pain-tolerable range Other Exercises: pt inc large BM and care was provided as appropriate.    General Comments        Pertinent Vitals/Pain Pain Assessment: Faces Faces Pain Scale: Hurts even more Pain Location: R hip Pain Descriptors /  Indicators: Aching;Guarding;Sore Pain Intervention(s): Monitored during session;Repositioned;Limited activity within patient's tolerance    Home Living                      Prior Function            PT Goals (current goals can now be found in the care plan section) Progress towards PT goals: Progressing toward goals    Frequency    BID      PT Plan Current plan remains appropriate    Co-evaluation              AM-PAC PT "6 Clicks" Mobility   Outcome Measure  Help needed turning from your back to your side while in a flat bed without using bedrails?: A Little Help needed moving from lying on your back to sitting on the side of a flat bed  without using bedrails?: A Little Help needed moving to and from a bed to a chair (including a wheelchair)?: A Little Help needed standing up from a chair using your arms (e.g., wheelchair or bedside chair)?: A Little Help needed to walk in hospital room?: A Little Help needed climbing 3-5 steps with a railing? : A Lot 6 Click Score: 17    End of Session Equipment Utilized During Treatment: Gait belt Activity Tolerance: Patient tolerated treatment well;Patient limited by fatigue;Patient limited by pain Patient left: in bed;with call bell/phone within reach;with bed alarm set;with family/visitor present Nurse Communication: Other (comment);Patient requests pain meds Pain - Right/Left: Right Pain - part of body: Hip     Time: 5732-2025 PT Time Calculation (min) (ACUTE ONLY): 12 min  Charges:  $Gait Training: 8-22 mins $Therapeutic Activity: 8-22 mins                     Chesley Noon, PTA 01/23/19, 2:03 PM

## 2019-01-23 NOTE — Progress Notes (Signed)
Physical Therapy Treatment Patient Details Name: [name] MRN: [id number] DOB: May 02, 1932 Today's Date: 01/23/2019    History of Present Illness Pt. is an 85 y.o. male who was admitted to [facility] with a Basicervical Femoral Neck Fracture, Pt. underwent an IM Nailing of the Right Hip Fx with Cephalomedullary device. Pt. PMHx includes: CA, CKD, Colon Polyp, DM, Heart Murmur, Hyperlipidemia, HTN, and Retinal Detachment.    PT Comments    Pt inc large soft BM upon arrival.  Care provided and pt able to assist in rolling left/right.  To edge of bed with min a x 1.  Stood and transferred to recliner.  After seated rest and exercises, he was able to stand again and walk to the door and back.  Daughter in and pt remained in recliner after session.   Follow Up Recommendations  SNF     Equipment Recommendations  Rolling walker with 5" wheels;3in1 (PT)    Recommendations for Other Services       Precautions / Restrictions Precautions Precautions: Fall Restrictions Weight Bearing Restrictions: Yes RLE Weight Bearing: Weight bearing as tolerated    Mobility  Bed Mobility Overal bed mobility: Needs Assistance Bed Mobility: Supine to Sit Rolling: Min assist   Supine to sit: Min assist        Transfers Overall transfer level: Needs assistance Equipment used: Rolling walker (2 wheeled) Transfers: Sit to/from Stand Sit to Stand: Min assist;Mod assist            Ambulation/Gait Ambulation/Gait assistance: Herbalist (Feet): 25 Feet Assistive device: Rolling walker (2 wheeled) Gait Pattern/deviations: Step-through pattern;Decreased step length - right;Decreased step length - left;Narrow base of support         Stairs             Wheelchair Mobility    Modified Rankin (Stroke Patients Only)       Balance Overall balance assessment: Needs assistance Sitting-balance support: No upper extremity supported;Feet supported Sitting balance-Leahy Scale:  Fair     Standing balance support: Bilateral upper extremity supported Standing balance-Leahy Scale: Poor Standing balance comment: assist from writer at all times to prevent falls.                            Cognition Arousal/Alertness: Awake/alert Behavior During Therapy: WFL for tasks assessed/performed Overall Cognitive Status: Within Functional Limits for tasks assessed                                        Exercises Other Exercises Other Exercises: Seated LE therex, 1x10, active ROM: ankle pumps, LAQs, iso hip adduct, marching.  Fair active movement, limtied by pain-tolerable range Other Exercises: pt inc large BM and care was provided as appropriate.    General Comments        Pertinent Vitals/Pain Pain Assessment: Faces Faces Pain Scale: Hurts little more Pain Descriptors / Indicators: Aching;Guarding;Sore Pain Intervention(s): Limited activity within patient's tolerance;Monitored during session;Repositioned    Home Living                      Prior Function            PT Goals (current goals can now be found in the care plan section) Progress towards PT goals: Progressing toward goals    Frequency    BID  PT Plan Current plan remains appropriate    Co-evaluation              AM-PAC PT "6 Clicks" Mobility   Outcome Measure  Help needed turning from your back to your side while in a flat bed without using bedrails?: A Little Help needed moving from lying on your back to sitting on the side of a flat bed without using bedrails?: A Little Help needed moving to and from a bed to a chair (including a wheelchair)?: A Little Help needed standing up from a chair using your arms (e.g., wheelchair or bedside chair)?: A Little Help needed to walk in hospital room?: A Little Help needed climbing 3-5 steps with a railing? : A Lot 6 Click Score: 17    End of Session Equipment Utilized During Treatment: Gait  belt Activity Tolerance: Patient tolerated treatment well Patient left: in chair;with call bell/phone within reach;with chair alarm set;with family/visitor present Nurse Communication: Mobility status Pain - Right/Left: Right Pain - part of body: Hip     Time: 5686-1683 PT Time Calculation (min) (ACUTE ONLY): 23 min  Charges:  $Gait Training: 8-22 mins $Therapeutic Activity: 8-22 mins                     Chesley Noon, PTA 01/23/19, 12:44 PM

## 2019-01-23 NOTE — Discharge Summary (Signed)
[facility] NAME: [name]    MR#:  [id number]  DATE OF BIRTH:  09/11/1931  DATE OF ADMISSION:  01/19/2019 ADMITTING PHYSICIAN: [name] Basta, MD  DATE OF DISCHARGE: 01/23/2019  PRIMARY CARE PHYSICIAN: [name] Pink, MD    ADMISSION DIAGNOSIS:  AKI (acute kidney injury) ([name]) [N17.9] Pre-op exam [Z01.818] Closed fracture of neck of right femur, initial encounter ([place]) [S72.001A] Fall, initial encounter [W19.XXXA]  DISCHARGE DIAGNOSIS:  Principal Problem:   Closed right hip fracture (HCC) Active Problems:   Hip fracture ([name])   SECONDARY DIAGNOSIS:   Past Medical History:  Diagnosis Date  . Cancer ([name])   . Chronic kidney disease   . Colon polyp   . Diabetes mellitus without complication ([name])   . Heart murmur 2008  . Hyperlipidemia   . Hypertension 1980  . Retinal detachment     HOSPITAL COURSE:   1.  Right hip fracture status post operative repair with pinning procedure.  Patient will be discharged out to rehab.  Patient does not want narcotics and wanted tramadol.  Need to be careful with tramadol secondary to patient's kidney function.  Convert to Tylenol as quickly as possible.  Patient on heparin subcu for DVT prophylaxis for 2 weeks.  Check a CBC on a weekly basis while on this. 2.  Chronic kidney disease stage IV.  Continue sodium bicarb and low-dose losartan 3.  Anemia of chronic kidney disease and acute blood loss with the surgery.  Patient transfuse 1 unit of packed red blood cells during hospital course.  Hemoglobin 7.9 today.  Continue to monitor as outpatient. 4.  Type 2 diabetes mellitus.  Can go back on Tradjenta.  Check fingersticks q. before meals and nightly 5.  Essential hypertension on losartan and Norvasc 6.  BPH on Avodart Flomax combination 7.  GERD on PPI  DISCHARGE CONDITIONS:   Satisfactory  CONSULTS OBTAINED:  Treatment Team:  Leim Fabry, MD  DRUG ALLERGIES:   Allergies   Allergen Reactions  . Meperidine Other (See Comments)    Severe bradycardia Severe bradycardia   . Penicillins Rash    DISCHARGE MEDICATIONS:   Allergies as of 01/23/2019      Reactions   Meperidine Other (See Comments)   Severe bradycardia Severe bradycardia   Penicillins Rash      Medication List    STOP taking these medications   carvedilol 12.5 MG tablet Commonly known as: COREG     TAKE these medications   acetaminophen 325 MG tablet Commonly known as: Tylenol Take 2 tablets (650 mg total) by mouth every 6 (six) hours as needed for mild pain.   amLODipine 10 MG tablet Commonly known as: NORVASC Take 10 mg by mouth daily.   aspirin 81 MG EC tablet Take 1 tablet (81 mg total) by mouth daily.   B-Complex Tabs Take 1 tablet by mouth daily.   calcitRIOL 0.25 MCG capsule Commonly known as: ROCALTROL Take 0.25 mcg by mouth daily.   furosemide 20 MG tablet Commonly known as: LASIX Take 10-40 mg by mouth 2 (two) times daily. Take 40 mg by mouth in the morning, then 10 mg by mouth at lunch   heparin 5000 UNIT/ML injection Heparin 5000 units subcutaneous injection three times a day for fourteen days then discontinue   Iron 28 MG Tabs Take 1 tablet by mouth.   Jalyn 0.5-0.4 MG Caps Generic drug: Dutasteride-Tamsulosin HCl Take 1 capsule by mouth daily.   losartan 25  MG tablet Commonly known as: COZAAR Take 0.5 tablets (12.5 mg total) by mouth 2 (two) times daily. What changed: how much to take   multivitamin capsule Take 1 capsule by mouth daily.   sodium bicarbonate 650 MG tablet Take 650 mg by mouth 2 (two) times daily.   Tradjenta 5 MG Tabs tablet Generic drug: linagliptin Take 5 mg by mouth daily.   traMADol 50 MG tablet Commonly known as: ULTRAM Take 1 tablet (50 mg total) by mouth every 12 (twelve) hours as needed for moderate pain or severe pain.        DISCHARGE INSTRUCTIONS:   Patient will be discharged to rehab Follow-up with  orthopedics as outpatient  If you experience worsening of your admission symptoms, develop shortness of breath, life threatening emergency, suicidal or homicidal thoughts you must seek medical attention immediately by calling 911 or calling your MD immediately  if symptoms less severe.  You Must read complete instructions/literature along with all the possible adverse reactions/side effects for all the Medicines you take and that have been prescribed to you. Take any new Medicines after you have completely understood and accept all the possible adverse reactions/side effects.   Please note  You were cared for by a hospitalist during your hospital stay. If you have any questions about your discharge medications or the care you received while you were in the hospital after you are discharged, you can call the unit and asked to speak with the hospitalist on call if the hospitalist that took care of you is not available. Once you are discharged, your primary care physician will handle any further medical issues. Please note that NO REFILLS for any discharge medications will be authorized once you are discharged, as it is imperative that you return to your primary care physician (or establish a relationship with a primary care physician if you do not have one) for your aftercare needs so that they can reassess your need for medications and monitor your lab values.    Today   CHIEF COMPLAINT:   Chief Complaint  Patient presents with  . Fall    HISTORY OF PRESENT ILLNESS:  [name]  is a 85 y.o. male came in after a fall   VITAL SIGNS:  Blood pressure (!) 147/68, pulse 77, temperature 98.5 F (36.9 C), resp. rate 18, height 5\' 9"  (1.753 m), weight 64.4 kg, SpO2 97 %.    PHYSICAL EXAMINATION:  GENERAL:  86 y.o.-year-old patient lying in the bed with no acute distress.  EYES: Pupils equal, round, reactive to light and accommodation. No scleral icterus. Extraocular muscles intact.  HEENT:  Head atraumatic, normocephalic. Oropharynx and nasopharynx clear.  NECK:  Supple, no jugular venous distention. No thyroid enlargement, no tenderness.  LUNGS: Normal breath sounds bilaterally, no wheezing, rales,rhonchi or crepitation. No use of accessory muscles of respiration.  CARDIOVASCULAR: S1, S2 normal. No murmurs, rubs, or gallops.  ABDOMEN: Soft, non-tender, non-distended. Bowel sounds present. No organomegaly or mass.  EXTREMITIES: No pedal edema, cyanosis, or clubbing.  NEUROLOGIC: Cranial nerves II through XII are intact. Muscle strength 5/5 in all extremities. Sensation intact. Gait not checked.  Unable to straight leg raise with right leg at this point. PSYCHIATRIC: The patient is alert and oriented x 3.  SKIN: No obvious rash, lesion, or ulcer.  Bruising over arms and legs  DATA REVIEW:   CBC Recent Labs  Lab 01/23/19 0410  WBC 10.7*  HGB 7.9*  HCT 23.3*  PLT 125*  Chemistries  Recent Labs  Lab 01/23/19 0410  NA 139  K 3.5  CL 112*  CO2 20*  GLUCOSE 145*  BUN 50*  CREATININE 2.99*  CALCIUM 7.8*     Microbiology Results  Results for orders placed or performed during the hospital encounter of 01/19/19  SARS Coronavirus 2 [facility] order, Performed in [facility] hospital lab) Nasopharyngeal Nasopharyngeal Swab     Status: None   Collection Time: 01/19/19  5:37 PM   Specimen: Nasopharyngeal Swab  Result Value Ref Range Status   SARS Coronavirus 2 NEGATIVE NEGATIVE Final    Comment: (NOTE) If result is NEGATIVE SARS-CoV-2 target nucleic acids are NOT DETECTED. The SARS-CoV-2 RNA is generally detectable in upper and lower  respiratory specimens during the acute phase of infection. The lowest  concentration of SARS-CoV-2 viral copies this assay can detect is 250  copies / mL. A negative result does not preclude SARS-CoV-2 infection  and should not be used as the sole basis for treatment or other  patient management decisions.  A negative result may occur  with  improper specimen collection / handling, submission of specimen other  than nasopharyngeal swab, presence of viral mutation(s) within the  areas targeted by this assay, and inadequate number of viral copies  (<250 copies / mL). A negative result must be combined with clinical  observations, patient history, and epidemiological information. If result is POSITIVE SARS-CoV-2 target nucleic acids are DETECTED. The SARS-CoV-2 RNA is generally detectable in upper and lower  respiratory specimens dur ing the acute phase of infection.  Positive  results are indicative of active infection with SARS-CoV-2.  Clinical  correlation with patient history and other diagnostic information is  necessary to determine patient infection status.  Positive results do  not rule out bacterial infection or co-infection with other viruses. If result is PRESUMPTIVE POSTIVE SARS-CoV-2 nucleic acids MAY BE PRESENT.   A presumptive positive result was obtained on the submitted specimen  and confirmed on repeat testing.  While 2019 novel coronavirus  (SARS-CoV-2) nucleic acids may be present in the submitted sample  additional confirmatory testing may be necessary for epidemiological  and / or clinical management purposes  to differentiate between  SARS-CoV-2 and other Sarbecovirus currently known to infect humans.  If clinically indicated additional testing with an alternate test  methodology [phone]) is advised. The SARS-CoV-2 RNA is generally  detectable in upper and lower respiratory sp ecimens during the acute  phase of infection. The expected result is Negative. Fact Sheet for Patients:  StrictlyIdeas.no Fact Sheet for Healthcare Providers: BankingDealers.co.za This test is not yet approved or cleared by the [place] FDA and has been authorized for detection and/or diagnosis of SARS-CoV-2 by FDA under an Emergency Use Authorization (EUA).  This EUA  will remain in effect (meaning this test can be used) for the duration of the COVID-19 declaration under Section 564(b)(1) of the Act, 21 U.S.C. section 360bbb-3(b)(1), unless the authorization is terminated or revoked sooner. Performed at [facility], [address] [place], [place] [address]   MRSA PCR Screening     Status: None   Collection Time: 01/19/19  8:38 PM   Specimen: Nasal Mucosa; Nasopharyngeal  Result Value Ref Range Status   MRSA by PCR NEGATIVE NEGATIVE Final    Comment:        The GeneXpert MRSA Assay (FDA approved for NASAL specimens only), is one component of a comprehensive MRSA colonization surveillance program. It is not intended to diagnose MRSA  infection nor to guide or monitor treatment for MRSA infections. Performed at [facility], [address] [place], [place] [address]       Management plans discussed with the patient, family and they are in agreement.  CODE STATUS:     Code Status Orders  (From admission, onward)         Start     Ordered   01/19/19 2040  Do not attempt resuscitation (DNR)  Continuous    Question Answer Comment  In the event of cardiac or respiratory ARREST Do not call a "code blue"   In the event of cardiac or respiratory ARREST Do not perform Intubation, CPR, defibrillation or ACLS   In the event of cardiac or respiratory ARREST Use medication by any route, position, wound care, and other measures to relive pain and suffering. May use oxygen, suction and manual treatment of airway obstruction as needed for comfort.      01/19/19 2039        Code Status History    Date Active Date Inactive Code Status Order ID Comments User Context   09/05/2018 1549 09/07/2018 1552 DNR [id number]  Sela Hua, MD Inpatient   04/19/2018 1826 04/21/2018 2011 DNR [id number]  [name] Basta, MD Inpatient   Advance Care Planning Activity      TOTAL TIME TAKING CARE OF THIS PATIENT: 35 minutes.     Loletha Grayer M.D on 01/23/2019 at 10:47 AM  Between 7am to 6pm - Pager - [phone]  After 6pm go to [email or web address] - password [email or web address] [facility]  [phone]  CC: Primary care physician; [name] Pink, MD

## 2019-01-23 NOTE — TOC Transition Note (Signed)
Transition of Care [facility]) - CM/SW Discharge Note   Patient Details  Name: [name] MRN: [id number] Date of Birth: July 01, 1931  [facility] CM/SW Contact:  Shelbie Hutching, RN Phone Number: 01/23/2019, 11:05 AM   Clinical Narrative:    Patient ready for discharge today and he will go to [facility].  Daughter in [name] is at the bedside, she is HCPOA.  Patient will go to room 317, he does not need a repeat COVID.  Bedside RN will call report to [phone].  Patient will transport via [employer].    Final next level of care: [place] Barriers to Discharge: Continued Medical Work up   Patient Goals and CMS Choice Patient states their goals for this hospitalization and ongoing recovery are:: [facility] and family support CMS Medicare.gov Compare Post Acute Care list provided to:: Patient Represenative (must comment)(HCPOA- daughter in law) Choice offered to / list presented to : [place] / [place]  Discharge Placement              Patient chooses bed at: [facility] Patient to be transferred to facility by: [facility] Name of family member notified: Ronie Spies Patient and family notified of of transfer: 01/23/19  Discharge Plan and Services   Discharge Planning Services: CM Consult Post Acute Care Choice: [place]          DME Arranged: N/A                    Social Determinants of Health ([place]) Interventions     Readmission Risk Interventions Readmission Risk Prevention Plan 01/21/2019  Transportation Screening Complete  PCP or Specialist Appt within 3-5 Days Complete  HRI or Home Care Consult Complete  Palliative Care Screening Not Applicable  Medication Review (RN Care Manager) Complete  Some recent data might be hidden

## 2019-01-23 NOTE — Progress Notes (Signed)
EMS arrived to transport patient to [facility], room 317. Updated VS obtained and shared with EMS staff. Personal belongings sent with EMS for patient, due to patient's daughter not being allowed into the facility. IV previously removed. Patient D/C

## 2019-01-23 NOTE — Progress Notes (Signed)
I attempted to call report to [facility] five times total trying different numbers and was unsuccessful. No one would answer the phone. EMS called to transport patient.

## 2019-01-23 NOTE — Progress Notes (Signed)
Subjective: 3 Days Post-Op Procedure(s) (LRB): INTRAMEDULLARY (IM) NAIL INTERTROCHANTRIC (Right) Patient reports pain as mild.   Patient is well, and has had no acute complaints or problems Possible discharge home today with HHPT. Negative for chest pain and shortness of breath Fever: no Gastrointestinal:Negative for nausea and vomiting  Objective: Vital signs in last 24 hours: Temp:  [98.1 F (36.7 C)-99.8 F (37.7 C)] 99.8 F (37.7 C) (08/14 2304) Pulse Rate:  [72-80] 80 (08/14 2304) Resp:  [16-20] 19 (08/14 2304) BP: (133-146)/(56-71) 146/64 (08/14 2304) SpO2:  [96 %-100 %] 96 % (08/14 2304)  Intake/Output from previous day:  Intake/Output Summary (Last 24 hours) at 01/23/2019 0656 Last data filed at 01/23/2019 0418 Gross per 24 hour  Intake 2568.03 ml  Output 1350 ml  Net 1218.03 ml    Intake/Output this shift: Total I/O In: -  Out: 400 [Urine:400]  Labs: Recent Labs    01/21/19 0336 01/22/19 0403 01/22/19 1519 01/23/19 0410  HGB 7.9* 7.0* 9.0* 7.9*   Recent Labs    01/22/19 0403 01/22/19 1519 01/23/19 0410  WBC 8.7  --  10.7*  RBC 2.21*  --  2.57*  HCT 21.1* 27.4* 23.3*  PLT 103*  --  125*   Recent Labs    01/22/19 0403 01/23/19 0410  NA 138 139  K 3.5 3.5  CL 111 112*  CO2 20* 20*  BUN 55* 50*  CREATININE 3.30* 2.99*  GLUCOSE 197* 145*  CALCIUM 7.6* 7.8*   No results for input(s): LABPT, INR in the last 72 hours.   EXAM General - Patient is Alert, Appropriate and Oriented Extremity - ABD soft Neurovascular intact Sensation intact distally Incision: dressing C/D/I No cellulitis present Dressing/Incision - clean, dry, scant blood-tinged drainage Motor Function - intact, moving foot and toes well on exam.   Past Medical History:  Diagnosis Date  . Cancer ([place])   . Chronic kidney disease   . Colon polyp   . Diabetes mellitus without complication ([place])   . Heart murmur 2008  . Hyperlipidemia   . Hypertension 1980  . Retinal  detachment     Assessment/Plan: 3 Days Post-Op Procedure(s) (LRB): INTRAMEDULLARY (IM) NAIL INTERTROCHANTRIC (Right) Principal Problem:   Closed right hip fracture (HCC) Active Problems:   Hip fracture (HCC)  Estimated body mass index is 20.97 kg/m as calculated from the following:   Height as of this encounter: 5\' 9"  (1.753 m).   Weight as of this encounter: 64.4 kg. Advance diet Up with therapy D/C IV fluids when tolerating po intake.  Labs reviewed this AM. Hg 7.9 this morning.  Continue to work with PT.  Plan for d/c home on Saturday.  DVT Prophylaxis - Foot Pumps, TED hose and heparin Weight-Bearing as tolerated to right leg  Reche Dixon, PA-C [facility] Orthopaedic Surgery 01/23/2019, 6:56 AM

## 2019-01-25 DIAGNOSIS — E1142 Type 2 diabetes mellitus with diabetic polyneuropathy: Secondary | ICD-10-CM

## 2019-01-25 DIAGNOSIS — I10 Essential (primary) hypertension: Secondary | ICD-10-CM

## 2019-01-25 DIAGNOSIS — D631 Anemia in chronic kidney disease: Secondary | ICD-10-CM

## 2019-01-25 DIAGNOSIS — N184 Chronic kidney disease, stage 4 (severe): Secondary | ICD-10-CM

## 2019-01-25 DIAGNOSIS — S7291XA Unspecified fracture of right femur, initial encounter for closed fracture: Secondary | ICD-10-CM

## 2019-01-25 DIAGNOSIS — F0390 Unspecified dementia without behavioral disturbance: Secondary | ICD-10-CM | POA: Diagnosis not present

## 2019-02-09 DIAGNOSIS — S7291XA Unspecified fracture of right femur, initial encounter for closed fracture: Secondary | ICD-10-CM

## 2019-02-09 DIAGNOSIS — N184 Chronic kidney disease, stage 4 (severe): Secondary | ICD-10-CM | POA: Diagnosis not present

## 2019-02-09 DIAGNOSIS — F0390 Unspecified dementia without behavioral disturbance: Secondary | ICD-10-CM | POA: Diagnosis not present

## 2019-02-09 DIAGNOSIS — E1121 Type 2 diabetes mellitus with diabetic nephropathy: Secondary | ICD-10-CM | POA: Diagnosis not present

## 2019-02-09 DIAGNOSIS — I10 Essential (primary) hypertension: Secondary | ICD-10-CM | POA: Diagnosis not present

## 2019-05-17 IMAGING — DX LEFT ELBOW - 2 VIEW
2 series · 2 of 2 positions shown · non-contrast
Comparison: None.

CLINICAL DATA: Left arm/elbow swelling.

EXAM:
LEFT ELBOW - 2 VIEW

[elbow lat]
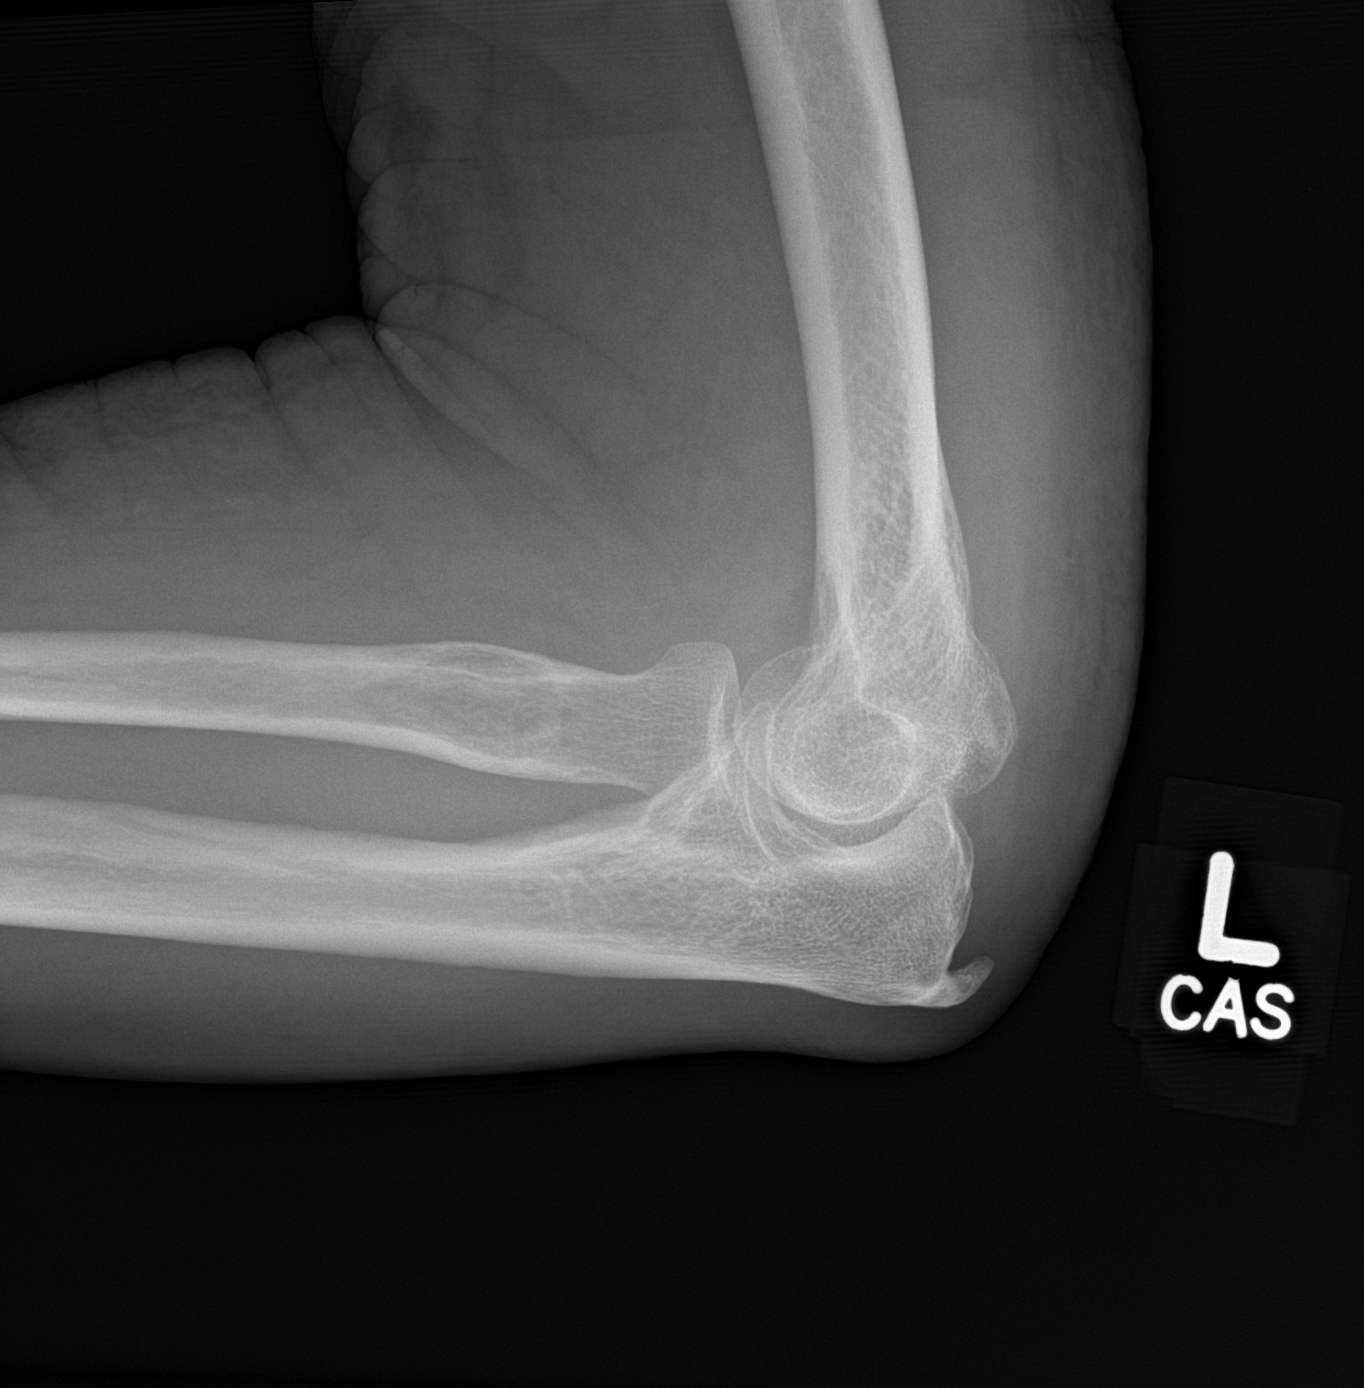

[elbow ap]
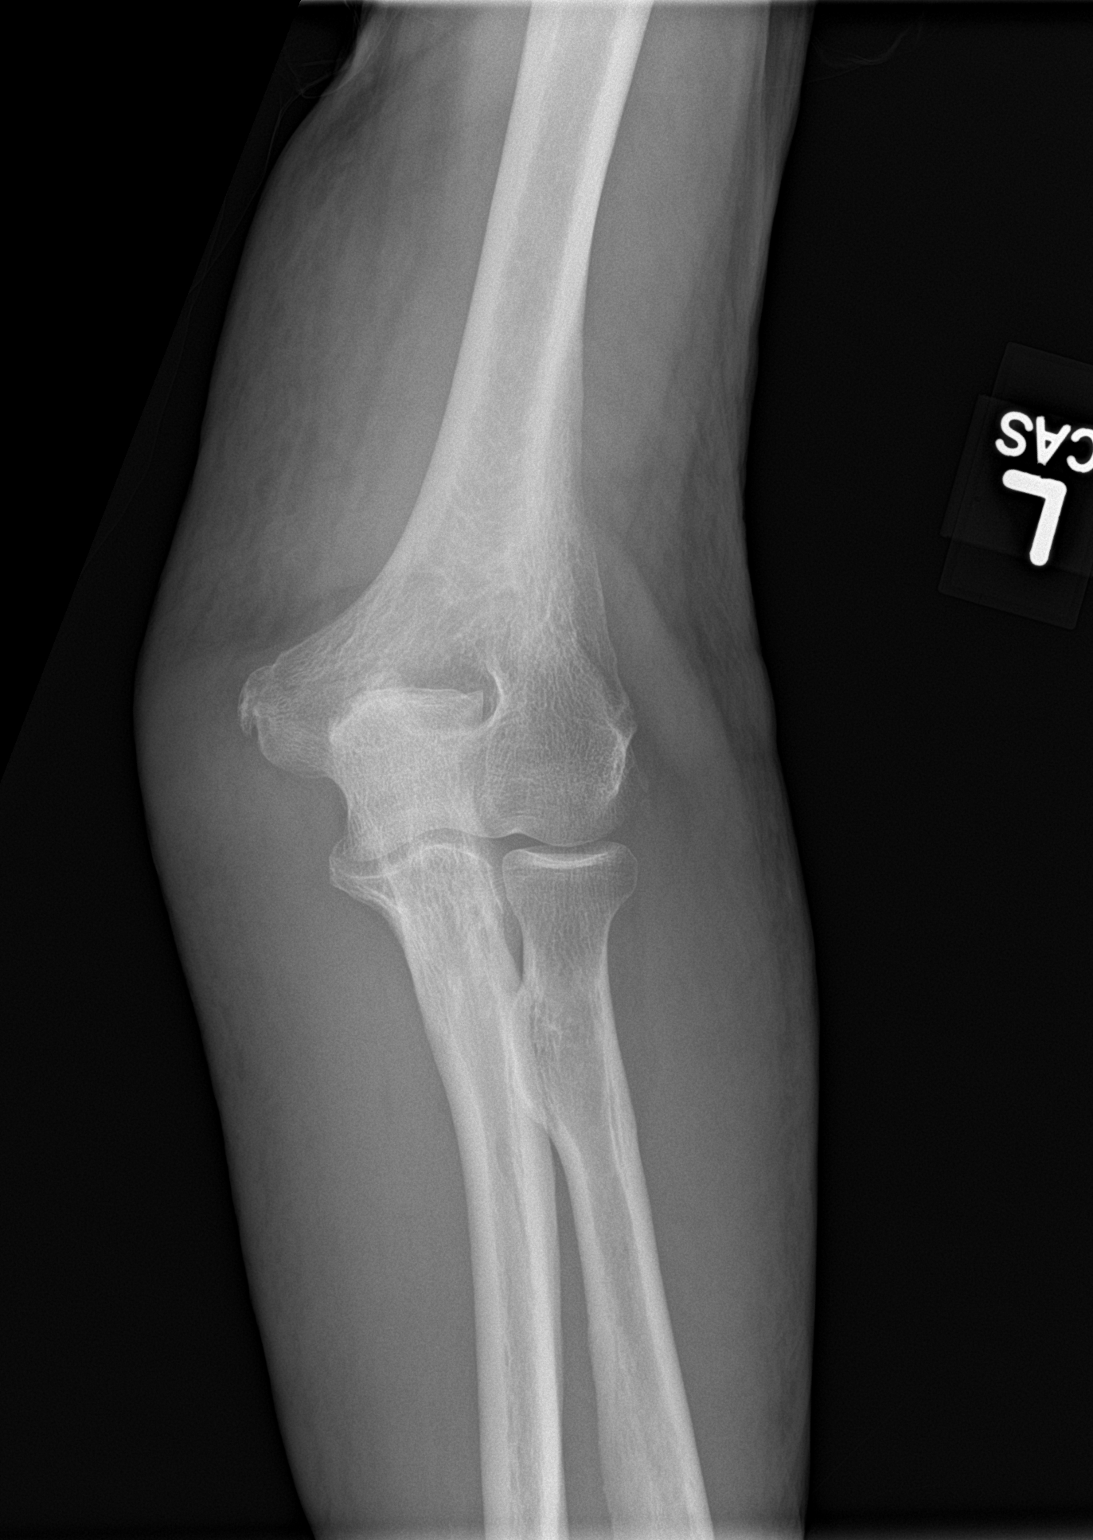

[2 of 2 positions shown; findings below may reference images not displayed]

FINDINGS: There is no evidence of fracture, dislocation, or joint effusion. No
periosteal reaction or bony destructive change. There is a prominent
olecranon spur. Mild enthesopathic changes at the medial and lateral
condyles.

Diffuse soft tissue edema and soft tissue thickening about the
elbow. No soft tissue air or radiopaque foreign body.
IMPRESSION: 1. Diffuse soft tissue edema. No soft tissue air or radiopaque
foreign body.
2. No acute osseous abnormality.  Prominent olecranon spur.

## 2019-06-25 ENCOUNTER — Inpatient Hospital Stay
Admission: EM | Admit: 2019-06-25 | Discharge: 2019-06-30 | DRG: 871 | Disposition: A | Discharge status: Skilled Nursing Facility | Payer: Medicare Other | Source: Skilled Nursing Facility | Attending: Internal Medicine | Admitting: Internal Medicine

## 2019-06-25 ENCOUNTER — Emergency Department: Payer: Medicare Other

## 2019-06-25 DIAGNOSIS — Z7982 Long term (current) use of aspirin: Secondary | ICD-10-CM | POA: Diagnosis not present

## 2019-06-25 DIAGNOSIS — E785 Hyperlipidemia, unspecified: Secondary | ICD-10-CM | POA: Diagnosis present

## 2019-06-25 DIAGNOSIS — N179 Acute kidney failure, unspecified: Secondary | ICD-10-CM | POA: Diagnosis not present

## 2019-06-25 DIAGNOSIS — E871 Hypo-osmolality and hyponatremia: Secondary | ICD-10-CM | POA: Diagnosis present

## 2019-06-25 DIAGNOSIS — E119 Type 2 diabetes mellitus without complications: Secondary | ICD-10-CM

## 2019-06-25 DIAGNOSIS — E1110 Type 2 diabetes mellitus with ketoacidosis without coma: Secondary | ICD-10-CM | POA: Diagnosis not present

## 2019-06-25 DIAGNOSIS — I160 Hypertensive urgency: Secondary | ICD-10-CM | POA: Diagnosis not present

## 2019-06-25 DIAGNOSIS — R0602 Shortness of breath: Secondary | ICD-10-CM | POA: Diagnosis present

## 2019-06-25 DIAGNOSIS — I739 Peripheral vascular disease, unspecified: Secondary | ICD-10-CM | POA: Diagnosis present

## 2019-06-25 DIAGNOSIS — N400 Enlarged prostate without lower urinary tract symptoms: Secondary | ICD-10-CM | POA: Diagnosis present

## 2019-06-25 DIAGNOSIS — Z823 Family history of stroke: Secondary | ICD-10-CM | POA: Diagnosis not present

## 2019-06-25 DIAGNOSIS — F0390 Unspecified dementia without behavioral disturbance: Secondary | ICD-10-CM | POA: Diagnosis present

## 2019-06-25 DIAGNOSIS — Z800 Family history of malignant neoplasm of digestive organs: Secondary | ICD-10-CM | POA: Diagnosis not present

## 2019-06-25 DIAGNOSIS — Z880 Allergy status to penicillin: Secondary | ICD-10-CM

## 2019-06-25 DIAGNOSIS — J9601 Acute respiratory failure with hypoxia: Secondary | ICD-10-CM | POA: Diagnosis present

## 2019-06-25 DIAGNOSIS — Z6824 Body mass index (BMI) 24.0-24.9, adult: Secondary | ICD-10-CM

## 2019-06-25 DIAGNOSIS — I129 Hypertensive chronic kidney disease with stage 1 through stage 4 chronic kidney disease, or unspecified chronic kidney disease: Secondary | ICD-10-CM | POA: Diagnosis present

## 2019-06-25 DIAGNOSIS — R6520 Severe sepsis without septic shock: Secondary | ICD-10-CM | POA: Diagnosis present

## 2019-06-25 DIAGNOSIS — R6510 Systemic inflammatory response syndrome (SIRS) of non-infectious origin without acute organ dysfunction: Secondary | ICD-10-CM

## 2019-06-25 DIAGNOSIS — I872 Venous insufficiency (chronic) (peripheral): Secondary | ICD-10-CM | POA: Diagnosis present

## 2019-06-25 DIAGNOSIS — L89152 Pressure ulcer of sacral region, stage 2: Secondary | ICD-10-CM | POA: Diagnosis present

## 2019-06-25 DIAGNOSIS — D631 Anemia in chronic kidney disease: Secondary | ICD-10-CM | POA: Diagnosis present

## 2019-06-25 DIAGNOSIS — Z888 Allergy status to other drugs, medicaments and biological substances status: Secondary | ICD-10-CM | POA: Diagnosis not present

## 2019-06-25 DIAGNOSIS — E872 Acidosis: Secondary | ICD-10-CM | POA: Diagnosis present

## 2019-06-25 DIAGNOSIS — E8729 Other acidosis: Secondary | ICD-10-CM | POA: Diagnosis present

## 2019-06-25 DIAGNOSIS — Z833 Family history of diabetes mellitus: Secondary | ICD-10-CM

## 2019-06-25 DIAGNOSIS — U071 COVID-19: Secondary | ICD-10-CM

## 2019-06-25 DIAGNOSIS — Z66 Do not resuscitate: Secondary | ICD-10-CM | POA: Diagnosis present

## 2019-06-25 DIAGNOSIS — G9341 Metabolic encephalopathy: Secondary | ICD-10-CM | POA: Diagnosis present

## 2019-06-25 DIAGNOSIS — E440 Moderate protein-calorie malnutrition: Secondary | ICD-10-CM | POA: Diagnosis present

## 2019-06-25 DIAGNOSIS — N184 Chronic kidney disease, stage 4 (severe): Secondary | ICD-10-CM | POA: Diagnosis present

## 2019-06-25 DIAGNOSIS — I10 Essential (primary) hypertension: Secondary | ICD-10-CM | POA: Diagnosis present

## 2019-06-25 DIAGNOSIS — J1282 Pneumonia due to coronavirus disease 2019: Secondary | ICD-10-CM | POA: Diagnosis present

## 2019-06-25 DIAGNOSIS — I161 Hypertensive emergency: Secondary | ICD-10-CM | POA: Diagnosis present

## 2019-06-25 DIAGNOSIS — J159 Unspecified bacterial pneumonia: Secondary | ICD-10-CM | POA: Diagnosis present

## 2019-06-25 DIAGNOSIS — Z7984 Long term (current) use of oral hypoglycemic drugs: Secondary | ICD-10-CM | POA: Diagnosis not present

## 2019-06-25 DIAGNOSIS — R0603 Acute respiratory distress: Secondary | ICD-10-CM | POA: Diagnosis not present

## 2019-06-25 DIAGNOSIS — E1151 Type 2 diabetes mellitus with diabetic peripheral angiopathy without gangrene: Secondary | ICD-10-CM | POA: Diagnosis present

## 2019-06-25 DIAGNOSIS — D509 Iron deficiency anemia, unspecified: Secondary | ICD-10-CM | POA: Diagnosis present

## 2019-06-25 DIAGNOSIS — E1122 Type 2 diabetes mellitus with diabetic chronic kidney disease: Secondary | ICD-10-CM | POA: Diagnosis present

## 2019-06-25 DIAGNOSIS — A419 Sepsis, unspecified organism: Secondary | ICD-10-CM | POA: Diagnosis present

## 2019-06-25 DIAGNOSIS — L8990 Pressure ulcer of unspecified site, unspecified stage: Secondary | ICD-10-CM | POA: Insufficient documentation

## 2019-06-25 LAB — URINALYSIS, COMPLETE (UACMP) WITH MICROSCOPIC
Bilirubin Urine: NEGATIVE
Glucose, UA: >=500 mg/dL — AB
Ketones, ur: NEGATIVE mg/dL
Leukocytes,Ua: NEGATIVE
Nitrite: NEGATIVE
Protein, ur: 30 mg/dL — AB
Specific Gravity, Urine: 1.012 (ref 1.005–1.030)
Squamous Epithelial / HPF: NONE SEEN (ref 0–5)
WBC, UA: NONE SEEN WBC/hpf (ref 0–5)
pH: 5 (ref 5.0–8.0)

## 2019-06-25 LAB — GLUCOSE, CAPILLARY
Glucose-Capillary: >600 mg/dL (ref 70–99)
Glucose-Capillary: 533 mg/dL (ref 70–99)
Glucose-Capillary: >600 mg/dL (ref 70–99)
Glucose-Capillary: 442 mg/dL — ABNORMAL HIGH (ref 70–99)
Glucose-Capillary: 518 mg/dL (ref 70–99)
Glucose-Capillary: 377 mg/dL — ABNORMAL HIGH (ref 70–99)
Glucose-Capillary: 269 mg/dL — ABNORMAL HIGH (ref 70–99)
Glucose-Capillary: 190 mg/dL — ABNORMAL HIGH (ref 70–99)

## 2019-06-25 LAB — CBC WITH DIFFERENTIAL/PLATELET
Abs Immature Granulocytes: 0.29 10*3/uL — ABNORMAL HIGH (ref 0.00–0.07)
Basophils Absolute: 0 10*3/uL (ref 0.0–0.1)
Basophils Relative: 0 %
Eosinophils Absolute: 0 10*3/uL (ref 0.0–0.5)
Eosinophils Relative: 0 %
HCT: 31.8 % — ABNORMAL LOW (ref 39.0–52.0)
Hemoglobin: 10.4 g/dL — ABNORMAL LOW (ref 13.0–17.0)
Immature Granulocytes: 1 %
Lymphocytes Relative: 2 %
Lymphs Abs: 0.5 10*3/uL — ABNORMAL LOW (ref 0.7–4.0)
MCH: 30.8 pg (ref 26.0–34.0)
MCHC: 32.7 g/dL (ref 30.0–36.0)
MCV: 94.1 fL (ref 80.0–100.0)
Monocytes Absolute: 0.6 10*3/uL (ref 0.1–1.0)
Monocytes Relative: 3 %
Neutro Abs: 20 10*3/uL — ABNORMAL HIGH (ref 1.7–7.7)
Neutrophils Relative %: 94 %
Platelets: 261 10*3/uL (ref 150–400)
RBC: 3.38 MIL/uL — ABNORMAL LOW (ref 4.22–5.81)
RDW: 12.2 % (ref 11.5–15.5)
WBC: 21.4 10*3/uL — ABNORMAL HIGH (ref 4.0–10.5)
nRBC: 0 % (ref 0.0–0.2)

## 2019-06-25 LAB — BLOOD GAS, VENOUS
Acid-base deficit: 9.5 mmol/L — ABNORMAL HIGH (ref 0.0–2.0)
Acid-base deficit: NEGATIVE mmol/L (ref 0.0–2.0)
Bicarbonate: 13.9 mmol/L — ABNORMAL LOW (ref 20.0–28.0)
Bicarbonate: 13.5 mmol/L — ABNORMAL LOW (ref 20.0–28.0)
O2 Saturation: 85.2 %
O2 Saturation: 65.1 %
Patient temperature: 37
pCO2, Ven: 24 mmHg — ABNORMAL LOW (ref 44.0–60.0)
pCO2, Ven: 25 mmHg — ABNORMAL LOW (ref 44.0–60.0)
pH, Ven: 7.37 (ref 7.250–7.430)
pH, Ven: 7.34 (ref 7.250–7.430)
pO2, Ven: 52 mmHg — ABNORMAL HIGH (ref 32.0–45.0)
pO2, Ven: 42 mmHg (ref 32.0–45.0)

## 2019-06-25 LAB — COMPREHENSIVE METABOLIC PANEL
ALT: 24 U/L (ref 0–44)
AST: 34 U/L (ref 15–41)
Albumin: 3.3 g/dL — ABNORMAL LOW (ref 3.5–5.0)
Alkaline Phosphatase: 53 U/L (ref 38–126)
Anion gap: 18 — ABNORMAL HIGH (ref 5–15)
BUN: 99 mg/dL — ABNORMAL HIGH (ref 8–23)
CO2: 15 mmol/L — ABNORMAL LOW (ref 22–32)
Calcium: 8.4 mg/dL — ABNORMAL LOW (ref 8.9–10.3)
Chloride: 93 mmol/L — ABNORMAL LOW (ref 98–111)
Creatinine, Ser: 5.33 mg/dL — ABNORMAL HIGH (ref 0.61–1.24)
GFR calc Af Amer: 10 mL/min — ABNORMAL LOW (ref >60)
GFR calc non Af Amer: 9 mL/min — ABNORMAL LOW (ref >60)
Glucose, Bld: 852 mg/dL (ref 70–99)
Potassium: 4.7 mmol/L (ref 3.5–5.1)
Sodium: 126 mmol/L — ABNORMAL LOW (ref 135–145)
Total Bilirubin: 1.1 mg/dL (ref 0.3–1.2)
Total Protein: 6.7 g/dL (ref 6.5–8.1)

## 2019-06-25 LAB — PROCALCITONIN: Procalcitonin: 0.35 ng/mL

## 2019-06-25 LAB — LACTIC ACID, PLASMA
Lactic Acid, Venous: 3.8 mmol/L (ref 0.5–1.9)
Lactic Acid, Venous: 1.8 mmol/L (ref 0.5–1.9)

## 2019-06-25 LAB — BASIC METABOLIC PANEL
Anion gap: 15 (ref 5–15)
BUN: 99 mg/dL — ABNORMAL HIGH (ref 8–23)
CO2: 14 mmol/L — ABNORMAL LOW (ref 22–32)
Calcium: 7.9 mg/dL — ABNORMAL LOW (ref 8.9–10.3)
Chloride: 107 mmol/L (ref 98–111)
Creatinine, Ser: 4.71 mg/dL — ABNORMAL HIGH (ref 0.61–1.24)
GFR calc Af Amer: 12 mL/min — ABNORMAL LOW (ref >60)
GFR calc non Af Amer: 10 mL/min — ABNORMAL LOW (ref >60)
Glucose, Bld: 254 mg/dL — ABNORMAL HIGH (ref 70–99)
Potassium: 3.3 mmol/L — ABNORMAL LOW (ref 3.5–5.1)
Sodium: 136 mmol/L (ref 135–145)

## 2019-06-25 LAB — PROTIME-INR
INR: 1.2 (ref 0.8–1.2)
Prothrombin Time: 15.4 seconds — ABNORMAL HIGH (ref 11.4–15.2)

## 2019-06-25 LAB — APTT: aPTT: 33 seconds (ref 24–36)

## 2019-06-25 LAB — C-REACTIVE PROTEIN: CRP: 10.3 mg/dL — ABNORMAL HIGH (ref <1.0)

## 2019-06-25 LAB — TROPONIN I (HIGH SENSITIVITY): Troponin I (High Sensitivity): 91 ng/L — ABNORMAL HIGH (ref <18)

## 2019-06-25 LAB — BETA-HYDROXYBUTYRIC ACID: Beta-Hydroxybutyric Acid: 0.09 mmol/L (ref 0.05–0.27)

## 2019-06-25 LAB — BRAIN NATRIURETIC PEPTIDE: B Natriuretic Peptide: 203 pg/mL — ABNORMAL HIGH (ref 0.0–100.0)

## 2019-06-25 LAB — LACTATE DEHYDROGENASE: LDH: 296 U/L — ABNORMAL HIGH (ref 98–192)

## 2019-06-25 LAB — FERRITIN: Ferritin: 663 ng/mL — ABNORMAL HIGH (ref 24–336)

## 2019-06-25 LAB — ABO/RH: ABO/RH(D): A POS

## 2019-06-25 MED ORDER — ACETAMINOPHEN 650 MG RE SUPP
650.0000 mg | Freq: Once | RECTAL | Status: DC
  Filled 2019-06-25: qty 1

## 2019-06-25 MED ORDER — SODIUM CHLORIDE 0.9 % IV BOLUS
1000.0000 mL | Freq: Once | INTRAVENOUS | Status: AC
  Administered 2019-06-25: 1000 mL via INTRAVENOUS

## 2019-06-25 MED ORDER — SODIUM CHLORIDE 0.9 % IV SOLN
2.0000 g | Freq: Once | INTRAVENOUS | Status: AC
  Administered 2019-06-25: 2 g via INTRAVENOUS

## 2019-06-25 MED ORDER — IPRATROPIUM-ALBUTEROL 20-100 MCG/ACT IN AERS
1.0000 | INHALATION_SPRAY | Freq: Four times a day (QID) | RESPIRATORY_TRACT | Status: DC
  Administered 2019-06-25 – 2019-06-30 (×19): 1 via RESPIRATORY_TRACT
  Filled 2019-06-25: qty 4

## 2019-06-25 MED ORDER — DEXAMETHASONE SODIUM PHOSPHATE 10 MG/ML IJ SOLN
8.0000 mg | Freq: Once | INTRAMUSCULAR | Status: AC
  Administered 2019-06-25: 14:00:00 8 mg via INTRAVENOUS
  Filled 2019-06-25: qty 1

## 2019-06-25 MED ORDER — SODIUM CHLORIDE 0.9 % IV SOLN: INTRAVENOUS | Status: DC

## 2019-06-25 MED ORDER — POLYETHYLENE GLYCOL 3350 17 G PO PACK: 17.0000 g | PACK | Freq: Every day | ORAL | Status: DC | PRN

## 2019-06-25 MED ORDER — SODIUM CHLORIDE 0.9 % IV SOLN
500.0000 mg | Freq: Every day | INTRAVENOUS | Status: DC
  Administered 2019-06-25 – 2019-06-27 (×3): 500 mg via INTRAVENOUS
  Filled 2019-06-25 (×4): qty 500

## 2019-06-25 MED ORDER — POTASSIUM CHLORIDE 10 MEQ/100ML IV SOLN
10.0000 meq | INTRAVENOUS | Status: AC
  Administered 2019-06-25 – 2019-06-26 (×4): 10 meq via INTRAVENOUS
  Filled 2019-06-25 (×4): qty 100

## 2019-06-25 MED ORDER — VANCOMYCIN HCL IN DEXTROSE 1-5 GM/200ML-% IV SOLN
1000.0000 mg | Freq: Once | INTRAVENOUS | Status: AC
  Administered 2019-06-25: 1000 mg via INTRAVENOUS
  Filled 2019-06-25: qty 200

## 2019-06-25 MED ORDER — SODIUM CHLORIDE 0.9 % IV SOLN
100.0000 mg | Freq: Every day | INTRAVENOUS | Status: AC
  Administered 2019-06-26 – 2019-06-29 (×4): 100 mg via INTRAVENOUS
  Filled 2019-06-25 (×4): qty 100

## 2019-06-25 MED ORDER — ZINC SULFATE 220 (50 ZN) MG PO CAPS
220.0000 mg | ORAL_CAPSULE | Freq: Every day | ORAL | Status: DC
  Administered 2019-06-25 – 2019-06-29 (×4): 220 mg via ORAL
  Filled 2019-06-25 (×5): qty 1

## 2019-06-25 MED ORDER — HEPARIN SODIUM (PORCINE) 5000 UNIT/ML IJ SOLN
5000.0000 [IU] | Freq: Three times a day (TID) | INTRAMUSCULAR | Status: DC
  Administered 2019-06-25 – 2019-06-26 (×2): 5000 [IU] via SUBCUTANEOUS
  Filled 2019-06-25 (×2): qty 1

## 2019-06-25 MED ORDER — INSULIN ASPART 100 UNIT/ML ~~LOC~~ SOLN: 10.0000 [IU] | Freq: Once | SUBCUTANEOUS | Status: DC

## 2019-06-25 MED ORDER — SODIUM CHLORIDE 0.9 % IV SOLN
200.0000 mg | Freq: Once | INTRAVENOUS | Status: AC
  Administered 2019-06-25: 200 mg via INTRAVENOUS
  Filled 2019-06-25: qty 200

## 2019-06-25 MED ORDER — INSULIN REGULAR(HUMAN) IN NACL 100-0.9 UT/100ML-% IV SOLN
INTRAVENOUS | Status: DC
  Administered 2019-06-25: 18:00:00 10 [IU]/h via INTRAVENOUS
  Administered 2019-06-26: 02:00:00 1.6 [IU]/h via INTRAVENOUS
  Filled 2019-06-25 (×3): qty 100

## 2019-06-25 MED ORDER — LACTATED RINGERS IV BOLUS
1000.0000 mL | Freq: Once | INTRAVENOUS | Status: AC
  Administered 2019-06-25: 1000 mL via INTRAVENOUS

## 2019-06-25 MED ORDER — ONDANSETRON HCL 4 MG/2ML IJ SOLN: 4.0000 mg | Freq: Four times a day (QID) | INTRAMUSCULAR | Status: DC | PRN

## 2019-06-25 MED ORDER — DEXAMETHASONE SODIUM PHOSPHATE 10 MG/ML IJ SOLN
6.0000 mg | INTRAMUSCULAR | Status: DC
  Administered 2019-06-25 – 2019-06-29 (×5): 6 mg via INTRAVENOUS
  Filled 2019-06-25 (×5): qty 1

## 2019-06-25 MED ORDER — DEXTROSE-NACL 5-0.45 % IV SOLN: INTRAVENOUS | Status: DC

## 2019-06-25 MED ORDER — HYDRALAZINE HCL 20 MG/ML IJ SOLN: 5.0000 mg | INTRAMUSCULAR | Status: DC | PRN

## 2019-06-25 MED ORDER — ASPIRIN EC 81 MG PO TBEC
81.0000 mg | DELAYED_RELEASE_TABLET | Freq: Every day | ORAL | Status: DC
  Administered 2019-06-25 – 2019-06-29 (×4): 81 mg via ORAL
  Filled 2019-06-25 (×5): qty 1

## 2019-06-25 MED ORDER — ONDANSETRON HCL 4 MG PO TABS: 4.0000 mg | ORAL_TABLET | Freq: Four times a day (QID) | ORAL | Status: DC | PRN

## 2019-06-25 MED ORDER — LABETALOL HCL 5 MG/ML IV SOLN: 10.0000 mg | INTRAVENOUS | Status: DC | PRN

## 2019-06-25 MED ORDER — BISACODYL 10 MG RE SUPP
10.0000 mg | Freq: Every day | RECTAL | Status: DC | PRN
  Filled 2019-06-25: qty 1

## 2019-06-25 MED ORDER — ACETAMINOPHEN 500 MG PO TABS
1000.0000 mg | ORAL_TABLET | Freq: Once | ORAL | Status: AC
  Administered 2019-06-25: 1000 mg via ORAL
  Filled 2019-06-25: qty 2

## 2019-06-25 MED ORDER — SODIUM CHLORIDE 0.9 % IV SOLN
1.0000 g | Freq: Every day | INTRAVENOUS | Status: AC
  Administered 2019-06-26 – 2019-06-30 (×5): 1 g via INTRAVENOUS
  Filled 2019-06-25 (×4): qty 1
  Filled 2019-06-25: qty 10

## 2019-06-25 MED ORDER — METRONIDAZOLE IN NACL 5-0.79 MG/ML-% IV SOLN
500.0000 mg | Freq: Once | INTRAVENOUS | Status: AC
  Administered 2019-06-25: 500 mg via INTRAVENOUS
  Filled 2019-06-25: qty 100

## 2019-06-25 MED ORDER — DEXTROSE 50 % IV SOLN: 0.0000 mL | INTRAVENOUS | Status: DC | PRN

## 2019-06-25 MED ORDER — ASCORBIC ACID 500 MG PO TABS
500.0000 mg | ORAL_TABLET | Freq: Every day | ORAL | Status: DC
  Administered 2019-06-25 – 2019-06-29 (×4): 500 mg via ORAL
  Filled 2019-06-25 (×5): qty 1

## 2019-06-25 MED ORDER — MAGNESIUM CITRATE PO SOLN
1.0000 | Freq: Once | ORAL | Status: DC | PRN
  Filled 2019-06-25: qty 296

## 2019-06-25 MED ORDER — HYDROCOD POLST-CPM POLST ER 10-8 MG/5ML PO SUER
5.0000 mL | Freq: Two times a day (BID) | ORAL | Status: DC | PRN
  Filled 2019-06-25: qty 5

## 2019-06-25 MED ORDER — POTASSIUM CHLORIDE 10 MEQ/100ML IV SOLN
10.0000 meq | INTRAVENOUS | Status: AC
  Filled 2019-06-25 (×2): qty 100

## 2019-06-25 MED ORDER — GUAIFENESIN-DM 100-10 MG/5ML PO SYRP
10.0000 mL | ORAL_SOLUTION | ORAL | Status: DC | PRN
  Filled 2019-06-25: qty 10

## 2019-06-25 MED ORDER — ACETAMINOPHEN 325 MG PO TABS: 650.0000 mg | ORAL_TABLET | Freq: Four times a day (QID) | ORAL | Status: DC | PRN

## 2019-06-25 NOTE — ED Provider Notes (Addendum)
[facility] Emergency Department Provider Note  ____________________________________________   First MD Initiated Contact with Patient 06/25/19 1401     (approximate)  I have reviewed the triage vital signs and the nursing notes.  History  Chief Complaint Shortness of Breath    HPI [name] is a 86 y.o. male resides at [facility] with hx of HTN, PAD, CKD, DM, diagnosed with COVID on 1/6, had been doing well aside from some fatigue.  Yesterday daughter talked to him and seemed to be doing well.   Today he had sudden, seemingly acute worsening of his breathing. Daughter was at lunch with him (visiting through the window) when they checked is oxygen and noted it to be 85%, prompting them to call EMS.  With EMS, his oxygen was in the 70s on room air.  He had associated fever and tachycardia with EMS.  Obvious tachypnea and increased work of breathing as well as audible wheezing.  He does complain of shortness of breath and notes some loose stool over the last several days.  Otherwise, history is limited due to his severe respiratory distress.  Per daughter, clarifies that he is DNR/DNI. She is patient's POA ([place], [phone]). He is okay with BiPAP and HFNC. He would be okay with dialysis only if reversible. Does not want to be permanently on dialysis.   Daughter presents with paperwork confirming his COVID positive PCR testing.  Caveat: history primarily obtained from daughter and EMS due to patient's clinical status/severe respiratory distress.   Past Medical Hx Past Medical History:  Diagnosis Date  . Cancer ([place])   . Chronic kidney disease   . Colon polyp   . Diabetes mellitus without complication ([place])   . Heart murmur 2008  . Hyperlipidemia   . Hypertension 1980  . Retinal detachment     Problem List Patient Active Problem List   Diagnosis Date Noted  . Closed right hip fracture ([place]) 01/19/2019  . Hip fracture ([place]) 01/19/2019   . Left arm cellulitis 09/05/2018  . Lymphedema 07/27/2018  . Chronic venous insufficiency 07/27/2018  . Ulcers of both lower legs, limited to breakdown of skin ([place]) 06/29/2018  . Peripheral arterial disease ([place]) 05/29/2018  . DM2 (diabetes mellitus, type 2) ([place]) 04/04/2018  . HTN (hypertension) 04/04/2018  . History of colonic polyps 07/14/2015    Past Surgical Hx Past Surgical History:  Procedure Laterality Date  . COLONOSCOPY  2011,06/23/13   Dr Bary Castilla  . COLONOSCOPY  06-28-2011  . COLONOSCOPY WITH PROPOFOL N/A 08/30/2015   Procedure: COLONOSCOPY WITH PROPOFOL;  Surgeon: [name], MD;  Location: [facility] ENDOSCOPY;  Service: Endoscopy;  Laterality: N/A;  . EYE SURGERY    . HERNIA REPAIR    . INTRAMEDULLARY (IM) NAIL INTERTROCHANTERIC Right 01/20/2019   Procedure: INTRAMEDULLARY (IM) NAIL INTERTROCHANTRIC;  Surgeon: Leim Fabry, MD;  Location: [facility] ORS;  Service: Orthopedics;  Laterality: Right;  . LOWER EXTREMITY ANGIOGRAPHY Right 04/20/2018   Procedure: Lower Extremity Angiography;  Surgeon: Algernon Huxley, MD;  Location: [place] CV LAB;  Service: Cardiovascular;  Laterality: Right;  . POLYPECTOMY  06-23-13  . ]      Medications Prior to Admission medications   Medication Sig Start Date End Date Taking? Authorizing Provider  acetaminophen (TYLENOL) 325 MG tablet Take 2 tablets (650 mg total) by mouth every 6 (six) hours as needed for mild pain. 01/23/19 01/23/20  Loletha Grayer, MD  amLODipine (NORVASC) 10 MG tablet Take 10 mg by mouth  daily.  05/28/13   [provider]  aspirin EC 81 MG EC tablet Take 1 tablet (81 mg total) by mouth daily. 04/22/18   Max Sane, MD  B-Complex TABS Take 1 tablet by mouth daily.    [provider]  calcitRIOL (ROCALTROL) 0.25 MCG capsule Take 0.25 mcg by mouth daily.  07/13/18   [provider]  Ferrous Sulfate (IRON) 28 MG TABS Take 1 tablet by mouth.    [provider]  furosemide (LASIX) 20 MG  tablet Take 10-40 mg by mouth 2 (two) times daily. Take 40 mg by mouth in the morning, then 10 mg by mouth at lunch    [provider]  heparin 5000 UNIT/ML injection Heparin 5000 units subcutaneous injection three times a day for fourteen days then discontinue 01/23/19   Loletha Grayer, MD  JALYN 0.5-0.4 MG CAPS Take 1 capsule by mouth daily.     [provider]  linagliptin (TRADJENTA) 5 MG TABS tablet Take 5 mg by mouth daily.    [provider]  losartan (COZAAR) 25 MG tablet Take 0.5 tablets (12.5 mg total) by mouth 2 (two) times daily. 01/23/19   Loletha Grayer, MD  Multiple Vitamin (MULTIVITAMIN) capsule Take 1 capsule by mouth daily.    [provider]  sodium bicarbonate 650 MG tablet Take 650 mg by mouth 2 (two) times daily.    [provider]  traMADol (ULTRAM) 50 MG tablet Take 1 tablet (50 mg total) by mouth every 12 (twelve) hours as needed for moderate pain or severe pain. 01/23/19   Loletha Grayer, MD    Allergies Meperidine and Penicillins  Family Hx Family History  Problem Relation Age of Onset  . Colon cancer Mother   . Diabetes Father   . Diabetes Brother   . Stroke Brother   . Diabetes Son     Social Hx Social History   Tobacco Use  . Smoking status: Never Smoker  . Smokeless tobacco: Never Used  Substance Use Topics  . Alcohol use: No  . Drug use: No     Review of Systems  Constitutional: Negative for fever, chills. Eyes: Negative for visual changes. ENT: Negative for sore throat. Cardiovascular: Negative for chest pain. Respiratory: + for shortness of breath. Gastrointestinal: Negative for nausea, vomiting. + loose stool Genitourinary: Negative for dysuria. Musculoskeletal: Negative for leg swelling. Skin: Negative for rash. Neurological: Negative for for headaches.   Physical Exam  Vital Signs: ED Triage Vitals  Enc Vitals Group     BP 06/25/19 1423 (!) 195/54     Pulse Rate 06/25/19 1423  (!) 120     Resp --      Temp 06/25/19 1423 98.8 F (37.1 C)     Temp Source 06/25/19 1423 Oral     SpO2 06/25/19 1408 99 %     Weight 06/25/19 1425 141 lb 15.6 oz (64.4 kg)     Height 06/25/19 1425 5\' 11"  (1.803 m)     Head Circumference --      Peak Flow --      Pain Score --      Pain Loc --      Pain Edu? --      Excl. in [place]? --      Constitutional: Awake. In obvious respiratory distress. Head: Normocephalic. Atraumatic. Eyes: Conjunctivae clear. Sclera anicteric. Nose: No congestion. No rhinorrhea. Mouth/Throat: Wearing mask. MM dry. Neck: No stridor.   Cardiovascular: Tachycardic. Extremities well perfused. Respiratory: Tachypneic.  Diffuse expiratory wheezing and coarse lung sounds.  Accessory muscle use, abdominal breathing.  On HFNC, FiO2 99%, 50 L flow. Gastrointestinal: Soft. Non-tender. Non-distended.  Musculoskeletal: No lower extremity edema. No deformities. Neurologic:   No gross focal neurologic deficits are appreciated.  Skin: Skin is warm, dry and intact. No rash noted. Psychiatric: Mood and affect are appropriate for situation.    Radiology  CXR: IMPRESSION:  Patchy bilateral mid and lower lung zone airspace opacities  consistent with multifocal COVID-19 pneumonia.    Procedures  Procedure(s) performed (including critical care):  .Critical Care Performed by: Lilia Pro., MD Authorized by: Lilia Pro., MD   Critical care provider statement:    Critical care time (minutes):  60   Critical care was time spent personally by me on the following activities:  Discussions with consultants, evaluation of patient's response to treatment, examination of patient, ordering and performing treatments and interventions, ordering and review of laboratory studies, ordering and review of radiographic studies, pulse oximetry, re-evaluation of patient's condition, obtaining history from patient or surrogate and review of old charts     Initial Impression /  Assessment and Plan / ED Course  86 y.o. male who presents to the ED known COVID positive who presents for respiratory distress, hypoxia. Febrile and tachycardic on arrival.   Concern for severe acute respiratory distress secondary to COVID.  Also concern for concomitant sepsis based on the severity of his illness and his presenting vital signs.  Will initiate broad work-up, including inflammatory markers, cultures, empiric antibiotics, steroids, antivirals.  Spoke with the daughter via phone, healthcare power of attorney, about severity of patient's illness.  Clarified his goals of care, as noted above.  We will proceed with high flow nasal cannula, antibiotics, antivirals, and admission.  Work-up thus far reveals leukocytosis to 21.4.  Lactic acid 3.8.  Receiving broad-spectrum antibiotics, steroids, remdesivir ordered.  Remains on HFNC.  With regards to his lactic acid, will give IV fluids - will start with 1 L for now and reassess, will attempt to balance sepsis fluid resuscitation with concern for likely concomitant ARDS 2/2 COVID.  Awaiting remainder of labs.  Care transferred to oncoming MD due to shift change.  Discussed with hospitalist for admission.   Final Clinical Impression(s) / ED Diagnosis  Final diagnoses:  COVID-19  SIRS (systemic inflammatory response syndrome) ([place])  Respiratory distress       Note:  This document was prepared using Dragon voice recognition software and may include unintentional dictation errors.     Lilia Pro., MD 06/25/19 [phone]

## 2019-06-25 NOTE — ED Triage Notes (Signed)
Pt arrived via [facility] from [facility] via [facility] w/ [facility]

## 2019-06-25 NOTE — ED Notes (Signed)
No insulin adjustment per endotool

## 2019-06-25 NOTE — ED Notes (Signed)
Date and time results received: 06/25/19 1600 (use smartphrase ".now" to insert current time)  Test: Lactic Acid Critical Value: 3.8  Name of Provider Notified: Dr Joan Mayans   Orders Received? Or Actions Taken?: No new orders at this time.

## 2019-06-25 NOTE — ED Notes (Signed)
Charna Archer, MD notified of BP. Per MD, will continue with current rate of maintenance fluids and watch for MAP >65

## 2019-06-25 NOTE — Consult Note (Signed)
CODE SEPSIS - PHARMACY COMMUNICATION  **Broad Spectrum Antibiotics should be administered within 1 hour of Sepsis diagnosis**  Time Code Sepsis Called/Page Received: 1406  Antibiotics Ordered: cefepime and vancomycin   Time of 1st antibiotic administration: [place] ,PharmD, BCPS Clinical Pharmacist  06/25/2019  2:24 PM

## 2019-06-25 NOTE — ED Notes (Signed)
Pt sleeping, call bell at right side.

## 2019-06-25 NOTE — H&P (Signed)
History and Physical    [name]:[id number] DOB: 1931-12-08 DOA: 06/25/2019  PCP: [name], MD   Patient coming from: [facility]  I have personally briefly reviewed patient's old medical records in [place]  Chief Complaint: shortness of breath, fatigue  HPI: [name] is a 86 y.o. male with medical history significant of type 2 diabetes, hypertension, peripheral artery disease, mild dementia, CKD stage IV presented to the ED today from [facility] assisted living facility with sudden onset shortness of breath witnessed by his daughter.  Patient is a poor historian secondary to dementia and acute illness, history is obtained from daughter by phone, ED physician and chart review.  Daughter reported that she was having lunch with patient today, visiting through the window, and she noticed he suddenly appeared to be very short of breath.  Facility staff checked O2 sat and it was 85% on room air, EMS was called.  Upon their arrival, O2 sat was in the 70s on room air, and found to be febrile and tachycardic with increased work of breathing and audible wheezing.  Daughter reported that patient seemed to be doing well when she spoke with him yesterday.  Tested positive for COVID-19 on 06/14/19, had been doing well other than fatigue and mild weakness.  Last night developed a cough.  Otherwise he has been eating very well, saying he feels fine.  She says he has no problems with swallowing at all.  She also mentioned family think he may have Parkinson's, has tremor in his hands that gets worse when he is tired or sick, she wanted [place] to be aware.  During my encounter with patient in the ED, he was unable to tell me how long he has been feeling sick or what symptoms he has been having.   ED Course: Febrile 101.7 F, tachycardic 120, tachypneic up to 30, hypertensive in the 190s/70s, significantly hypoxic requiring 50 L/min HFNC.  Labs notable for lactic acid 3.8, sodium 126, chloride 93,  CO2 15, glucose 852, BUN 99, creatinine 5.33, anion gap 18, BNP 203, LDH 296, troponin 91, ferritin 663, leukocytosis 21.4k (neutrophil predominant), mild anemia hemoglobin 10.4.  Chest x-ray showed patchy bilateral mid and lower lung zone airspace opacities consistent with multifocal COVID-19 pneumonia.  Patient was treated with broad-spectrum antibiotics, remdesivir and dexamethasone, and LR bolus.  Admitted to stepdown unit under hospitalist service.  Code Status: DNR/DNI - okay with BiPAP, HFNC, and dialysis (if temporary) - confirmed by phone with daughter and Chauncey Reading, [name].    Review of Systems: As per HPI otherwise 10 point review of systems negative.    Past Medical History:  Diagnosis Date  . Cancer ([place])   . Chronic kidney disease   . Colon polyp   . Diabetes mellitus without complication ([place])   . Heart murmur 2008  . Hyperlipidemia   . Hypertension 1980  . Retinal detachment     Past Surgical History:  Procedure Laterality Date  . COLONOSCOPY  2011,06/23/13   Dr Bary Castilla  . COLONOSCOPY  06-28-2011  . COLONOSCOPY WITH PROPOFOL N/A 08/30/2015   Procedure: COLONOSCOPY WITH PROPOFOL;  Surgeon: [name], MD;  Location: [facility] ENDOSCOPY;  Service: Endoscopy;  Laterality: N/A;  . EYE SURGERY    . HERNIA REPAIR    . INTRAMEDULLARY (IM) NAIL INTERTROCHANTERIC Right 01/20/2019   Procedure: INTRAMEDULLARY (IM) NAIL INTERTROCHANTRIC;  Surgeon: Leim Fabry, MD;  Location: [facility] ORS;  Service: Orthopedics;  Laterality: Right;  . LOWER  EXTREMITY ANGIOGRAPHY Right 04/20/2018   Procedure: Lower Extremity Angiography;  Surgeon: Algernon Huxley, MD;  Location: [facility];  Service: Cardiovascular;  Laterality: Right;  . POLYPECTOMY  06-23-13  . ]       reports that he has never smoked. He has never used smokeless tobacco. He reports that he does not drink alcohol or use drugs.  Allergies  Allergen Reactions  . Meperidine Other (See Comments)    Severe bradycardia Severe  bradycardia   . Penicillins Rash    Family History  Problem Relation Age of Onset  . Colon cancer Mother   . Diabetes Father   . Diabetes Brother   . Stroke Brother   . Diabetes Son      Prior to Admission medications   Medication Sig Start Date End Date Taking? Authorizing Provider  acetaminophen (TYLENOL) 500 MG tablet Take 500 mg by mouth every 12 (twelve) hours as needed for mild pain.   Yes [provider]  amLODipine (NORVASC) 10 MG tablet Take 10 mg by mouth daily.  05/28/13  Yes [provider]  ascorbic acid (VITAMIN C) 500 MG tablet Take 500 mg by mouth 2 (two) times daily. 06/23/19 07/07/19 Yes [provider]  aspirin EC 81 MG EC tablet Take 1 tablet (81 mg total) by mouth daily. 04/22/18  Yes Max Sane, MD  B-Complex TABS Take 1 tablet by mouth daily.   Yes [provider]  calcitRIOL (ROCALTROL) 0.25 MCG capsule Take 0.25 mcg by mouth daily.  07/13/18  Yes [provider]  calcium carbonate (OSCAL) 1500 (600 Ca) MG TABS tablet Take 600 mg of elemental calcium by mouth 2 (two) times daily.   Yes [provider]  Cholecalciferol (VITAMIN D) 50 MCG (2000 UT) tablet Take 2,000 Units by mouth daily.   Yes [provider]  denosumab (PROLIA) 60 MG/ML SOSY injection Inject 60 mg into the skin every 6 (six) months.   Yes [provider]  ferrous sulfate 325 (65 FE) MG tablet Take 325 mg by mouth daily with breakfast.   Yes [provider]  furosemide (LASIX) 20 MG tablet Take 10-40 mg by mouth 2 (two) times daily. Take 2 tablets (40mg ) by mouth every morning and take  tablet (10mg ) by mouth at lunchtime   Yes [provider]  JALYN 0.5-0.4 MG CAPS Take 1 capsule by mouth daily.    Yes [provider]  linagliptin (TRADJENTA) 5 MG TABS tablet Take 5 mg by mouth daily.   Yes [provider]  losartan (COZAAR) 25 MG tablet Take 0.5 tablets (12.5 mg total) by mouth 2 (two) times  daily. 01/23/19  Yes Wieting, Richard, MD  Multiple Vitamin (MULTIVITAMIN) capsule Take 1 capsule by mouth daily.   Yes [provider]  potassium chloride (KLOR-CON) 10 MEQ tablet Take 10 mEq by mouth daily.   Yes [provider]  sodium bicarbonate 650 MG tablet Take 650 mg by mouth 2 (two) times daily.   Yes [provider]  triamcinolone ointment (KENALOG) 0.1 % Apply 1 application topically 2 (two) times daily. (apply to arms and legs)   Yes [provider]  zinc gluconate 50 MG tablet Take 100 mg by mouth daily. 06/24/19 07/07/19 Yes [provider]    Physical Exam: Vitals:   06/25/19 1500 06/25/19 1656 06/25/19 1700 06/25/19 1730  BP: (!) 197/73 (!) 127/57 (!) 120/58 (!) 132/59  Pulse: (!) 116 78 79 81  Resp: (!) 30 (!)  23 (!) 22 (!) 27  Temp:      TempSrc:      SpO2: 95% 98% 95% (!) 88%  Weight:      Height:        Constitutional: sleeping but easily arouses to voice, ill-appearing, NAD Eyes:  lids and conjunctivae normal, EOMI ENMT: Mucous membranes are dry. Hearing grossly normal.  Respiratory: rhonchi bilaterally left > right, no wheezing or crackles appreciated. Normal respiratory effort. No accessory muscle use.  Cardiovascular: Regular rate and rhythm, no murmurs / rubs / gallops. No extremity edema.  Abdomen: Soft, nontender, no distention.  Musculoskeletal: no clubbing / cyanosis. No joint deformity upper and lower extremities.  Moves all extremities. Skin: Dry, intact, warm to touch Neurologic: CN 2-12 grossly intact.  No gross focal motor deficits, normal speech, alert and oriented Psychiatric: Alert and oriented x 3. Normal mood, congruent affect.  No apparent hallucinations.   Labs on Admission: I have personally reviewed following labs and imaging studies  CBC: Recent Labs  Lab 06/25/19 1530  WBC 21.4*  NEUTROABS 20.0*  HGB 10.4*  HCT 31.8*  MCV 94.1  PLT 388   Basic Metabolic Panel: Recent Labs  Lab  06/25/19 1530  NA 126*  K 4.7  CL 93*  CO2 15*  GLUCOSE 852*  BUN 99*  CREATININE 5.33*  CALCIUM 8.4*   GFR: Estimated Creatinine Clearance: 9.2 mL/min (A) (by C-G formula based on SCr of 5.33 mg/dL (H)). Liver Function Tests: Recent Labs  Lab 06/25/19 1530  AST 34  ALT 24  ALKPHOS 53  BILITOT 1.1  PROT 6.7  ALBUMIN 3.3*   No results for input(s): LIPASE, AMYLASE in the last 168 hours. No results for input(s): AMMONIA in the last 168 hours. Coagulation Profile: Recent Labs  Lab 06/25/19 1530  INR 1.2   Cardiac Enzymes: No results for input(s): CKTOTAL, CKMB, CKMBINDEX, TROPONINI in the last 168 hours. BNP (last 3 results) No results for input(s): PROBNP in the last 8760 hours. HbA1C: No results for input(s): HGBA1C in the last 72 hours. CBG: Recent Labs  Lab 06/25/19 1747  GLUCAP >600*   Lipid Profile: No results for input(s): CHOL, HDL, LDLCALC, TRIG, CHOLHDL, LDLDIRECT in the last 72 hours. Thyroid Function Tests: No results for input(s): TSH, T4TOTAL, FREET4, T3FREE, THYROIDAB in the last 72 hours. Anemia Panel: Recent Labs    06/25/19 1530  FERRITIN 663*   Urine analysis:    Component Value Date/Time   COLORURINE STRAW (A) 01/19/2019 1753   APPEARANCEUR CLEAR (A) 01/19/2019 1753   LABSPEC 1.008 01/19/2019 1753   PHURINE 6.0 01/19/2019 1753   GLUCOSEU 50 (A) 01/19/2019 1753   HGBUR NEGATIVE 01/19/2019 1753   BILIRUBINUR NEGATIVE 01/19/2019 1753   KETONESUR NEGATIVE 01/19/2019 1753   PROTEINUR 30 (A) 01/19/2019 1753   NITRITE NEGATIVE 01/19/2019 1753   LEUKOCYTESUR NEGATIVE 01/19/2019 1753    Radiological Exams on Admission: DG Chest Port 1 View  Result Date: 06/25/2019 CLINICAL DATA:  Pt arrived via [facility] from [facility] via [facility] w/ SOB EXAM: PORTABLE CHEST 1 VIEW COMPARISON:  01/19/2019 FINDINGS: Patchy bilateral airspace lung opacities are noted in the mid and lower lungs. No evidence of pulmonary edema. No pleural effusion  or pneumothorax. Cardiac silhouette is normal in size. No mediastinal or hilar masses. Skeletal structures are grossly intact. IMPRESSION: Patchy bilateral mid and lower lung zone airspace opacities consistent with multifocal COVID-19 pneumonia. Electronically Signed   By: Lajean Manes M.D.   On:  06/25/2019 14:49    EKG: None  Assessment/Plan Principal Problem:   Acute hypoxemic respiratory failure due to COVID-19 [facility]) Active Problems:   DKA (diabetic ketoacidoses) (HCC)   Hyponatremia   High anion gap metabolic acidosis   AKI (acute kidney injury) ([place])   Hypertensive urgency   CKD (chronic kidney disease), stage IV (HCC)   Dementia without behavioral disturbance (HCC)   Iron deficiency anemia   DM2 (diabetes mellitus, type 2) (HCC)   HTN (hypertension)   Peripheral arterial disease (HCC)   Chronic venous insufficiency   Severe sepsis secondary to below.  Present on admission.  As evidenced by fever, leukocytosis, tachycardia, tachypnea, AKI superimposed on CKD stage IV, DKA, hypertensive urgency and imaging consistent with pneumonia.  Initial lactic acid 3.8, repeat normal at 1.8.  No signs of impending septic shock at this time.  Plan as below. Acute hypoxemic respiratory failure due to COVID-19 [facility]) -initially on 50 L/min HFNC.  During admission encounter, prongs were out of patient's nose and on his chin, O2 sat was 95 to 96%, so likely can wean down oxygen. Multifocal Pneumonia due to COVID-19 virus  Possible superimposed community-acquired pneumonia -has neutrophil predominant leukocytosis, procalcitonin pending.  Presented with progressive shortness of breath, hypoxia, cough, with chest x-ray showing bilateral multifocal pneumonia and had tested positive for COVID-19 at facility, tested on 06/14/2019. --Date of Dx: 06/14/2019 --Oxygen requirements:  50 L/min HFNC on admission --Antibiotics: Procalcitonin 0.35, Rocephin and azithromycin --Diuretics: No history of CHF, clinically  euvolemic --maintain euvolemia to net-neg fluid balance --Vitamin C and Zinc: Per protocol --Remdesivir: Started on  1/15 --Steroids: Started on  1/15 --Actemra:   will discuss this with patient's daughter if oxygen requirement remains high or worsens --maintain airborne and contact precautions --supplemental oxygen, maintain o2 sat > 90% --Combivent inhaler every 6 hours --Tussionex and Robitussin --encourage patient to maintain awake prone positive for 16+hours a day as possible, minimum 2-3 hours at night --VQ scan pending to evaluate for PE  DKA (diabetic ketoacidoses) -initial glucose 852, anion gap 18.  Received fluid bolus in the ED.  He does not seem terribly dry at this time.  No known history of CHF but will be cautious with fluids. --Admit to stepdown unit --Insulin drip --BMPs every 4 hours --Glucose monitoring by Endo tool --Continue hydration with normal saline at 100 cc/hr --Otherwise management per DKA protocol   Hyponatremia -sodium 126 on admission when corrected for hyperglycemia is actually 138. --No further work-up for now  High anion gap metabolic acidosis -secondary to DKA.  Expect resolution with treatment as above.  AKI (acute kidney injury) superimposed on CKD stage IV Baseline GFR 18, per daughter.  Baseline creatinine around 3.  On admission, creatinine 5.33.   Likely prerenal azotemia.  Expect improvement with IV hydration as above.  If not improving or gets worse, will do further evaluation and consult nephrology.  Hypertensive urgency - present on admission, systolic in 527P Essential hypertension  Home regimen is losartan, Lasix (for edema), and amlodipine --Hold losartan and Lasix for now given AKI --Continue amlodipine --IV hydralazine and/or labetalol PRN  Dementia without behavioral disturbance  Patient's daughter reports family think he may have Parkinson's disease.  Is being worked up for this.  Has tremor in his hands that she states gets  worse when he is not feeling well and often in the mornings.  Not on medications. --Monitor  Iron deficiency anemia -hold iron supplement for now.  Hemoglobin 10.4 on admission, appears near  his baseline.  No evidence of any active bleeding. --Daily CBC to monitor  DM2 (diabetes mellitus, type 2) -  A1c 7.1 on 01/19/2019.  Takes Tradjenta at home, held.  Currently in DKA on insulin drip.   --Transition to subcutaneous sensitive sliding scale NovoLog once off the drip  Peripheral arterial disease  --Continue aspirin   DVT prophylaxis: Heparin  Code Status: DNR/DNI - okay with BiPAP, HFNC, and dialysis if temporary Family Communication: Spoke with [name], patient's daughter and healthcare power of attorney to obtain history, confirmed CODE STATUS, and discussed the plan.  All questions answered. Disposition Plan: Expect discharge back to [facility] pending clinical improvement and PT evaluation Consults called: None  Admission status: Inpatient   Severity of Illness: The appropriate patient status for this patient is INPATIENT. Inpatient status is judged to be reasonable and necessary in order to provide the required intensity of service to ensure the patient's safety. The patient's presenting symptoms, physical exam findings, and initial radiographic and laboratory data in the context of their chronic comorbidities is felt to place them at high risk for further clinical deterioration. Furthermore, it is not anticipated that the patient will be medically stable for discharge from the hospital within 2 midnights of admission. The following factors support the patient status of inpatient.    "           The patient's presenting symptoms include  shortness of breath and cough. "           The worrisome physical exam findings include increased work of breathing, diffuse rhonchi, hypoxia. "           The initial radiographic and laboratory data are worrisome because of  multifocal pneumonia, DKA,  acute renal failure. "           The chronic co-morbidities include  hypertension, type 2 diabetes, peripheral artery disease, mild dementia, CKD stage IV, mild dementia    * I certify that at the point of admission it is my clinical judgment that the patient will require inpatient hospital care spanning beyond 2 midnights from the point of admission due to high intensity of service, high risk for further deterioration and high frequency of surveillance required.*    Ezekiel Slocumb, DO [facility]   If 7PM-7AM, please contact night-coverage [email or web address] Password [facility]  06/25/2019, 6:02 PM

## 2019-06-25 NOTE — ED Notes (Signed)
Date and time results received: 06/25/19 1640 (use smartphrase ".now" to insert current time)  Test: Glucose  Critical Value: 852  Name of Provider Notified: Dr Joan Mayans   Orders Received? Or Actions Taken?: No new orders at this time.

## 2019-06-25 NOTE — ED Notes (Signed)
Verified endotool with April, RN

## 2019-06-25 NOTE — ED Notes (Signed)
This RN adjusted insulin, verified with April, RN

## 2019-06-26 ENCOUNTER — Inpatient Hospital Stay
Admission: AD | Admit: 2019-06-26 | Payer: PRIVATE HEALTH INSURANCE | Source: Other Acute Inpatient Hospital | Admitting: Family Medicine

## 2019-06-26 LAB — GLUCOSE, CAPILLARY
Glucose-Capillary: 118 mg/dL — ABNORMAL HIGH (ref 70–99)
Glucose-Capillary: 118 mg/dL — ABNORMAL HIGH (ref 70–99)
Glucose-Capillary: 140 mg/dL — ABNORMAL HIGH (ref 70–99)
Glucose-Capillary: 174 mg/dL — ABNORMAL HIGH (ref 70–99)
Glucose-Capillary: 185 mg/dL — ABNORMAL HIGH (ref 70–99)
Glucose-Capillary: 246 mg/dL — ABNORMAL HIGH (ref 70–99)
Glucose-Capillary: 293 mg/dL — ABNORMAL HIGH (ref 70–99)
Glucose-Capillary: 323 mg/dL — ABNORMAL HIGH (ref 70–99)

## 2019-06-26 LAB — COMPREHENSIVE METABOLIC PANEL
ALT: 27 U/L (ref 0–44)
AST: 46 U/L — ABNORMAL HIGH (ref 15–41)
Albumin: 2.8 g/dL — ABNORMAL LOW (ref 3.5–5.0)
Alkaline Phosphatase: 43 U/L (ref 38–126)
Anion gap: 13 (ref 5–15)
BUN: 95 mg/dL — ABNORMAL HIGH (ref 8–23)
CO2: 14 mmol/L — ABNORMAL LOW (ref 22–32)
Calcium: 7.9 mg/dL — ABNORMAL LOW (ref 8.9–10.3)
Chloride: 106 mmol/L (ref 98–111)
Creatinine, Ser: 4.82 mg/dL — ABNORMAL HIGH (ref 0.61–1.24)
GFR calc Af Amer: 12 mL/min — ABNORMAL LOW (ref >60)
GFR calc non Af Amer: 10 mL/min — ABNORMAL LOW (ref >60)
Glucose, Bld: 188 mg/dL — ABNORMAL HIGH (ref 70–99)
Potassium: 4 mmol/L (ref 3.5–5.1)
Sodium: 133 mmol/L — ABNORMAL LOW (ref 135–145)
Total Bilirubin: 0.7 mg/dL (ref 0.3–1.2)
Total Protein: 6.4 g/dL — ABNORMAL LOW (ref 6.5–8.1)

## 2019-06-26 LAB — CBC WITH DIFFERENTIAL/PLATELET
Abs Immature Granulocytes: 0.22 10*3/uL — ABNORMAL HIGH (ref 0.00–0.07)
Basophils Absolute: 0 10*3/uL (ref 0.0–0.1)
Basophils Relative: 0 %
Eosinophils Absolute: 0 10*3/uL (ref 0.0–0.5)
Eosinophils Relative: 0 %
HCT: 29.2 % — ABNORMAL LOW (ref 39.0–52.0)
Hemoglobin: 9.7 g/dL — ABNORMAL LOW (ref 13.0–17.0)
Immature Granulocytes: 1 %
Lymphocytes Relative: 4 %
Lymphs Abs: 0.7 10*3/uL (ref 0.7–4.0)
MCH: 29.9 pg (ref 26.0–34.0)
MCHC: 33.2 g/dL (ref 30.0–36.0)
MCV: 90.1 fL (ref 80.0–100.0)
Monocytes Absolute: 0.2 10*3/uL (ref 0.1–1.0)
Monocytes Relative: 1 %
Neutro Abs: 16 10*3/uL — ABNORMAL HIGH (ref 1.7–7.7)
Neutrophils Relative %: 94 %
Platelets: 227 10*3/uL (ref 150–400)
RBC: 3.24 MIL/uL — ABNORMAL LOW (ref 4.22–5.81)
RDW: 11.9 % (ref 11.5–15.5)
WBC: 17.1 10*3/uL — ABNORMAL HIGH (ref 4.0–10.5)
nRBC: 0 % (ref 0.0–0.2)

## 2019-06-26 LAB — BETA-HYDROXYBUTYRIC ACID
Beta-Hydroxybutyric Acid: 0.16 mmol/L (ref 0.05–0.27)
Beta-Hydroxybutyric Acid: 0.13 mmol/L (ref 0.05–0.27)

## 2019-06-26 LAB — BASIC METABOLIC PANEL
Anion gap: 12 (ref 5–15)
BUN: 101 mg/dL — ABNORMAL HIGH (ref 8–23)
CO2: 16 mmol/L — ABNORMAL LOW (ref 22–32)
Calcium: 8 mg/dL — ABNORMAL LOW (ref 8.9–10.3)
Chloride: 107 mmol/L (ref 98–111)
Creatinine, Ser: 4.87 mg/dL — ABNORMAL HIGH (ref 0.61–1.24)
GFR calc Af Amer: 12 mL/min — ABNORMAL LOW (ref >60)
GFR calc non Af Amer: 10 mL/min — ABNORMAL LOW (ref >60)
Glucose, Bld: 164 mg/dL — ABNORMAL HIGH (ref 70–99)
Potassium: 4.4 mmol/L (ref 3.5–5.1)
Sodium: 135 mmol/L (ref 135–145)

## 2019-06-26 LAB — HEMOGLOBIN A1C
Hgb A1c MFr Bld: 9.1 % — ABNORMAL HIGH (ref 4.8–5.6)
Mean Plasma Glucose: 214.47 mg/dL

## 2019-06-26 LAB — C-REACTIVE PROTEIN: CRP: 9.9 mg/dL — ABNORMAL HIGH (ref <1.0)

## 2019-06-26 LAB — FERRITIN: Ferritin: 609 ng/mL — ABNORMAL HIGH (ref 24–336)

## 2019-06-26 LAB — TSH: TSH: 1.88 u[IU]/mL (ref 0.350–4.500)

## 2019-06-26 LAB — MAGNESIUM
Magnesium: 2.1 mg/dL (ref 1.7–2.4)
Magnesium: 2.2 mg/dL (ref 1.7–2.4)

## 2019-06-26 LAB — FIBRIN DERIVATIVES D-DIMER (ARMC ONLY): Fibrin derivatives D-dimer (ARMC): 3766.21 ng/mL (FEU) — ABNORMAL HIGH (ref 0.00–499.00)

## 2019-06-26 MED ORDER — TAMSULOSIN HCL 0.4 MG PO CAPS
0.4000 mg | ORAL_CAPSULE | Freq: Every day | ORAL | Status: DC
  Administered 2019-06-26 – 2019-06-30 (×5): 0.4 mg via ORAL
  Filled 2019-06-26 (×5): qty 1

## 2019-06-26 MED ORDER — ENOXAPARIN SODIUM 40 MG/0.4ML ~~LOC~~ SOLN: 30.0000 mg | SUBCUTANEOUS | Status: DC

## 2019-06-26 MED ORDER — INSULIN ASPART 100 UNIT/ML ~~LOC~~ SOLN
0.0000 [IU] | Freq: Every day | SUBCUTANEOUS | Status: DC
  Administered 2019-06-27: 4 [IU] via SUBCUTANEOUS
  Filled 2019-06-26 (×3): qty 1

## 2019-06-26 MED ORDER — INSULIN ASPART 100 UNIT/ML ~~LOC~~ SOLN: 0.0000 [IU] | Freq: Three times a day (TID) | SUBCUTANEOUS | Status: DC

## 2019-06-26 MED ORDER — HEPARIN SODIUM (PORCINE) 5000 UNIT/ML IJ SOLN
5000.0000 [IU] | Freq: Three times a day (TID) | INTRAMUSCULAR | Status: DC
  Administered 2019-06-26 – 2019-06-29 (×8): 5000 [IU] via SUBCUTANEOUS
  Filled 2019-06-26 (×8): qty 1

## 2019-06-26 MED ORDER — INSULIN ASPART 100 UNIT/ML ~~LOC~~ SOLN
0.0000 [IU] | Freq: Three times a day (TID) | SUBCUTANEOUS | Status: DC
  Administered 2019-06-26: 11 [IU] via SUBCUTANEOUS
  Administered 2019-06-26 – 2019-06-27 (×3): 15 [IU] via SUBCUTANEOUS
  Administered 2019-06-28: 11 [IU] via SUBCUTANEOUS
  Administered 2019-06-28: 4 [IU] via SUBCUTANEOUS
  Administered 2019-06-29 – 2019-06-30 (×3): 3 [IU] via SUBCUTANEOUS
  Filled 2019-06-26 (×10): qty 1

## 2019-06-26 MED ORDER — LINAGLIPTIN 5 MG PO TABS
5.0000 mg | ORAL_TABLET | Freq: Every day | ORAL | Status: DC
  Administered 2019-06-26 – 2019-06-30 (×5): 5 mg via ORAL
  Filled 2019-06-26 (×5): qty 1

## 2019-06-26 MED ORDER — SODIUM CHLORIDE 0.9 % IV SOLN: INTRAVENOUS | Status: DC

## 2019-06-26 MED ORDER — INSULIN DETEMIR 100 UNIT/ML ~~LOC~~ SOLN
15.0000 [IU] | Freq: Every day | SUBCUTANEOUS | Status: DC
  Filled 2019-06-26 (×2): qty 0.15

## 2019-06-26 MED ORDER — DUTASTERIDE 0.5 MG PO CAPS
0.5000 mg | ORAL_CAPSULE | Freq: Every day | ORAL | Status: DC
  Administered 2019-06-27 – 2019-06-30 (×4): 0.5 mg via ORAL
  Filled 2019-06-26 (×5): qty 1

## 2019-06-26 NOTE — ED Notes (Signed)
Report to lorrie, rn.  

## 2019-06-26 NOTE — Progress Notes (Signed)
Patient with resolution of ketotic state and glucose under control without IV isulin.  SW SSI insulin started achs.  Awaiting hgb A1c result to determine as in 01/2019 was 7.1.  Resume home antidiabetic meds when appropriate .  Without need for IV insulin, patient can transfer from stepdown bed to [facility]

## 2019-06-26 NOTE — ED Notes (Signed)
Pt's bs 118. Did not eat lunch but drank some diet soda. Has no complaints of pain or sob. Vss. Still awaiting medications from pharmacy.

## 2019-06-26 NOTE — Progress Notes (Signed)
Pharmacy Note - Anticoagulation  COVID positive patient admitted for respiratory failure.  Current orders for enoxaparin 30mg  SQ Q24H  Estimated Creatinine Clearance: 10.2 mL/min (A) (by C-G formula based on SCr of 4.82 mg/dL (H)). Pt wt: 64kg  Will switch to heparin 5000units SQ Q8H per protocol due to poor renal function, which is in line with recommendations for DVT prophylaxis in COVID patients.   Rexene Edison, PharmD, BCPS Clinical Pharmacist 06/26/2019 1:27 PM

## 2019-06-26 NOTE — Progress Notes (Signed)
PT Cancellation Note  Patient Details Name: [name] MRN: [id number] DOB: October 05, 1931   Cancelled Treatment:    Reason Eval/Treat Not Completed: Medical issues which prohibited therapy(Pt still awaiting VQ scan per [place] MD. Will reattempt PT evaluation at later date/time as appropriate.)  10:10 AM, 06/26/19 Etta Grandchild, PT, DPT Physical Therapist - [facility]  [phone] ([place])    [place] C 06/26/2019, 10:09 AM

## 2019-06-26 NOTE — ED Notes (Signed)
Urinal emptied for 325

## 2019-06-26 NOTE — ED Notes (Signed)
Spoke with brenda morrison, np regarding discontinue to insulin per endotool. Per ms. Randol Kern, leave d51/2ns infusing, check fsbs, BMP again at 0130.

## 2019-06-26 NOTE — Progress Notes (Signed)
Patient removed from [place] by ED RN per MD request.  Patient tolerating room air at this time and in no apparent distress.  Patient states breathing is comfortable without respiratory support.  RT will continue to monitor.

## 2019-06-26 NOTE — Progress Notes (Signed)
PROGRESS NOTE    [name]  ZJQ:[id number] DOB: 04-Dec-1931 DOA: 06/25/2019 PCP: [name], MD      Brief Narrative:  [name] is a 86 y.o. M with dementia, lives in ALF at [place], [place], HTN, PVD and CKD IV baseline 3-3.8 who presented with SOB.    Patient had tested positive for COVID 1/4 (12 days PTA) but doing well.  On day of admission, was eating lunch with daughter through visitation glass when he appeared to be out of breath.  Facility staff found his SpO2 85% and so he was sent to ER.  In the ER, temp 101.52F, HR 120, RR 30.  Spo2 85%.  Lactic acid 3.8.  Na 126.  Glucose 852.  HCO3 15.  Anion gap 18.  Cr 5.33.  WBC 21K.  CXR showed bilateral opacities.  Started on antibiotics, remdesivir and steroids.            Assessment & Plan:  Coronavirus pneumonia with acute hypoxic respiratory failure She presents with SPO2 85%, respiratory rate 30, and bilateral opacities on chest x-ray in the setting of the ongoing 2020-2021 over 19 pandemic  Been weaned from 50 L supplemental oxygen to room air overnight, suggesting that his previous oxygen requirement was spurious.  He was not diuresed, so this acute hypoxia could not be from flash pulmonary edema.  It also is unlikely that PE would cause a rapidly resolving hypoxia of the degree, although this can be ruled out with VQ.    -Continue remdesivir, day 2 of 5 -Continue dexamethasone, day 2 -Continue zinc and vitamin C -Continue Combivent -Obtain VQ scan  Possible sepsis from acute bacterial pneumonia  Presented with leukocytosis, tachycardia, fever, tachypnea, elevated lactate, and possible superimposed bacterial pneumonia. -Continue ceftriaxone and azithromycin -Follow blood cultures   Dementia with acute metabolic encephalopathy Encephalopathy appears somewhat better toay -PT eval -Nutrition consult  Diabetes in DKA Presented with glucose 852, anion gap 18, bicarb 15.  Started on IV fluids and insulin drip.   Gap closed overnight night of admission. -Resume linagliptin -Start Levemir -Start SS corretion insulin  Acute on chronic renal failure Metabolic acidosis Chronic kidney disease stage IV Baseline creatinine 3-3.8.  Minimal creatinine 5.33, came down to 4.8 with fluids, no subsequent improvement. -Daily BMP -Resume calcitriol -Hold diuretics and losartan for now  Peripheral vascular disease, secondary prevention Hypertension BP low -Continue aspirin -Hold amlodipine, losartan   Anemia of chronic kidney disease -Continue iron  BPH -Resume Flomax, dutasteride       Disposition: The patient was admitted with COVID-19 and acute hypoxic respiratory failure and also acute on chronic kidney disease.   I will discharge when his renal function is stabilized, he has demonstrated he is stable off of room air, and we have a follow-up plan for his remdesivir.        MDM: The below labs and imaging reports were reviewed and summarized above.  Medication management as above.  This is an acute illness opposed with threat to life, limb, or bodily function.      DVT prophylaxis: Low-dose Lovenox Code Status: DO NOT RESUSCITATE Family Communication: Daughter by phone    Consultants:   Nephrology  Procedures:     Antimicrobials:       Subjective: Patient's mentation is improved.  He does not feel dyspnea or chest pain.  He is somewhat tachypneic, and has tachycardia, but no vomiting or diarrhea.  Overnight he was able to come off the drip, unfortunately  no insulin was ordered this morning.  Objective: Vitals:   06/25/19 2230 06/25/19 2300 06/25/19 2330 06/26/19 0855  BP: 100/60 112/60 (!) 109/59   Pulse: 63 66 64 72  Resp: (!) 23 (!) 21 20 (!) 24  Temp:      TempSrc:      SpO2: 100% 100% 100% 95%  Weight:      Height:        Intake/Output Summary (Last 24 hours) at 06/26/2019 1144 Last data filed at 06/26/2019 0351 Gross per 24 hour  Intake 3396.43 ml   Output --  Net 3396.43 ml   Filed Weights   06/25/19 1425  Weight: 64.4 kg    Examination: General appearance: Elderly adult male, awake and interactive, and in no acute distress, but appears listless and tired, and somewhat sluggish.   HEENT: Anicteric, conjunctiva pink, lids and lashes normal. No nasal deformity, discharge, epistaxis.  Lips moist, oropharynx tacky dry, no oral lesions.   Skin: Warm and dry.    No suspicious rashes or lesions. Cardiac: Tachycardic, regular, nl S1-S2, no murmurs appreciated.  Capillary refill is brisk.  JVP not visible.  No LE edema.  Radial pulses 2+ and symmetric. Respiratory: Tachypneic, lung sounds diminished, no rales or wheezes on my exam.   Abdomen: Abdomen soft.  No grimace to palpation or guarding or rigidity. No ascites, distension, hepatosplenomegaly.   MSK: No deformities or effusions. Neuro: Awake and alert, oriented to person and place.  EOMI, moves all extremities with severe generalized weakness. Speech fluent.    Psych: Sensorium intact and responding to questions, attention normal. Affect blunted.  Judgment and insight appear normal.    Data Reviewed: I have personally reviewed following labs and imaging studies:  CBC: Recent Labs  Lab 06/25/19 1530 06/26/19 0343  WBC 21.4* 17.1*  NEUTROABS 20.0* 16.0*  HGB 10.4* 9.7*  HCT 31.8* 29.2*  MCV 94.1 90.1  PLT 261 654   Basic Metabolic Panel: Recent Labs  Lab 06/25/19 1530 06/25/19 2210 06/26/19 0136 06/26/19 0343  NA 126* 136 135 133*  K 4.7 3.3* 4.4 4.0  CL 93* 107 107 106  CO2 15* 14* 16* 14*  GLUCOSE 852* 254* 164* 188*  BUN 99* 99* 101* 95*  CREATININE 5.33* 4.71* 4.87* 4.82*  CALCIUM 8.4* 7.9* 8.0* 7.9*  MG  --   --  2.1 2.2   GFR: Estimated Creatinine Clearance: 10.2 mL/min (A) (by C-G formula based on SCr of 4.82 mg/dL (H)). Liver Function Tests: Recent Labs  Lab 06/25/19 1530 06/26/19 0343  AST 34 46*  ALT 24 27  ALKPHOS 53 43  BILITOT 1.1 0.7   PROT 6.7 6.4*  ALBUMIN 3.3* 2.8*   No results for input(s): LIPASE, AMYLASE in the last 168 hours. No results for input(s): AMMONIA in the last 168 hours. Coagulation Profile: Recent Labs  Lab 06/25/19 1530  INR 1.2   Cardiac Enzymes: No results for input(s): CKTOTAL, CKMB, CKMBINDEX, TROPONINI in the last 168 hours. BNP (last 3 results) No results for input(s): PROBNP in the last 8760 hours. HbA1C: Recent Labs    06/26/19 0343  HGBA1C 9.1*   CBG: Recent Labs  Lab 06/26/19 0024 06/26/19 0129 06/26/19 0235 06/26/19 0337 06/26/19 0846  GLUCAP 118* 140* 246* 174* 323*   Lipid Profile: No results for input(s): CHOL, HDL, LDLCALC, TRIG, CHOLHDL, LDLDIRECT in the last 72 hours. Thyroid Function Tests: Recent Labs    06/26/19 0343  TSH 1.880   Anemia Panel: Recent Labs  06/25/19 1530 06/26/19 0343  FERRITIN 663* 609*   Urine analysis:    Component Value Date/Time   COLORURINE STRAW (A) 06/25/2019 1907   APPEARANCEUR HAZY (A) 06/25/2019 1907   LABSPEC 1.012 06/25/2019 1907   PHURINE 5.0 06/25/2019 1907   GLUCOSEU >=500 (A) 06/25/2019 1907   HGBUR SMALL (A) 06/25/2019 [place] NEGATIVE 06/25/2019 [place] NEGATIVE 06/25/2019 1907   PROTEINUR 30 (A) 06/25/2019 1907   NITRITE NEGATIVE 06/25/2019 1907   LEUKOCYTESUR NEGATIVE 06/25/2019 1907   Sepsis Labs: @LABRCNTIP (procalcitonin:4,lacticacidven:4)  ) Recent Results (from the past 240 hour(s))  Blood Culture (routine x 2)     Status: None (Preliminary result)   Collection Time: 06/25/19  1:54 PM   Specimen: BLOOD  Result Value Ref Range Status   Specimen Description BLOOD LEFT ANTECUBITAL  Final   Special Requests   Final    BOTTLES DRAWN AEROBIC AND ANAEROBIC Blood Culture results may not be optimal due to an inadequate volume of blood received in culture bottles   Culture  Setup Time   Final    GRAM POSITIVE COCCI AEROBIC BOTTLE ONLY CRITICAL RESULT CALLED TO, READ BACK BY AND  VERIFIED WITH: DAVID BESANTI AT 0628 06/26/19.PMF Performed at [facility], [address] [place], [place] [address]    Culture [facility] POSITIVE COCCI  Final   Report Status PENDING  Incomplete  Blood Culture (routine x 2)     Status: None (Preliminary result)   Collection Time: 06/25/19  1:54 PM   Specimen: BLOOD  Result Value Ref Range Status   Specimen Description BLOOD BLOOD RIGHT FOREARM  Final   Special Requests   Final    BOTTLES DRAWN AEROBIC AND ANAEROBIC Blood Culture adequate volume   Culture   Final    NO GROWTH < 24 HOURS Performed at [facility], [address] [place], [place] [address]    Report Status PENDING  Incomplete         Radiology Studies: DG Chest Port 1 View  Result Date: 06/25/2019 CLINICAL DATA:  Pt arrived via [facility] from [facility] via [facility] w/ SOB EXAM: PORTABLE CHEST 1 VIEW COMPARISON:  01/19/2019 FINDINGS: Patchy bilateral airspace lung opacities are noted in the mid and lower lungs. No evidence of pulmonary edema. No pleural effusion or pneumothorax. Cardiac silhouette is normal in size. No mediastinal or hilar masses. Skeletal structures are grossly intact. IMPRESSION: Patchy bilateral mid and lower lung zone airspace opacities consistent with multifocal COVID-19 pneumonia. Electronically Signed   By: Lajean Manes M.D.   On: 06/25/2019 14:49        Scheduled Meds: . acetaminophen  650 mg Rectal Once  . vitamin C  500 mg Oral Daily  . aspirin EC  81 mg Oral Daily  . dexamethasone (DECADRON) injection  6 mg Intravenous Q24H  . dutasteride  0.5 mg Oral Daily  . enoxaparin (LOVENOX) injection  30 mg Subcutaneous Q24H  . insulin aspart  0-20 Units Subcutaneous TID WC  . insulin aspart  0-5 Units Subcutaneous QHS  . insulin detemir  15 Units Subcutaneous Daily  . Ipratropium-Albuterol  1 puff Inhalation Q6H  . linagliptin  5 mg Oral Daily  . tamsulosin  0.4 mg Oral QPC breakfast  . zinc sulfate  220 mg  Oral Daily   Continuous Infusions: . sodium chloride 100 mL/hr at 06/26/19 0838  . azithromycin Stopped (06/25/19 2051)  . cefTRIAXone (ROCEPHIN)  IV 1 g (06/26/19 0840)  .  remdesivir 100 mg in NS 100 mL       LOS: 1 day    Time spent: 25 minutes    Edwin Dada, MD [facility] 06/26/2019, 11:44 AM     Please page though [place] or Epic secure chat:  For [employer], Adult nurse

## 2019-06-26 NOTE — ED Notes (Signed)
Pt moved to room 36. Placed on 3 liters [place]. Changed depends of incontinent urine. Pt able to move well.

## 2019-06-26 NOTE — ED Notes (Signed)
ED TO INPATIENT HANDOFF REPORT  ED Nurse Name and Phone #: Dulse Rutan [phone]  S Name/Age/Gender [name] 86 y.o. male Room/Bed: ED36A/ED36A  Code Status   Code Status: DNR  Home/SNF/Other Nursing Home Patient oriented to: self, place and time Is this baseline? Yes      Chief Complaint Acute hypoxemic respiratory failure due to COVID-19 ([place]) [U07.1, J96.01]  Triage Note Pt arrived via [place] from [facility] via [facility] w/ SOB    Allergies Allergies  Allergen Reactions  . Meperidine Other (See Comments)    Severe bradycardia Severe bradycardia   . Penicillins Rash    Level of Care/Admitting Diagnosis ED Disposition    ED Disposition Condition [facility] [facility]: [place] [[id number]]  Level of Care: Stepdown [14]  Covid Evaluation: Confirmed COVID Positive  Diagnosis: Acute hypoxemic respiratory failure due to COVID-19 [facility]) [[id number]]  Admitting Physician: Ezekiel Slocumb [[id number]]  Attending Physician: Ezekiel Slocumb [[id number]]  Estimated length of stay: past midnight tomorrow  Certification:: I certify this patient will need inpatient services for at least 2 midnights  Bed request comments: also with DKA and hypertensive emergency       B Medical/Surgery History Past Medical History:  Diagnosis Date  . Cancer ([place])   . Chronic kidney disease   . Colon polyp   . Diabetes mellitus without complication ([place])   . Heart murmur 2008  . Hyperlipidemia   . Hypertension 1980  . Retinal detachment    Past Surgical History:  Procedure Laterality Date  . COLONOSCOPY  2011,06/23/13   Dr Bary Castilla  . COLONOSCOPY  06-28-2011  . COLONOSCOPY WITH PROPOFOL N/A 08/30/2015   Procedure: COLONOSCOPY WITH PROPOFOL;  Surgeon: [name], MD;  Location: [facility] ENDOSCOPY;  Service: Endoscopy;  Laterality: N/A;  . EYE SURGERY    . HERNIA REPAIR    . INTRAMEDULLARY (IM) NAIL INTERTROCHANTERIC Right 01/20/2019   Procedure:  INTRAMEDULLARY (IM) NAIL INTERTROCHANTRIC;  Surgeon: Leim Fabry, MD;  Location: [facility] ORS;  Service: Orthopedics;  Laterality: Right;  . LOWER EXTREMITY ANGIOGRAPHY Right 04/20/2018   Procedure: Lower Extremity Angiography;  Surgeon: Algernon Huxley, MD;  Location: [facility] LAB;  Service: Cardiovascular;  Laterality: Right;  . POLYPECTOMY  06-23-13  . ]       A IV Location/Drains/Wounds Patient Lines/Drains/Airways Status   Active Line/Drains/Airways    Name:   Placement date:   Placement time:   Site:   Days:   Peripheral IV 06/25/19 Right Forearm   06/25/19    1402    Forearm   1   Peripheral IV 06/25/19 Left Antecubital   06/25/19    --    Antecubital   1   Peripheral IV 06/25/19 Left Forearm   06/25/19    1749    Forearm   1   External Urinary Catheter   01/22/19    0000    --   155   Incision (Closed) 01/20/19 Hip Right   01/20/19    1735     157          Intake/Output Last 24 hours  Intake/Output Summary (Last 24 hours) at 06/26/2019 2122 Last data filed at 06/26/2019 0351 Gross per 24 hour  Intake 746.43 ml  Output --  Net 746.43 ml    Labs/Imaging Results for orders placed or performed during the hospital encounter of 06/25/19 (from the past 48 hour(s))  Blood Culture (routine x 2)  Status: None (Preliminary result)   Collection Time: 06/25/19  1:54 PM   Specimen: BLOOD  Result Value Ref Range   Specimen Description BLOOD LEFT ANTECUBITAL    Special Requests      BOTTLES DRAWN AEROBIC AND ANAEROBIC Blood Culture results may not be optimal due to an inadequate volume of blood received in culture bottles   Culture  Setup Time      GRAM POSITIVE COCCI AEROBIC BOTTLE ONLY CRITICAL RESULT CALLED TO, READ BACK BY AND VERIFIED WITH: DAVID BESANTI AT 9323 06/26/19.PMF Performed at [facility], [address] [place], [place] [address]    Culture GRAM POSITIVE COCCI    Report Status PENDING   Blood Culture (routine x 2)     Status: None (Preliminary  result)   Collection Time: 06/25/19  1:54 PM   Specimen: BLOOD  Result Value Ref Range   Specimen Description BLOOD BLOOD RIGHT FOREARM    Special Requests      BOTTLES DRAWN AEROBIC AND ANAEROBIC Blood Culture adequate volume   Culture      NO GROWTH < 24 HOURS Performed at [facility], [address] [place], [place] [address]    Report Status PENDING   Blood gas, venous ([facility], [facility], [facility])     Status: Abnormal   Collection Time: 06/25/19  2:05 PM  Result Value Ref Range   pH, Ven 7.37 7.250 - 7.430   pCO2, Ven 24 (L) 44.0 - 60.0 mmHg   pO2, Ven 52.0 (H) 32.0 - 45.0 mmHg   Bicarbonate 13.9 (L) 20.0 - 28.0 mmol/L   Acid-base deficit 9.5 (H) 0.0 - 2.0 mmol/L   O2 Saturation 85.2 %   Patient temperature 37.0    Collection site VENIPUNCTURE    Sample type VENIPUNCTURE     Comment: Performed at [facility], [address] [place], [place] [address]  Brain natriuretic peptide     Status: Abnormal   Collection Time: 06/25/19  3:30 PM  Result Value Ref Range   B Natriuretic Peptide 203.0 (H) 0.0 - 100.0 pg/mL    Comment: Performed at [facility], [address] [place], [place] [address]  CBC with Differential/Platelet     Status: Abnormal   Collection Time: 06/25/19  3:30 PM  Result Value Ref Range   WBC 21.4 (H) 4.0 - 10.5 K/uL   RBC 3.38 (L) 4.22 - 5.81 MIL/uL   Hemoglobin 10.4 (L) 13.0 - 17.0 g/dL   HCT 31.8 (L) 39.0 - 52.0 %   MCV 94.1 80.0 - 100.0 fL   MCH 30.8 26.0 - 34.0 pg   MCHC 32.7 30.0 - 36.0 g/dL   RDW 12.2 11.5 - 15.5 %   Platelets 261 150 - 400 K/uL   nRBC 0.0 0.0 - 0.2 %   Neutrophils Relative % 94 %   Neutro Abs 20.0 (H) 1.7 - 7.7 K/uL   Lymphocytes Relative 2 %   Lymphs Abs 0.5 (L) 0.7 - 4.0 K/uL   Monocytes Relative 3 %   Monocytes Absolute 0.6 0.1 - 1.0 K/uL   Eosinophils Relative 0 %   Eosinophils Absolute 0.0 0.0 - 0.5 K/uL   Basophils Relative 0 %   Basophils Absolute 0.0 0.0 - 0.1 K/uL   Immature Granulocytes 1 %    Abs Immature Granulocytes 0.29 (H) 0.00 - 0.07 K/uL    Comment: Performed at [facility], [address], [place], [place] [address]  Comprehensive metabolic panel  Status: Abnormal   Collection Time: 06/25/19  3:30 PM  Result Value Ref Range   Sodium 126 (L) 135 - 145 mmol/L   Potassium 4.7 3.5 - 5.1 mmol/L   Chloride 93 (L) 98 - 111 mmol/L   CO2 15 (L) 22 - 32 mmol/L   Glucose, Bld 852 (HH) 70 - 99 mg/dL    Comment: CRITICAL RESULT CALLED TO, READ BACK BY AND VERIFIED WITH MITCH BARKER @1623  06/25/19 MJU    BUN 99 (H) 8 - 23 mg/dL    Comment: RESULT CONFIRMED BY MANUAL DILUTION MJU   Creatinine, Ser 5.33 (H) 0.61 - 1.24 mg/dL   Calcium 8.4 (L) 8.9 - 10.3 mg/dL   Total Protein 6.7 6.5 - 8.1 g/dL   Albumin 3.3 (L) 3.5 - 5.0 g/dL   AST 34 15 - 41 U/L   ALT 24 0 - 44 U/L   Alkaline Phosphatase 53 38 - 126 U/L   Total Bilirubin 1.1 0.3 - 1.2 mg/dL   GFR calc non Af Amer 9 (L) >60 mL/min   GFR calc Af Amer 10 (L) >60 mL/min   Anion gap 18 (H) 5 - 15    Comment: Performed at [facility], [address] [place], [place] [address]  C-reactive protein     Status: Abnormal   Collection Time: 06/25/19  3:30 PM  Result Value Ref Range   CRP 10.3 (H) <1.0 mg/dL    Comment: Performed at [facility], [address], [place], [place] [address]  Ferritin     Status: Abnormal   Collection Time: 06/25/19  3:30 PM  Result Value Ref Range   Ferritin 663 (H) 24 - 336 ng/mL    Comment: Performed at [facility], [address] [place], [place] [address]  Lactic acid, plasma     Status: Abnormal   Collection Time: 06/25/19  3:30 PM  Result Value Ref Range   Lactic Acid, Venous 3.8 (HH) 0.5 - 1.9 mmol/L    Comment: CRITICAL RESULT CALLED TO, READ BACK BY AND VERIFIED WITH MITCH BARKER @1559  06/25/19 MJU Performed at [facility], [address] [place], [place] [address]   Lactate dehydrogenase     Status: Abnormal   Collection Time:  06/25/19  3:30 PM  Result Value Ref Range   LDH 296 (H) 98 - 192 U/L    Comment: Performed at [facility], [address] [place],  [address]  Procalcitonin     Status: None   Collection Time: 06/25/19  3:30 PM  Result Value Ref Range   Procalcitonin 0.35 ng/mL    Comment:        Interpretation: PCT (Procalcitonin) <= 0.5 ng/mL: Systemic infection (sepsis) is not likely. Local bacterial infection is possible. (NOTE)       Sepsis PCT Algorithm           Lower Respiratory Tract                                      Infection PCT Algorithm    ----------------------------     ----------------------------         PCT < 0.25 ng/mL                PCT < 0.10 ng/mL         Strongly encourage  Strongly discourage   discontinuation of antibiotics    initiation of antibiotics    ----------------------------     -----------------------------       PCT 0.25 - 0.50 ng/mL            PCT 0.10 - 0.25 ng/mL               OR       >80% decrease in PCT            Discourage initiation of                                            antibiotics      Encourage discontinuation           of antibiotics    ----------------------------     -----------------------------         PCT >= 0.50 ng/mL              PCT 0.26 - 0.50 ng/mL               AND        <80% decrease in PCT             Encourage initiation of                                             antibiotics       Encourage continuation           of antibiotics    ----------------------------     -----------------------------        PCT >= 0.50 ng/mL                  PCT > 0.50 ng/mL               AND         increase in PCT                  Strongly encourage                                      initiation of antibiotics    Strongly encourage escalation           of antibiotics                                     -----------------------------                                           PCT <= 0.25 ng/mL                                                  OR                                        >  80% decrease in PCT                                     Discontinue / Do not initiate                                             antibiotics Performed at [facility], [address] [place], [place] [address]   Protime-INR     Status: Abnormal   Collection Time: 06/25/19  3:30 PM  Result Value Ref Range   Prothrombin Time 15.4 (H) 11.4 - 15.2 seconds   INR 1.2 0.8 - 1.2    Comment: (NOTE) INR goal varies based on device and disease states. Performed at [facility], [address] [place], [place] [address]   APTT     Status: None   Collection Time: 06/25/19  3:30 PM  Result Value Ref Range   aPTT 33 24 - 36 seconds    Comment: Performed at [facility], [place], [place] [address]  Troponin I (High Sensitivity)     Status: Abnormal   Collection Time: 06/25/19  3:30 PM  Result Value Ref Range   Troponin I (High Sensitivity) 91 (H) <18 ng/L    Comment: (NOTE) Elevated high sensitivity troponin I (hsTnI) values and significant  changes across serial measurements may suggest ACS but many other  chronic and acute conditions are known to elevate hsTnI results.  Refer to the Links section for chest pain algorithms and additional  guidance. Performed at [facility], [address] [place], [place] [address]   ABO/Rh     Status: None   Collection Time: 06/25/19  3:31 PM  Result Value Ref Range   ABO/RH(D)      A POS Performed at [facility], [address] [place], [place] [address]   Lactic acid, plasma     Status: None   Collection Time: 06/25/19  4:21 PM  Result Value Ref Range   Lactic Acid, Venous 1.8 0.5 - 1.9 mmol/L    Comment: Performed at [facility], [address] [place], [place] [address]  Glucose, capillary     Status: Abnormal   Collection Time: 06/25/19  5:47 PM  Result Value Ref Range    Glucose-Capillary >600 (HH) 70 - 99 mg/dL  Glucose, capillary     Status: Abnormal   Collection Time: 06/25/19  6:40 PM  Result Value Ref Range   Glucose-Capillary >600 (HH) 70 - 99 mg/dL  Urinalysis, Complete w Microscopic     Status: Abnormal   Collection Time: 06/25/19  7:07 PM  Result Value Ref Range   Color, Urine STRAW (A) YELLOW   APPearance HAZY (A) CLEAR   Specific Gravity, Urine 1.012 1.005 - 1.030   pH 5.0 5.0 - 8.0   Glucose, UA >=500 (A) NEGATIVE mg/dL   Hgb urine dipstick SMALL (A) NEGATIVE   Bilirubin Urine NEGATIVE NEGATIVE   Ketones, ur NEGATIVE NEGATIVE mg/dL   Protein, ur 30 (A) NEGATIVE mg/dL   Nitrite NEGATIVE NEGATIVE   Leukocytes,Ua NEGATIVE NEGATIVE   RBC / HPF 0-5 0 - 5 RBC/hpf   WBC, UA NONE SEEN 0 - 5 WBC/hpf   Bacteria, UA RARE (A) NONE SEEN   Squamous Epithelial / LPF  NONE SEEN 0 - 5    Comment: Performed at [facility], [address] [place], [place] [address]  Blood gas, venous     Status: Abnormal   Collection Time: 06/25/19  7:08 PM  Result Value Ref Range   pH, Ven 7.34 7.250 - 7.430   pCO2, Ven 25 (L) 44.0 - 60.0 mmHg   pO2, Ven 42.0 32.0 - 45.0 mmHg   Bicarbonate 13.5 (L) 20.0 - 28.0 mmol/L   Acid-base deficit NEGATIVE 10.5 0.0 - 2.0 mmol/L   O2 Saturation 65.1 %   Collection site VEIN    Sample type VENOUS     Comment: Performed at [facility], [address] [place], [place] [address]  Glucose, capillary     Status: Abnormal   Collection Time: 06/25/19  7:13 PM  Result Value Ref Range   Glucose-Capillary 533 (HH) 70 - 99 mg/dL  Glucose, capillary     Status: Abnormal   Collection Time: 06/25/19  7:48 PM  Result Value Ref Range   Glucose-Capillary 518 (HH) 70 - 99 mg/dL   Comment 1 Document in Chart   Glucose, capillary     Status: Abnormal   Collection Time: 06/25/19  8:15 PM  Result Value Ref Range   Glucose-Capillary 442 (H) 70 - 99 mg/dL  Glucose, capillary     Status: Abnormal   Collection Time:  06/25/19  8:50 PM  Result Value Ref Range   Glucose-Capillary 377 (H) 70 - 99 mg/dL  Glucose, capillary     Status: Abnormal   Collection Time: 06/25/19  9:59 PM  Result Value Ref Range   Glucose-Capillary 269 (H) 70 - 99 mg/dL  Beta-hydroxybutyric acid     Status: None   Collection Time: 06/25/19 10:10 PM  Result Value Ref Range   Beta-Hydroxybutyric Acid 0.09 0.05 - 0.27 mmol/L    Comment: Performed at [facility], [address] [place], [place] [address]  Basic metabolic panel     Status: Abnormal   Collection Time: 06/25/19 10:10 PM  Result Value Ref Range   Sodium 136 135 - 145 mmol/L   Potassium 3.3 (L) 3.5 - 5.1 mmol/L   Chloride 107 98 - 111 mmol/L   CO2 14 (L) 22 - 32 mmol/L   Glucose, Bld 254 (H) 70 - 99 mg/dL   BUN 99 (H) 8 - 23 mg/dL   Creatinine, Ser 4.71 (H) 0.61 - 1.24 mg/dL   Calcium 7.9 (L) 8.9 - 10.3 mg/dL   GFR calc non Af Amer 10 (L) >60 mL/min   GFR calc Af Amer 12 (L) >60 mL/min   Anion gap 15 5 - 15    Comment: Performed at [facility], [address] [place], [place] [address]  Glucose, capillary     Status: Abnormal   Collection Time: 06/25/19 11:18 PM  Result Value Ref Range   Glucose-Capillary 190 (H) 70 - 99 mg/dL  Glucose, capillary     Status: Abnormal   Collection Time: 06/26/19 12:24 AM  Result Value Ref Range   Glucose-Capillary 118 (H) 70 - 99 mg/dL  Glucose, capillary     Status: Abnormal   Collection Time: 06/26/19  1:29 AM  Result Value Ref Range   Glucose-Capillary 140 (H) 70 - 99 mg/dL  Beta-hydroxybutyric acid     Status: None   Collection Time: 06/26/19  1:36 AM  Result Value Ref Range   Beta-Hydroxybutyric Acid 0.16 0.05 - 0.27 mmol/L    Comment: Performed  at [facility], [address] [place], [place] [address]  Basic metabolic panel     Status: Abnormal   Collection Time: 06/26/19  1:36 AM  Result Value Ref Range   Sodium 135 135 - 145 mmol/L   Potassium 4.4 3.5 - 5.1 mmol/L   Chloride  107 98 - 111 mmol/L   CO2 16 (L) 22 - 32 mmol/L   Glucose, Bld 164 (H) 70 - 99 mg/dL   BUN 101 (H) 8 - 23 mg/dL    Comment: RESULT CONFIRMED BY MANUAL DILUTION [place]   Creatinine, Ser 4.87 (H) 0.61 - 1.24 mg/dL   Calcium 8.0 (L) 8.9 - 10.3 mg/dL   GFR calc non Af Amer 10 (L) >60 mL/min   GFR calc Af Amer 12 (L) >60 mL/min   Anion gap 12 5 - 15    Comment: Performed at [facility], [address] [place], [place] [address]  Magnesium     Status: None   Collection Time: 06/26/19  1:36 AM  Result Value Ref Range   Magnesium 2.1 1.7 - 2.4 mg/dL    Comment: Performed at [facility], [address] [place], [place] [address]  Glucose, capillary     Status: Abnormal   Collection Time: 06/26/19  2:35 AM  Result Value Ref Range   Glucose-Capillary 246 (H) 70 - 99 mg/dL  Glucose, capillary     Status: Abnormal   Collection Time: 06/26/19  3:37 AM  Result Value Ref Range   Glucose-Capillary 174 (H) 70 - 99 mg/dL  CBC with Differential/Platelet     Status: Abnormal   Collection Time: 06/26/19  3:43 AM  Result Value Ref Range   WBC 17.1 (H) 4.0 - 10.5 K/uL   RBC 3.24 (L) 4.22 - 5.81 MIL/uL   Hemoglobin 9.7 (L) 13.0 - 17.0 g/dL   HCT 29.2 (L) 39.0 - 52.0 %   MCV 90.1 80.0 - 100.0 fL   MCH 29.9 26.0 - 34.0 pg   MCHC 33.2 30.0 - 36.0 g/dL   RDW 11.9 11.5 - 15.5 %   Platelets 227 150 - 400 K/uL   nRBC 0.0 0.0 - 0.2 %   Neutrophils Relative % 94 %   Neutro Abs 16.0 (H) 1.7 - 7.7 K/uL   Lymphocytes Relative 4 %   Lymphs Abs 0.7 0.7 - 4.0 K/uL   Monocytes Relative 1 %   Monocytes Absolute 0.2 0.1 - 1.0 K/uL   Eosinophils Relative 0 %   Eosinophils Absolute 0.0 0.0 - 0.5 K/uL   Basophils Relative 0 %   Basophils Absolute 0.0 0.0 - 0.1 K/uL   Immature Granulocytes 1 %   Abs Immature Granulocytes 0.22 (H) 0.00 - 0.07 K/uL    Comment: Performed at [facility], [address] [place], [place] [address]  Comprehensive metabolic panel     Status: Abnormal    Collection Time: 06/26/19  3:43 AM  Result Value Ref Range   Sodium 133 (L) 135 - 145 mmol/L   Potassium 4.0 3.5 - 5.1 mmol/L   Chloride 106 98 - 111 mmol/L   CO2 14 (L) 22 - 32 mmol/L   Glucose, Bld 188 (H) 70 - 99 mg/dL   BUN 95 (H) 8 - 23 mg/dL    Comment: RESULT CONFIRMED BY MANUAL DILUTION/RWW   Creatinine, Ser 4.82 (H) 0.61 - 1.24 mg/dL   Calcium 7.9 (L) 8.9 - 10.3 mg/dL   Total Protein 6.4 (L) 6.5 - 8.1 g/dL  Albumin 2.8 (L) 3.5 - 5.0 g/dL   AST 46 (H) 15 - 41 U/L   ALT 27 0 - 44 U/L   Alkaline Phosphatase 43 38 - 126 U/L   Total Bilirubin 0.7 0.3 - 1.2 mg/dL   GFR calc non Af Amer 10 (L) >60 mL/min   GFR calc Af Amer 12 (L) >60 mL/min   Anion gap 13 5 - 15    Comment: Performed at [facility], [address] [place], [place] [address]  C-reactive protein     Status: Abnormal   Collection Time: 06/26/19  3:43 AM  Result Value Ref Range   CRP 9.9 (H) <1.0 mg/dL    Comment: Performed at [facility] [address], [place], [place] [address]  Fibrin derivatives D-Dimer [facility] only)     Status: Abnormal   Collection Time: 06/26/19  3:43 AM  Result Value Ref Range   Fibrin derivatives D-dimer ([facility]) 3,766.21 (H) 0.00 - 499.00 ng/mL (FEU)    Comment: (NOTE) <> Exclusion of Venous Thromboembolism (VTE) - OUTPATIENT ONLY   (Emergency Department or [place])   0-499 ng/ml (FEU): With a low to intermediate pretest probability                      for VTE this test result excludes the diagnosis                      of VTE.   >499 ng/ml (FEU) : VTE not excluded; additional work up for VTE is                      required. <> Testing on Inpatients and Evaluation of Disseminated Intravascular   Coagulation (DIC) Reference Range:   0-499 ng/ml (FEU) Performed at [facility], [address] [place], [place] [address]   Ferritin     Status: Abnormal   Collection Time: 06/26/19  3:43 AM  Result Value Ref Range   Ferritin 609 (H) 24 - 336 ng/mL     Comment: Performed at [facility], [address], [place], [place] [address]  Magnesium     Status: None   Collection Time: 06/26/19  3:43 AM  Result Value Ref Range   Magnesium 2.2 1.7 - 2.4 mg/dL    Comment: Performed at [facility], [address] [place], [place] [address]  Beta-hydroxybutyric acid     Status: None   Collection Time: 06/26/19  3:43 AM  Result Value Ref Range   Beta-Hydroxybutyric Acid 0.13 0.05 - 0.27 mmol/L    Comment: Performed at [facility], [address] [place], [place] [address]  Hemoglobin A1c     Status: Abnormal   Collection Time: 06/26/19  3:43 AM  Result Value Ref Range   Hgb A1c MFr Bld 9.1 (H) 4.8 - 5.6 %    Comment: (NOTE) Pre diabetes:          5.7%-6.4% Diabetes:              >6.4% Glycemic control for   <7.0% adults with diabetes    Mean Plasma Glucose 214.47 mg/dL    Comment: Performed at [facility], [address], [place], [place] [address]  TSH     Status: None   Collection Time: 06/26/19  3:43 AM  Result Value Ref Range   TSH 1.880 0.350 - 4.500 uIU/mL    Comment: Performed by a  3rd Generation assay with a functional sensitivity of <=0.01 uIU/mL. Performed at [facility], [address] [place], [place] [address]   Glucose, capillary     Status: Abnormal   Collection Time: 06/26/19  8:46 AM  Result Value Ref Range   Glucose-Capillary 323 (H) 70 - 99 mg/dL  Glucose, capillary     Status: Abnormal   Collection Time: 06/26/19 12:07 PM  Result Value Ref Range   Glucose-Capillary 293 (H) 70 - 99 mg/dL  Glucose, capillary     Status: Abnormal   Collection Time: 06/26/19  4:59 PM  Result Value Ref Range   Glucose-Capillary 118 (H) 70 - 99 mg/dL   DG Chest Port 1 View  Result Date: 06/25/2019 CLINICAL DATA:  Pt arrived via [place] from [facility] via [facility] w/ SOB EXAM: PORTABLE CHEST 1 VIEW COMPARISON:  01/19/2019 FINDINGS: Patchy bilateral airspace lung opacities are  noted in the mid and lower lungs. No evidence of pulmonary edema. No pleural effusion or pneumothorax. Cardiac silhouette is normal in size. No mediastinal or hilar masses. Skeletal structures are grossly intact. IMPRESSION: Patchy bilateral mid and lower lung zone airspace opacities consistent with multifocal COVID-19 pneumonia. Electronically Signed   By: Lajean Manes M.D.   On: 06/25/2019 14:49    Pending Labs Unresulted Labs (From admission, onward)    Start     Ordered   07/03/19 0500  Creatinine, serum  (enoxaparin (LOVENOX)    CrCl < 30 ml/min)  Weekly,   STAT    Comments: while on enoxaparin therapy.    06/26/19 0746   06/26/19 0500  CBC with Differential/Platelet  Daily,   STAT     06/25/19 1713   06/26/19 0500  Comprehensive metabolic panel  Daily,   STAT     06/25/19 1713   06/26/19 0500  C-reactive protein  Daily,   STAT     06/25/19 1713   06/26/19 0500  Fibrin derivatives D-Dimer ([facility] only)  Daily,   STAT     06/25/19 1713   06/26/19 0500  Ferritin  Daily,   STAT     06/25/19 1713   06/26/19 0500  Magnesium  Daily,   STAT     06/25/19 1713   06/25/19 2018  SARS CORONAVIRUS 2 (TAT 6-24 HRS) Nasopharyngeal In/Out Cath Urine  (Tier 3 (TAT 6-24 hrs))  Once,   STAT    Question Answer Comment  Is this test for diagnosis or screening Screening   Symptomatic for COVID-19 as defined by CDC No   Hospitalized for COVID-19 No   Admitted to ICU for COVID-19 No   Previously tested for COVID-19 Yes   Resident in a congregate (group) care setting No   Employed in healthcare setting No      06/25/19 2017   06/25/19 1404  CBC WITH DIFFERENTIAL  ONCE - STAT,   STAT     06/25/19 1405   06/25/19 1404  Urine culture  ONCE - STAT,   STAT     06/25/19 1405          Vitals/Pain Today's Vitals   06/26/19 1911 06/26/19 1915 06/26/19 2000 06/26/19 2030  BP: (!) 130/56 (!) 138/58 (!) 116/57   Pulse: 71  67 67  Resp:   18   Temp:      TempSrc:      SpO2: 97%  97% 98%  Weight:       Height:      PainSc:  0-No pain     Isolation Precautions Airborne and Contact precautions  Medications Medications  remdesivir 200 mg in sodium chloride 0.9% 250 mL IVPB (0 mg Intravenous Stopped 06/25/19 1540)    Followed by  remdesivir 100 mg in sodium chloride 0.9 % 100 mL IVPB (0 mg Intravenous Stopped 06/26/19 1244)  acetaminophen (TYLENOL) suppository 650 mg (650 mg Rectal Not Given 06/25/19 1619)  dextrose 50 % solution 0-50 mL (has no administration in time range)  potassium chloride 10 mEq in 100 mL IVPB (has no administration in time range)  Ipratropium-Albuterol (COMBIVENT) respimat 1 puff (1 puff Inhalation Given 06/26/19 1930)  dexamethasone (DECADRON) injection 6 mg (6 mg Intravenous Given 06/25/19 1937)  guaiFENesin-dextromethorphan (ROBITUSSIN DM) 100-10 MG/5ML syrup 10 mL (has no administration in time range)  chlorpheniramine-HYDROcodone (TUSSIONEX) 10-8 MG/5ML suspension 5 mL (has no administration in time range)  ascorbic acid (VITAMIN C) tablet 500 mg (500 mg Oral Given 06/25/19 1941)  zinc sulfate capsule 220 mg (220 mg Oral Given 06/25/19 1942)  acetaminophen (TYLENOL) tablet 650 mg (has no administration in time range)  polyethylene glycol (MIRALAX / GLYCOLAX) packet 17 g (has no administration in time range)  bisacodyl (DULCOLAX) suppository 10 mg (has no administration in time range)  magnesium citrate solution 1 Bottle (has no administration in time range)  ondansetron (ZOFRAN) tablet 4 mg (has no administration in time range)    Or  ondansetron (ZOFRAN) injection 4 mg (has no administration in time range)  aspirin EC tablet 81 mg (81 mg Oral Given 06/25/19 1941)  cefTRIAXone (ROCEPHIN) 1 g in sodium chloride 0.9 % 100 mL IVPB (0 g Intravenous Stopped 06/26/19 1203)  azithromycin (ZITHROMAX) 500 mg in sodium chloride 0.9 % 250 mL IVPB (0 mg Intravenous Stopped 06/25/19 2051)  labetalol (NORMODYNE) injection 10 mg (has no administration in time range)  hydrALAZINE  (APRESOLINE) injection 5 mg (has no administration in time range)  dutasteride (AVODART) capsule 0.5 mg (0.5 mg Oral Not Given 06/26/19 2054)  tamsulosin (FLOMAX) capsule 0.4 mg (0.4 mg Oral Given 06/26/19 0844)  linagliptin (TRADJENTA) tablet 5 mg (5 mg Oral Given 06/26/19 1928)  insulin detemir (LEVEMIR) injection 15 Units (15 Units Subcutaneous Not Given 06/26/19 2054)  insulin aspart (novoLOG) injection 0-5 Units (has no administration in time range)  insulin aspart (novoLOG) injection 0-20 Units (0 Units Subcutaneous Not Given 06/26/19 1814)  0.9 %  sodium chloride infusion ( Intravenous New Bag/Given 06/26/19 1159)  heparin injection 5,000 Units (has no administration in time range)  dexamethasone (DECADRON) injection 8 mg (8 mg Intravenous Given 06/25/19 1410)  ceFEPIme (MAXIPIME) 2 g in sodium chloride 0.9 % 100 mL IVPB (0 g Intravenous Stopped 06/25/19 1505)  metroNIDAZOLE (FLAGYL) IVPB 500 mg (0 mg Intravenous Stopped 06/25/19 1518)  vancomycin (VANCOCIN) IVPB 1000 mg/200 mL premix (0 mg Intravenous Stopped 06/25/19 1520)  acetaminophen (TYLENOL) tablet 1,000 mg (1,000 mg Oral Given 06/25/19 1506)  sodium chloride 0.9 % bolus 1,000 mL (0 mLs Intravenous Stopped 06/25/19 1730)  lactated ringers bolus 1,000 mL (0 mLs Intravenous Stopped 06/25/19 1844)  potassium chloride 10 mEq in 100 mL IVPB (0 mEq Intravenous Stopped 06/26/19 0351)    Mobility walks with device     Focused Assessments Pulmonary Assessment Handoff:  Lung sounds:   O2 Device: Nasal Cannula O2 Flow Rate (L/min): 3 L/min      R Recommendations: See Admitting Provider Note  Report given to:   Additional Notes:

## 2019-06-26 NOTE — Progress Notes (Signed)
PHARMACY - PHYSICIAN COMMUNICATION CRITICAL VALUE ALERT - BLOOD CULTURE IDENTIFICATION (BCID)  [name] is an 86 y.o. male who presented to [facility] on 06/25/2019 with a chief complaint of SOB, fatigue  Assessment:  COVID positive pneumonia; 1/4 GPC no BCID  Name of physician (or Provider) Contacted: Myrene Buddy MD  Current antibiotics: azithromycin/ceftriaxone  Changes to prescribed antibiotics recommended:  Patient is on recommended antibiotics - No changes needed  No results found for this or any previous visit.  Tobie Lords, PharmD, BCPS Clinical Pharmacist 06/26/2019  7:28 AM

## 2019-06-27 ENCOUNTER — Inpatient Hospital Stay: Payer: Medicare Other

## 2019-06-27 DIAGNOSIS — L8990 Pressure ulcer of unspecified site, unspecified stage: Secondary | ICD-10-CM | POA: Insufficient documentation

## 2019-06-27 LAB — URINE CULTURE: Culture: <10000 — AB

## 2019-06-27 LAB — CULTURE, BLOOD (ROUTINE X 2)

## 2019-06-27 LAB — CBC WITH DIFFERENTIAL/PLATELET
Abs Immature Granulocytes: 0.28 10*3/uL — ABNORMAL HIGH (ref 0.00–0.07)
Basophils Absolute: 0 10*3/uL (ref 0.0–0.1)
Basophils Relative: 0 %
Eosinophils Absolute: 0 10*3/uL (ref 0.0–0.5)
Eosinophils Relative: 0 %
HCT: 28.1 % — ABNORMAL LOW (ref 39.0–52.0)
Hemoglobin: 9.4 g/dL — ABNORMAL LOW (ref 13.0–17.0)
Immature Granulocytes: 1 %
Lymphocytes Relative: 2 %
Lymphs Abs: 0.5 10*3/uL — ABNORMAL LOW (ref 0.7–4.0)
MCH: 30.4 pg (ref 26.0–34.0)
MCHC: 33.5 g/dL (ref 30.0–36.0)
MCV: 90.9 fL (ref 80.0–100.0)
Monocytes Absolute: 0.4 10*3/uL (ref 0.1–1.0)
Monocytes Relative: 2 %
Neutro Abs: 20.7 10*3/uL — ABNORMAL HIGH (ref 1.7–7.7)
Neutrophils Relative %: 95 %
Platelets: 236 10*3/uL (ref 150–400)
RBC: 3.09 MIL/uL — ABNORMAL LOW (ref 4.22–5.81)
RDW: 12.1 % (ref 11.5–15.5)
WBC: 21.9 10*3/uL — ABNORMAL HIGH (ref 4.0–10.5)
nRBC: 0 % (ref 0.0–0.2)

## 2019-06-27 LAB — COMPREHENSIVE METABOLIC PANEL
ALT: 38 U/L (ref 0–44)
AST: 64 U/L — ABNORMAL HIGH (ref 15–41)
Albumin: 2.8 g/dL — ABNORMAL LOW (ref 3.5–5.0)
Alkaline Phosphatase: 44 U/L (ref 38–126)
Anion gap: 15 (ref 5–15)
BUN: 113 mg/dL — ABNORMAL HIGH (ref 8–23)
CO2: 12 mmol/L — ABNORMAL LOW (ref 22–32)
Calcium: 7.3 mg/dL — ABNORMAL LOW (ref 8.9–10.3)
Chloride: 109 mmol/L (ref 98–111)
Creatinine, Ser: 4.54 mg/dL — ABNORMAL HIGH (ref 0.61–1.24)
GFR calc Af Amer: 13 mL/min — ABNORMAL LOW (ref >60)
GFR calc non Af Amer: 11 mL/min — ABNORMAL LOW (ref >60)
Glucose, Bld: 315 mg/dL — ABNORMAL HIGH (ref 70–99)
Potassium: 4.3 mmol/L (ref 3.5–5.1)
Sodium: 136 mmol/L (ref 135–145)
Total Bilirubin: 0.9 mg/dL (ref 0.3–1.2)
Total Protein: 5.5 g/dL — ABNORMAL LOW (ref 6.5–8.1)

## 2019-06-27 LAB — C-REACTIVE PROTEIN: CRP: 6.3 mg/dL — ABNORMAL HIGH (ref <1.0)

## 2019-06-27 LAB — GLUCOSE, CAPILLARY
Glucose-Capillary: 314 mg/dL — ABNORMAL HIGH (ref 70–99)
Glucose-Capillary: 343 mg/dL — ABNORMAL HIGH (ref 70–99)
Glucose-Capillary: 333 mg/dL — ABNORMAL HIGH (ref 70–99)
Glucose-Capillary: 310 mg/dL — ABNORMAL HIGH (ref 70–99)

## 2019-06-27 LAB — MAGNESIUM: Magnesium: 2.3 mg/dL (ref 1.7–2.4)

## 2019-06-27 LAB — FIBRIN DERIVATIVES D-DIMER (ARMC ONLY): Fibrin derivatives D-dimer (ARMC): 3694.45 ng/mL (FEU) — ABNORMAL HIGH (ref 0.00–499.00)

## 2019-06-27 LAB — FERRITIN: Ferritin: 609 ng/mL — ABNORMAL HIGH (ref 24–336)

## 2019-06-27 MED ORDER — CALCITRIOL 0.25 MCG PO CAPS
0.2500 ug | ORAL_CAPSULE | Freq: Every day | ORAL | Status: DC
  Administered 2019-06-27 – 2019-06-30 (×4): 0.25 ug via ORAL
  Filled 2019-06-27 (×5): qty 1

## 2019-06-27 MED ORDER — SODIUM BICARBONATE 650 MG PO TABS
650.0000 mg | ORAL_TABLET | Freq: Two times a day (BID) | ORAL | Status: DC
  Administered 2019-06-27 – 2019-06-30 (×6): 650 mg via ORAL
  Filled 2019-06-27 (×6): qty 1

## 2019-06-27 MED ORDER — TECHNETIUM TO 99M ALBUMIN AGGREGATED
4.0000 | Freq: Once | INTRAVENOUS | Status: AC | PRN
  Administered 2019-06-27: 3.2 via INTRAVENOUS

## 2019-06-27 MED ORDER — DUTASTERIDE-TAMSULOSIN HCL 0.5-0.4 MG PO CAPS: 1.0000 | ORAL_CAPSULE | Freq: Every day | ORAL | Status: DC

## 2019-06-27 MED ORDER — STERILE WATER FOR INJECTION IV SOLN
INTRAVENOUS | Status: AC
  Filled 2019-06-27 (×2): qty 850

## 2019-06-27 MED ORDER — TRIAMCINOLONE ACETONIDE 0.1 % EX OINT
1.0000 "application " | TOPICAL_OINTMENT | Freq: Two times a day (BID) | CUTANEOUS | Status: DC
  Administered 2019-06-27 – 2019-06-30 (×6): 1 via TOPICAL
  Filled 2019-06-27: qty 15

## 2019-06-27 MED ORDER — INSULIN DETEMIR 100 UNIT/ML ~~LOC~~ SOLN
15.0000 [IU] | Freq: Two times a day (BID) | SUBCUTANEOUS | Status: DC
  Administered 2019-06-27 – 2019-06-28 (×4): 15 [IU] via SUBCUTANEOUS
  Filled 2019-06-27 (×6): qty 0.15

## 2019-06-27 MED ORDER — AMLODIPINE BESYLATE 10 MG PO TABS
10.0000 mg | ORAL_TABLET | Freq: Every day | ORAL | Status: DC
  Administered 2019-06-28 – 2019-06-30 (×3): 10 mg via ORAL
  Filled 2019-06-27 (×3): qty 1

## 2019-06-27 MED ORDER — ADULT MULTIVITAMIN W/MINERALS CH
1.0000 | ORAL_TABLET | Freq: Every day | ORAL | Status: DC
  Administered 2019-06-28 – 2019-06-30 (×3): 1 via ORAL
  Filled 2019-06-27 (×3): qty 1

## 2019-06-27 MED ORDER — INSULIN ASPART 100 UNIT/ML ~~LOC~~ SOLN
4.0000 [IU] | Freq: Three times a day (TID) | SUBCUTANEOUS | Status: DC
  Administered 2019-06-27 – 2019-06-30 (×7): 4 [IU] via SUBCUTANEOUS
  Filled 2019-06-27 (×7): qty 1

## 2019-06-27 NOTE — Progress Notes (Signed)
Patient admitted to unit at approximately 2230. He is alert and oriented to himself and knows his birthday. VSS, outside of hypoxic reading of 89% on 2L. Oxygen raised to 3L, patient currently satting at 90%.   Writer attempted to administer night medications and twice the patient coughed and spit up medications and water. Will pass along to day shift RN to have speech pathology come and evaluate.   He has a large sacral wound noted to buttocks, will need wound consult.   Will monitor patient closely throughout duration of my shift. He is currently resting quietly and free of any distress.

## 2019-06-27 NOTE — Plan of Care (Signed)

## 2019-06-27 NOTE — Progress Notes (Signed)
PROGRESS NOTE    [name]  AST:[id number] DOB: Oct 26, 1931 DOA: 06/25/2019 PCP: [name], MD      Brief Narrative:  [name] is a 86 y.o. M with dementia, lives in ALF at [place], [place], HTN, PVD and CKD IV baseline 3-3.8 who presented with SOB.    Patient had tested positive for COVID 1/4 (12 days PTA) but doing well.  On day of admission, was eating lunch with daughter through visitation glass when he appeared to be out of breath.  Facility staff found his SpO2 85% and so he was sent to ER.  In the ER, temp 101.73F, HR 120, RR 30.  Spo2 85%.  Lactic acid 3.8.  Na 126.  Glucose 852.  HCO3 15.  Anion gap 18.  Cr 5.33.  WBC 21K.  CXR showed bilateral opacities.  Started on antibiotics, remdesivir and steroids.            Assessment & Plan:  Coronavirus pneumonia with acute hypoxic respiratory failure Pt presents with SPO2 85%, respiratory rate 30, and bilateral opacities on chest x-ray in the setting of the ongoing 2020-2021 over 19 pandemic  Was weaned from 50 L supplemental oxygen to room air overnight on admission, suggesting that his previous oxygen requirement was spurious.  He was not diuresed, so this acute hypoxia could not be from flash pulmonary edema.  It also is unlikely that PE would cause a rapidly resolving hypoxia of the degree, and this was ruled out with nuclear perfusion scanning.  Up to 3L today. Afebrile.  Inflammatory markers elevated.  -Continue remdesivir, day 3 of 5 -Continue dexamethasone, day 3 -Continue zinc and vitamin C -Continue Combivent     Possible sepsis from acute bacterial pneumonia  Presented with leukocytosis, tachycardia, fever, tachypnea, elevated lactate, and possible superimposed bacterial pneumonia.  Started on antibiotics, fever resolved. -Continue ceftriaxone and azithromycin -Follow blood cultures   Dementia with acute metabolic encephalopathy Still somewhat confused. -PT eval -Nutrition consult  Diabetes  in DKA Presented with glucose 852, anion gap 18, bicarb 15.  Started on IV fluids and insulin drip.  Gap closed overnight night of admission.  Glucoses very high now -Continue linagliptin -Continue Levemir, increase to BID -Add mealtime insulin -Continue SS correction insulin  Acute on chronic renal failure Metabolic acidosis Chronic kidney disease stage IV Baseline creatinine 3-3.8.  Minimal creatinine 5.33, came down to 4.8 with fluids, today down slightly more to 4.5.  However, acidosis worsening. -Daily BMP -Continue calcitriol -Hold diuretics and losartan for now -Start IV bicarb fluids  Peripheral vascular disease, secondary prevention Hypertension BP soft -Continue aspirin -Hold amlodipine, losartan   Anemia of chronic kidney disease -Continue iron  BPH -Continue Flomax, dutasteride   Stage II pressure injury, sacrum, POA Moderate protein calorie malnutrition  As evidenced by diffuse loss of subcutaneous muscle mass and fat, advanced age, COVID, and pre-existing pressure wound. -WOC consult -Nutrition consult           Disposition: The patient was admitted with COVID-19 and acute hypoxic respiratory failure and also acute on chronic kidney disease.    He remains on supplemental O2 and requires ongoing IV remdesivir.   I will plan for 5 days remdsivir and close monitoring of respiratory status, possibly discharge back to SNF with supplemental O2.       MDM: The below labs and imaging reports reviewed and summarized above.  Medication management as above.       DVT prophylaxis: Low-dose Lovenox Code Status:  DO NOT RESUSCITATE Family Communication: Daughter by phone    Consultants:   Nephrology  Procedures:     Antimicrobials:      Culture data:            Subjective: He is oriented to self, and the events of today.  He feels extremely weak and tired, but no chest pain, dyspnea.  No obvious diarrhea or vomiting.  Glucose  is elevated.      Objective: Vitals:   06/26/19 2100 06/26/19 2216 06/26/19 2345 06/27/19 0327  BP:  (!) 112/51  (!) 115/54  Pulse: 71 72 70 69  Resp:      Temp:  (!) 97.5 F (36.4 C)  97.8 F (36.6 C)  TempSrc:  Oral  Oral  SpO2: 99% (!) 89% 90% 92%  Weight:  74.2 kg    Height:  5\' 8"  (1.727 m)      Intake/Output Summary (Last 24 hours) at 06/27/2019 0815 Last data filed at 06/27/2019 0019 Gross per 24 hour  Intake --  Output 0 ml  Net 0 ml   Filed Weights   06/25/19 1425 06/26/19 2216  Weight: 64.4 kg 74.2 kg    Examination: General appearance: Elderly adult male, sleeping but easily arousable, no acute distress.,  Appears listless and very tired. HEENT: Anicteric, conjunctiva pink, lids and lashes normal. No nasal deformity, discharge, epistaxis.  Lips moist, teeth normal. OP dry, no oral lesions.  Hearing normal. Skin: Warm and dry.  No suspicious rashes or lesions. Cardiac: RRR, no murmurs appreciated.  No LE edema.    Respiratory: Tachypneic, but no accessory muscle use.  Lung sounds diminished bilaterally, no wheezing. Abdomen: Abdomen soft.  No tenderness palpation or guarding. No ascites, distension, hepatosplenomegaly.   MSK: No deformities or effusions of the large joints of the upper or lower extremities bilaterally. Neuro: Sleeping but arouses easily. Naming is grossly intact, and the patient's recall, recent and remote, as well as general fund of knowledge seem within normal limits, possibly mildly impaired by dementia.  Muscle tone diminished, without fasciculations.  Moves upper extremities with severe generalized weakness, symmetric coordination, speech fluent. Psych: Sensorium intact and responding to questions, attention diminished. Affect blunted.  Judgment and insight appear normal.    Data Reviewed: I have personally reviewed following labs and imaging studies:  CBC: Recent Labs  Lab 06/25/19 1530 06/26/19 0343 06/27/19 0407  WBC 21.4* 17.1*  21.9*  NEUTROABS 20.0* 16.0* 20.7*  HGB 10.4* 9.7* 9.4*  HCT 31.8* 29.2* 28.1*  MCV 94.1 90.1 90.9  PLT 261 227 185   Basic Metabolic Panel: Recent Labs  Lab 06/25/19 1530 06/25/19 2210 06/26/19 0136 06/26/19 0343 06/27/19 0407  NA 126* 136 135 133* 136  K 4.7 3.3* 4.4 4.0 4.3  CL 93* 107 107 106 109  CO2 15* 14* 16* 14* 12*  GLUCOSE 852* 254* 164* 188* 315*  BUN 99* 99* 101* 95* 113*  CREATININE 5.33* 4.71* 4.87* 4.82* 4.54*  CALCIUM 8.4* 7.9* 8.0* 7.9* 7.3*  MG  --   --  2.1 2.2 2.3   GFR: Estimated Creatinine Clearance: 11.5 mL/min (A) (by C-G formula based on SCr of 4.54 mg/dL (H)). Liver Function Tests: Recent Labs  Lab 06/25/19 1530 06/26/19 0343 06/27/19 0407  AST 34 46* 64*  ALT 24 27 38  ALKPHOS 53 43 44  BILITOT 1.1 0.7 0.9  PROT 6.7 6.4* 5.5*  ALBUMIN 3.3* 2.8* 2.8*   No results for input(s): LIPASE, AMYLASE in the last 168  hours. No results for input(s): AMMONIA in the last 168 hours. Coagulation Profile: Recent Labs  Lab 06/25/19 1530  INR 1.2   Cardiac Enzymes: No results for input(s): CKTOTAL, CKMB, CKMBINDEX, TROPONINI in the last 168 hours. BNP (last 3 results) No results for input(s): PROBNP in the last 8760 hours. HbA1C: Recent Labs    06/26/19 0343  HGBA1C 9.1*   CBG: Recent Labs  Lab 06/26/19 0337 06/26/19 0846 06/26/19 1207 06/26/19 1659 06/26/19 2219  GLUCAP 174* 323* 293* 118* 185*   Lipid Profile: No results for input(s): CHOL, HDL, LDLCALC, TRIG, CHOLHDL, LDLDIRECT in the last 72 hours. Thyroid Function Tests: Recent Labs    06/26/19 0343  TSH 1.880   Anemia Panel: Recent Labs    06/26/19 0343 06/27/19 0407  FERRITIN 609* 609*   Urine analysis:    Component Value Date/Time   COLORURINE STRAW (A) 06/25/2019 1907   APPEARANCEUR HAZY (A) 06/25/2019 1907   LABSPEC 1.012 06/25/2019 1907   PHURINE 5.0 06/25/2019 1907   GLUCOSEU >=500 (A) 06/25/2019 1907   HGBUR SMALL (A) 06/25/2019 1907   BILIRUBINUR  NEGATIVE 06/25/2019 [place] NEGATIVE 06/25/2019 1907   PROTEINUR 30 (A) 06/25/2019 1907   NITRITE NEGATIVE 06/25/2019 1907   LEUKOCYTESUR NEGATIVE 06/25/2019 1907   Sepsis Labs: @LABRCNTIP (procalcitonin:4,lacticacidven:4)  ) Recent Results (from the past 240 hour(s))  Blood Culture (routine x 2)     Status: None (Preliminary result)   Collection Time: 06/25/19  1:54 PM   Specimen: BLOOD  Result Value Ref Range Status   Specimen Description   Final    BLOOD LEFT ANTECUBITAL Performed at [facility], [address] [place], [place] [address]    Special Requests   Final    BOTTLES DRAWN AEROBIC AND ANAEROBIC Blood Culture results may not be optimal due to an inadequate volume of blood received in culture bottles Performed at [facility], [address] [place], [place] [address]    Culture  Setup Time   Final    GRAM POSITIVE COCCI AEROBIC BOTTLE ONLY CRITICAL RESULT CALLED TO, READ BACK BY AND VERIFIED WITH: DAVID BESANTI AT 1194 06/26/19.PMF Performed at [facility], [address] [place], [place] [address]    Culture   Final    Lonell Grandchild POSITIVE COCCI IDENTIFICATION TO FOLLOW Performed at [facility], [place] [address] [place], [place] [address]    Report Status PENDING  Incomplete  Blood Culture (routine x 2)     Status: None (Preliminary result)   Collection Time: 06/25/19  1:54 PM   Specimen: BLOOD  Result Value Ref Range Status   Specimen Description BLOOD BLOOD RIGHT FOREARM  Final   Special Requests   Final    BOTTLES DRAWN AEROBIC AND ANAEROBIC Blood Culture adequate volume   Culture   Final    NO GROWTH 2 DAYS Performed at [facility], [address] [place], [place] [address]    Report Status PENDING  Incomplete         Radiology Studies: DG Chest Port 1 View  Result Date: 06/25/2019 CLINICAL DATA:  Pt arrived via [facility] from [facility] via [facility] w/ SOB EXAM: PORTABLE CHEST 1 VIEW  COMPARISON:  01/19/2019 FINDINGS: Patchy bilateral airspace lung opacities are noted in the mid and lower lungs. No evidence of pulmonary edema. No pleural effusion or pneumothorax. Cardiac silhouette is normal in size. No mediastinal or hilar masses. Skeletal structures are grossly intact. IMPRESSION: Patchy bilateral  mid and lower lung zone airspace opacities consistent with multifocal COVID-19 pneumonia. Electronically Signed   By: Lajean Manes M.D.   On: 06/25/2019 14:49        Scheduled Meds: . acetaminophen  650 mg Rectal Once  . vitamin C  500 mg Oral Daily  . aspirin EC  81 mg Oral Daily  . dexamethasone (DECADRON) injection  6 mg Intravenous Q24H  . dutasteride  0.5 mg Oral Daily  . heparin injection (subcutaneous)  5,000 Units Subcutaneous Q8H  . insulin aspart  0-20 Units Subcutaneous TID WC  . insulin aspart  0-5 Units Subcutaneous QHS  . insulin detemir  15 Units Subcutaneous Daily  . Ipratropium-Albuterol  1 puff Inhalation Q6H  . linagliptin  5 mg Oral Daily  . tamsulosin  0.4 mg Oral QPC breakfast  . zinc sulfate  220 mg Oral Daily   Continuous Infusions: . sodium chloride 100 mL/hr at 06/27/19 0721  . azithromycin 500 mg (06/27/19 0049)  . cefTRIAXone (ROCEPHIN)  IV Stopped (06/26/19 1203)  . remdesivir 100 mg in NS 100 mL Stopped (06/26/19 1244)     LOS: 2 days    Time spent: 25 minutes    Edwin Dada, MD [facility] 06/27/2019, 8:15 AM     Please page though [facility] or Epic secure chat:  For [employer], Adult nurse

## 2019-06-28 LAB — MAGNESIUM: Magnesium: 2.5 mg/dL — ABNORMAL HIGH (ref 1.7–2.4)

## 2019-06-28 LAB — GLUCOSE, CAPILLARY
Glucose-Capillary: 54 mg/dL — ABNORMAL LOW (ref 70–99)
Glucose-Capillary: 190 mg/dL — ABNORMAL HIGH (ref 70–99)
Glucose-Capillary: 263 mg/dL — ABNORMAL HIGH (ref 70–99)
Glucose-Capillary: 160 mg/dL — ABNORMAL HIGH (ref 70–99)
Glucose-Capillary: 154 mg/dL — ABNORMAL HIGH (ref 70–99)
Glucose-Capillary: 142 mg/dL — ABNORMAL HIGH (ref 70–99)

## 2019-06-28 LAB — CBC WITH DIFFERENTIAL/PLATELET
Abs Immature Granulocytes: 0.34 10*3/uL — ABNORMAL HIGH (ref 0.00–0.07)
Basophils Absolute: 0 10*3/uL (ref 0.0–0.1)
Basophils Relative: 0 %
Eosinophils Absolute: 0 10*3/uL (ref 0.0–0.5)
Eosinophils Relative: 0 %
HCT: 27.6 % — ABNORMAL LOW (ref 39.0–52.0)
Hemoglobin: 9.6 g/dL — ABNORMAL LOW (ref 13.0–17.0)
Immature Granulocytes: 2 %
Lymphocytes Relative: 2 %
Lymphs Abs: 0.4 10*3/uL — ABNORMAL LOW (ref 0.7–4.0)
MCH: 30.6 pg (ref 26.0–34.0)
MCHC: 34.8 g/dL (ref 30.0–36.0)
MCV: 87.9 fL (ref 80.0–100.0)
Monocytes Absolute: 0.4 10*3/uL (ref 0.1–1.0)
Monocytes Relative: 2 %
Neutro Abs: 17.1 10*3/uL — ABNORMAL HIGH (ref 1.7–7.7)
Neutrophils Relative %: 94 %
Platelets: 260 10*3/uL (ref 150–400)
RBC: 3.14 MIL/uL — ABNORMAL LOW (ref 4.22–5.81)
RDW: 12.2 % (ref 11.5–15.5)
WBC: 18.2 10*3/uL — ABNORMAL HIGH (ref 4.0–10.5)
nRBC: 0 % (ref 0.0–0.2)

## 2019-06-28 LAB — COMPREHENSIVE METABOLIC PANEL
ALT: 35 U/L (ref 0–44)
AST: 43 U/L — ABNORMAL HIGH (ref 15–41)
Albumin: 2.6 g/dL — ABNORMAL LOW (ref 3.5–5.0)
Alkaline Phosphatase: 45 U/L (ref 38–126)
Anion gap: 12 (ref 5–15)
BUN: 111 mg/dL — ABNORMAL HIGH (ref 8–23)
CO2: 17 mmol/L — ABNORMAL LOW (ref 22–32)
Calcium: 7.3 mg/dL — ABNORMAL LOW (ref 8.9–10.3)
Chloride: 114 mmol/L — ABNORMAL HIGH (ref 98–111)
Creatinine, Ser: 4.32 mg/dL — ABNORMAL HIGH (ref 0.61–1.24)
GFR calc Af Amer: 14 mL/min — ABNORMAL LOW (ref >60)
GFR calc non Af Amer: 12 mL/min — ABNORMAL LOW (ref >60)
Glucose, Bld: 56 mg/dL — ABNORMAL LOW (ref 70–99)
Potassium: 3.9 mmol/L (ref 3.5–5.1)
Sodium: 143 mmol/L (ref 135–145)
Total Bilirubin: 0.6 mg/dL (ref 0.3–1.2)
Total Protein: 5.6 g/dL — ABNORMAL LOW (ref 6.5–8.1)

## 2019-06-28 LAB — C-REACTIVE PROTEIN: CRP: 4.2 mg/dL — ABNORMAL HIGH (ref <1.0)

## 2019-06-28 LAB — FERRITIN: Ferritin: 649 ng/mL — ABNORMAL HIGH (ref 24–336)

## 2019-06-28 LAB — FIBRIN DERIVATIVES D-DIMER (ARMC ONLY): Fibrin derivatives D-dimer (ARMC): 3429.07 ng/mL (FEU) — ABNORMAL HIGH (ref 0.00–499.00)

## 2019-06-28 MED ORDER — AZITHROMYCIN 250 MG PO TABS
500.0000 mg | ORAL_TABLET | Freq: Every day | ORAL | Status: AC
  Administered 2019-06-28 – 2019-06-29 (×2): 500 mg via ORAL
  Filled 2019-06-28 (×2): qty 2

## 2019-06-28 MED ORDER — NEPRO/CARBSTEADY PO LIQD
237.0000 mL | Freq: Three times a day (TID) | ORAL | Status: DC
  Administered 2019-06-28 – 2019-06-30 (×5): 237 mL via ORAL

## 2019-06-28 NOTE — Progress Notes (Signed)
PROGRESS NOTE    [name]  UXL:[id number] DOB: 09/12/1931 DOA: 06/25/2019 PCP: [name], MD      Brief Narrative:  [name] is a 86 y.o. M with dementia, lives in ALF at [place], [place], HTN, PVD and CKD IV baseline 3-3.8 who presented with SOB.    Patient had tested positive for COVID 1/4 (12 days PTA) but doing well.  On day of admission, was eating lunch with daughter through visitation glass when he appeared to be out of breath.  Facility staff found his SpO2 85% and so he was sent to ER.  In the ER, temp 101.56F, HR 120, RR 30.  Spo2 85%.  Lactic acid 3.8.  Na 126.  Glucose 852.  HCO3 15.  Anion gap 18.  Cr 5.33.  WBC 21K.  CXR showed bilateral opacities.  Started on antibiotics, remdesivir and steroids.            Assessment & Plan:  Coronavirus pneumonia with acute hypoxic respiratory failure Pt presents with SPO2 85%, respiratory rate 30, and bilateral opacities on chest x-ray in the setting of the ongoing 2020-2021 over 19 pandemic  Was weaned from 50 L supplemental oxygen to room air overnight on admission, suggesting that his previous oxygen requirement was spurious.  He was not diuresed, so this acute hypoxia could not be from flash pulmonary edema.  It also is unlikely that PE would cause a rapidly resolving hypoxia of the degree, and this was ruled out with nuclear perfusion scanning.  [name] saturations up to 5 L.  Afebrile, laboratory markers elevated.  -Continue remdesivir day 4 of 5 -Continue dexamethasone, day 4 -Continue zinc and vitamin C -Continue Combivent     Possible sepsis from acute bacterial pneumonia  Presented with leukocytosis, tachycardia, fever, tachypnea, elevated lactate, and possible superimposed bacterial pneumonia.  Started on antibiotics, fevers resolved. -Continue ceftriaxone and azithromycin -Follow blood cultures   Dementia with acute metabolic encephalopathy Mildly inattentive, encephalopathic -PT eval  -Nutrition consult  Diabetes in DKA Presented with glucose 852, anion gap 18, bicarb 15.  Started on IV fluids and insulin drip.  Gap closed overnight night of admission.  Glucose is improved -Continue linagliptin -Continue Levemir, increase to BID -Continue mealtime insulin -Continue SS correction insulin  Acute on chronic renal failure Metabolic acidosis Chronic kidney disease stage IV Baseline creatinine 3-3.8.  Minimal creatinine 5.33, came down to 4.8 with fluids, today down slightly more to 4.5 creatinine trending down to 4.3, acidosis slightly better -Continue bicarb -Hold fluids -Trend BMP -Hold diuretics and losartan for now -Continue calcitriol    Peripheral vascular disease, secondary prevention Hypertension Blood pressure good -Continue aspirin -Hold amlodipine, losartan   Anemia of chronic kidney disease -Continue iron  BPH -Continue Flomax, dutasteride   Stage II pressure injury, sacrum, POA Moderate protein calorie malnutrition  As evidenced by diffuse loss of subcutaneous muscle mass and fat, advanced age, COVID, and pre-existing pressure wound. -WOC consult -Nutrition consult           Disposition: The patient was admitted with COVID-19 and acute hypoxic respiratory failure and also acute on chronic kidney disease.    He remains on supplemental O2 and requires ongoing IV remdesivir.   I will plan for 5 days remdsivir and close monitoring of respiratory status, possibly discharge back to SNF with supplemental O2.       MDM: The below labs and imaging reports reviewed and summarized above.  Medication management as above.  DVT prophylaxis: Low-dose Lovenox Code Status: DO NOT RESUSCITATE Family Communication:      Consultants:   Nephrology  Procedures:     Antimicrobials:      Culture data:            Subjective: Patient is somewhat sleepy.  Nursing report he desaturated with minimal exertion today  into the 70s, took 20 minutes to recover.      Objective: Vitals:   06/27/19 2033 06/28/19 0357 06/28/19 0830 06/28/19 1000  BP: 108/62 124/64 (!) 129/58   Pulse: 76 69 68   Resp: 19 20 19    Temp: (!) 97.5 F (36.4 C) (!) 97.5 F (36.4 C)    TempSrc: Oral Oral    SpO2: 93% 92% 93% (!) 83%  Weight:      Height:        Intake/Output Summary (Last 24 hours) at 06/28/2019 1906 Last data filed at 06/27/2019 2256 Gross per 24 hour  Intake 1814.49 ml  Output -  Net 1814.49 ml   Filed Weights   06/25/19 1425 06/26/19 2216  Weight: 64.4 kg 74.2 kg    Examination: General appearance: Elderly adult male, awake, appears tired.   HEENT: Anicteric, conjunctiva pink, lids and lashes normal. No nasal deformity, discharge, epistaxis.  Lips moist, teeth normal. OP tacky dry, no oral lesions.  Hearing diminished. Skin: Warm and dry.  No suspicious rashes or lesions. Cardiac: RRR, no murmurs appreciated.  No LE edema.    Respiratory: Tachypnea, shallow, lung sounds diminished, no rales Abdomen: Abdomen soft.  No tenderness palpation or guarding. No ascites, distension, hepatosplenomegaly.   MSK: No deformities or effusions of the large joints of the upper or lower extremities bilaterally. Neuro: Awake but somewhat listless.  Mostly inattentive.  Feebly moves his arms bilaterally and attempt to follow my commands, generalized weakness, speech fluent but hypophonic.  Psych: Inattentive, affect blunted, judgment insight appear impaired by delirium    Data Reviewed: I have personally reviewed following labs and imaging studies:  CBC: Recent Labs  Lab 06/25/19 1530 06/26/19 0343 06/27/19 0407 06/28/19 0358  WBC 21.4* 17.1* 21.9* 18.2*  NEUTROABS 20.0* 16.0* 20.7* 17.1*  HGB 10.4* 9.7* 9.4* 9.6*  HCT 31.8* 29.2* 28.1* 27.6*  MCV 94.1 90.1 90.9 87.9  PLT 261 227 236 789   Basic Metabolic Panel: Recent Labs  Lab 06/25/19 2210 06/26/19 0136 06/26/19 0343 06/27/19 0407 06/28/19  0358  NA 136 135 133* 136 143  K 3.3* 4.4 4.0 4.3 3.9  CL 107 107 106 109 114*  CO2 14* 16* 14* 12* 17*  GLUCOSE 254* 164* 188* 315* 56*  BUN 99* 101* 95* 113* 111*  CREATININE 4.71* 4.87* 4.82* 4.54* 4.32*  CALCIUM 7.9* 8.0* 7.9* 7.3* 7.3*  MG  --  2.1 2.2 2.3 2.5*   GFR: Estimated Creatinine Clearance: 12.1 mL/min (A) (by C-G formula based on SCr of 4.32 mg/dL (H)). Liver Function Tests: Recent Labs  Lab 06/25/19 1530 06/26/19 0343 06/27/19 0407 06/28/19 0358  AST 34 46* 64* 43*  ALT 24 27 38 35  ALKPHOS 53 43 44 45  BILITOT 1.1 0.7 0.9 0.6  PROT 6.7 6.4* 5.5* 5.6*  ALBUMIN 3.3* 2.8* 2.8* 2.6*   No results for input(s): LIPASE, AMYLASE in the last 168 hours. No results for input(s): AMMONIA in the last 168 hours. Coagulation Profile: Recent Labs  Lab 06/25/19 1530  INR 1.2   Cardiac Enzymes: No results for input(s): CKTOTAL, CKMB, CKMBINDEX, TROPONINI in the last 168 hours.  BNP (last 3 results) No results for input(s): PROBNP in the last 8760 hours. HbA1C: Recent Labs    06/26/19 0343  HGBA1C 9.1*   CBG: Recent Labs  Lab 06/27/19 2031 06/28/19 0828 06/28/19 1001 06/28/19 1129 06/28/19 1722  GLUCAP 310* 54* 190* 263* 160*   Lipid Profile: No results for input(s): CHOL, HDL, LDLCALC, TRIG, CHOLHDL, LDLDIRECT in the last 72 hours. Thyroid Function Tests: Recent Labs    06/26/19 0343  TSH 1.880   Anemia Panel: Recent Labs    06/27/19 0407 06/28/19 0358  FERRITIN 609* 649*   Urine analysis:    Component Value Date/Time   COLORURINE STRAW (A) 06/25/2019 1907   APPEARANCEUR HAZY (A) 06/25/2019 1907   LABSPEC 1.012 06/25/2019 1907   PHURINE 5.0 06/25/2019 1907   GLUCOSEU >=500 (A) 06/25/2019 1907   HGBUR SMALL (A) 06/25/2019 [place] NEGATIVE 06/25/2019 [place] NEGATIVE 06/25/2019 1907   PROTEINUR 30 (A) 06/25/2019 1907   NITRITE NEGATIVE 06/25/2019 1907   LEUKOCYTESUR NEGATIVE 06/25/2019 1907   Sepsis Labs:  @LABRCNTIP (procalcitonin:4,lacticacidven:4)  ) Recent Results (from the past 240 hour(s))  Blood Culture (routine x 2)     Status: Abnormal   Collection Time: 06/25/19  1:54 PM   Specimen: BLOOD  Result Value Ref Range Status   Specimen Description   Final    BLOOD LEFT ANTECUBITAL Performed at [facility], [address] [place], [place] [address]    Special Requests   Final    BOTTLES DRAWN AEROBIC AND ANAEROBIC Blood Culture results may not be optimal due to an inadequate volume of blood received in culture bottles Performed at [facility], [address], [place], [place] [address]    Culture  Setup Time   Final    GRAM POSITIVE COCCI AEROBIC BOTTLE ONLY CRITICAL RESULT CALLED TO, READ BACK BY AND VERIFIED WITH: DAVID BESANTI AT 7408 06/26/19.PMF Performed at [facility], [address] [place], [place] [address]    Culture (A)  Final    STAPHYLOCOCCUS SPECIES (COAGULASE NEGATIVE) THE SIGNIFICANCE OF ISOLATING THIS ORGANISM FROM A SINGLE SET OF BLOOD CULTURES WHEN MULTIPLE SETS ARE DRAWN IS UNCERTAIN. PLEASE NOTIFY THE MICROBIOLOGY DEPARTMENT WITHIN ONE WEEK IF SPECIATION AND SENSITIVITIES ARE REQUIRED. Performed at [facility], [place] [address], [place], [place] [address]    Report Status 06/27/2019 FINAL  Final  Blood Culture (routine x 2)     Status: None (Preliminary result)   Collection Time: 06/25/19  1:54 PM   Specimen: BLOOD  Result Value Ref Range Status   Specimen Description BLOOD BLOOD RIGHT FOREARM  Final   Special Requests   Final    BOTTLES DRAWN AEROBIC AND ANAEROBIC Blood Culture adequate volume   Culture   Final    NO GROWTH 3 DAYS Performed at [facility] [address] [place], [place] [address]    Report Status PENDING  Incomplete  Urine culture     Status: Abnormal   Collection Time: 06/25/19  7:07 PM   Specimen: Urine, Random  Result Value Ref Range Status   Specimen Description   Final     URINE, RANDOM Performed at [facility], [address], [place], [place] [address]    Special Requests   Final    NONE Performed at [facility], [address] [place], [place] [address]    Culture (A)  Final    <10,000 COLONIES/mL INSIGNIFICANT GROWTH Performed at [facility], [place] [address], [place], [place] [address]    Report Status 06/27/2019 FINAL  Final         Radiology Studies: NM Pulmonary Perfusion  Result Date: 06/27/2019 CLINICAL DATA:  Respiratory failure.  COVID positive. EXAM: NUCLEAR MEDICINE PERFUSION LUNG SCAN TECHNIQUE: Perfusion images were obtained in multiple projections after intravenous injection of radiopharmaceutical. Ventilation scans intentionally deferred if perfusion scan and chest x-ray adequate for interpretation during COVID 19 epidemic. RADIOPHARMACEUTICALS:  3.2 mCi Tc-39m MAA IV COMPARISON:  Chest x-ray FINDINGS: No segmental or subsegmental perfusion defects to suggest pulmonary embolism. IMPRESSION: Negative pulmonary perfusion study for pulmonary embolism. Electronically Signed   By: Marijo Sanes M.D.   On: 06/27/2019 15:20   DG Chest Port 1 View  Result Date: 06/27/2019 CLINICAL DATA:  Respiratory failure. COVID-19 pneumonia. EXAM: PORTABLE CHEST 1 VIEW COMPARISON:  Chest x-ray 06/25/2019 FINDINGS: Heart size is normal. Patchy airspace opacities are seen bilaterally. Aeration is improving. Lung volumes have slightly improved. IMPRESSION: Improving aeration with persistent patchy bilateral airspace disease. Electronically Signed   By: San Morelle M.D.   On: 06/27/2019 10:07        Scheduled Meds: . amLODipine  10 mg Oral Daily  . vitamin C  500 mg Oral Daily  . aspirin EC  81 mg Oral Daily  . azithromycin  500 mg Oral Daily  . calcitRIOL  0.25 mcg Oral Daily  . dexamethasone (DECADRON) injection  6 mg Intravenous Q24H  . dutasteride  0.5 mg Oral Daily  . feeding supplement (NEPRO CARB STEADY)  237  mL Oral TID WC  . heparin injection (subcutaneous)  5,000 Units Subcutaneous Q8H  . insulin aspart  0-20 Units Subcutaneous TID WC  . insulin aspart  0-5 Units Subcutaneous QHS  . insulin aspart  4 Units Subcutaneous TID WC  . insulin detemir  15 Units Subcutaneous BID  . Ipratropium-Albuterol  1 puff Inhalation Q6H  . linagliptin  5 mg Oral Daily  . multivitamin with minerals  1 tablet Oral Daily  . sodium bicarbonate  650 mg Oral BID  . tamsulosin  0.4 mg Oral QPC breakfast  . triamcinolone ointment  1 application Topical BID  . zinc sulfate  220 mg Oral Daily   Continuous Infusions: . cefTRIAXone (ROCEPHIN)  IV 1 g (06/28/19 0811)  . remdesivir 100 mg in NS 100 mL 100 mg (06/28/19 1024)     LOS: 3 days    Time spent: 25 minutes    [name] Dada, MD [facility] 06/28/2019, 7:06 PM     Please page though [place] or Epic secure chat:  For [employer], Adult nurse

## 2019-06-28 NOTE — Evaluation (Addendum)
Physical Therapy Evaluation Patient Details Name: [name] MRN: [id number] DOB: [birth year] Today's Date: 06/28/2019   History of Present Illness  [name] is a 86 y.o. M with dementia, lives in ALF at [place], [place], HTN, PVD and CKD IV baseline 3-3.8 who presented with SOB. Patient had tested positive for COVID 1/4 (12 days PTA) but doing well.  On day of admission, was eating lunch with daughter through visitation glass when he appeared to be out of breath.  Facility staff found his SpO2 85% and so he was sent to ER.  Clinical Impression  Pt admitted with above diagnosis. Pt currently with functional limitations due to the deficits listed below (see "PT Problem List"). Upon entry, pt in bed, awake and agreeable to participate. Pt on 3L/min resting at 95%, trialed on room air with SpO2 dropping to 83% over 3 minutes. On 3L/min, SpO2 drops to 83% after transfer to recliner. Recovery to >88% requires 3 minutes on 5-6L/min. Pt is very weak, requires maxA to rise to standing, and never able to establish balance. The pt is alert and oriented x3, pleasant, conversational, and generally a good historian. Functional mobility assessment demonstrates increased effort/time requirements, poor tolerance, and need for physical assistance, whereas the patient performed these at a higher level of independence PTA. Pt will benefit from skilled PT intervention to increase independence and safety with basic mobility in preparation for discharge to the venue listed below.       Follow Up Recommendations SNF;Supervision/Assistance - 24 hour;Supervision for mobility/OOB    Equipment Recommendations  None recommended by PT    Recommendations for Other Services       Precautions / Restrictions Precautions Precautions: Fall Restrictions Weight Bearing Restrictions: No      Mobility  Bed Mobility Overal bed mobility: Needs Assistance Bed Mobility: Supine to Sit     Supine to sit: Max assist         Transfers Overall transfer level: Needs assistance Equipment used: Rolling walker (2 wheeled) Transfers: Sit to/from [facility] Sit to Stand: Max assist Stand pivot transfers: Max assist       General transfer comment: Very weak, unable to correct retropulsive lean with or without RW, desat to 83% on 3L/min  Ambulation/Gait Ambulation/Gait assistance: (no appropriate this date.)              Stairs            Wheelchair Mobility    Modified Rankin (Stroke Patients Only)       Balance                                             Pertinent Vitals/Pain Pain Assessment: No/denies pain    Home Living Family/patient expects to be discharged to:: Skilled nursing facility([name])                      Prior Function                 Hand Dominance        Extremity/Trunk Assessment                Communication      Cognition Arousal/Alertness: Awake/alert Behavior During Therapy: WFL for tasks assessed/performed Overall Cognitive Status: Within Functional Limits for tasks assessed  General Comments      Exercises     Assessment/Plan    PT Assessment Patient needs continued PT services  PT Problem List Decreased strength;Decreased range of motion;Decreased activity tolerance;Decreased balance;Decreased mobility       PT Treatment Interventions DME instruction;Therapeutic exercise;Stair training;Functional mobility training;Gait training;Therapeutic activities;Patient/family education    PT Goals (Current goals can be found in the Care Plan section)       Frequency Min 2X/week   Barriers to discharge        Co-evaluation               AM-PAC PT "6 Clicks" Mobility  Outcome Measure Help needed turning from your back to your side while in a flat bed without using bedrails?: Total Help needed moving from lying on your  back to sitting on the side of a flat bed without using bedrails?: Total Help needed moving to and from a bed to a chair (including a wheelchair)?: Total Help needed standing up from a chair using your arms (e.g., wheelchair or bedside chair)?: Total Help needed to walk in hospital room?: Total Help needed climbing 3-5 steps with a railing? : Total 6 Click Score: 6    End of Session Equipment Utilized During Treatment: Oxygen Activity Tolerance: Patient tolerated treatment well;Patient limited by fatigue;Treatment limited secondary to medical complications (Comment)(slow desaturation) Patient left: in chair;with chair alarm set;with call bell/phone within reach   PT Visit Diagnosis: Unsteadiness on feet (R26.81);Other abnormalities of gait and mobility (R26.89);Ataxic gait (R26.0);Difficulty in walking, not elsewhere classified (R26.2)    Time: 8841-6606 PT Time Calculation (min) (ACUTE ONLY): 32 min   Charges:   PT Evaluation $PT Eval Moderate Complexity: 1 Mod PT Treatments $Therapeutic Exercise: 8-22 mins        1:01 PM, 06/28/19 Etta Grandchild, PT, DPT Physical Therapist - [facility]  [phone] ([place])    [place] C 06/28/2019, 12:58 PM

## 2019-06-28 NOTE — NC FL2 (Addendum)
[place] LEVEL OF CARE SCREENING TOOL     IDENTIFICATION  Patient Name: [name] Birthdate: 03-Feb-1932 Sex: male Admission Date (Current Location): 06/25/2019  [facility] and [place] Number:  Engineering geologist and Address:  [facility], [address] [place], [place] [address]      Provider Number: [id number]  Attending Physician Name and Address:  Edwin Dada, *  Relative Name and Phone Number:       Current Level of Care: Hospital Recommended Level of Care: Assisted Living Prior Approval Number:    Date Approved/Denied:   PASRR Number: [id number]  Discharge Plan: ALF    Current Diagnoses: Patient Active Problem List   Diagnosis Date Noted  . Pressure injury of skin 06/27/2019  . Acute hypoxemic respiratory failure due to COVID-19 ([place]) 06/25/2019  . DKA (diabetic ketoacidoses) ([place]) 06/25/2019  . Dementia without behavioral disturbance ([place]) 06/25/2019  . CKD (chronic kidney disease), stage IV ([place]) 06/25/2019  . Iron deficiency anemia 06/25/2019  . Hyponatremia 06/25/2019  . High anion gap metabolic acidosis 44/08/4740  . AKI (acute kidney injury) ([place]) 06/25/2019  . Hypertensive urgency 06/25/2019  . Severe sepsis ([place]) 06/25/2019  . Closed right hip fracture ([place]) 01/19/2019  . Hip fracture ([place]) 01/19/2019  . Left arm cellulitis 09/05/2018  . Lymphedema 07/27/2018  . Chronic venous insufficiency 07/27/2018  . Ulcers of both lower legs, limited to breakdown of skin ([place]) 06/29/2018  . Peripheral arterial disease ([place]) 05/29/2018  . DM2 (diabetes mellitus, type 2) ([place]) 04/04/2018  . HTN (hypertension) 04/04/2018  . History of colonic polyps 07/14/2015    Orientation RESPIRATION BLADDER Height & Weight     Place, Time, Self  O2(Nasal Cannula 3L) Incontinent Weight: 163 lb 9.6 oz (74.2 kg) Height:  5\' 8"  (172.7 cm)  BEHAVIORAL SYMPTOMS/MOOD NEUROLOGICAL BOWEL NUTRITION STATUS      Incontinent  Diet(carb modified)  AMBULATORY STATUS COMMUNICATION OF NEEDS Skin   Extensive Assist Verbally PU Stage and Appropriate Care(Pressure injury on buttocks, foam dressing, change PRN)                       Personal Care Assistance Level of Assistance  Bathing, Dressing, Feeding Bathing Assistance: Maximum assistance Feeding assistance: Independent Dressing Assistance: Maximum assistance     Functional Limitations Info  Sight, Hearing, Speech Sight Info: Adequate Hearing Info: Adequate Speech Info: Adequate    SPECIAL CARE FACTORS FREQUENCY  PT (By licensed PT), OT (By licensed OT)     PT Frequency: 2X OT Frequency: 2X            Contractures Contractures Info: Not present    Additional Factors Info  Code Status, Allergies, Isolation Precautions Code Status Info: DNR Allergies Info: Meperidine, Penicillins     Isolation Precautions Info: COVID +     Current Medications (06/28/2019):  This is the current hospital active medication list Current Facility-Administered Medications  Medication Dose Route Frequency Provider Last Rate Last Admin  . acetaminophen (TYLENOL) tablet 650 mg  650 mg Oral Q6H PRN Nicole Kindred A, DO      . amLODipine (NORVASC) tablet 10 mg  10 mg Oral Daily Nicole Kindred A, DO   10 mg at 06/28/19 1020  . ascorbic acid (VITAMIN C) tablet 500 mg  500 mg Oral Daily Nicole Kindred A, DO   500 mg at 06/27/19 2022  . aspirin EC tablet 81 mg  81 mg Oral Daily Nicole Kindred A, DO  81 mg at 06/27/19 2022  . azithromycin (ZITHROMAX) tablet 500 mg  500 mg Oral Daily Oswald Hillock, RPH      . bisacodyl (DULCOLAX) suppository 10 mg  10 mg Rectal Daily PRN Nicole Kindred A, DO      . calcitRIOL (ROCALTROL) capsule 0.25 mcg  0.25 mcg Oral Daily Danford, Suann Larry, MD   0.25 mcg at 06/28/19 1020  . cefTRIAXone (ROCEPHIN) 1 g in sodium chloride 0.9 % 100 mL IVPB  1 g Intravenous Daily Nicole Kindred A, DO 200 mL/hr at 06/28/19 0811 1 g at 06/28/19  0811  . chlorpheniramine-HYDROcodone (TUSSIONEX) 10-8 MG/5ML suspension 5 mL  5 mL Oral Q12H PRN Nicole Kindred A, DO      . dexamethasone (DECADRON) injection 6 mg  6 mg Intravenous Q24H Nicole Kindred A, DO   6 mg at 06/27/19 2023  . dextrose 50 % solution 0-50 mL  0-50 mL Intravenous PRN Blake Divine, MD      . dutasteride (AVODART) capsule 0.5 mg  0.5 mg Oral Daily Danford, Suann Larry, MD   0.5 mg at 06/28/19 1020  . feeding supplement (NEPRO CARB STEADY) liquid 237 mL  237 mL Oral TID WC Danford, Suann Larry, MD      . guaiFENesin-dextromethorphan (ROBITUSSIN DM) 100-10 MG/5ML syrup 10 mL  10 mL Oral Q4H PRN Nicole Kindred A, DO      . heparin injection 5,000 Units  5,000 Units Subcutaneous Q8H Danford, Suann Larry, MD   5,000 Units at 06/28/19 1237  . hydrALAZINE (APRESOLINE) injection 5 mg  5 mg Intravenous Q4H PRN Nicole Kindred A, DO      . insulin aspart (novoLOG) injection 0-20 Units  0-20 Units Subcutaneous TID WC Edwin Dada, MD   11 Units at 06/28/19 1236  . insulin aspart (novoLOG) injection 0-5 Units  0-5 Units Subcutaneous QHS Edwin Dada, MD   4 Units at 06/27/19 2043  . insulin aspart (novoLOG) injection 4 Units  4 Units Subcutaneous TID WC Edwin Dada, MD   4 Units at 06/28/19 1237  . insulin detemir (LEVEMIR) injection 15 Units  15 Units Subcutaneous BID Edwin Dada, MD   15 Units at 06/28/19 1018  . Ipratropium-Albuterol (COMBIVENT) respimat 1 puff  1 puff Inhalation Q6H Griffith, Kelly A, DO   1 puff at 06/28/19 1238  . labetalol (NORMODYNE) injection 10 mg  10 mg Intravenous Q2H PRN Nicole Kindred A, DO      . linagliptin (TRADJENTA) tablet 5 mg  5 mg Oral Daily Danford, Suann Larry, MD   5 mg at 06/28/19 1020  . magnesium citrate solution 1 Bottle  1 Bottle Oral Once PRN Nicole Kindred A, DO      . multivitamin with minerals tablet 1 tablet  1 tablet Oral Daily Nicole Kindred A, DO   1 tablet at 06/28/19 1020  .  ondansetron (ZOFRAN) tablet 4 mg  4 mg Oral Q6H PRN Nicole Kindred A, DO       Or  . ondansetron (ZOFRAN) injection 4 mg  4 mg Intravenous Q6H PRN Nicole Kindred A, DO      . polyethylene glycol (MIRALAX / GLYCOLAX) packet 17 g  17 g Oral Daily PRN Nicole Kindred A, DO      . remdesivir 100 mg in sodium chloride 0.9 % 100 mL IVPB  100 mg Intravenous Daily Oswald Hillock, RPH 200 mL/hr at 06/28/19 1024 100 mg at 06/28/19 1024  . sodium bicarbonate tablet  650 mg  650 mg Oral BID Nicole Kindred A, DO   650 mg at 06/28/19 1020  . tamsulosin (FLOMAX) capsule 0.4 mg  0.4 mg Oral QPC breakfast Edwin Dada, MD   0.4 mg at 06/28/19 0804  . triamcinolone ointment (KENALOG) 0.1 % 1 application  1 application Topical BID Nicole Kindred A, DO   1 application at 32/02/33 1018  . zinc sulfate capsule 220 mg  220 mg Oral Daily Nicole Kindred A, DO   220 mg at 06/27/19 2022     Discharge Medications: Please see discharge summary for a list of discharge medications.  Relevant Imaging Results:  Relevant Lab Results:   Additional Information IDH:[id number]  Eileen Stanford, LCSW

## 2019-06-28 NOTE — Plan of Care (Signed)
  Problem: Education: Goal: Knowledge of General Education information will improve Description: Including pain rating scale, medication(s)/side effects and non-pharmacologic comfort measures Outcome: Progressing   Problem: Health Behavior/Discharge Planning: Goal: Ability to manage health-related needs will improve Outcome: Not Progressing Note: Patient incontinent of stool. Also very weak, even with assist and walker. Patient also uses condom catheter for urinary incontinence. Will continue to monitor overall progression. Wenda Low [facility]

## 2019-06-28 NOTE — Progress Notes (Addendum)
Initial Nutrition Assessment  DOCUMENTATION CODES:   Not applicable  INTERVENTION:   Nepro Shake po TID, each supplement provides 425 kcal and 19 grams protein  MVI daily   Liberalize diet   NUTRITION DIAGNOSIS:   Increased nutrient needs related to catabolic illness(COVID 19) as evidenced by increased estimated needs.  GOAL:   Patient will meet greater than or equal to 90% of their needs  MONITOR:   PO intake, Supplement acceptance, Weight trends, Labs, Skin, I & O's  REASON FOR ASSESSMENT:   Consult Assessment of nutrition requirement/status  ASSESSMENT:   86 y.o. male with medical history significant of type 2 diabetes, hypertension, peripheral artery disease, mild dementia, CKD stage IV presented to the ED today from [facility] assisted living facility with sudden onset shortness of breath. Pt diagnosed with COVID 19 1/4   Unable to speak with patient via phone. Suspect pt with poor appetite and oral intake pta r/t COVID 19. Pt with poor appetite and oral intake in hospital. RD will add supplements and MVI to help patient meet his estimated needs. Per chart, pt's admit weight ~20lbs above his UBW; unsure if admit weight is correct.   Medications reviewed and include: vitamin C, aspirin, azithromycin, calcitriol, dexamethasone, heparin, insulin, MVI, Na bicarbonate, zinc, ceftriaxone    Labs reviewed: BUN 111(H), creat 4.32(H), Ca 7.3(L) adj. 8.42(L), Mg 2.5(H), alb 2.6(L) Wbc- 18.2(H), Hgb 9.6(L), Hct 27.6(L) cbgs- 333, 310, 54, 190, 263 x 24 hrs AIC 9.1(H)- 1/16  Unable to complete Nutrition-Focused physical exam at this time as pt with COVID 19.   Diet Order:   Diet Order            Diet Carb Modified Fluid consistency: Thin; Room service appropriate? No  Diet effective now             EDUCATION NEEDS:   Not appropriate for education at this time  Skin:  Skin Assessment: Reviewed RN Assessment(DTI buttocks- 9cm x 11cm x 0.1cm)  Last BM:  1/17- type  6  Height:   Ht Readings from Last 1 Encounters:  06/26/19 5\' 8"  (1.727 m)    Weight:   Wt Readings from Last 1 Encounters:  06/26/19 74.2 kg    Ideal Body Weight:  70 kg  BMI:  Body mass index is 24.88 kg/m.  Estimated Nutritional Needs:   Kcal:  2000-2300kcal/day  Protein:  95-110g/day  Fluid:  >1.9L/day  Koleen Distance MS, RD, LDN Pager #- [phone] Office#- [phone] After Hours Pager: [phone]

## 2019-06-28 NOTE — Progress Notes (Signed)
Patient had a massive BM while I was in room, cleaned up and returned to bed with the help of [place], [place]. Replaced sacral foam pad. Will continue to monitor. Wenda Low [facility]

## 2019-06-28 NOTE — Progress Notes (Signed)
Inpatient Diabetes Program Recommendations  AACE/ADA: New Consensus Statement on Inpatient Glycemic Control   Target Ranges:  Prepandial:   less than 140 mg/dL      Peak postprandial:   less than 180 mg/dL (1-2 hours)      Critically ill patients:  140 - 180 mg/dL   Results for EMERY, DUPUY (MRN [id number]) as of 06/28/2019 08:48  Ref. Range 06/27/2019 09:07 06/27/2019 09:08 06/27/2019 18:22 06/27/2019 20:31 06/28/2019 08:28  Glucose-Capillary Latest Ref Range: 70 - 99 mg/dL 343 (H)   314 (H)  Novolog 15 units  Levemir 15 units 333 (H)  Novolog 19 units 310 (H)  Novolog 4 units  Levemir 15 units 54 (L)   Review of Glycemic Control  Diabetes history: DM2 Outpatient Diabetes medications: Tradjenta 5 mg daily Current orders for Inpatient glycemic control: Levemir 15 units BID, Novolog 0-20 units TID with meals, Novolog 0-5 units QHS, Novolog 4 units TID with meals; Decadron 6 mg Q24H  Inpatient Diabetes Program Recommendations:   Insulin - Basal: Fasting glucose 54 mg/dl today. Please consider decreasing Levemir to 12 units BID.  Insulin - Meal Coverage: If steroids are continued and post prandial glucose is consistently greater than 180 mg/dl, please consider increasing meal coverage to Novolog 6 units TID with meals.  Thanks, Barnie Alderman, RN, MSN, CDE Diabetes Coordinator Inpatient Diabetes Program [phone] (Team Pager from 8am to 5pm)

## 2019-06-28 NOTE — Consult Note (Signed)
WOC Nurse Consult Note: Reason for Consult: pressure injury Wound type: Deep Tissue Pressure Injury Pressure Injury POA: Yes Measurement: 9cm x 11cm x 0.1cm  Wound bed: dark purple area, non blanchable with partial thickness skin loss in two areas  Drainage (amount, consistency, odor) serosanguinous  Periwound: intact  Dressing procedure/placement/frequency: Silicone foam dressing and monitor for evolution to eschar.  Feel certain this will be an eschar in a week or less.  Air mattress requested. Add RD for wound healing.    Discussed POC with patient and bedside nurse.  Re consult if needed, will not follow at this time. Thanks  Jaeson Molstad [employer], RN,CWOCN, CNS, [place] [phone])

## 2019-06-29 LAB — CBC WITH DIFFERENTIAL/PLATELET
Abs Immature Granulocytes: 0.6 10*3/uL — ABNORMAL HIGH (ref 0.00–0.07)
Basophils Absolute: 0 10*3/uL (ref 0.0–0.1)
Basophils Relative: 0 %
Eosinophils Absolute: 0 10*3/uL (ref 0.0–0.5)
Eosinophils Relative: 0 %
HCT: 28.7 % — ABNORMAL LOW (ref 39.0–52.0)
Hemoglobin: 9.8 g/dL — ABNORMAL LOW (ref 13.0–17.0)
Immature Granulocytes: 4 %
Lymphocytes Relative: 3 %
Lymphs Abs: 0.4 10*3/uL — ABNORMAL LOW (ref 0.7–4.0)
MCH: 30.2 pg (ref 26.0–34.0)
MCHC: 34.1 g/dL (ref 30.0–36.0)
MCV: 88.6 fL (ref 80.0–100.0)
Monocytes Absolute: 0.2 10*3/uL (ref 0.1–1.0)
Monocytes Relative: 2 %
Neutro Abs: 13 10*3/uL — ABNORMAL HIGH (ref 1.7–7.7)
Neutrophils Relative %: 91 %
Platelets: 251 10*3/uL (ref 150–400)
RBC: 3.24 MIL/uL — ABNORMAL LOW (ref 4.22–5.81)
RDW: 12.3 % (ref 11.5–15.5)
WBC: 14.2 10*3/uL — ABNORMAL HIGH (ref 4.0–10.5)
nRBC: 0.1 % (ref 0.0–0.2)

## 2019-06-29 LAB — COMPREHENSIVE METABOLIC PANEL
ALT: 45 U/L — ABNORMAL HIGH (ref 0–44)
AST: 55 U/L — ABNORMAL HIGH (ref 15–41)
Albumin: 2.8 g/dL — ABNORMAL LOW (ref 3.5–5.0)
Alkaline Phosphatase: 53 U/L (ref 38–126)
Anion gap: 15 (ref 5–15)
BUN: 100 mg/dL — ABNORMAL HIGH (ref 8–23)
CO2: 16 mmol/L — ABNORMAL LOW (ref 22–32)
Calcium: 7.4 mg/dL — ABNORMAL LOW (ref 8.9–10.3)
Chloride: 111 mmol/L (ref 98–111)
Creatinine, Ser: 3.87 mg/dL — ABNORMAL HIGH (ref 0.61–1.24)
GFR calc Af Amer: 15 mL/min — ABNORMAL LOW (ref >60)
GFR calc non Af Amer: 13 mL/min — ABNORMAL LOW (ref >60)
Glucose, Bld: 65 mg/dL — ABNORMAL LOW (ref 70–99)
Potassium: 4 mmol/L (ref 3.5–5.1)
Sodium: 142 mmol/L (ref 135–145)
Total Bilirubin: 0.8 mg/dL (ref 0.3–1.2)
Total Protein: 6 g/dL — ABNORMAL LOW (ref 6.5–8.1)

## 2019-06-29 LAB — FERRITIN: Ferritin: 540 ng/mL — ABNORMAL HIGH (ref 24–336)

## 2019-06-29 LAB — MAGNESIUM: Magnesium: 2.5 mg/dL — ABNORMAL HIGH (ref 1.7–2.4)

## 2019-06-29 LAB — C-REACTIVE PROTEIN: CRP: 2.7 mg/dL — ABNORMAL HIGH (ref <1.0)

## 2019-06-29 LAB — GLUCOSE, CAPILLARY
Glucose-Capillary: 56 mg/dL — ABNORMAL LOW (ref 70–99)
Glucose-Capillary: 125 mg/dL — ABNORMAL HIGH (ref 70–99)
Glucose-Capillary: 135 mg/dL — ABNORMAL HIGH (ref 70–99)
Glucose-Capillary: 131 mg/dL — ABNORMAL HIGH (ref 70–99)
Glucose-Capillary: 185 mg/dL — ABNORMAL HIGH (ref 70–99)

## 2019-06-29 LAB — FIBRIN DERIVATIVES D-DIMER (ARMC ONLY): Fibrin derivatives D-dimer (ARMC): 2909.95 ng/mL (FEU) — ABNORMAL HIGH (ref 0.00–499.00)

## 2019-06-29 MED ORDER — ENOXAPARIN SODIUM 30 MG/0.3ML ~~LOC~~ SOLN
30.0000 mg | SUBCUTANEOUS | Status: DC
  Administered 2019-06-29: 30 mg via SUBCUTANEOUS
  Filled 2019-06-29: qty 0.3

## 2019-06-29 MED ORDER — INSULIN DETEMIR 100 UNIT/ML ~~LOC~~ SOLN
15.0000 [IU] | Freq: Every day | SUBCUTANEOUS | Status: DC
  Administered 2019-06-29 – 2019-06-30 (×2): 15 [IU] via SUBCUTANEOUS
  Filled 2019-06-29 (×3): qty 0.15

## 2019-06-29 NOTE — TOC Initial Note (Addendum)
Transition of Care [facility]) - Initial/Assessment Note    Patient Details  Name: [name] MRN: [id number] Date of Birth: [birth year]  [facility] CM/SW Contact:    Eileen Stanford, LCSW Phone Number: 06/29/2019, 3:49 PM  Clinical Narrative:   CSW spoke with pt's daughter via telephone. Pt's daughter states she is [place]. Pt's daughter states pt is from [facility] and is receiving home health there. Pt's daughter states she has already spoken to [facility] and they are agreeable to taking him back. The family is going to hire someone so the pt will have 24 hour supervision. CSW will follow up with facility.       High Risk Readmission Screening completed. Pt resides at [facility] and receives all services there.          Expected Discharge Plan: Assisted Living Barriers to Discharge: Continued Medical Work up   Patient Goals and CMS Choice Patient states their goals for this hospitalization and ongoing recovery are:: to return to ALF- per pt's daughter      Expected Discharge Plan and Services Expected Discharge Plan: Assisted Living In-house Referral: NA   Post Acute Care Choice: [place] arrangements for the past 2 months: [place]                                      Prior Living Arrangements/Services Living arrangements for the past 2 months: [place] Lives with:: Self Patient language and need for interpreter reviewed:: Yes Do you feel safe going back to the place where you live?: Yes      Need for Family Participation in Patient Care: Yes (Comment) Care giver support system in place?: Yes (comment) Current home services: Home PT Criminal Activity/Legal Involvement Pertinent to Current Situation/Hospitalization: No - Comment as needed  Activities of Daily Living      Permission Sought/Granted Permission sought to share information with : Family Supports    Share Information  with NAME: [name]  Permission granted to share info w AGENCY: [facility]  Permission granted to share info w Relationship: daughter MPOA  Permission granted to share info w Contact Information: [phone]  Emotional Assessment Appearance:: Appears stated age Attitude/Demeanor/Rapport: Unable to Assess Affect (typically observed): Unable to Assess Orientation: : Oriented to Self, Oriented to Place, Oriented to  Time Alcohol / Substance Use: Not Applicable Psych Involvement: No (comment)  Admission diagnosis:  Respiratory distress [R06.03] SIRS (systemic inflammatory response syndrome) (HCC) [R65.10] Acute hypoxemic respiratory failure due to COVID-19 (HCC) [U07.1, J96.01] COVID-19 [U07.1] Patient Active Problem List   Diagnosis Date Noted  . Pressure injury of skin 06/27/2019  . Acute hypoxemic respiratory failure due to COVID-19 ([place]) 06/25/2019  . DKA (diabetic ketoacidoses) ([place]) 06/25/2019  . Dementia without behavioral disturbance ([place]) 06/25/2019  . CKD (chronic kidney disease), stage IV ([place]) 06/25/2019  . Iron deficiency anemia 06/25/2019  . Hyponatremia 06/25/2019  . High anion gap metabolic acidosis 36/14/4315  . AKI (acute kidney injury) ([place]) 06/25/2019  . Hypertensive urgency 06/25/2019  . Severe sepsis ([place]) 06/25/2019  . Closed right hip fracture ([place]) 01/19/2019  . Hip fracture ([place]) 01/19/2019  . Left arm cellulitis 09/05/2018  . Lymphedema 07/27/2018  . Chronic venous insufficiency 07/27/2018  . Ulcers of both lower legs, limited to breakdown of skin ([place]) 06/29/2018  . Peripheral arterial disease ([place]) 05/29/2018  .  DM2 (diabetes mellitus, type 2) ([place]) 04/04/2018  . HTN (hypertension) 04/04/2018  . History of colonic polyps 07/14/2015   PCP:  [name], MD Pharmacy:   [place], [place] [address] Phone: [phone] Fax: [place], [place]  [place] [place] [address] Phone: [phone] Fax: [phone]     Social Determinants of Health (SDOH) Interventions    Readmission Risk Interventions Readmission Risk Prevention Plan 01/21/2019  Transportation Screening Complete  PCP or Specialist Appt within 3-5 Days Complete  HRI or Home Care Consult Complete  Palliative Care Screening Not Applicable  Medication Review (RN Care Manager) Complete  Some recent data might be hidden

## 2019-06-29 NOTE — Progress Notes (Signed)
PROGRESS NOTE    BLESS BELSHE  GBT:[id number] DOB: [birth year] DOA: 06/25/2019 PCP: [name], MD      Brief Narrative:  Mr. [name] is a 86 y.o. M with dementia, lives in ALF at [place], [place], HTN, PVD and CKD IV baseline 3-3.8 who presented with SOB.    Patient had tested positive for COVID 1/4 (12 days PTA) but doing well.  On day of admission, was eating lunch with daughter through visitation glass when he appeared to be out of breath.  Facility staff found his SpO2 85% and so he was sent to ER.  In the ER, temp 101.70F, HR 120, RR 30.  Spo2 85%.  Lactic acid 3.8.  Na 126.  Glucose 852.  HCO3 15.  Anion gap 18.  Cr 5.33.  WBC 21K.  CXR showed bilateral opacities.  Started on antibiotics, remdesivir and steroids.            Assessment & Plan:  Coronavirus pneumonia with acute hypoxic respiratory failure Pt presents with SPO2 85%, respiratory rate 30, and bilateral opacities on chest x-ray in the setting of the ongoing 2020-2021 over 19 pandemic  Was weaned from 50 L supplemental oxygen to room air overnight on admission, suggesting that his previous oxygen requirement was spurious.  He was not diuresed, so this acute hypoxia could not be from flash pulmonary edema.  It also is unlikely that PE would cause a rapidly resolving hypoxia of the degree, and this was ruled out with nuclear perfusion scanning.  O2 needs down to 1-2L. Remains afebrile, laboratory markers elevated but improving.  -Continue remdesivir day 5 -Continue dexamethasone day 5 -Continue zinc and vitamin C -Continue Combivent -Consult nutrition     Possible sepsis from acute bacterial pneumonia  Presented with leukocytosis, tachycardia, fever, tachypnea, elevated lactate, and possible superimposed bacterial pneumonia.  Fevers normalized. -Continue ceftriaxone and azithromycin, day 5, complete therapy    Dementia with acute metabolic encephalopathy Remains moderately encephalopathic -PT  eval -Nutrition consult  Diabetes in DKA Presented with glucose 852, anion gap 18, bicarb 15.  Started on IV fluids and insulin drip.  Gap closed overnight night of admission.  Glucoses now somewhat low -Continue linagliptin -Continue Levemir erduce to once daily -Continue mealtime insulin -Continue SS correction insulin  Acute on chronic renal failure Metabolic acidosis Chronic kidney disease stage IV Baseline creatinine 3-3.8.  Minimal creatinine 5.33, came down to 4.8 with fluids, today down to <4 mg/dL. -Continue oral bicarb -Hold fluids -Trend BMP -Hold diuretics and losartan for now -Continue calcitriol    Peripheral vascular disease, secondary prevention Hypertension Blood pressure stable, normal -Continue aspirin -Hold amlodipine, losartan   Anemia of chronic kidney disease -Continue iron  BPH -Continue Flomax, dutasteride   Stage II pressure injury, sacrum, POA Moderate protein calorie malnutrition  As evidenced by diffuse loss of subcutaneous muscle mass and fat, advanced age, COVID, and pre-existing pressure wound. -WOC consult -Nutrition consult           Disposition: The patient was admitted with COVID-19 and acute hypoxic respiratory failure and also acute on chronic kidney disease.    He remains on supplemental O2 and requires ongoing IV remdesivir.    At this time, the patient is completed 5 days of remdesivir. I will continue dexamethasone up to 10 days, and continue him on supplemental oxygen. His encephalopathy is likely to persist for many weeks. However if his oxygen requirements wean off or stabilized around 1 to 2 L, we will consider  discharge back to skilled nursing later this week with Covid precautions, close attention to nutrition and fluid intake.        MDM: The below labs and imaging reports reviewed and summarized above.  Medication management as above.        DVT prophylaxis: Low-dose Lovenox Code Status: DO NOT  RESUSCITATE Family Communication: Daughter by phone    Consultants:   Nephrology  Procedures:     Antimicrobials:      Culture data:            Subjective: Patient is sleepy. No desaturations today, no respiratory distress, able to wean down on oxygen, still encephalopathic.     Objective: Vitals:   06/28/19 2011 06/29/19 0427 06/29/19 0810 06/29/19 1650  BP: (!) 139/58 124/68 125/68 (!) 128/55  Pulse: 73 69 70 73  Resp: 20 20 19 18   Temp: (!) 97.4 F (36.3 C) 97.8 F (36.6 C) 97.7 F (36.5 C) 97.9 F (36.6 C)  TempSrc: Oral Oral Oral   SpO2: 93% 95% 96% 97%  Weight:      Height:        Intake/Output Summary (Last 24 hours) at 06/29/2019 1756 Last data filed at 06/29/2019 1500 Gross per 24 hour  Intake 240 ml  Output 160 ml  Net 80 ml   Filed Weights   06/25/19 1425 06/26/19 2216  Weight: 64.4 kg 74.2 kg    Examination: General appearance: Elderly adult male, awake, appears tired.   HEENT: Anicteric, conjunctiva pink, lids and lashes normal. No nasal deformity, discharge, epistaxis.  Lips moist, teeth normal. OP tacky dry, no oral lesions.  Hearing diminished. Skin: Warm and dry.  No suspicious rashes or lesions. Cardiac: RRR, no murmurs appreciated.  No LE edema.    Respiratory: Tachypnea, shallow, lung sounds diminished, no rales Abdomen: Abdomen soft.  No tenderness palpation or guarding. No ascites, distension, hepatosplenomegaly.   MSK: No deformities or effusions of the large joints of the upper or lower extremities bilaterally. Neuro: Awake but somewhat listless.  Mostly inattentive.  Feebly moves his arms bilaterally and attempt to follow my commands, generalized weakness, speech fluent but hypophonic.  Psych: Inattentive, affect blunted, judgment insight appear impaired by delirium    Data Reviewed: I have personally reviewed following labs and imaging studies:  CBC: Recent Labs  Lab 06/25/19 1530 06/26/19 0343 06/27/19 0407  06/28/19 0358 06/29/19 0527  WBC 21.4* 17.1* 21.9* 18.2* 14.2*  NEUTROABS 20.0* 16.0* 20.7* 17.1* 13.0*  HGB 10.4* 9.7* 9.4* 9.6* 9.8*  HCT 31.8* 29.2* 28.1* 27.6* 28.7*  MCV 94.1 90.1 90.9 87.9 88.6  PLT 261 227 236 260 010   Basic Metabolic Panel: Recent Labs  Lab 06/26/19 0136 06/26/19 0343 06/27/19 0407 06/28/19 0358 06/29/19 0527  NA 135 133* 136 143 142  K 4.4 4.0 4.3 3.9 4.0  CL 107 106 109 114* 111  CO2 16* 14* 12* 17* 16*  GLUCOSE 164* 188* 315* 56* 65*  BUN 101* 95* 113* 111* 100*  CREATININE 4.87* 4.82* 4.54* 4.32* 3.87*  CALCIUM 8.0* 7.9* 7.3* 7.3* 7.4*  MG 2.1 2.2 2.3 2.5* 2.5*   GFR: Estimated Creatinine Clearance: 13.5 mL/min (A) (by C-G formula based on SCr of 3.87 mg/dL (H)). Liver Function Tests: Recent Labs  Lab 06/25/19 1530 06/26/19 0343 06/27/19 0407 06/28/19 0358 06/29/19 0527  AST 34 46* 64* 43* 55*  ALT 24 27 38 35 45*  ALKPHOS 53 43 44 45 53  BILITOT 1.1 0.7 0.9 0.6 0.8  PROT 6.7 6.4* 5.5* 5.6* 6.0*  ALBUMIN 3.3* 2.8* 2.8* 2.6* 2.8*   No results for input(s): LIPASE, AMYLASE in the last 168 hours. No results for input(s): AMMONIA in the last 168 hours. Coagulation Profile: Recent Labs  Lab 06/25/19 1530  INR 1.2   Cardiac Enzymes: No results for input(s): CKTOTAL, CKMB, CKMBINDEX, TROPONINI in the last 168 hours. BNP (last 3 results) No results for input(s): PROBNP in the last 8760 hours. HbA1C: No results for input(s): HGBA1C in the last 72 hours. CBG: Recent Labs  Lab 06/28/19 2215 06/29/19 0811 06/29/19 0915 06/29/19 1159 06/29/19 1652  GLUCAP 142* 56* 125* 135* 131*   Lipid Profile: No results for input(s): CHOL, HDL, LDLCALC, TRIG, CHOLHDL, LDLDIRECT in the last 72 hours. Thyroid Function Tests: No results for input(s): TSH, T4TOTAL, FREET4, T3FREE, THYROIDAB in the last 72 hours. Anemia Panel: Recent Labs    06/28/19 0358 06/29/19 0527  FERRITIN 649* 540*   Urine analysis:    Component Value Date/Time    COLORURINE STRAW (A) 06/25/2019 1907   APPEARANCEUR HAZY (A) 06/25/2019 1907   LABSPEC 1.012 06/25/2019 1907   PHURINE 5.0 06/25/2019 1907   GLUCOSEU >=500 (A) 06/25/2019 1907   HGBUR SMALL (A) 06/25/2019 1907   BILIRUBINUR NEGATIVE 06/25/2019 [place] NEGATIVE 06/25/2019 1907   PROTEINUR 30 (A) 06/25/2019 1907   NITRITE NEGATIVE 06/25/2019 1907   LEUKOCYTESUR NEGATIVE 06/25/2019 1907   Sepsis Labs: @LABRCNTIP (procalcitonin:4,lacticacidven:4)  ) Recent Results (from the past 240 hour(s))  Blood Culture (routine x 2)     Status: Abnormal   Collection Time: 06/25/19  1:54 PM   Specimen: BLOOD  Result Value Ref Range Status   Specimen Description   Final    BLOOD LEFT ANTECUBITAL Performed at [facility], [address] [place], [place] [address]    Special Requests   Final    BOTTLES DRAWN AEROBIC AND ANAEROBIC Blood Culture results may not be optimal due to an inadequate volume of blood received in culture bottles Performed at [facility], [address], [place], [place] [address]    Culture  Setup Time   Final    GRAM POSITIVE COCCI AEROBIC BOTTLE ONLY CRITICAL RESULT CALLED TO, READ BACK BY AND VERIFIED WITH: DAVID BESANTI AT 6606 06/26/19.PMF Performed at [facility], [address] [place], [place] [address]    Culture (A)  Final    STAPHYLOCOCCUS SPECIES (COAGULASE NEGATIVE) THE SIGNIFICANCE OF ISOLATING THIS ORGANISM FROM A SINGLE SET OF BLOOD CULTURES WHEN MULTIPLE SETS ARE DRAWN IS UNCERTAIN. PLEASE NOTIFY THE MICROBIOLOGY DEPARTMENT WITHIN ONE WEEK IF SPECIATION AND SENSITIVITIES ARE REQUIRED. Performed at [facility], [place] [address] [place], [place] [address]    Report Status 06/27/2019 FINAL  Final  Blood Culture (routine x 2)     Status: None (Preliminary result)   Collection Time: 06/25/19  1:54 PM   Specimen: BLOOD  Result Value Ref Range Status   Specimen Description BLOOD BLOOD RIGHT FOREARM  Final    Special Requests   Final    BOTTLES DRAWN AEROBIC AND ANAEROBIC Blood Culture adequate volume   Culture   Final    NO GROWTH 4 DAYS Performed at [facility], [address], [place],  [address]    Report Status PENDING  Incomplete  Urine culture     Status: Abnormal   Collection Time: 06/25/19  7:07 PM   Specimen: Urine, Random  Result Value Ref Range Status  Specimen Description   Final    URINE, RANDOM Performed at [facility], [address], [place], [place] [address]    Special Requests   Final    NONE Performed at [facility], [address] [place], [place] [address]    Culture (A)  Final    <10,000 COLONIES/mL INSIGNIFICANT GROWTH Performed at [facility] [address], [place],  [address]    Report Status 06/27/2019 FINAL  Final         Radiology Studies: No results found.      Scheduled Meds: . amLODipine  10 mg Oral Daily  . vitamin C  500 mg Oral Daily  . aspirin EC  81 mg Oral Daily  . azithromycin  500 mg Oral Daily  . calcitRIOL  0.25 mcg Oral Daily  . dexamethasone (DECADRON) injection  6 mg Intravenous Q24H  . dutasteride  0.5 mg Oral Daily  . enoxaparin (LOVENOX) injection  30 mg Subcutaneous Q24H  . feeding supplement (NEPRO CARB STEADY)  237 mL Oral TID WC  . insulin aspart  0-20 Units Subcutaneous TID WC  . insulin aspart  0-5 Units Subcutaneous QHS  . insulin aspart  4 Units Subcutaneous TID WC  . insulin detemir  15 Units Subcutaneous Daily  . Ipratropium-Albuterol  1 puff Inhalation Q6H  . linagliptin  5 mg Oral Daily  . multivitamin with minerals  1 tablet Oral Daily  . sodium bicarbonate  650 mg Oral BID  . tamsulosin  0.4 mg Oral QPC breakfast  . triamcinolone ointment  1 application Topical BID  . zinc sulfate  220 mg Oral Daily   Continuous Infusions: . cefTRIAXone (ROCEPHIN)  IV 1 g (06/29/19 1215)     LOS: 4 days    Time spent: 25 minutes  minutes    Edwin Dada, MD [facility] 06/29/2019, 5:56 PM     Please page though [place] or Epic secure chat:  For [employer], Adult nurse

## 2019-06-30 DIAGNOSIS — R6510 Systemic inflammatory response syndrome (SIRS) of non-infectious origin without acute organ dysfunction: Secondary | ICD-10-CM

## 2019-06-30 DIAGNOSIS — R0603 Acute respiratory distress: Secondary | ICD-10-CM

## 2019-06-30 LAB — COMPREHENSIVE METABOLIC PANEL
ALT: 44 U/L (ref 0–44)
AST: 47 U/L — ABNORMAL HIGH (ref 15–41)
Albumin: 2.7 g/dL — ABNORMAL LOW (ref 3.5–5.0)
Alkaline Phosphatase: 52 U/L (ref 38–126)
Anion gap: 11 (ref 5–15)
BUN: 98 mg/dL — ABNORMAL HIGH (ref 8–23)
CO2: 17 mmol/L — ABNORMAL LOW (ref 22–32)
Calcium: 7.5 mg/dL — ABNORMAL LOW (ref 8.9–10.3)
Chloride: 112 mmol/L — ABNORMAL HIGH (ref 98–111)
Creatinine, Ser: 3.49 mg/dL — ABNORMAL HIGH (ref 0.61–1.24)
GFR calc Af Amer: 17 mL/min — ABNORMAL LOW (ref >60)
GFR calc non Af Amer: 15 mL/min — ABNORMAL LOW (ref >60)
Glucose, Bld: 76 mg/dL (ref 70–99)
Potassium: 4.2 mmol/L (ref 3.5–5.1)
Sodium: 140 mmol/L (ref 135–145)
Total Bilirubin: 0.5 mg/dL (ref 0.3–1.2)
Total Protein: 5.7 g/dL — ABNORMAL LOW (ref 6.5–8.1)

## 2019-06-30 LAB — CBC WITH DIFFERENTIAL/PLATELET
Abs Immature Granulocytes: 0.94 10*3/uL — ABNORMAL HIGH (ref 0.00–0.07)
Basophils Absolute: 0.1 10*3/uL (ref 0.0–0.1)
Basophils Relative: 0 %
Eosinophils Absolute: 0 10*3/uL (ref 0.0–0.5)
Eosinophils Relative: 0 %
HCT: 28.2 % — ABNORMAL LOW (ref 39.0–52.0)
Hemoglobin: 9.5 g/dL — ABNORMAL LOW (ref 13.0–17.0)
Immature Granulocytes: 7 %
Lymphocytes Relative: 4 %
Lymphs Abs: 0.5 10*3/uL — ABNORMAL LOW (ref 0.7–4.0)
MCH: 30.2 pg (ref 26.0–34.0)
MCHC: 33.7 g/dL (ref 30.0–36.0)
MCV: 89.5 fL (ref 80.0–100.0)
Monocytes Absolute: 0.6 10*3/uL (ref 0.1–1.0)
Monocytes Relative: 4 %
Neutro Abs: 12.3 10*3/uL — ABNORMAL HIGH (ref 1.7–7.7)
Neutrophils Relative %: 85 %
Platelets: 245 10*3/uL (ref 150–400)
RBC: 3.15 MIL/uL — ABNORMAL LOW (ref 4.22–5.81)
RDW: 12.6 % (ref 11.5–15.5)
Smear Review: NORMAL
WBC: 14.4 10*3/uL — ABNORMAL HIGH (ref 4.0–10.5)
nRBC: 0.5 % — ABNORMAL HIGH (ref 0.0–0.2)

## 2019-06-30 LAB — CULTURE, BLOOD (ROUTINE X 2)
Culture: NO GROWTH
Special Requests: ADEQUATE

## 2019-06-30 LAB — C-REACTIVE PROTEIN: CRP: 1.6 mg/dL — ABNORMAL HIGH (ref <1.0)

## 2019-06-30 LAB — GLUCOSE, CAPILLARY
Glucose-Capillary: 95 mg/dL (ref 70–99)
Glucose-Capillary: 145 mg/dL — ABNORMAL HIGH (ref 70–99)

## 2019-06-30 LAB — FIBRIN DERIVATIVES D-DIMER (ARMC ONLY): Fibrin derivatives D-dimer (ARMC): 2702.56 ng/mL (FEU) — ABNORMAL HIGH (ref 0.00–499.00)

## 2019-06-30 LAB — FERRITIN: Ferritin: 396 ng/mL — ABNORMAL HIGH (ref 24–336)

## 2019-06-30 LAB — MAGNESIUM: Magnesium: 2.5 mg/dL — ABNORMAL HIGH (ref 1.7–2.4)

## 2019-06-30 MED ORDER — NEPRO/CARBSTEADY PO LIQD: 237.0000 mL | Freq: Three times a day (TID) | ORAL | 30 refills | Status: AC

## 2019-06-30 MED ORDER — PREDNISONE 10 MG (21) PO TBPK: ORAL_TABLET | ORAL | 0 refills | Status: DC

## 2019-06-30 NOTE — Progress Notes (Signed)
O2 sat 94% on room air. Pt resting in bed with no acute distress.

## 2019-06-30 NOTE — Progress Notes (Signed)
Beth, RN will help patient for discharge.

## 2019-06-30 NOTE — Care Management Important Message (Signed)
Important Message  Patient Details  Name: [name] MRN: [id number] Date of Birth: [birth year]   Medicare Important Message Given:  Yes  Reviewed verbally with Ronie Spies, daughter, at [phone].  Confirmed copy of Medicare IM sent to patient's home address.     Dannette Barbara 06/30/2019, 11:47 AM

## 2019-06-30 NOTE — Progress Notes (Signed)
Pt escorted to vehicle via wheelchair. Discharge instructions , personal belongings including glasses given to [facility].

## 2019-06-30 NOTE — TOC Progression Note (Signed)
Transition of Care [facility]) - Progression Note    Patient Details  Name: [name] MRN: [id number] Date of Birth: 02/08/1932  Transition of Care [facility]) CM/SW Contact  Eileen Stanford, LCSW Phone Number: 06/30/2019, 9:45 AM  Clinical Narrative:  CSW confirmed with Kandis Mannan that pt can return when medically stable. CSW will continue to follow up with ALF.     Expected Discharge Plan: Assisted Living Barriers to Discharge: Continued Medical Work up  Expected Discharge Plan and Services Expected Discharge Plan: Assisted Living In-house Referral: NA   Post Acute Care Choice: [place] arrangements for the past 2 months: [place]                                       Social Determinants of Health (SDOH) Interventions    Readmission Risk Interventions Readmission Risk Prevention Plan 01/21/2019  Transportation Screening Complete  PCP or Specialist Appt within 3-5 Days Complete  HRI or [place] Complete  Palliative Care Screening Not Applicable  Medication Review (RN Care Manager) Complete  Some recent data might be hidden

## 2019-06-30 NOTE — TOC Transition Note (Addendum)
Transition of Care [facility]) - CM/SW Discharge Note   Patient Details  Name: [name] MRN: [id number] Date of Birth: 06-Jul-1931  Transition of [facility]) CM/SW Contact:  Eileen Stanford, LCSW Phone Number: 06/30/2019, 12:27 PM   Clinical Narrative:   Pt is returning to [facility] today. Facility has been notified. Pt's daughter in law [name] has been notified and will transport pt. Pt's daughter states she will be unable to pick pt up until two more hours. Pt will resume HH PT and SN with [facility]. [facility] aware of pt d/c today.    Final next level of care: Assisted Living Barriers to Discharge: No Barriers Identified   Patient Goals and CMS Choice Patient states their goals for this hospitalization and ongoing recovery are:: to return to ALF- per pt's daughter      Discharge Placement              Patient chooses bed at: [facility]) Patient to be transferred to facility by: Daughter will transport Name of family member notified: [name] Patient and family notified of of transfer: 06/30/19  Discharge Plan and Services In-house Referral: NA   Post Acute Care Choice: Home Health                               Social Determinants of Health (SDOH) Interventions     Readmission Risk Interventions Readmission Risk Prevention Plan 06/30/2019 01/21/2019  Transportation Screening Complete Complete  PCP or Specialist Appt within 3-5 Days Complete Complete  HRI or Home Care Consult Complete Complete  Social Work Consult for [place] Planning/Counseling Complete -  Palliative Care Screening Not Applicable Not Applicable  Medication Review [employer]) Complete Complete  Some recent data might be hidden

## 2019-06-30 NOTE — Progress Notes (Signed)
Attempted twice to call report to [facility]. Staff aware that he will be returning back this afternoon.

## 2019-06-30 NOTE — Discharge Summary (Signed)
[facility] NAME: [name]    MR#:  [id number]  DATE OF BIRTH:  03-Feb-1932  DATE OF ADMISSION:  06/25/2019   ADMITTING PHYSICIAN: Ezekiel Slocumb, DO  DATE OF DISCHARGE: 06/30/2019  PRIMARY CARE PHYSICIAN: [name], MD   ADMISSION DIAGNOSIS:  Respiratory distress [R06.03] SIRS (systemic inflammatory response syndrome) (HCC) [R65.10] Acute hypoxemic respiratory failure due to COVID-19 ([place]) [U07.1, J96.01] COVID-19 [U07.1] DISCHARGE DIAGNOSIS:  Principal Problem:   Acute hypoxemic respiratory failure due to COVID-19 [facility]) Active Problems:   DM2 (diabetes mellitus, type 2) (HCC)   HTN (hypertension)   Peripheral arterial disease (HCC)   Chronic venous insufficiency   DKA (diabetic ketoacidoses) ([place])   Dementia without behavioral disturbance ([place])   CKD (chronic kidney disease), stage IV (HCC)   Iron deficiency anemia   Hyponatremia   High anion gap metabolic acidosis   AKI (acute kidney injury) ([place])   Hypertensive urgency   Severe sepsis (HCC)   Pressure injury of skin  SECONDARY DIAGNOSIS:   Past Medical History:  Diagnosis Date  . Cancer ([place])   . Chronic kidney disease   . Colon polyp   . Diabetes mellitus without complication ([place])   . Heart murmur 2008  . Hyperlipidemia   . Hypertension 1980  . Retinal detachment    HOSPITAL COURSE:  [name] is a 86 y.o. M with dementia, lives in ALF at [place], [place], HTN, PVD and CKD IV baseline 3-3.8 who presented with SOB.    Patient had tested positive for COVID on 1/4 (16 days PTA) and doing well.  On day of admission, was eating lunch with daughter through visitation glass when he appeared to be out of breath.  Facility staff found his SpO2 85% and so he was sent to ER.  In the ER, temp 101.39F, HR 120, RR 30.  Spo2 85%.  Lactic acid 3.8.  Na 126.  Glucose 852.  HCO3 15.  Anion gap 18.  Cr 5.33.  WBC 21K.  CXR showed bilateral opacities.  Started on antibiotics,  remdesivir and steroids.   Coronavirus pneumonia with acute hypoxic respiratory failure Pt presents with SPO2 85%, respiratory rate 30, and bilateral opacities on chest x-ray in the setting of the ongoing 2020-2021 over 19 pandemic  Was weaned from 50 L supplemental oxygen to room air overnight on admission, suggesting that his previous oxygen requirement was spurious.  He was not diuresed, so this acute hypoxia could not be from flash pulmonary edema.  It also is unlikely that PE would cause a rapidly resolving hypoxia of the degree, and this was ruled out with nuclear perfusion scanning.  He remains on RA. Remains afebrile, laboratory markers elevated but improving.  -Finished remdesivir day 5 -Continue prednisone taper at D/C -Continue zinc and vitamin C  sepsis from acute bacterial pneumonia - present on admission Presented with leukocytosis, tachycardia, fever, tachypnea, elevated lactate, and possible superimposed bacterial pneumonia. - remains afebrile. Treated with ceftriaxone and azithromycin for 5 days  Dementia with acute metabolic encephalopathy Mental status close to baseline now.  Diabetes in DKA Treated   Acute renal failure Metabolic acidosis Chronic kidney disease stage IV Baseline creatinine 3.8. close to baseline now. Creat 3.4 on the day of D/C  Peripheral vascular disease, secondary prevention Hypertension   Anemia of chronic kidney disease -Continue iron  BPH -Continue Flomax, dutasteride   Stage II pressure injury, sacrum, POA Moderate protein calorie malnutrition  As evidenced by diffuse  loss of subcutaneous muscle mass and fat, advanced age, COVID, and pre-existing pressure wound.  Overall poor prognosis. DNR DISCHARGE CONDITIONS:  fair CONSULTS OBTAINED:   DRUG ALLERGIES:   Allergies  Allergen Reactions  . Meperidine Other (See Comments)    Severe bradycardia Severe bradycardia   . Penicillins Rash   DISCHARGE MEDICATIONS:    Allergies as of 06/30/2019      Reactions   Meperidine Other (See Comments)   Severe bradycardia Severe bradycardia   Penicillins Rash      Medication List    TAKE these medications   acetaminophen 500 MG tablet Commonly known as: TYLENOL Take 500 mg by mouth every 12 (twelve) hours as needed for mild pain.   amLODipine 10 MG tablet Commonly known as: NORVASC Take 10 mg by mouth daily.   ascorbic acid 500 MG tablet Commonly known as: VITAMIN C Take 500 mg by mouth 2 (two) times daily.   aspirin 81 MG EC tablet Take 1 tablet (81 mg total) by mouth daily.   B-Complex Tabs Take 1 tablet by mouth daily.   calcitRIOL 0.25 MCG capsule Commonly known as: ROCALTROL Take 0.25 mcg by mouth daily.   calcium carbonate 1500 (600 Ca) MG Tabs tablet Commonly known as: OSCAL Take 600 mg of elemental calcium by mouth 2 (two) times daily.   denosumab 60 MG/ML Sosy injection Commonly known as: PROLIA Inject 60 mg into the skin every 6 (six) months.   feeding supplement (NEPRO CARB STEADY) Liqd Take 237 mLs by mouth 3 (three) times daily with meals.   ferrous sulfate 325 (65 FE) MG tablet Take 325 mg by mouth daily with breakfast.   furosemide 20 MG tablet Commonly known as: LASIX Take 10-40 mg by mouth 2 (two) times daily. Take 2 tablets (40mg ) by mouth every morning and take  tablet (10mg ) by mouth at lunchtime   Jalyn 0.5-0.4 MG Caps Generic drug: Dutasteride-Tamsulosin HCl Take 1 capsule by mouth daily.   losartan 25 MG tablet Commonly known as: COZAAR Take 0.5 tablets (12.5 mg total) by mouth 2 (two) times daily.   multivitamin capsule Take 1 capsule by mouth daily.   potassium chloride 10 MEQ tablet Commonly known as: KLOR-CON Take 10 mEq by mouth daily.   predniSONE 10 MG (21) Tbpk tablet Commonly known as: STERAPRED UNI-PAK 21 TAB Start 60 mg po daily, taper 10 mg daily until done   sodium bicarbonate 650 MG tablet Take 650 mg by mouth 2 (two) times  daily.   Tradjenta 5 MG Tabs tablet Generic drug: linagliptin Take 5 mg by mouth daily.   triamcinolone ointment 0.1 % Commonly known as: KENALOG Apply 1 application topically 2 (two) times daily. (apply to arms and legs)   Vitamin D 50 MCG (2000 UT) tablet Take 2,000 Units by mouth daily.   zinc gluconate 50 MG tablet Take 100 mg by mouth daily.      DISCHARGE INSTRUCTIONS:   DIET:  Regular diet DISCHARGE CONDITION:  Fair ACTIVITY:  Activity as tolerated OXYGEN:  Home Oxygen: No.  Oxygen Delivery: room air DISCHARGE LOCATION:  [facility]  If you experience worsening of your admission symptoms, develop shortness of breath, life threatening emergency, suicidal or homicidal thoughts you must seek medical attention immediately by calling 911 or calling your MD immediately  if symptoms less severe.  You Must read complete instructions/literature along with all the possible adverse reactions/side effects for all the Medicines you take and that have been  prescribed to you. Take any new Medicines after you have completely understood and accpet all the possible adverse reactions/side effects.   Please note  You were cared for by a hospitalist during your hospital stay. If you have any questions about your discharge medications or the care you received while you were in the hospital after you are discharged, you can call the unit and asked to speak with the hospitalist on call if the hospitalist that took care of you is not available. Once you are discharged, your primary care physician will handle any further medical issues. Please note that NO REFILLS for any discharge medications will be authorized once you are discharged, as it is imperative that you return to your primary care physician (or establish a relationship with a primary care physician if you do not have one) for your aftercare needs so that they can reassess your need for medications and monitor  your lab values.    On the day of Discharge:  VITAL SIGNS:  Blood pressure (!) 141/89, pulse 69, temperature 97.8 F (36.6 C), resp. rate 15, height 5\' 8"  (1.727 m), weight 74.2 kg, SpO2 94 %. PHYSICAL EXAMINATION:  GENERAL:  86 y.o.-year-old patient lying in the bed with no acute distress.  General appearance: Elderly adult male, awake, appears tired.   HEENT: Anicteric, conjunctiva pink, lids and lashes normal. No nasal deformity, discharge, epistaxis.  Lips moist, teeth normal. OP tacky dry, no oral lesions.  Hearing diminished. Skin: Warm and dry.  No suspicious rashes or lesions. Cardiac: RRR, no murmurs appreciated.  No LE edema.    Respiratory: Tachypnea, shallow, lung sounds diminished, no rales Abdomen: Abdomen soft.  No tenderness palpation or guarding. No ascites, distension, hepatosplenomegaly.   MSK: No deformities or effusions of the large joints of the upper or lower extremities bilaterally. Neuro: Awake but somewhat listless.  Mostly inattentive.  Feebly moves his arms bilaterally and attempt to follow my commands, generalized weakness, speech fluent but hypophonic.  Psych: Inattentive, affect blunted, judgment insight appear impaired by delirium DATA REVIEW:   CBC Recent Labs  Lab 06/30/19 0410  WBC 14.4*  HGB 9.5*  HCT 28.2*  PLT 245    Chemistries  Recent Labs  Lab 06/30/19 0410  NA 140  K 4.2  CL 112*  CO2 17*  GLUCOSE 76  BUN 98*  CREATININE 3.49*  CALCIUM 7.5*  MG 2.5*  AST 47*  ALT 44  ALKPHOS 52  BILITOT 0.5     Follow-up Information    [name], MD. Schedule an appointment as soon as possible for a visit in 1 week(s).   Specialty: Family Medicine Contact information: [address] [place] [address] [phone]           Management plans discussed with the patient, family and they are in agreement.  CODE STATUS: DNR   TOTAL TIME TAKING CARE OF THIS PATIENT: 45 minutes.    Max Sane M.D on  06/30/2019 at 2:00 PM  [facility]   CC: Primary care physician; [name], MD   Note: This dictation was prepared with Dragon dictation along with smaller phrase technology. Any transcriptional errors that result from this process are unintentional.

## 2019-07-01 ENCOUNTER — Encounter: Payer: Self-pay | Admitting: Internal Medicine

## 2019-07-01 ENCOUNTER — Inpatient Hospital Stay
Admission: EM | Admit: 2019-07-01 | Discharge: 2019-07-05 | DRG: 637 | Disposition: A | Discharge status: Nursing Home (Includes ICF) | Payer: Medicare Other | Attending: Internal Medicine | Admitting: Internal Medicine

## 2019-07-01 ENCOUNTER — Other Ambulatory Visit: Payer: Self-pay

## 2019-07-01 DIAGNOSIS — K5900 Constipation, unspecified: Secondary | ICD-10-CM | POA: Diagnosis present

## 2019-07-01 DIAGNOSIS — Z833 Family history of diabetes mellitus: Secondary | ICD-10-CM

## 2019-07-01 DIAGNOSIS — Z8616 Personal history of COVID-19: Secondary | ICD-10-CM | POA: Diagnosis present

## 2019-07-01 DIAGNOSIS — E1165 Type 2 diabetes mellitus with hyperglycemia: Secondary | ICD-10-CM | POA: Diagnosis not present

## 2019-07-01 DIAGNOSIS — L89312 Pressure ulcer of right buttock, stage 2: Secondary | ICD-10-CM | POA: Diagnosis present

## 2019-07-01 DIAGNOSIS — I872 Venous insufficiency (chronic) (peripheral): Secondary | ICD-10-CM

## 2019-07-01 DIAGNOSIS — J1282 Pneumonia due to coronavirus disease 2019: Secondary | ICD-10-CM | POA: Diagnosis present

## 2019-07-01 DIAGNOSIS — I10 Essential (primary) hypertension: Secondary | ICD-10-CM | POA: Diagnosis present

## 2019-07-01 DIAGNOSIS — Z880 Allergy status to penicillin: Secondary | ICD-10-CM

## 2019-07-01 DIAGNOSIS — T380X5A Adverse effect of glucocorticoids and synthetic analogues, initial encounter: Secondary | ICD-10-CM | POA: Diagnosis present

## 2019-07-01 DIAGNOSIS — Z7982 Long term (current) use of aspirin: Secondary | ICD-10-CM

## 2019-07-01 DIAGNOSIS — Z888 Allergy status to other drugs, medicaments and biological substances status: Secondary | ICD-10-CM

## 2019-07-01 DIAGNOSIS — R739 Hyperglycemia, unspecified: Secondary | ICD-10-CM | POA: Diagnosis not present

## 2019-07-01 DIAGNOSIS — L8940 Pressure ulcer of contiguous site of back, buttock and hip, unspecified stage: Secondary | ICD-10-CM

## 2019-07-01 DIAGNOSIS — F0390 Unspecified dementia without behavioral disturbance: Secondary | ICD-10-CM | POA: Diagnosis present

## 2019-07-01 DIAGNOSIS — E1100 Type 2 diabetes mellitus with hyperosmolarity without nonketotic hyperglycemic-hyperosmolar coma (NKHHC): Principal | ICD-10-CM | POA: Diagnosis present

## 2019-07-01 DIAGNOSIS — Z79899 Other long term (current) drug therapy: Secondary | ICD-10-CM

## 2019-07-01 DIAGNOSIS — Z9981 Dependence on supplemental oxygen: Secondary | ICD-10-CM

## 2019-07-01 DIAGNOSIS — Z823 Family history of stroke: Secondary | ICD-10-CM

## 2019-07-01 DIAGNOSIS — Z794 Long term (current) use of insulin: Secondary | ICD-10-CM | POA: Diagnosis not present

## 2019-07-01 DIAGNOSIS — E785 Hyperlipidemia, unspecified: Secondary | ICD-10-CM | POA: Diagnosis present

## 2019-07-01 DIAGNOSIS — R0902 Hypoxemia: Secondary | ICD-10-CM | POA: Diagnosis not present

## 2019-07-01 DIAGNOSIS — R9431 Abnormal electrocardiogram [ECG] [EKG]: Secondary | ICD-10-CM | POA: Diagnosis present

## 2019-07-01 DIAGNOSIS — G9341 Metabolic encephalopathy: Secondary | ICD-10-CM | POA: Diagnosis present

## 2019-07-01 DIAGNOSIS — N184 Chronic kidney disease, stage 4 (severe): Secondary | ICD-10-CM | POA: Diagnosis present

## 2019-07-01 DIAGNOSIS — Z66 Do not resuscitate: Secondary | ICD-10-CM | POA: Diagnosis present

## 2019-07-01 DIAGNOSIS — J9601 Acute respiratory failure with hypoxia: Secondary | ICD-10-CM | POA: Diagnosis present

## 2019-07-01 DIAGNOSIS — E1122 Type 2 diabetes mellitus with diabetic chronic kidney disease: Secondary | ICD-10-CM | POA: Diagnosis present

## 2019-07-01 DIAGNOSIS — I129 Hypertensive chronic kidney disease with stage 1 through stage 4 chronic kidney disease, or unspecified chronic kidney disease: Secondary | ICD-10-CM | POA: Diagnosis present

## 2019-07-01 DIAGNOSIS — Z8719 Personal history of other diseases of the digestive system: Secondary | ICD-10-CM

## 2019-07-01 DIAGNOSIS — Z800 Family history of malignant neoplasm of digestive organs: Secondary | ICD-10-CM

## 2019-07-01 DIAGNOSIS — L89322 Pressure ulcer of left buttock, stage 2: Secondary | ICD-10-CM | POA: Diagnosis present

## 2019-07-01 LAB — BLOOD GAS, VENOUS
Acid-base deficit: 5.4 mmol/L — ABNORMAL HIGH (ref 0.0–2.0)
Bicarbonate: 17.3 mmol/L — ABNORMAL LOW (ref 20.0–28.0)
FIO2: 0.21
O2 Saturation: 75.4 %
Patient temperature: 37
pCO2, Ven: 26 mmHg — ABNORMAL LOW (ref 44.0–60.0)
pH, Ven: 7.43 (ref 7.250–7.430)
pO2, Ven: 39 mmHg (ref 32.0–45.0)

## 2019-07-01 LAB — URINALYSIS, COMPLETE (UACMP) WITH MICROSCOPIC
Bacteria, UA: NONE SEEN
Bilirubin Urine: NEGATIVE
Glucose, UA: >=500 mg/dL — AB
Hgb urine dipstick: NEGATIVE
Ketones, ur: NEGATIVE mg/dL
Leukocytes,Ua: NEGATIVE
Nitrite: NEGATIVE
Protein, ur: NEGATIVE mg/dL
Specific Gravity, Urine: 1.008 (ref 1.005–1.030)
Squamous Epithelial / HPF: NONE SEEN (ref 0–5)
pH: 5 (ref 5.0–8.0)

## 2019-07-01 LAB — GLUCOSE, CAPILLARY
Glucose-Capillary: >600 mg/dL (ref 70–99)
Glucose-Capillary: 506 mg/dL (ref 70–99)
Glucose-Capillary: 414 mg/dL — ABNORMAL HIGH (ref 70–99)
Glucose-Capillary: 408 mg/dL — ABNORMAL HIGH (ref 70–99)
Glucose-Capillary: 437 mg/dL — ABNORMAL HIGH (ref 70–99)

## 2019-07-01 LAB — CBC WITH DIFFERENTIAL/PLATELET
Abs Immature Granulocytes: 0.88 10*3/uL — ABNORMAL HIGH (ref 0.00–0.07)
Basophils Absolute: 0.1 10*3/uL (ref 0.0–0.1)
Basophils Relative: 0 %
Eosinophils Absolute: 0 10*3/uL (ref 0.0–0.5)
Eosinophils Relative: 0 %
HCT: 30.4 % — ABNORMAL LOW (ref 39.0–52.0)
Hemoglobin: 9.9 g/dL — ABNORMAL LOW (ref 13.0–17.0)
Immature Granulocytes: 5 %
Lymphocytes Relative: 3 %
Lymphs Abs: 0.5 10*3/uL — ABNORMAL LOW (ref 0.7–4.0)
MCH: 29.8 pg (ref 26.0–34.0)
MCHC: 32.6 g/dL (ref 30.0–36.0)
MCV: 91.6 fL (ref 80.0–100.0)
Monocytes Absolute: 0.6 10*3/uL (ref 0.1–1.0)
Monocytes Relative: 4 %
Neutro Abs: 14.9 10*3/uL — ABNORMAL HIGH (ref 1.7–7.7)
Neutrophils Relative %: 88 %
Platelets: 256 10*3/uL (ref 150–400)
RBC: 3.32 MIL/uL — ABNORMAL LOW (ref 4.22–5.81)
RDW: 12.9 % (ref 11.5–15.5)
WBC: 16.9 10*3/uL — ABNORMAL HIGH (ref 4.0–10.5)
nRBC: 0.1 % (ref 0.0–0.2)

## 2019-07-01 LAB — BASIC METABOLIC PANEL
Anion gap: 13 (ref 5–15)
BUN: 94 mg/dL — ABNORMAL HIGH (ref 8–23)
CO2: 16 mmol/L — ABNORMAL LOW (ref 22–32)
Calcium: 8 mg/dL — ABNORMAL LOW (ref 8.9–10.3)
Chloride: 102 mmol/L (ref 98–111)
Creatinine, Ser: 3.62 mg/dL — ABNORMAL HIGH (ref 0.61–1.24)
GFR calc Af Amer: 17 mL/min — ABNORMAL LOW (ref >60)
GFR calc non Af Amer: 14 mL/min — ABNORMAL LOW (ref >60)
Glucose, Bld: 732 mg/dL (ref 70–99)
Potassium: 5.1 mmol/L (ref 3.5–5.1)
Sodium: 131 mmol/L — ABNORMAL LOW (ref 135–145)

## 2019-07-01 MED ORDER — ACETAMINOPHEN 325 MG PO TABS: 650.0000 mg | ORAL_TABLET | Freq: Four times a day (QID) | ORAL | Status: DC | PRN

## 2019-07-01 MED ORDER — SODIUM CHLORIDE 0.9 % IV BOLUS
1000.0000 mL | Freq: Once | INTRAVENOUS | Status: AC
  Administered 2019-07-01: 1000 mL via INTRAVENOUS

## 2019-07-01 MED ORDER — ACETAMINOPHEN 650 MG RE SUPP: 650.0000 mg | Freq: Four times a day (QID) | RECTAL | Status: DC | PRN

## 2019-07-01 MED ORDER — INSULIN ASPART 100 UNIT/ML ~~LOC~~ SOLN: 0.0000 [IU] | SUBCUTANEOUS | Status: DC

## 2019-07-01 MED ORDER — SODIUM CHLORIDE 0.9 % IV SOLN: INTRAVENOUS | Status: DC

## 2019-07-01 MED ORDER — INSULIN REGULAR HUMAN 100 UNIT/ML IJ SOLN
5.0000 [IU] | Freq: Once | INTRAMUSCULAR | Status: AC
  Administered 2019-07-02: 5 [IU] via INTRAVENOUS
  Filled 2019-07-01: qty 10

## 2019-07-01 MED ORDER — ONDANSETRON HCL 4 MG/2ML IJ SOLN: 4.0000 mg | Freq: Four times a day (QID) | INTRAMUSCULAR | Status: DC | PRN

## 2019-07-01 MED ORDER — INSULIN ASPART 100 UNIT/ML ~~LOC~~ SOLN
15.0000 [IU] | Freq: Once | SUBCUTANEOUS | Status: AC
  Administered 2019-07-01: 15 [IU] via INTRAVENOUS
  Filled 2019-07-01: qty 1

## 2019-07-01 MED ORDER — ONDANSETRON HCL 4 MG PO TABS: 4.0000 mg | ORAL_TABLET | Freq: Four times a day (QID) | ORAL | Status: DC | PRN

## 2019-07-01 MED ORDER — ENOXAPARIN SODIUM 40 MG/0.4ML ~~LOC~~ SOLN
30.0000 mg | SUBCUTANEOUS | Status: DC
  Filled 2019-07-01: qty 0.4

## 2019-07-01 MED ORDER — ENOXAPARIN SODIUM 30 MG/0.3ML ~~LOC~~ SOLN
30.0000 mg | SUBCUTANEOUS | Status: DC
  Administered 2019-07-01 – 2019-07-04 (×4): 30 mg via SUBCUTANEOUS
  Filled 2019-07-01 (×5): qty 0.3

## 2019-07-01 MED ORDER — ENOXAPARIN SODIUM 40 MG/0.4ML ~~LOC~~ SOLN: 40.0000 mg | SUBCUTANEOUS | Status: DC

## 2019-07-01 MED ORDER — SODIUM CHLORIDE 0.45 % IV SOLN: INTRAVENOUS | Status: DC

## 2019-07-01 NOTE — H&P (Signed)
History and Physical    [name] JOI:[id number] DOB: [birth year] DOA: 07/01/2019  PCP: [name], MD   Patient coming from: [facility]  I have personally briefly reviewed patient's old medical records in [place]  Chief Complaint: high blood sugar  HPI: [name] is a 86 y.o. male with medical history significant for dementia, diabetes, hypertension and CKD 4, discharged yesterday from a 5-day hospitalization for COVID-19 pneumonia with respiratory failure who was sent from the assisted living facility for evaluation of hyperglycemia.  His blood sugar was 531.  He had no complaints of cough or shortness of breath or chest pain.  He had no nausea vomiting diarrhea or abdominal pain.  During his recent hospitalization he was on up to 15 L supplemental oxygen and was weaned to room air by discharge.  He completed a course of remdesivir and dexamethasone and got a prednisone taper at discharge.  On arrival of EMS his blood sugar was 531 and his O2 sat was 91% on room air, and EMS placed him on 2 L O2  ED Course: On arrival in the emergency room his O2 sat was 95% on 2 L, he was afebrile and vitals overall unremarkable.  His serum blood sugar was 732.  Creatinine was 3.67 which is around his baseline.  His labs are otherwise unremarkable.  Hemoglobin A1c on 06/26/2019 was 9.1.  Patient received 15 units of IV insulin in the emergency room and blood sugar was 408 by admission.  Hospitalist consulted for admission for blood sugar control in the setting of ongoing steroid therapy  Review of Systems: As per HPI otherwise 10 point review of systems negative.    Past Medical History:  Diagnosis Date  . Cancer ([place])   . Chronic kidney disease   . Colon polyp   . Diabetes mellitus without complication ([place])   . Heart murmur 2008  . Hyperlipidemia   . Hypertension 1980  . Retinal detachment     Past Surgical History:  Procedure Laterality Date  . COLONOSCOPY  2011,06/23/13   Dr Bary Castilla  . COLONOSCOPY  06-28-2011  . COLONOSCOPY WITH PROPOFOL N/A 08/30/2015   Procedure: COLONOSCOPY WITH PROPOFOL;  Surgeon: [name], MD;  Location: [facility] ENDOSCOPY;  Service: Endoscopy;  Laterality: N/A;  . EYE SURGERY    . HERNIA REPAIR    . INTRAMEDULLARY (IM) NAIL INTERTROCHANTERIC Right 01/20/2019   Procedure: INTRAMEDULLARY (IM) NAIL INTERTROCHANTRIC;  Surgeon: Leim Fabry, MD;  Location: [facility] ORS;  Service: Orthopedics;  Laterality: Right;  . LOWER EXTREMITY ANGIOGRAPHY Right 04/20/2018   Procedure: Lower Extremity Angiography;  Surgeon: Algernon Huxley, MD;  Location: [facility] CV LAB;  Service: Cardiovascular;  Laterality: Right;  . POLYPECTOMY  06-23-13  . ]       reports that he has never smoked. He has never used smokeless tobacco. He reports that he does not drink alcohol or use drugs.  Allergies  Allergen Reactions  . Meperidine Other (See Comments)    Severe bradycardia Severe bradycardia   . Penicillins Rash    Family History  Problem Relation Age of Onset  . Colon cancer Mother   . Diabetes Father   . Diabetes Brother   . Stroke Brother   . Diabetes Son      Prior to Admission medications   Medication Sig Start Date End Date Taking? Authorizing Provider  acetaminophen (TYLENOL) 500 MG tablet Take 500 mg by mouth every 12 (twelve) hours as needed for  mild pain.    [provider]  amLODipine (NORVASC) 10 MG tablet Take 10 mg by mouth daily.  05/28/13   [provider]  ascorbic acid (VITAMIN C) 500 MG tablet Take 500 mg by mouth 2 (two) times daily. 06/23/19 07/07/19  [provider]  aspirin EC 81 MG EC tablet Take 1 tablet (81 mg total) by mouth daily. 04/22/18   Max Sane, MD  B-Complex TABS Take 1 tablet by mouth daily.    [provider]  calcitRIOL (ROCALTROL) 0.25 MCG capsule Take 0.25 mcg by mouth daily.  07/13/18   [provider]  calcium carbonate (OSCAL) 1500 (600 Ca) MG TABS tablet Take  600 mg of elemental calcium by mouth 2 (two) times daily.    [provider]  Cholecalciferol (VITAMIN D) 50 MCG (2000 UT) tablet Take 2,000 Units by mouth daily.    [provider]  denosumab (PROLIA) 60 MG/ML SOSY injection Inject 60 mg into the skin every 6 (six) months.    [provider]  ferrous sulfate 325 (65 FE) MG tablet Take 325 mg by mouth daily with breakfast.    [provider]  furosemide (LASIX) 20 MG tablet Take 10-40 mg by mouth 2 (two) times daily. Take 2 tablets (40mg ) by mouth every morning and take  tablet (10mg ) by mouth at lunchtime    [provider]  JALYN 0.5-0.4 MG CAPS Take 1 capsule by mouth daily.     [provider]  linagliptin (TRADJENTA) 5 MG TABS tablet Take 5 mg by mouth daily.    [provider]  losartan (COZAAR) 25 MG tablet Take 0.5 tablets (12.5 mg total) by mouth 2 (two) times daily. 01/23/19   Loletha Grayer, MD  Multiple Vitamin (MULTIVITAMIN) capsule Take 1 capsule by mouth daily.    [provider]  Nutritional Supplements (FEEDING SUPPLEMENT, NEPRO CARB STEADY,) LIQD Take 237 mLs by mouth 3 (three) times daily with meals. 06/30/19   Max Sane, MD  potassium chloride (KLOR-CON) 10 MEQ tablet Take 10 mEq by mouth daily.    [provider]  predniSONE (STERAPRED UNI-PAK 21 TAB) 10 MG (21) TBPK tablet Start 60 mg po daily, taper 10 mg daily until done 06/30/19   Max Sane, MD  sodium bicarbonate 650 MG tablet Take 650 mg by mouth 2 (two) times daily.    [provider]  triamcinolone ointment (KENALOG) 0.1 % Apply 1 application topically 2 (two) times daily. (apply to arms and legs)    [provider]  zinc gluconate 50 MG tablet Take 100 mg by mouth daily. 06/24/19 07/07/19  [provider]    Physical Exam: Vitals:   07/01/19 1730 07/01/19 1800 07/01/19 2130 07/01/19 2200  BP: 134/60 (!) 144/63 124/63 (!) 142/65  Pulse: 80 81 79 80  Resp: 14  18 (!) 21 (!) 24  Temp:      TempSrc:      SpO2: 97% 96% 94% 94%  Weight:      Height:         Vitals:   07/01/19 1730 07/01/19 1800 07/01/19 2130 07/01/19 2200  BP: 134/60 (!) 144/63 124/63 (!) 142/65  Pulse: 80 81 79 80  Resp: 14 18 (!) 21 (!) 24  Temp:      TempSrc:      SpO2: 97% 96% 94% 94%  Weight:      Height:        Constitutional: NAD, alert and oriented x 2+/  Knew month Eyes: PERRL, lids and conjunctivae normal ENMT: Mucous membranes are moist.  Neck: normal, supple, no masses, no thyromegaly Respiratory: clear to auscultation bilaterally, no wheezing, no crackles. Normal respiratory effort. No accessory muscle use.  Cardiovascular: Regular rate and rhythm, no murmurs / rubs / gallops. No extremity edema. 2+ pedal pulses. No carotid bruits.  Abdomen: no tenderness, no masses palpated. No hepatosplenomegaly. Bowel sounds positive.  Musculoskeletal: no clubbing / cyanosis. No joint deformity upper and lower extremities.  Skin: no rashes, lesions, ulcers.  Neurologic: No gross focal neurologic deficit. Psychiatric: Normal mood and affect.   Labs on Admission: I have personally reviewed following labs and imaging studies  CBC: Recent Labs  Lab 06/27/19 0407 06/28/19 0358 06/29/19 0527 06/30/19 0410 07/01/19 1639  WBC 21.9* 18.2* 14.2* 14.4* 16.9*  NEUTROABS 20.7* 17.1* 13.0* 12.3* 14.9*  HGB 9.4* 9.6* 9.8* 9.5* 9.9*  HCT 28.1* 27.6* 28.7* 28.2* 30.4*  MCV 90.9 87.9 88.6 89.5 91.6  PLT 236 260 251 245 542   Basic Metabolic Panel: Recent Labs  Lab 06/26/19 0343 06/26/19 0343 06/27/19 0407 06/28/19 0358 06/29/19 0527 06/30/19 0410 07/01/19 1639  NA 133*   < > 136 143 142 140 131*  K 4.0   < > 4.3 3.9 4.0 4.2 5.1  CL 106   < > 109 114* 111 112* 102  CO2 14*   < > 12* 17* 16* 17* 16*  GLUCOSE 188*   < > 315* 56* 65* 76 732*  BUN 95*   < > 113* 111* 100* 98* 94*  CREATININE 4.82*   < > 4.54* 4.32* 3.87* 3.49* 3.62*  CALCIUM 7.9*   < > 7.3* 7.3* 7.4*  7.5* 8.0*  MG 2.2  --  2.3 2.5* 2.5* 2.5*  --    < > = values in this interval not displayed.   GFR: Estimated Creatinine Clearance: 14.4 mL/min (A) (by C-G formula based on SCr of 3.62 mg/dL (H)). Liver Function Tests: Recent Labs  Lab 06/26/19 0343 06/27/19 0407 06/28/19 0358 06/29/19 0527 06/30/19 0410  AST 46* 64* 43* 55* 47*  ALT 27 38 35 45* 44  ALKPHOS 43 44 45 53 52  BILITOT 0.7 0.9 0.6 0.8 0.5  PROT 6.4* 5.5* 5.6* 6.0* 5.7*  ALBUMIN 2.8* 2.8* 2.6* 2.8* 2.7*   No results for input(s): LIPASE, AMYLASE in the last 168 hours. No results for input(s): AMMONIA in the last 168 hours. Coagulation Profile: Recent Labs  Lab 06/25/19 1530  INR 1.2   Cardiac Enzymes: No results for input(s): CKTOTAL, CKMB, CKMBINDEX, TROPONINI in the last 168 hours. BNP (last 3 results) No results for input(s): PROBNP in the last 8760 hours. HbA1C: No results for input(s): HGBA1C in the last 72 hours. CBG: Recent Labs  Lab 06/30/19 1140 07/01/19 1625 07/01/19 1919 07/01/19 2027 07/01/19 2142  GLUCAP 145* >600* 506* 414* 408*   Lipid Profile: No results for input(s): CHOL, HDL, LDLCALC, TRIG, CHOLHDL, LDLDIRECT in the last 72 hours. Thyroid Function Tests: No results for input(s): TSH, T4TOTAL, FREET4, T3FREE, THYROIDAB in the last 72 hours. Anemia Panel: Recent Labs    06/29/19 0527 06/30/19 0410  FERRITIN 540* 396*   Urine analysis:    Component Value Date/Time   COLORURINE STRAW (A) 07/01/2019 1639   APPEARANCEUR CLEAR (A) 07/01/2019 1639   LABSPEC 1.008 07/01/2019 1639   PHURINE 5.0 07/01/2019 1639   GLUCOSEU >=500 (A) 07/01/2019 1639   HGBUR NEGATIVE 07/01/2019 [place] 07/01/2019 1639  KETONESUR NEGATIVE 07/01/2019 [place] 07/01/2019 1639   NITRITE NEGATIVE 07/01/2019 [place] 07/01/2019 1639    Radiological Exams on Admission: No results found.  EKG: Independently reviewed.    Assessment/Plan Active Problems:   Hyperglycemia due to type 2 diabetes mellitus ([place]) in the setting of prednisone therapy Blood sugar was 732 in the ER improving to 400 by admission with IV fluids and after IV insulin 15 units Continue IV hydration Supplemental insulin coverage    HTN (hypertension) -Blood pressure controlled.  Continue home meds    CKD (chronic kidney disease), stage IV ([place]) Renal function at baseline    History of COVID-19 Diagnosed with Covid on 06/13/2018 and hospitalized from 06/24/2018 to 06/29/2018 Continue prednisone taper pack for completion of treatment for recent COVID-19 pneumonia  Mild hypoxia Secondary to recent COVID-19 pneumonia, much improved from recent admission O2 sat was 91% on room air at assisted living facility Supplemental oxygen to keep sats over 92%      DVT prophylaxis: lovenox Code Status: DNR  Family Communication: none  Disposition Plan: Back to previous home environment Consults called: none     Athena Masse MD [facility]     07/01/2019, 10:13 PM

## 2019-07-01 NOTE — ED Notes (Signed)
Pt changed, peri care provided, linnens changed.  Stretcher locked in lowest position. Call bell within reach. Bed alarm in place. Lights dimmed.

## 2019-07-01 NOTE — ED Provider Notes (Signed)
[facility] Emergency Department Provider Note   ____________________________________________   I have reviewed the triage vital signs and the nursing notes.   HISTORY  Chief Complaint Hyperglycemia  History limited by and level 5 caveat due to: Dementia   HPI [name] is a 86 y.o. male who presents to the emergency department today from living facility because of concerns for hyperglycemia.  The patient himself has dementia and cannot give me a good history.  Per chart review the patient was discharged yesterday from the hospital for a Covid admission.  Patient was on steroids.   Records reviewed. Per medical record review patient has a history of admission to the hospital with discharge yesterday for coronavirus with acute hypoxic respiratory failure.   Past Medical History:  Diagnosis Date  . Cancer ([place])   . Chronic kidney disease   . Colon polyp   . Diabetes mellitus without complication ([place])   . Heart murmur 2008  . Hyperlipidemia   . Hypertension 1980  . Retinal detachment     Patient Active Problem List   Diagnosis Date Noted  . Pressure injury of skin 06/27/2019  . Acute hypoxemic respiratory failure due to COVID-19 ([place]) 06/25/2019  . DKA (diabetic ketoacidoses) ([place]) 06/25/2019  . Dementia without behavioral disturbance ([place]) 06/25/2019  . CKD (chronic kidney disease), stage IV ([place]) 06/25/2019  . Iron deficiency anemia 06/25/2019  . Hyponatremia 06/25/2019  . High anion gap metabolic acidosis 92/04/9416  . AKI (acute kidney injury) ([place]) 06/25/2019  . Hypertensive urgency 06/25/2019  . Severe sepsis ([place]) 06/25/2019  . Closed right hip fracture ([place]) 01/19/2019  . Hip fracture ([place]) 01/19/2019  . Left arm cellulitis 09/05/2018  . Lymphedema 07/27/2018  . Chronic venous insufficiency 07/27/2018  . Ulcers of both lower legs, limited to breakdown of skin ([place]) 06/29/2018  . Peripheral arterial disease ([place]) 05/29/2018  . DM2  (diabetes mellitus, type 2) ([place]) 04/04/2018  . HTN (hypertension) 04/04/2018  . History of colonic polyps 07/14/2015    Past Surgical History:  Procedure Laterality Date  . COLONOSCOPY  2011,06/23/13   Dr Bary Castilla  . COLONOSCOPY  06-28-2011  . COLONOSCOPY WITH PROPOFOL N/A 08/30/2015   Procedure: COLONOSCOPY WITH PROPOFOL;  Surgeon: [name], MD;  Location: [facility] ENDOSCOPY;  Service: Endoscopy;  Laterality: N/A;  . EYE SURGERY    . HERNIA REPAIR    . INTRAMEDULLARY (IM) NAIL INTERTROCHANTERIC Right 01/20/2019   Procedure: INTRAMEDULLARY (IM) NAIL INTERTROCHANTRIC;  Surgeon: Leim Fabry, MD;  Location: [facility] ORS;  Service: Orthopedics;  Laterality: Right;  . LOWER EXTREMITY ANGIOGRAPHY Right 04/20/2018   Procedure: Lower Extremity Angiography;  Surgeon: Algernon Huxley, MD;  Location: [facility];  Service: Cardiovascular;  Laterality: Right;  . POLYPECTOMY  06-23-13  . ]      Prior to Admission medications   Medication Sig Start Date End Date Taking? Authorizing Provider  acetaminophen (TYLENOL) 500 MG tablet Take 500 mg by mouth every 12 (twelve) hours as needed for mild pain.    [provider]  amLODipine (NORVASC) 10 MG tablet Take 10 mg by mouth daily.  05/28/13   [provider]  ascorbic acid (VITAMIN C) 500 MG tablet Take 500 mg by mouth 2 (two) times daily. 06/23/19 07/07/19  [provider]  aspirin EC 81 MG EC tablet Take 1 tablet (81 mg total) by mouth daily. 04/22/18   Max Sane, MD  B-Complex TABS Take 1 tablet by mouth daily.  [provider]  calcitRIOL (ROCALTROL) 0.25 MCG capsule Take 0.25 mcg by mouth daily.  07/13/18   [provider]  calcium carbonate (OSCAL) 1500 (600 Ca) MG TABS tablet Take 600 mg of elemental calcium by mouth 2 (two) times daily.    [provider]  Cholecalciferol (VITAMIN D) 50 MCG (2000 UT) tablet Take 2,000 Units by mouth daily.    [provider]  denosumab (PROLIA)  60 MG/ML SOSY injection Inject 60 mg into the skin every 6 (six) months.    [provider]  ferrous sulfate 325 (65 FE) MG tablet Take 325 mg by mouth daily with breakfast.    [provider]  furosemide (LASIX) 20 MG tablet Take 10-40 mg by mouth 2 (two) times daily. Take 2 tablets (40mg ) by mouth every morning and take  tablet (10mg ) by mouth at lunchtime    [provider]  JALYN 0.5-0.4 MG CAPS Take 1 capsule by mouth daily.     [provider]  linagliptin (TRADJENTA) 5 MG TABS tablet Take 5 mg by mouth daily.    [provider]  losartan (COZAAR) 25 MG tablet Take 0.5 tablets (12.5 mg total) by mouth 2 (two) times daily. 01/23/19   Loletha Grayer, MD  Multiple Vitamin (MULTIVITAMIN) capsule Take 1 capsule by mouth daily.    [provider]  Nutritional Supplements (FEEDING SUPPLEMENT, NEPRO CARB STEADY,) LIQD Take 237 mLs by mouth 3 (three) times daily with meals. 06/30/19   Max Sane, MD  potassium chloride (KLOR-CON) 10 MEQ tablet Take 10 mEq by mouth daily.    [provider]  predniSONE (STERAPRED UNI-PAK 21 TAB) 10 MG (21) TBPK tablet Start 60 mg po daily, taper 10 mg daily until done 06/30/19   Max Sane, MD  sodium bicarbonate 650 MG tablet Take 650 mg by mouth 2 (two) times daily.    [provider]  triamcinolone ointment (KENALOG) 0.1 % Apply 1 application topically 2 (two) times daily. (apply to arms and legs)    [provider]  zinc gluconate 50 MG tablet Take 100 mg by mouth daily. 06/24/19 07/07/19  [provider]    Allergies Meperidine and Penicillins  Family History  Problem Relation Age of Onset  . Colon cancer Mother   . Diabetes Father   . Diabetes Brother   . Stroke Brother   . Diabetes Son     Social History Social History   Tobacco Use  . Smoking status: Never Smoker  . Smokeless tobacco: Never Used  Substance Use Topics  . Alcohol use: No  . Drug use: No     Review of Systems Unable to obtain reliable ROS ____________________________________________   PHYSICAL EXAM:  VITAL SIGNS: ED Triage Vitals  Enc Vitals Group     BP 07/01/19 1616 (!) 143/67     Pulse Rate 07/01/19 1616 86     Resp 07/01/19 1618 19     Temp 07/01/19 1616 97.8 F (36.6 C)     Temp Source 07/01/19 1616 Oral     SpO2 07/01/19 1616 95 %     Weight 07/01/19 1617 163 lb 2.3 oz (74 kg)     Height 07/01/19 1617 5\' 8"  (1.727 m)     Head Circumference --      Peak Flow --      Pain Score 07/01/19 1617 0   Constitutional: Awake and alert. Not completely oriented.  Eyes: Conjunctivae are normal.  ENT  Head: Normocephalic and atraumatic.      Nose: No congestion/rhinnorhea.      Mouth/Throat: Mucous membranes are moist.      Neck: No stridor. Hematological/Lymphatic/Immunilogical: No cervical lymphadenopathy. Cardiovascular: Normal rate, regular rhythm.  No murmurs, rubs, or gallops.  Respiratory: Normal respiratory effort without tachypnea nor retractions. Breath sounds are clear and equal bilaterally. No wheezes/rales/rhonchi. Gastrointestinal: Soft and non tender. No rebound. No guarding.  Genitourinary: Deferred Musculoskeletal: Normal range of motion in all extremities. No lower extremity edema. Neurologic:  Dementia. Normal speech and language. No gross focal neurologic deficits are appreciated.  Skin:  Skin is warm, dry and intact. No rash noted. Psychiatric: Mood and affect are normal. Speech and behavior are normal. Patient exhibits appropriate insight and judgment.  ____________________________________________    LABS (pertinent positives/negatives)  UA clear, negative ketones BMP na 131, co2 16, glu 732, cr 3.62 CBC wbc 16.9, hgb 9.9, plt 256 vbg pH 7.43  ____________________________________________   EKG  None  ____________________________________________     RADIOLOGY  None  ____________________________________________   PROCEDURES  Procedures  ____________________________________________   INITIAL IMPRESSION / ASSESSMENT AND PLAN / ED COURSE  Pertinent labs & imaging results that were available during my care of the patient were reviewed by me and considered in my medical decision making (see chart for details).   Patient presented to the emergency department today because of concerns for high blood sugar.  Patient had a recent admission to the hospital for Covid.  Sugars were extremely elevated here in the emergency department.  However I do not believe patient is in DKA.  He was given IV fluids and insulin which did help improve his sugars however he continued to be hyperglycemic.  Will plan on admission for further management.  ____________________________________________   FINAL CLINICAL IMPRESSION(S) / ED DIAGNOSES  Final diagnoses:  Hyperglycemia     Note: This dictation was prepared with Dragon dictation. Any transcriptional errors that result from this process are unintentional     Nance Pear, MD 07/01/19 2244

## 2019-07-01 NOTE — ED Notes (Addendum)
Pt CBG 506. Dr. Archie Balboa notified. Order to recheck CBG when bolus is finished. Pt resting comfortably at this time. Call bell within reach. Stretcher locked in lowest position. Bed alarm in place.

## 2019-07-01 NOTE — ED Notes (Signed)
When asked pt why is in ED today, he states he "just got all mixed up". Denies pain at this time.  Pt in NAD.

## 2019-07-01 NOTE — ED Notes (Signed)
MD Damita Dunnings notified of CBG 437. Verbal order to hold SQ insulin at this time. Order for Novolin 5 Units intravenous.

## 2019-07-01 NOTE — ED Notes (Signed)
Daughter, [name], updated on plan of care.

## 2019-07-01 NOTE — ED Notes (Signed)
Date and time results received: 07/01/19 1730 (use smartphrase ".now" to insert current time)  Test: Glucose  Critical Value: 732  Name of Provider Notified: Archie Balboa MD  Orders Received? Or Actions Taken?: No new orders at this time

## 2019-07-01 NOTE — ED Notes (Signed)
Dr. Archie Balboa notified of CBG 414. No new orders at this time

## 2019-07-01 NOTE — ED Notes (Signed)
Pt changed and peri care provided. [place] changed.  Bed sore noted to be wrapped to sacrum.

## 2019-07-01 NOTE — ED Notes (Signed)
Pt resting comfortably at this time. Stretcher locked in lowest position. Call bell within reach. Bed alarm in place.

## 2019-07-01 NOTE — ED Notes (Signed)
Douglass Rivers, [place], called for updated. This RN updated to the best of my ability.  Daughter, [name], called for an update.  Updated to the best of this RN's ability.

## 2019-07-01 NOTE — ED Notes (Signed)
Pt resting comfortably at this time. Stretcher locked in lowest position. Call bell within reach. Bed alarm in place. Lights dimmed

## 2019-07-01 NOTE — ED Triage Notes (Signed)
Pt to ED via [facility] from [facility]. EMS reports pt was released from hospital yesterday. EMS reports pt has CBG today of 531 and is being treated for pneumonia, also has a bedsore. EMS reports pt was hypoxic on RA 91%, placed on 2L [place], improved to 94%. Hx dementia.  Cleared from quarantine from [place] 2 days ago.  Pt in NAD upon arrival  Pt oriented to person and place. Disoriented to time.

## 2019-07-02 ENCOUNTER — Encounter: Payer: Self-pay | Admitting: Internal Medicine

## 2019-07-02 ENCOUNTER — Other Ambulatory Visit: Payer: Self-pay

## 2019-07-02 DIAGNOSIS — G9341 Metabolic encephalopathy: Secondary | ICD-10-CM

## 2019-07-02 DIAGNOSIS — K5900 Constipation, unspecified: Secondary | ICD-10-CM | POA: Diagnosis present

## 2019-07-02 DIAGNOSIS — Z8616 Personal history of COVID-19: Secondary | ICD-10-CM | POA: Diagnosis not present

## 2019-07-02 DIAGNOSIS — Z833 Family history of diabetes mellitus: Secondary | ICD-10-CM | POA: Diagnosis not present

## 2019-07-02 DIAGNOSIS — F0390 Unspecified dementia without behavioral disturbance: Secondary | ICD-10-CM | POA: Diagnosis present

## 2019-07-02 DIAGNOSIS — N184 Chronic kidney disease, stage 4 (severe): Secondary | ICD-10-CM | POA: Diagnosis present

## 2019-07-02 DIAGNOSIS — E0865 Diabetes mellitus due to underlying condition with hyperglycemia: Secondary | ICD-10-CM | POA: Diagnosis not present

## 2019-07-02 DIAGNOSIS — T380X5A Adverse effect of glucocorticoids and synthetic analogues, initial encounter: Secondary | ICD-10-CM | POA: Diagnosis present

## 2019-07-02 DIAGNOSIS — Z66 Do not resuscitate: Secondary | ICD-10-CM | POA: Diagnosis present

## 2019-07-02 DIAGNOSIS — Z888 Allergy status to other drugs, medicaments and biological substances status: Secondary | ICD-10-CM | POA: Diagnosis not present

## 2019-07-02 DIAGNOSIS — R9431 Abnormal electrocardiogram [ECG] [EKG]: Secondary | ICD-10-CM | POA: Diagnosis present

## 2019-07-02 DIAGNOSIS — I129 Hypertensive chronic kidney disease with stage 1 through stage 4 chronic kidney disease, or unspecified chronic kidney disease: Secondary | ICD-10-CM | POA: Diagnosis present

## 2019-07-02 DIAGNOSIS — E1122 Type 2 diabetes mellitus with diabetic chronic kidney disease: Secondary | ICD-10-CM | POA: Diagnosis present

## 2019-07-02 DIAGNOSIS — J9601 Acute respiratory failure with hypoxia: Secondary | ICD-10-CM

## 2019-07-02 DIAGNOSIS — Z823 Family history of stroke: Secondary | ICD-10-CM | POA: Diagnosis not present

## 2019-07-02 DIAGNOSIS — E785 Hyperlipidemia, unspecified: Secondary | ICD-10-CM | POA: Diagnosis present

## 2019-07-02 DIAGNOSIS — Z800 Family history of malignant neoplasm of digestive organs: Secondary | ICD-10-CM | POA: Diagnosis not present

## 2019-07-02 DIAGNOSIS — E11649 Type 2 diabetes mellitus with hypoglycemia without coma: Secondary | ICD-10-CM | POA: Diagnosis not present

## 2019-07-02 DIAGNOSIS — E1165 Type 2 diabetes mellitus with hyperglycemia: Secondary | ICD-10-CM | POA: Diagnosis present

## 2019-07-02 DIAGNOSIS — E1100 Type 2 diabetes mellitus with hyperosmolarity without nonketotic hyperglycemic-hyperosmolar coma (NKHHC): Secondary | ICD-10-CM | POA: Diagnosis present

## 2019-07-02 DIAGNOSIS — Z8719 Personal history of other diseases of the digestive system: Secondary | ICD-10-CM | POA: Diagnosis not present

## 2019-07-02 DIAGNOSIS — Z880 Allergy status to penicillin: Secondary | ICD-10-CM | POA: Diagnosis not present

## 2019-07-02 DIAGNOSIS — L89322 Pressure ulcer of left buttock, stage 2: Secondary | ICD-10-CM | POA: Diagnosis present

## 2019-07-02 DIAGNOSIS — R739 Hyperglycemia, unspecified: Secondary | ICD-10-CM | POA: Diagnosis present

## 2019-07-02 DIAGNOSIS — J1282 Pneumonia due to coronavirus disease 2019: Secondary | ICD-10-CM | POA: Diagnosis present

## 2019-07-02 DIAGNOSIS — L89312 Pressure ulcer of right buttock, stage 2: Secondary | ICD-10-CM | POA: Diagnosis present

## 2019-07-02 DIAGNOSIS — Z7982 Long term (current) use of aspirin: Secondary | ICD-10-CM | POA: Diagnosis not present

## 2019-07-02 LAB — GLUCOSE, CAPILLARY
Glucose-Capillary: 430 mg/dL — ABNORMAL HIGH (ref 70–99)
Glucose-Capillary: 391 mg/dL — ABNORMAL HIGH (ref 70–99)
Glucose-Capillary: 385 mg/dL — ABNORMAL HIGH (ref 70–99)
Glucose-Capillary: 198 mg/dL — ABNORMAL HIGH (ref 70–99)
Glucose-Capillary: 77 mg/dL (ref 70–99)
Glucose-Capillary: 166 mg/dL — ABNORMAL HIGH (ref 70–99)
Glucose-Capillary: 243 mg/dL — ABNORMAL HIGH (ref 70–99)

## 2019-07-02 LAB — BASIC METABOLIC PANEL
Anion gap: 12 (ref 5–15)
BUN: 83 mg/dL — ABNORMAL HIGH (ref 8–23)
CO2: 16 mmol/L — ABNORMAL LOW (ref 22–32)
Calcium: 7.9 mg/dL — ABNORMAL LOW (ref 8.9–10.3)
Chloride: 115 mmol/L — ABNORMAL HIGH (ref 98–111)
Creatinine, Ser: 3.32 mg/dL — ABNORMAL HIGH (ref 0.61–1.24)
GFR calc Af Amer: 19 mL/min — ABNORMAL LOW (ref >60)
GFR calc non Af Amer: 16 mL/min — ABNORMAL LOW (ref >60)
Glucose, Bld: 105 mg/dL — ABNORMAL HIGH (ref 70–99)
Potassium: 4.7 mmol/L (ref 3.5–5.1)
Sodium: 143 mmol/L (ref 135–145)

## 2019-07-02 LAB — MAGNESIUM: Magnesium: 2.4 mg/dL (ref 1.7–2.4)

## 2019-07-02 MED ORDER — INSULIN REGULAR HUMAN 100 UNIT/ML IJ SOLN
5.0000 [IU] | Freq: Once | INTRAMUSCULAR | Status: AC
  Administered 2019-07-02: 5 [IU] via INTRAVENOUS
  Filled 2019-07-02: qty 10

## 2019-07-02 MED ORDER — JUVEN PO PACK
1.0000 | PACK | Freq: Two times a day (BID) | ORAL | Status: DC
  Administered 2019-07-02 – 2019-07-03 (×3): 1 via ORAL

## 2019-07-02 MED ORDER — INSULIN ASPART 100 UNIT/ML ~~LOC~~ SOLN
0.0000 [IU] | SUBCUTANEOUS | Status: DC
  Administered 2019-07-02: 20 [IU] via SUBCUTANEOUS
  Administered 2019-07-02: 4 [IU] via SUBCUTANEOUS
  Administered 2019-07-02: 7 [IU] via SUBCUTANEOUS
  Administered 2019-07-02: 08:00:00 4 [IU] via SUBCUTANEOUS
  Administered 2019-07-03: 7 [IU] via SUBCUTANEOUS
  Administered 2019-07-03 (×3): 4 [IU] via SUBCUTANEOUS
  Administered 2019-07-04 (×2): 3 [IU] via SUBCUTANEOUS
  Administered 2019-07-04: 12:00:00 4 [IU] via SUBCUTANEOUS
  Administered 2019-07-04: 7 [IU] via SUBCUTANEOUS
  Administered 2019-07-04 – 2019-07-05 (×2): 3 [IU] via SUBCUTANEOUS
  Filled 2019-07-02 (×15): qty 1

## 2019-07-02 MED ORDER — GLUCERNA SHAKE PO LIQD
237.0000 mL | Freq: Two times a day (BID) | ORAL | Status: DC
  Administered 2019-07-02 – 2019-07-05 (×4): 237 mL via ORAL

## 2019-07-02 MED ORDER — ADULT MULTIVITAMIN W/MINERALS CH
1.0000 | ORAL_TABLET | Freq: Every day | ORAL | Status: DC
  Administered 2019-07-02 – 2019-07-05 (×3): 1 via ORAL
  Filled 2019-07-02 (×3): qty 1

## 2019-07-02 MED ORDER — SODIUM CHLORIDE 0.9 % IV BOLUS
500.0000 mL | Freq: Once | INTRAVENOUS | Status: AC
  Administered 2019-07-02: 500 mL via INTRAVENOUS

## 2019-07-02 NOTE — ED Notes (Signed)
Pt has productive cough, comfortable on 2L, hypoxic on room air. Per previous RN pt was not on supplemental o2 at [facility].

## 2019-07-02 NOTE — ED Notes (Signed)
Pt changed, peri care provided

## 2019-07-02 NOTE — ED Notes (Signed)
MD Damita Dunnings notified of continued high CBG. Verbal order for 500 NS bolus. Repeat order for 5 units Novolin IV.

## 2019-07-02 NOTE — Progress Notes (Signed)
Initial Nutrition Assessment  DOCUMENTATION CODES:   Not applicable  INTERVENTION:  -Glucerna Shake po BID, each supplement provides 220 kcal and 10 grams of protein  -1 packet Juven BID, each packet provides 95 calories, 2.5 grams of protein (collagen), and 9.8 grams of carbohydrate (3 grams sugar); also contains 7 grams of L-arginine and L-glutamine, 300 mg vitamin C, 15 mg vitamin E, 1.2 mcg vitamin B-12, 9.5 mg zinc, 200 mg calcium, and 1.5 g  Calcium Beta-hydroxy-Beta-methylbutyrate to support wound healing  MVI with minerals daily   NUTRITION DIAGNOSIS:   Inadequate oral intake related to wound healing, acute illness(DTI to buttocks, diabetic ulcer to left heel, recent discharge on 1/20 for COVID-19 pneumonia) as evidenced by other (comment)(po intake insufficinet to meet estiamted needs).   GOAL:   Patient will meet greater than or equal to 90% of their needs    MONITOR:   PO intake, Weight trends, Supplement acceptance, Labs, I & O's  REASON FOR ASSESSMENT:   Malnutrition Screening Tool    ASSESSMENT:  RD working remotely.  86 year old male with past medical history of T2DM, HTN, PAD, mild dementia, CKDIV, recent [facility] discharge 1/20 for COVID-19 pneumonia with respiratory failure who presented from SNF for evaluation of hyperglycemia. On arrival to ED, blood sugar was 732 and received 15 units of IV insulin. Patient admitted for observation and blood sugar control in the setting of ongoing steroid therapy.  1/4 - COVID-19 diagnosed  Patient documented with 50% po intake of breakfast this morning. Patient noted with DTI and MASD to buttocks as well as diabetic ulcer to left heel. RD will provide Juven to support wound healing as well as Glucerna Shake to aid with calorie/protein intake.   Patient seen remotely secondary to COVID-19 by nutrition department on 1/18 during previous admission. Per notes, patient suspected with poor appetite and oral intake prior to admit  related to COVID-19. Oral nutrition supplements were provided at that time to aid with increased calorie/protein needs.   Current wt 74 kg (162.8 lbs) UBW 64.4 kg - 68.8 kg over the past year, unsure if current weight is accurate given +20 lbs of UBW. No edema noted on RN flowsheet.   Medications reviewed and include: Lovenox, SS novolog  Labs: CBGs 77,198,385,391,430,437,408 x 24 hrs  NUTRITION - FOCUSED PHYSICAL EXAM: Unable to complete at this time, RD working remotely.  Diet Order:   Diet Order            Diet heart healthy/carb modified Room service appropriate? Yes; Fluid consistency: Thin  Diet effective now              EDUCATION NEEDS:   No education needs have been identified at this time  Skin:  Skin Assessment: Reviewed RN Assessment(DTI; buttocks, diabetic ulcer; left heel; MASD; buttocks)  Last BM:  Unknown  Height:   Ht Readings from Last 1 Encounters:  07/01/19 5\' 8"  (1.727 m)    Weight:   Wt Readings from Last 1 Encounters:  07/01/19 74 kg    Ideal Body Weight:  70 kg  BMI:  Body mass index is 24.81 kg/m.  Estimated Nutritional Needs:   Kcal:  1900-2100  Protein:  95-105  Fluid:  >/= 1.8 L/day   Lajuan Lines, RD, LDN Clinical Nutrition [facility] Telephone [phone] After Hours/Weekend Pager: [phone]

## 2019-07-02 NOTE — ED Notes (Signed)
Spoke with MD Damita Dunnings. Pt placed on airborne and contact precautions d/t continued s/s of COVID from previous +COVID test within the last 21 days.

## 2019-07-02 NOTE — Evaluation (Signed)
Physical Therapy Evaluation Patient Details Name: [name] MRN: [id number] DOB: September 16, 1931 Today's Date: 07/02/2019   History of Present Illness  [name] is an 14YOM, lives in ALF at [place], [place]: dementia, DM, HTN, PVD and CKD IV who presented with SOB earlier this month. Patient had tested positive for COVID 1/4 (12 days PTA) but doing well.  Pt recently DC but came back with uncontrolled hyperglycemia, in 700s 1DA better today.  Clinical Impression  Pt admitted with above diagnosis. Pt currently with functional limitations due to the deficits listed below (see "PT Problem List"). Upon entry, pt in bed, awake and agreeable to participate. The pt is alert and oriented x3, pleasant, minimally conversational (seems somewhat drowsy/flat affect), and generally a fair historian. Pt familir to author from prior admissions. Pt alerts author of condomcath malfunction and soiled bed. MaxA to EOB, Mod to rise, minGuard assist to take pivot/steps to recliner, and good safety awareness with controlled descent to chair. CNA assists with bed change and chair set-up, pericare. Pt agreeable to attempt more transfers training for balance and leg exercise. Pt does well with 3 more transfers modA to come up then delayed establishment of balance ~3-5 seconds. This is much improved from last session at prior admission. Pt does all of this on RA, SpO2 remains 90-93% for 95% of session, also largely improved since last admission. Functional mobility assessment demonstrates increased effort/time requirements, poor tolerance, and need for physical assistance, whereas the patient performed these at a higher level of independence PTA. Pt will benefit from skilled PT intervention to increase independence and safety with basic mobility in preparation for discharge to the venue listed below.       Follow Up Recommendations SNF;Supervision/Assistance - 24 hour;Supervision for mobility/OOB    Equipment Recommendations  None recommended by PT    Recommendations for Other Services       Precautions / Restrictions Precautions Precautions: Fall Restrictions Weight Bearing Restrictions: No      Mobility  Bed Mobility Overal bed mobility: Needs Assistance Bed Mobility: Supine to Sit     Supine to sit: Max assist        Transfers Overall transfer level: Needs assistance Equipment used: Rolling walker (2 wheeled) Transfers: Sit to/from [facility] Sit to Stand: Mod assist;Max assist Stand pivot transfers: Min assist       General transfer comment: Pt able to rise 4x in session, able to obtain balance once up, and safe with stepping strategy. SpO2 >92% on RA throughout.  Ambulation/Gait Ambulation/Gait assistance: (deferred, but likely could commence next session.)              Stairs            Wheelchair Mobility    Modified Rankin (Stroke Patients Only)       Balance Overall balance assessment: Needs assistance;History of Falls Sitting-balance support: Feet supported;Single extremity supported Sitting balance-Leahy Scale: Fair     Standing balance support: Bilateral upper extremity supported;During functional activity Standing balance-Leahy Scale: Poor                               Pertinent Vitals/Pain Pain Assessment: No/denies pain    Home Living Family/patient expects to be discharged to:: Skilled nursing facility                      Prior Function Level of Independence: Needs assistance  ADL's / Homemaking Assistance Needed: Up until 97MO, Pt was Independent with ADLs, light household IADLs. Pt. received meals on wheels, son assists with medication management using a pillbox. Pt.drove minimally. Familiy assists with groceries.        Hand Dominance   Dominant Hand: Right    Extremity/Trunk Assessment                Communication   Communication: No difficulties  Cognition Arousal/Alertness:  Awake/alert Behavior During Therapy: WFL for tasks assessed/performed Overall Cognitive Status: Within Functional Limits for tasks assessed                                        General Comments      Exercises Other Exercises Other Exercises: STS from recliner with standing x 30sec to balance (requires modA to rise from recliner)   Assessment/Plan    PT Assessment Patient needs continued PT services  PT Problem List Decreased strength;Decreased range of motion;Decreased activity tolerance;Decreased balance;Decreased mobility       PT Treatment Interventions DME instruction;Therapeutic exercise;Stair training;Functional mobility training;Gait training;Therapeutic activities;Patient/family education    PT Goals (Current goals can be found in the Care Plan section)  Acute Rehab PT Goals Patient Stated Goal: regain strength PT Goal Formulation: With patient Time For Goal Achievement: 07/16/19 Potential to Achieve Goals: Fair    Frequency Min 2X/week   Barriers to discharge        Co-evaluation               AM-PAC PT "6 Clicks" Mobility  Outcome Measure Help needed turning from your back to your side while in a flat bed without using bedrails?: A Lot Help needed moving from lying on your back to sitting on the side of a flat bed without using bedrails?: Total Help needed moving to and from a bed to a chair (including a wheelchair)?: A Lot Help needed standing up from a chair using your arms (e.g., wheelchair or bedside chair)?: A Lot Help needed to walk in hospital room?: A Lot Help needed climbing 3-5 steps with a railing? : Total 6 Click Score: 10    End of Session Equipment Utilized During Treatment: Gait belt Activity Tolerance: Patient tolerated treatment well;Patient limited by fatigue;No increased pain Patient left: in chair;with chair alarm set;with call bell/phone within reach;with nursing/sitter in room Nurse Communication: Mobility  status PT Visit Diagnosis: Unsteadiness on feet (R26.81);Other abnormalities of gait and mobility (R26.89);Ataxic gait (R26.0);Difficulty in walking, not elsewhere classified (R26.2)    Time: 1531-1601 PT Time Calculation (min) (ACUTE ONLY): 30 min   Charges:   PT Evaluation $PT Eval Moderate Complexity: 1 Mod PT Treatments $Therapeutic Exercise: 8-22 mins        5:02 PM, 07/02/19 Etta Grandchild, PT, DPT Physical Therapist - [facility]  [phone] ([place])   Janeece Blok C 07/02/2019, 4:59 PM

## 2019-07-02 NOTE — Progress Notes (Signed)
CCMD called d/t patient having a run of a junctional rythym and ST depression. Dr. Clementeen Graham in room with patient, made him aware, new order for EKG.

## 2019-07-02 NOTE — Clinical Social Work Note (Signed)
CSW acknowledges SNF consult. Patient is from [place] and is active with [facility] for PT and RN. According to [facility] notes from last admission, family was planning on hiring 24/7 caregivers as well. If patient needs SNF placement, please place PT and OT orders so patient can be evaluated.  Dayton Scrape, [place]

## 2019-07-02 NOTE — Progress Notes (Signed)
PROGRESS NOTE                                                                                                                                                                                                             Patient Demographics:    [name], is a 86 y.o. male, DOB - Dec 21, 1931, FTD:[id number]  Admit date - 07/01/2019   Admitting Physician Louellen Molder, MD  Outpatient Primary MD for the patient is [name], MD  LOS - 0  Outpatient Specialists none  Chief Complaint  Patient presents with  . Pneumonia  . Hyperglycemia       Brief Narrative 86 year old male with history of dementia, uncontrolled diabetes mellitus type 2, CKD stage IV who was discharged on 1/5:20 days hospitalization for COVID-19 pneumonia and respiratory failure was sent from the assisted living for hyperglycemia.  His blood glucose was 531.  Patient had completed 5-day course of remdesivir and received dexamethasone and discharged on prednisone taper. EMS found his blood glucose to be 531 and oxygen saturation of 91% on room air, placed on 2 L via nasal cannula. In the ED he was maintaining sats in the mid 90s on 2 L, afebrile.  Blood glucose of 732 and creatinine of 3.67 (at baseline).  He was given 15 units IV insulin along with IV fluids.  Admitted for further management.   Subjective:   Patient very confused.  Still requiring 2 L via nasal cannula.   Assessment  & Plan :   Principal problem   Hyperosmolar non-ketotic state in patient with type 2 diabetes mellitus ([place]) CBG improved this morning with IV fluids and IV insulin.  Prednisone has been discontinued.  Will monitor him on sliding scale insulin for now.  Active problems Acute respiratory failure with hypoxia Possibly in the setting of recent COVID-19 infection.  Still on 2 L via nasal cannula.  Wean oxygen as tolerated. Completed 5 days of remdesivir recently.  Holding  further steroid use in the setting of hyperglycemia.  Acute metabolic encephalopathy Patient delirious with mental status worsened from his baseline underlying dementia. I suspect this is due to his hyperglycemia and hypoxia.  Avoid benzos and narcotics.  No signs of infection.  Monitor closely.     CKD (chronic kidney disease), stage IV ([place]) Renal function at baseline.  Avoid nephrotoxins.  Generalized  weakness PT evaluation.  Junctional rhythm with prolonged QTC. Seen on telemetry.  Check twelve-lead EKG, potassium and magnesium.  Leukocytosis Suspect due to recent steroid use.  No signs of active infection  Patient status: Inpatient Patient presented with hyperosmolar nonketotic state, acute metabolic encephalopathy and acute hypoxic respiratory failure.  Also has junctional rhythm with prolonged QTC.  He is at high risk for further decompensation of his hyperglycemia and go into full-blown DKA, worsening of his encephalopathy and respiratory decompensation in the setting of recent COVID-19 infection.  Patient needs to be monitored in an inpatient setting for at least >2 midnight.  Code Status : DNR  Family Communication  : None  Disposition Plan  : Possibly return to ALF in 24-48 hours if improved  Barriers For Discharge : Active symptoms (encephalopathy, hypoglycemia, hypoxia)  Consults  : None  Procedures  : None  DVT Prophylaxis  : Subcu Lovenox Lab Results  Component Value Date   PLT 256 07/01/2019    Antibiotics  :   Anti-infectives (From admission, onward)   None        Objective:   Vitals:   07/02/19 0300 07/02/19 0400 07/02/19 0746 07/02/19 1030  BP: (!) 160/71 (!) 173/65 138/60 126/63  Pulse: 80 72 77   Resp: 19 18 19 18   Temp:  98 F (36.7 C) 97.9 F (36.6 C)   TempSrc:  Oral Oral   SpO2: 96% 96% 99% 94%  Weight:      Height:        Wt Readings from Last 3 Encounters:  07/01/19 74 kg  06/26/19 74.2 kg  01/19/19 64.4 kg      Intake/Output Summary (Last 24 hours) at 07/02/2019 1447 Last data filed at 07/02/2019 0900 Gross per 24 hour  Intake 2291.22 ml  Output 1050 ml  Net 1241.22 ml     Physical Exam  Gen: Elderly male lying in bed, confused, HEENT: Moist mucosa, supple neck Chest: clear b/l, no added sounds CVS: S1-S2 regular, no murmurs GI: Soft, nondistended, nontender Musculoskeletal: Warm, no edema CNs: Alert and awake, oriented x0     Data Review:    CBC Recent Labs  Lab 06/27/19 0407 06/28/19 0358 06/29/19 0527 06/30/19 0410 07/01/19 1639  WBC 21.9* 18.2* 14.2* 14.4* 16.9*  HGB 9.4* 9.6* 9.8* 9.5* 9.9*  HCT 28.1* 27.6* 28.7* 28.2* 30.4*  PLT 236 260 251 245 256  MCV 90.9 87.9 88.6 89.5 91.6  MCH 30.4 30.6 30.2 30.2 29.8  MCHC 33.5 34.8 34.1 33.7 32.6  RDW 12.1 12.2 12.3 12.6 12.9  LYMPHSABS 0.5* 0.4* 0.4* 0.5* 0.5*  MONOABS 0.4 0.4 0.2 0.6 0.6  EOSABS 0.0 0.0 0.0 0.0 0.0  BASOSABS 0.0 0.0 0.0 0.1 0.1    Chemistries  Recent Labs  Lab 06/26/19 0343 06/26/19 0343 06/27/19 0407 06/28/19 0358 06/29/19 0527 06/30/19 0410 07/01/19 1639  NA 133*   < > 136 143 142 140 131*  K 4.0   < > 4.3 3.9 4.0 4.2 5.1  CL 106   < > 109 114* 111 112* 102  CO2 14*   < > 12* 17* 16* 17* 16*  GLUCOSE 188*   < > 315* 56* 65* 76 732*  BUN 95*   < > 113* 111* 100* 98* 94*  CREATININE 4.82*   < > 4.54* 4.32* 3.87* 3.49* 3.62*  CALCIUM 7.9*   < > 7.3* 7.3* 7.4* 7.5* 8.0*  MG 2.2  --  2.3 2.5* 2.5* 2.5*  --  AST 46*  --  64* 43* 55* 47*  --   ALT 27  --  38 35 45* 44  --   ALKPHOS 43  --  44 45 53 52  --   BILITOT 0.7  --  0.9 0.6 0.8 0.5  --    < > = values in this interval not displayed.   ------------------------------------------------------------------------------------------------------------------ No results for input(s): CHOL, HDL, LDLCALC, TRIG, CHOLHDL, LDLDIRECT in the last 72 hours.  Lab Results  Component Value Date   HGBA1C 9.1 (H) 06/26/2019    ------------------------------------------------------------------------------------------------------------------ No results for input(s): TSH, T4TOTAL, T3FREE, THYROIDAB in the last 72 hours.  Invalid input(s): FREET3 ------------------------------------------------------------------------------------------------------------------ Recent Labs    06/30/19 0410  FERRITIN 396*    Coagulation profile Recent Labs  Lab 06/25/19 1530  INR 1.2    No results for input(s): DDIMER in the last 72 hours.  Cardiac Enzymes No results for input(s): CKMB, TROPONINI, MYOGLOBIN in the last 168 hours.  Invalid input(s): CK ------------------------------------------------------------------------------------------------------------------    Component Value Date/Time   BNP 203.0 (H) 06/25/2019 1530    Inpatient Medications  Scheduled Meds: . enoxaparin (LOVENOX) injection  30 mg Subcutaneous Q24H  . feeding supplement (GLUCERNA SHAKE)  237 mL Oral BID BM  . insulin aspart  0-20 Units Subcutaneous Q4H  . multivitamin with minerals  1 tablet Oral Daily  . nutrition supplement (JUVEN)  1 packet Oral BID BM   Continuous Infusions: . sodium chloride 75 mL/hr at 07/02/19 0614   PRN Meds:.acetaminophen **OR** acetaminophen, ondansetron **OR** ondansetron (ZOFRAN) IV  Micro Results Recent Results (from the past 240 hour(s))  Blood Culture (routine x 2)     Status: Abnormal   Collection Time: 06/25/19  1:54 PM   Specimen: BLOOD  Result Value Ref Range Status   Specimen Description   Final    BLOOD LEFT ANTECUBITAL Performed at [facility], [address] [place], [place] [address]    Special Requests   Final    BOTTLES DRAWN AEROBIC AND ANAEROBIC Blood Culture results may not be optimal due to an inadequate volume of blood received in culture bottles Performed at [facility], [address], [place], [place] [address]    Culture  Setup Time   Final    GRAM  POSITIVE COCCI AEROBIC BOTTLE ONLY CRITICAL RESULT CALLED TO, READ BACK BY AND VERIFIED WITH: DAVID BESANTI AT 0981 06/26/19.PMF Performed at [facility], [address] [place], [place] [address]    Culture (A)  Final    STAPHYLOCOCCUS SPECIES (COAGULASE NEGATIVE) THE SIGNIFICANCE OF ISOLATING THIS ORGANISM FROM A SINGLE SET OF BLOOD CULTURES WHEN MULTIPLE SETS ARE DRAWN IS UNCERTAIN. PLEASE NOTIFY THE MICROBIOLOGY DEPARTMENT WITHIN ONE WEEK IF SPECIATION AND SENSITIVITIES ARE REQUIRED. Performed at [facility], [place] [address], [place], [place] [address]    Report Status 06/27/2019 FINAL  Final  Blood Culture (routine x 2)     Status: None   Collection Time: 06/25/19  1:54 PM   Specimen: BLOOD  Result Value Ref Range Status   Specimen Description BLOOD BLOOD RIGHT FOREARM  Final   Special Requests   Final    BOTTLES DRAWN AEROBIC AND ANAEROBIC Blood Culture adequate volume   Culture   Final    NO GROWTH 5 DAYS Performed at [facility], [address] [place], [place] [address]    Report Status 06/30/2019 FINAL  Final  Urine culture     Status: Abnormal   Collection  Time: 06/25/19  7:07 PM   Specimen: Urine, Random  Result Value Ref Range Status   Specimen Description   Final    URINE, RANDOM Performed at [facility], [address], [place], [place] [address]    Special Requests   Final    NONE Performed at [facility], [address] [place], [place] [address]    Culture (A)  Final    <10,000 COLONIES/mL INSIGNIFICANT GROWTH Performed at [facility] [address], [place], [place] [address]    Report Status 06/27/2019 FINAL  Final    Radiology Reports NM Pulmonary Perfusion  Result Date: 06/27/2019 CLINICAL DATA:  Respiratory failure.  COVID positive. EXAM: NUCLEAR MEDICINE PERFUSION LUNG SCAN TECHNIQUE: Perfusion images were obtained in multiple projections after intravenous injection of radiopharmaceutical.  Ventilation scans intentionally deferred if perfusion scan and chest x-ray adequate for interpretation during COVID 19 epidemic. RADIOPHARMACEUTICALS:  3.2 mCi Tc-56m MAA IV COMPARISON:  Chest x-ray FINDINGS: No segmental or subsegmental perfusion defects to suggest pulmonary embolism. IMPRESSION: Negative pulmonary perfusion study for pulmonary embolism. Electronically Signed   By: Marijo Sanes M.D.   On: 06/27/2019 15:20   DG Chest Port 1 View  Result Date: 06/27/2019 CLINICAL DATA:  Respiratory failure. COVID-19 pneumonia. EXAM: PORTABLE CHEST 1 VIEW COMPARISON:  Chest x-ray 06/25/2019 FINDINGS: Heart size is normal. Patchy airspace opacities are seen bilaterally. Aeration is improving. Lung volumes have slightly improved. IMPRESSION: Improving aeration with persistent patchy bilateral airspace disease. Electronically Signed   By: San Morelle M.D.   On: 06/27/2019 10:07   DG Chest Port 1 View  Result Date: 06/25/2019 CLINICAL DATA:  Pt arrived via [place] from [facility] via [facility] w/ SOB EXAM: PORTABLE CHEST 1 VIEW COMPARISON:  01/19/2019 FINDINGS: Patchy bilateral airspace lung opacities are noted in the mid and lower lungs. No evidence of pulmonary edema. No pleural effusion or pneumothorax. Cardiac silhouette is normal in size. No mediastinal or hilar masses. Skeletal structures are grossly intact. IMPRESSION: Patchy bilateral mid and lower lung zone airspace opacities consistent with multifocal COVID-19 pneumonia. Electronically Signed   By: Lajean Manes M.D.   On: 06/25/2019 14:49    Time Spent in minutes 35   Noora Locascio M.D on 07/02/2019 at 2:47 PM  Between 7am to 7pm - Pager - [phone]  After 7pm go to [email or web address] - password [facility]  [facility] -  Office  [phone]

## 2019-07-02 NOTE — Progress Notes (Signed)
Inpatient Diabetes Program Recommendations  AACE/ADA: New Consensus Statement on Inpatient Glycemic Control (2015)  Target Ranges:  Prepandial:   less than 140 mg/dL      Peak postprandial:   less than 180 mg/dL (1-2 hours)      Critically ill patients:  140 - 180 mg/dL   Results for [name], [name] (MRN [id number]) as of 07/02/2019 10:02  Ref. Range 07/01/2019 16:39  Glucose Latest Ref Range: 70 - 99 mg/dL [employer])   Results for [name], [name] (MRN [id number]) as of 07/02/2019 10:02  Ref. Range 07/01/2019 16:25 07/01/2019 19:19 07/01/2019 20:27 07/01/2019 21:42 07/01/2019 23:17 07/02/2019 01:17 07/02/2019 02:51 07/02/2019 05:26 07/02/2019 07:45  Glucose-Capillary Latest Ref Range: 70 - 99 mg/dL >600 (HH) 506 (HH)  15 units NOVOLOG given at 6:24pm 414 (H) 408 (H) 437 (H)  5 units REGULAR insulin given at 12:28am 430 (H) 391 (H)  5 units REGULAR insulin given at 2:55am 385 (H)  20 units NOVOLOG  198 (H)  4 units NOVOLOG     Admit with: Hyperglycemia/ COVID+ (discharged 01/20 after admission for COVID pneumonia--diagnosed with COVID 06/14/2019)  History: DM, Dementia, CKD4  ALF DM Meds: Tradjenta 5 mg Daily  Current Orders: Novolog Resistant Correction Scale/ SSI (0-20 units) Q4 hours    Discharged to ALF on 01/20 with Prednisone taper.  Back to ED 01/21 with Glucose of 732 mg/dl.     MD- Note that no steroids are ordered at present.    Patient likely needs Insulin at ALF if he will continue on Prednisone as an outpatient.  For in-hospital, please consider the following:  Start Levemir 15 units Daily (0.2 units/kg)    --Will follow patient during hospitalization--  Wyn Quaker RN, MSN, CDE Diabetes Coordinator Inpatient Glycemic Control Team Team Pager: [phone] (8a-5p)

## 2019-07-03 DIAGNOSIS — L8940 Pressure ulcer of contiguous site of back, buttock and hip, unspecified stage: Secondary | ICD-10-CM

## 2019-07-03 DIAGNOSIS — I872 Venous insufficiency (chronic) (peripheral): Secondary | ICD-10-CM

## 2019-07-03 DIAGNOSIS — J9601 Acute respiratory failure with hypoxia: Secondary | ICD-10-CM | POA: Diagnosis present

## 2019-07-03 DIAGNOSIS — E1100 Type 2 diabetes mellitus with hyperosmolarity without nonketotic hyperglycemic-hyperosmolar coma (NKHHC): Principal | ICD-10-CM

## 2019-07-03 LAB — CREATININE, SERUM
Creatinine, Ser: 2.93 mg/dL — ABNORMAL HIGH (ref 0.61–1.24)
GFR calc Af Amer: 22 mL/min — ABNORMAL LOW (ref >60)
GFR calc non Af Amer: 19 mL/min — ABNORMAL LOW (ref >60)

## 2019-07-03 LAB — GLUCOSE, CAPILLARY
Glucose-Capillary: 103 mg/dL — ABNORMAL HIGH (ref 70–99)
Glucose-Capillary: 110 mg/dL — ABNORMAL HIGH (ref 70–99)
Glucose-Capillary: 155 mg/dL — ABNORMAL HIGH (ref 70–99)
Glucose-Capillary: 192 mg/dL — ABNORMAL HIGH (ref 70–99)
Glucose-Capillary: 240 mg/dL — ABNORMAL HIGH (ref 70–99)
Glucose-Capillary: 47 mg/dL — ABNORMAL LOW (ref 70–99)
Glucose-Capillary: 71 mg/dL (ref 70–99)

## 2019-07-03 LAB — MRSA PCR SCREENING: MRSA by PCR: NEGATIVE

## 2019-07-03 MED ORDER — INSULIN ASPART 100 UNIT/ML ~~LOC~~ SOLN: SUBCUTANEOUS | 3 refills | Status: DC

## 2019-07-03 MED ORDER — "INSULIN SYRINGE 28G X 1/2"" 0.5 ML MISC": 1.0000 "application " | Freq: Three times a day (TID) | 0 refills | Status: DC | PRN

## 2019-07-03 NOTE — Discharge Instructions (Signed)
Please check fingerstick blood glucose 4 times a day (with meals and at bedtime)  Hyperglycemic Hyperosmolar State Hyperglycemic hyperosmolar state is a serious condition in which you experience an extreme increase in your blood sugar (glucose) level. This makes your body become extremely dehydrated, which can be life-threatening. This condition is a result of uncontrolled or undiagnosed diabetes. It occurs most often in people who have type 2 diabetes (type 2 diabetes mellitus). Certain hormones (insulin and glucagon) control the level of glucose that is in the blood. Insulin lowers blood glucose, and glucagon increases blood glucose. Hyperglycemia can result from having too little insulin in the bloodstream, or from the body not responding normally to insulin. Normally, the body gets rid of excess glucose through urine. If you do not drink enough fluids, or if you drink fluids that contain sugar, your body cannot get rid of excess glucose. This can result in hyperglycemic hyperosmolar state. What are the causes? This condition may be caused by:  Infection.  Medicines that cause you to become dehydrated or cause you to lose fluid.  Certain illnesses.  Not taking your diabetes medicine.  New onset or diagnosis of diabetes.  Cardiovascular disease (CVD). What increases the risk? The following factors make you more likely to develop this condition:  Older age.  Poor management of diabetes.  Inability to eat or drink normally.  Heart failure.  Infection.  Surgery.  Illness. What are the signs or symptoms? Symptoms of this condition include:  Extreme or increased thirst. This symptom may gradually disappear.  Needing to urinate more often than usual.  Dry mouth.  Warm, dry skin that does not sweat even in high temperatures.  High fever.  Sleepiness or confusion.  Vision problems or vision loss.  Seeing, hearing, tasting, smelling, or feeling things that are not real  (hallucinations).  Weakness.  Weight loss.  Vomiting. How is this diagnosed? Hyperglycemic hyperosmolar state is diagnosed based on your medical history, your symptoms, and a blood test to measure your blood glucose level. How is this treated? This condition is treated in the hospital. The goals of treatment are:  To correct dehydration by replacing fluids that you have lost. Fluids will be given through an IV tube.  To improve blood sugar levels using insulin or other medicines as needed.  To treat the cause of hyperglycemia, such as an infection, illness, or newly diagnosed diabetes. Follow these instructions at home: General instructions  Take over-the-counter and prescription medicines only as told by your health care provider.  Do not use any products that contain nicotine or tobacco, such as cigarettes and e-cigarettes. If you need help quitting, ask your health care provider.  Limit alcohol intake to no more than 1 drink a day for nonpregnant women and 2 drinks a day for men. One drink equals 12 oz of beer, 5 oz of wine, or 1 oz of hard liquor.  Stay hydrated, especially when you exercise, when you get sick, or when you spend time in hot temperatures.  Learn to manage stress. If you need help with this, ask your health care provider.  Keep all follow-up visits as told by your health care provider. This is important. Eating and drinking   Maintain a healthy weight.  Exercise regularly, as directed by your health care provider.  Eat healthy foods, such as: ? Lean proteins. ? Complex carbohydrates. ? Fresh fruits and vegetables. ? Low-fat dairy products. ? Healthy fats.  Drink enough fluid to keep your urine clear or  pale yellow. If You Have Diabetes:   Make sure you know the early signs and symptoms of hyperglycemia.  Follow your diabetes management plan, as told by your health care provider. Make sure you: ? Take your insulin and medicines as  directed. ? Follow your exercise plan. ? Follow your meal plan. Eat on time, and do not skip meals. ? Check your blood glucose as often as directed. Make sure to check your blood glucose before and after exercise. If you exercise longer or in a different way than usual, check your blood glucose more often. ? Follow your sick day plan whenever you cannot eat or drink normally. Make this plan in advance with your health care provider.  Share your diabetes management plan with people in your workplace, school, and household.  Check your urine for ketones when you are ill and as often as told by your health care provider.  Carry a medical alert card or wear medical alert jewelry. Contact a health care provider if:  You cannot eat or drink without throwing up.  You develop a fever. Get help right away if:  You develop symptoms of hyperglycemic hyperosmolar state. These symptoms may represent a serious problem that is an emergency. Do not wait to see if the symptoms will go away. Get medical help right away. Call your local emergency services (911 in the [place] Do not drive yourself to the hospital. Summary  Hyperglycemic hyperosmolar state is a serious condition in which you experience an extreme increase in your blood sugar (glucose) level. This makes your body become extremely dehydrated, which can be life-threatening.  This condition is a result of uncontrolled or undiagnosed diabetes. It occurs most often in people who have type 2 diabetes (type 2 diabetes mellitus).  This condition is treated in the hospital. Treatment may include fluids given through an IV tube and other medicines.  Make sure you know the early signs and symptoms of hyperglycemia.  Follow your diabetes management plan, as told by your health care provider. This information is not intended to replace advice given to you by your health care provider. Make sure you discuss any questions you have with your health care  provider. Document Revised: 05/20/2016 Document Reviewed: 05/20/2016 Elsevier Patient Education  2020 [employer].

## 2019-07-03 NOTE — Progress Notes (Signed)
Updated [name] about pt condition. She seems very involved in his care. She is hoping that social work will be getting him a hospital bed at his facility so can return home ASAP.

## 2019-07-03 NOTE — Discharge Summary (Addendum)
Physician Discharge Summary  [name] VQQ:[id number] DOB: 1932/03/30 DOA: 07/01/2019  PCP: [name], MD  Admit date: 07/01/2019 Discharge date: 07/05/2019  Admitted From: Assisted living Disposition: Assisted living  Recommendations for Outpatient Follow-up:  Return to assisted living with outpatient follow-up. Patient needs blood glucose monitoring 4 times a day (with meals and at bedtime).  Home Health: PT and RN Equipment/Devices: Wheelchair, hospital bed  Discharge Condition: Fair CODE STATUS: DNR Diet recommendation: Carb modified    Discharge Diagnoses:  Principal Problem:   Hyperosmolar non-ketotic state in patient with type 2 diabetes mellitus ([place])  Active Problems:   Acute respiratory failure with hypoxia ([place])   HTN (hypertension)   CKD (chronic kidney disease), stage IV (HCC)   Hyperglycemia due to type 2 diabetes mellitus (HCC)   History of COVID-19 Generalized weakness  Brief narrative/HPI 86 year old male with history of dementia, uncontrolled diabetes mellitus type 2, CKD stage IV who was discharged on 1/5:20 days hospitalization for COVID-19 pneumonia and respiratory failure was sent from the assisted living for hyperglycemia.  His blood glucose was 531.  Patient had completed 5-day course of remdesivir and received dexamethasone and discharged on prednisone taper. EMS found his blood glucose to be 531 and oxygen saturation of 91% on room air, placed on 2 L via nasal cannula. In the ED he was maintaining sats in the mid 90s on 2 L, afebrile.  Blood glucose of 732 and creatinine of 3.67 (at baseline).  He was given 15 units IV insulin along with IV fluids.  Admitted for further management.  Hospital course  Principal problem   Hyperosmolar non-ketotic state in patient with type 2 diabetes mellitus ([place]) Likely triggered by steroid use and recent infection. Prednisone has been discontinued.  Resolved with IV fluids and IV insulin.  Stable on sliding  scale coverage except for one episode of low blood glucose of 50 without symptoms..  Continue Tradjenta.  Assisted living is unable to provide sliding scale coverage.  Will order for CBG monitoring 4 times a day.   Active problems Acute respiratory failure with hypoxia Possibly in the setting of recent COVID-19 infection.    Now maintaining sats on room air both at rest and ambulation.  Need to assess O2 sat at the facility. Completed 5 days of remdesivir recently.    Further steroid discontinued  Acute metabolic encephalopathy Secondary to hypoxia and hyperglycemia.  Now resolved.    CKD (chronic kidney disease), stage IV ([place]) Renal function at baseline.  Avoid nephrotoxins.  Generalized weakness Seen by PT and recommends home health with 24-hour supervision versus SNF.  Patient has difficulty ambulating to the bathroom.  Ordered for wheelchair.  Also has pressure injury to the buttocks (stage II).  Wound care per nursing.  Will benefit from hospital bed.  Constipation Given enema prior to discharge.  Ordered MiraLAX.  Daughter plans on getting 24-hour care at the facility.  Junctional rhythm with prolonged QTC. Seen on telemetry.    EKG unremarkable.  Electrolytes normal.  No further episode.  Leukocytosis Suspect due to recent steroid use.  No signs of active infection.  Blood culture grew coag negative staph aureus (appears contaminant).    Family Communication  :  Spoke in detail with daughter on the phone  Disposition Plan  : Return to ALF Consults  : None  Procedures  : None  Discharge Instructions  Discharge Instructions    For home use only DME 4 wheeled rolling walker with seat   Complete by:  As directed    Patient needs a walker to treat with the following condition:  Generalized weakness Lower extremity weakness     For home use only DME Hospital bed   Complete by: As directed    Length of Need: 12 Months   The above medical condition requires:  Patient requires the ability to reposition frequently   Head must be elevated greater than: 30 degrees   Bed type: Semi-electric   Support Surface: Gel Overlay     Allergies as of 07/03/2019      Reactions   Meperidine Other (See Comments)   Severe bradycardia Severe bradycardia   Other    Darvocet-N Lowers heart rate   Penicillins Rash      Medication List    STOP taking these medications   predniSONE 10 MG (21) Tbpk tablet Commonly known as: STERAPRED UNI-PAK 21 TAB   predniSONE 10 MG tablet Commonly known as: DELTASONE     TAKE these medications   acetaminophen 500 MG tablet Commonly known as: TYLENOL Take 500 mg by mouth every 12 (twelve) hours as needed for mild pain.   amLODipine 10 MG tablet Commonly known as: NORVASC Take 10 mg by mouth daily.   ascorbic acid 500 MG tablet Commonly known as: VITAMIN C Take 500 mg by mouth 2 (two) times daily.   aspirin 81 MG EC tablet Take 1 tablet (81 mg total) by mouth daily.   B-Complex Tabs Take 1 tablet by mouth daily.   calcitRIOL 0.25 MCG capsule Commonly known as: ROCALTROL Take 0.25 mcg by mouth daily.   calcium carbonate 1500 (600 Ca) MG Tabs tablet Commonly known as: OSCAL Take 600 mg of elemental calcium by mouth 2 (two) times daily.   denosumab 60 MG/ML Sosy injection Commonly known as: PROLIA Inject 60 mg into the skin every 6 (six) months.   feeding supplement (NEPRO CARB STEADY) Liqd Take 237 mLs by mouth 3 (three) times daily with meals.   ferrous sulfate 325 (65 FE) MG tablet Take 325 mg by mouth daily with breakfast.   furosemide 20 MG tablet Commonly known as: LASIX Take 10-40 mg by mouth 2 (two) times daily. Take 2 tablets (40mg ) by mouth every morning and take  tablet (10mg ) by mouth at lunchtime       Jalyn 0.5-0.4 MG Caps Generic drug: Dutasteride-Tamsulosin HCl Take 1 capsule by mouth daily.   losartan 25 MG tablet Commonly known as: COZAAR Take 0.5 tablets (12.5 mg total) by  mouth 2 (two) times daily.   multivitamin capsule Take 1 capsule by mouth daily.   potassium chloride 10 MEQ tablet Commonly known as: KLOR-CON Take 10 mEq by mouth daily.   sodium bicarbonate 650 MG tablet Take 650 mg by mouth 2 (two) times daily.   Tradjenta 5 MG Tabs tablet Generic drug: linagliptin Take 5 mg by mouth daily.   triamcinolone ointment 0.1 % Commonly known as: KENALOG Apply 1 application topically 2 (two) times daily. (apply to arms and legs)   Vitamin D 50 MCG (2000 UT) tablet Take 2,000 Units by mouth daily.   zinc gluconate 50 MG tablet Take 100 mg by mouth daily.            Durable Medical Equipment  (From admission, onward)         Start     Ordered   07/03/19 0000  For home use only DME 4 wheeled rolling walker with seat    Question Answer Comment  Patient needs  a walker to treat with the following condition Generalized weakness   Patient needs a walker to treat with the following condition Lower extremity weakness      07/03/19 1125   07/03/19 0000  For home use only DME Hospital bed    Question Answer Comment  Length of Need 12 Months   The above medical condition requires: Patient requires the ability to reposition frequently   Head must be elevated greater than: 30 degrees   Bed type Semi-electric   Support Surface: Gel Overlay      07/03/19 1125         Follow-up Information    [name], MD Follow up in 1 week(s).   Specialty: Family Medicine Contact information: [address] [place] [address] [phone]          Allergies  Allergen Reactions  . Meperidine Other (See Comments)    Severe bradycardia Severe bradycardia   . Other     Darvocet-N Lowers heart rate  . Penicillins Rash       Procedures/Studies: NM Pulmonary Perfusion  Result Date: 06/27/2019 CLINICAL DATA:  Respiratory failure.  COVID positive. EXAM: NUCLEAR MEDICINE PERFUSION LUNG SCAN TECHNIQUE: Perfusion  images were obtained in multiple projections after intravenous injection of radiopharmaceutical. Ventilation scans intentionally deferred if perfusion scan and chest x-ray adequate for interpretation during COVID 19 epidemic. RADIOPHARMACEUTICALS:  3.2 mCi Tc-92m MAA IV COMPARISON:  Chest x-ray FINDINGS: No segmental or subsegmental perfusion defects to suggest pulmonary embolism. IMPRESSION: Negative pulmonary perfusion study for pulmonary embolism. Electronically Signed   By: Marijo Sanes M.D.   On: 06/27/2019 15:20   DG Chest Port 1 View  Result Date: 06/27/2019 CLINICAL DATA:  Respiratory failure. COVID-19 pneumonia. EXAM: PORTABLE CHEST 1 VIEW COMPARISON:  Chest x-ray 06/25/2019 FINDINGS: Heart size is normal. Patchy airspace opacities are seen bilaterally. Aeration is improving. Lung volumes have slightly improved. IMPRESSION: Improving aeration with persistent patchy bilateral airspace disease. Electronically Signed   By: San Morelle M.D.   On: 06/27/2019 10:07   DG Chest Port 1 View  Result Date: 06/25/2019 CLINICAL DATA:  Pt arrived via [place] from [facility] via [facility] w/ SOB EXAM: PORTABLE CHEST 1 VIEW COMPARISON:  01/19/2019 FINDINGS: Patchy bilateral airspace lung opacities are noted in the mid and lower lungs. No evidence of pulmonary edema. No pleural effusion or pneumothorax. Cardiac silhouette is normal in size. No mediastinal or hilar masses. Skeletal structures are grossly intact. IMPRESSION: Patchy bilateral mid and lower lung zone airspace opacities consistent with multifocal COVID-19 pneumonia. Electronically Signed   By: Lajean Manes M.D.   On: 06/25/2019 14:49       Subjective: Elderly male, fatigued, not in distress.  Discharge Exam: Vitals:   07/03/19 0800 07/03/19 0855  BP: (!) 157/58 (!) 167/69  Pulse: 79 92  Resp: 16 (!) 22  Temp:  98.7 F (37.1 C)  SpO2: 93% 94%   Vitals:   07/03/19 0400 07/03/19 0700 07/03/19 0800 07/03/19 0855  BP:  133/66  (!) 157/58 (!) 167/69  Pulse: 86 82 79 92  Resp: 16 18 16  (!) 22  Temp:    98.7 F (37.1 C)  TempSrc:    Axillary  SpO2: 95% 95% 93% 94%  Weight:      Height:        General: Elderly male not in distress HEENT: Moist mucosa, supple neck Chest: Clear CVS: Normal S1-S2, no murmurs GI: Soft, nondistended, nontender Musculoskeletal: Warm,  no edema, stage II pressure ulcer over the buttock CNS: Alert and awake, poorly communicative    The results of significant diagnostics from this hospitalization (including imaging, microbiology, ancillary and laboratory) are listed below for reference.     Microbiology: Recent Results (from the past 240 hour(s))  Blood Culture (routine x 2)     Status: Abnormal   Collection Time: 06/25/19  1:54 PM   Specimen: BLOOD  Result Value Ref Range Status   Specimen Description   Final    BLOOD LEFT ANTECUBITAL Performed at [facility], [address] [place], [place] [address]    Special Requests   Final    BOTTLES DRAWN AEROBIC AND ANAEROBIC Blood Culture results may not be optimal due to an inadequate volume of blood received in culture bottles Performed at [facility], [address], [place], [place] [address]    Culture  Setup Time   Final    GRAM POSITIVE COCCI AEROBIC BOTTLE ONLY CRITICAL RESULT CALLED TO, READ BACK BY AND VERIFIED WITH: DAVID BESANTI AT 0865 06/26/19.PMF Performed at [facility], [address] [place], [place] [address]    Culture (A)  Final    STAPHYLOCOCCUS SPECIES (COAGULASE NEGATIVE) THE SIGNIFICANCE OF ISOLATING THIS ORGANISM FROM A SINGLE SET OF BLOOD CULTURES WHEN MULTIPLE SETS ARE DRAWN IS UNCERTAIN. PLEASE NOTIFY THE MICROBIOLOGY DEPARTMENT WITHIN ONE WEEK IF SPECIATION AND SENSITIVITIES ARE REQUIRED. Performed at [facility], [place] [address], [place], [place] [address]    Report Status 06/27/2019 FINAL  Final  Blood Culture (routine x 2)     Status: None    Collection Time: 06/25/19  1:54 PM   Specimen: BLOOD  Result Value Ref Range Status   Specimen Description BLOOD BLOOD RIGHT FOREARM  Final   Special Requests   Final    BOTTLES DRAWN AEROBIC AND ANAEROBIC Blood Culture adequate volume   Culture   Final    NO GROWTH 5 DAYS Performed at  [facility], [address] [place], [place] [address]    Report Status 06/30/2019 FINAL  Final  Urine culture     Status: Abnormal   Collection Time: 06/25/19  7:07 PM   Specimen: Urine, Random  Result Value Ref Range Status   Specimen Description   Final    URINE, RANDOM Performed at [facility], [address] [place], [place] [address]    Special Requests   Final    NONE Performed at [facility], [address] [place], [place] [address]    Culture (A)  Final    <10,000 COLONIES/mL INSIGNIFICANT GROWTH Performed at [facility], [place] [address] [place], [place] [address]    Report Status 06/27/2019 FINAL  Final  MRSA PCR Screening     Status: None   Collection Time: 07/03/19  6:55 AM   Specimen: Nasal Mucosa; Nasopharyngeal  Result Value Ref Range Status   MRSA by PCR NEGATIVE NEGATIVE Final    Comment:        The GeneXpert MRSA Assay (FDA approved for NASAL specimens only), is one component of a comprehensive MRSA colonization surveillance program. It is not intended to diagnose MRSA infection nor to guide or monitor treatment for MRSA infections. Performed at [facility], [address] [place],  [address]      Labs: BNP (last 3 results) Recent Labs    06/25/19 1530  BNP 595.6*   Basic Metabolic Panel: Recent Labs  Lab 06/27/19  2694 06/27/19 0407 06/28/19 0358 06/28/19 0358 06/29/19 0527 06/30/19 0410 07/01/19 1639 07/02/19 1517 07/03/19 0645  NA 136   < > 143  --  142 140 131* 143  --   K 4.3   < > 3.9  --  4.0 4.2 5.1 4.7  --   CL 109   < > 114*  --  111 112* 102 115*  --   CO2 12*   < > 17*  --   16* 17* 16* 16*  --   GLUCOSE 315*   < > 56*  --  65* 76 732* 105*  --   BUN 113*   < > 111*  --  100* 98* 94* 83*  --   CREATININE 4.54*   < > 4.32*   < > 3.87* 3.49* 3.62* 3.32* 2.93*  CALCIUM 7.3*   < > 7.3*  --  7.4* 7.5* 8.0* 7.9*  --   MG 2.3  --  2.5*  --  2.5* 2.5*  --  2.4  --    < > = values in this interval not displayed.   Liver Function Tests: Recent Labs  Lab 06/27/19 0407 06/28/19 0358 06/29/19 0527 06/30/19 0410  AST 64* 43* 55* 47*  ALT 38 35 45* 44  ALKPHOS 44 45 53 52  BILITOT 0.9 0.6 0.8 0.5  PROT 5.5* 5.6* 6.0* 5.7*  ALBUMIN 2.8* 2.6* 2.8* 2.7*   No results for input(s): LIPASE, AMYLASE in the last 168 hours. No results for input(s): AMMONIA in the last 168 hours. CBC: Recent Labs  Lab 06/27/19 0407 06/28/19 0358 06/29/19 0527 06/30/19 0410 07/01/19 1639  WBC 21.9* 18.2* 14.2* 14.4* 16.9*  NEUTROABS 20.7* 17.1* 13.0* 12.3* 14.9*  HGB 9.4* 9.6* 9.8* 9.5* 9.9*  HCT 28.1* 27.6* 28.7* 28.2* 30.4*  MCV 90.9 87.9 88.6 89.5 91.6  PLT 236 260 251 245 256   Cardiac Enzymes: No results for input(s): CKTOTAL, CKMB, CKMBINDEX, TROPONINI in the last 168 hours. BNP: Invalid input(s): POCBNP CBG: Recent Labs  Lab 07/02/19 1651 07/02/19 2036 07/03/19 0137 07/03/19 0403 07/03/19 0810  GLUCAP 166* 243* 71 110* 155*   D-Dimer No results for input(s): DDIMER in the last 72 hours. Hgb A1c No results for input(s): HGBA1C in the last 72 hours. Lipid Profile No results for input(s): CHOL, HDL, LDLCALC, TRIG, CHOLHDL, LDLDIRECT in the last 72 hours. Thyroid function studies No results for input(s): TSH, T4TOTAL, T3FREE, THYROIDAB in the last 72 hours.  Invalid input(s): FREET3 Anemia work up No results for input(s): VITAMINB12, FOLATE, FERRITIN, TIBC, IRON, RETICCTPCT in the last 72 hours. Urinalysis    Component Value Date/Time   COLORURINE STRAW (A) 07/01/2019 1639   APPEARANCEUR CLEAR (A) 07/01/2019 1639   LABSPEC 1.008 07/01/2019 1639   PHURINE  5.0 07/01/2019 1639   GLUCOSEU >=500 (A) 07/01/2019 1639   HGBUR NEGATIVE 07/01/2019 1639   BILIRUBINUR NEGATIVE 07/01/2019 1639   KETONESUR NEGATIVE 07/01/2019 1639   PROTEINUR NEGATIVE 07/01/2019 1639   NITRITE NEGATIVE 07/01/2019 1639   LEUKOCYTESUR NEGATIVE 07/01/2019 1639   Sepsis Labs Invalid input(s): PROCALCITONIN,  WBC,  LACTICIDVEN Microbiology Recent Results (from the past 240 hour(s))  Blood Culture (routine x 2)     Status: Abnormal   Collection Time: 06/25/19  1:54 PM   Specimen: BLOOD  Result Value Ref Range Status   Specimen Description   Final    BLOOD LEFT ANTECUBITAL Performed at [facility], [address], [place], [place] [address]  Special Requests   Final    BOTTLES DRAWN AEROBIC AND ANAEROBIC Blood Culture results may not be optimal due to an inadequate volume of blood received in culture bottles Performed at [facility], [address] [place], [place] [address]    Culture  Setup Time   Final    GRAM POSITIVE COCCI AEROBIC BOTTLE ONLY CRITICAL RESULT CALLED TO, READ BACK BY AND VERIFIED WITH: DAVID BESANTI AT 6599 06/26/19.PMF Performed at [facility], [address] [place], [place] [address]    Culture (A)  Final    STAPHYLOCOCCUS SPECIES (COAGULASE NEGATIVE) THE SIGNIFICANCE OF ISOLATING THIS ORGANISM FROM A SINGLE SET OF BLOOD CULTURES WHEN MULTIPLE SETS ARE DRAWN IS UNCERTAIN. PLEASE NOTIFY THE MICROBIOLOGY DEPARTMENT WITHIN ONE WEEK IF SPECIATION AND SENSITIVITIES ARE REQUIRED. Performed at [facility], [place] [address] [place], [place] [address]    Report Status 06/27/2019 FINAL  Final  Blood Culture (routine x 2)     Status: None   Collection Time: 06/25/19  1:54 PM   Specimen: BLOOD  Result Value Ref Range Status   Specimen Description BLOOD BLOOD RIGHT FOREARM  Final   Special Requests   Final    BOTTLES DRAWN AEROBIC AND ANAEROBIC Blood Culture adequate volume   Culture   Final    NO GROWTH 5  DAYS Performed at [facility], [address] [place], [place] [address]    Report Status 06/30/2019 FINAL  Final  Urine culture     Status: Abnormal   Collection Time: 06/25/19  7:07 PM   Specimen: Urine, Random  Result Value Ref Range Status   Specimen Description   Final    URINE, RANDOM Performed at [facility], [address], [place], [place] [address]    Special Requests   Final    NONE Performed at [facility], [address], [place], [place] [address]    Culture (A)  Final    <10,000 COLONIES/mL INSIGNIFICANT GROWTH Performed at [facility], [place] [address], [place], [place] [address]    Report Status 06/27/2019 FINAL  Final  MRSA PCR Screening     Status: None   Collection Time: 07/03/19  6:55 AM   Specimen: Nasal Mucosa; Nasopharyngeal  Result Value Ref Range Status   MRSA by PCR NEGATIVE NEGATIVE Final    Comment:        The GeneXpert MRSA Assay (FDA approved for NASAL specimens only), is one component of a comprehensive MRSA colonization surveillance program. It is not intended to diagnose MRSA infection nor to guide or monitor treatment for MRSA infections. Performed at [facility], [address] [place], [place] [address]      Time coordinating discharge: 35 minutes  SIGNED:   Louellen Molder, MD  [facility] 07/03/2019, 11:36 AM Pager   If 7PM-7AM, please contact night-coverage [email or web address] Password [email or web address]

## 2019-07-04 DIAGNOSIS — E0865 Diabetes mellitus due to underlying condition with hyperglycemia: Secondary | ICD-10-CM

## 2019-07-04 DIAGNOSIS — N184 Chronic kidney disease, stage 4 (severe): Secondary | ICD-10-CM

## 2019-07-04 LAB — GLUCOSE, CAPILLARY
Glucose-Capillary: 160 mg/dL — ABNORMAL HIGH (ref 70–99)
Glucose-Capillary: 136 mg/dL — ABNORMAL HIGH (ref 70–99)
Glucose-Capillary: 122 mg/dL — ABNORMAL HIGH (ref 70–99)
Glucose-Capillary: 160 mg/dL — ABNORMAL HIGH (ref 70–99)
Glucose-Capillary: 123 mg/dL — ABNORMAL HIGH (ref 70–99)
Glucose-Capillary: 233 mg/dL — ABNORMAL HIGH (ref 70–99)
Glucose-Capillary: 102 mg/dL — ABNORMAL HIGH (ref 70–99)

## 2019-07-04 MED ORDER — LABETALOL HCL 5 MG/ML IV SOLN
10.0000 mg | Freq: Four times a day (QID) | INTRAVENOUS | Status: DC | PRN
  Administered 2019-07-04: 05:00:00 10 mg via INTRAVENOUS
  Filled 2019-07-04: qty 4

## 2019-07-04 NOTE — Social Work (Addendum)
TOC social worker received consult for [facility].  In progress.   0926  [facility] contacted.  SW waiting for call from oncall RN to compete order for [facility]  Beginning search for DME hospital bed and wheelchair.   9622 Received call from [facility].  RN and PT services will begin with [facility] on 07/04/2018.   0950 SW talked to pt's daughter who reported that she is going to transport her father Back to [facility] once hospital DME is ordered.   Berenice Bouton, MSW, LCSW  [phone] 8am-4pm

## 2019-07-04 NOTE — Progress Notes (Signed)
PROGRESS NOTE                                                                                                                                                                                                             Patient Demographics:    [name], is a 86 y.o. male, DOB - 17-Feb-1932, ZOX:[id number]  Admit date - 07/01/2019   Admitting Physician Louellen Molder, MD  Outpatient Primary MD for the patient is [name], MD  LOS - 2    Chief Complaint  Patient presents with  . Pneumonia  . Hyperglycemia       Brief Narrative   86 year old male with history of dementia, uncontrolled diabetes mellitus type 2, CKD stage IV who was discharged on 1/5:20 days hospitalization for COVID-19 pneumonia and respiratory failure was sent from the assisted living for hyperglycemia. His blood glucose was 531. Patient had completed 5-day course of remdesivir and received dexamethasone and discharged on prednisone taper. EMS found his blood glucose to be 531 and oxygen saturation of 91% on room air, placed on 2 L via nasal cannula. In the ED he was maintaining sats in the mid 90s on 2 L, afebrile. Blood glucose of 732 and creatinine of 3.67 (at baseline). He was given 15 units IV insulin along with IV fluids. Admitted for further management.   Subjective:   CBG remained stable.  No overnight events.   Assessment  & Plan :   Hyperosmolar nonketotic state in type 2 diabetes mellitus Secondary to steroid use and recent COVID-19 infection.  Off steroid now.  CBG remained stable in the low to mid 100s.  Continue Tradjenta upon discharge.  Recommend CBG monitoring 4 times a day.  (Assisted living unable to do sliding scale coverage).  Does not need scheduled insulin.  Acute respiratory failure with hypoxia Secondary to COVID-19 infection.  Now stable on room air and ambulation.    Chronic kidney disease stage IV Stable at  baseline.       Code Status :DNR  Family Communication  : Daughter-in-law updated  Disposition Plan  : Return to assisted living once equipment is delivered (wheelchair, hospital bed)  Lab Results  Component Value Date   PLT 256 07/01/2019    Antibiotics  :   Anti-infectives (From admission, onward)   None  Objective:   Vitals:   07/04/19 0600 07/04/19 0800 07/04/19 0846 07/04/19 0900  BP: 140/71 (!) 180/93 (!) 179/84 (!) 143/65  Pulse: 79 87  80  Resp: 19 (!) 25  16  Temp:   98.2 F (36.8 C)   TempSrc:   Oral   SpO2: 97% 98%  95%  Weight:      Height:        Wt Readings from Last 3 Encounters:  07/01/19 74 kg  06/26/19 74.2 kg  01/19/19 64.4 kg     Intake/Output Summary (Last 24 hours) at 07/04/2019 1127 Last data filed at 07/04/2019 1008 Gross per 24 hour  Intake 1967.09 ml  Output 2100 ml  Net -132.91 ml     Physical Exam  Gen: not in distress, fatigue HEENT:  moist mucosa, supple neck Chest: clear b/l, CVS: N S1&S2, no murmurs GI: soft, NT, ND, Musculoskeletal: warm, no edema    Data Review:    CBC Recent Labs  Lab 06/28/19 0358 06/29/19 0527 06/30/19 0410 07/01/19 1639  WBC 18.2* 14.2* 14.4* 16.9*  HGB 9.6* 9.8* 9.5* 9.9*  HCT 27.6* 28.7* 28.2* 30.4*  PLT 260 251 245 256  MCV 87.9 88.6 89.5 91.6  MCH 30.6 30.2 30.2 29.8  MCHC 34.8 34.1 33.7 32.6  RDW 12.2 12.3 12.6 12.9  LYMPHSABS 0.4* 0.4* 0.5* 0.5*  MONOABS 0.4 0.2 0.6 0.6  EOSABS 0.0 0.0 0.0 0.0  BASOSABS 0.0 0.0 0.1 0.1    Chemistries  Recent Labs  Lab 06/28/19 0358 06/28/19 0358 06/29/19 0527 06/30/19 0410 07/01/19 1639 07/02/19 1517 07/03/19 0645  NA 143  --  142 140 131* 143  --   K 3.9  --  4.0 4.2 5.1 4.7  --   CL 114*  --  111 112* 102 115*  --   CO2 17*  --  16* 17* 16* 16*  --   GLUCOSE 56*  --  65* 76 732* 105*  --   BUN 111*  --  100* 98* 94* 83*  --   CREATININE 4.32*   < > 3.87* 3.49* 3.62* 3.32* 2.93*  CALCIUM 7.3*  --  7.4* 7.5* 8.0*  7.9*  --   MG 2.5*  --  2.5* 2.5*  --  2.4  --   AST 43*  --  55* 47*  --   --   --   ALT 35  --  45* 44  --   --   --   ALKPHOS 45  --  53 52  --   --   --   BILITOT 0.6  --  0.8 0.5  --   --   --    < > = values in this interval not displayed.   ------------------------------------------------------------------------------------------------------------------ No results for input(s): CHOL, HDL, LDLCALC, TRIG, CHOLHDL, LDLDIRECT in the last 72 hours.  Lab Results  Component Value Date   HGBA1C 9.1 (H) 06/26/2019   ------------------------------------------------------------------------------------------------------------------ No results for input(s): TSH, T4TOTAL, T3FREE, THYROIDAB in the last 72 hours.  Invalid input(s): FREET3 ------------------------------------------------------------------------------------------------------------------ No results for input(s): VITAMINB12, FOLATE, FERRITIN, TIBC, IRON, RETICCTPCT in the last 72 hours.  Coagulation profile No results for input(s): INR, PROTIME in the last 168 hours.  No results for input(s): DDIMER in the last 72 hours.  Cardiac Enzymes No results for input(s): CKMB, TROPONINI, MYOGLOBIN in the last 168 hours.  Invalid input(s): CK ------------------------------------------------------------------------------------------------------------------    Component Value Date/Time   BNP 203.0 (H) 06/25/2019 1530  Inpatient Medications  Scheduled Meds: . enoxaparin (LOVENOX) injection  30 mg Subcutaneous Q24H  . feeding supplement (GLUCERNA SHAKE)  237 mL Oral BID BM  . insulin aspart  0-20 Units Subcutaneous Q4H  . multivitamin with minerals  1 tablet Oral Daily  . nutrition supplement (JUVEN)  1 packet Oral BID BM   Continuous Infusions: . sodium chloride 75 mL/hr at 07/04/19 0622   PRN Meds:.acetaminophen **OR** acetaminophen, labetalol, ondansetron **OR** ondansetron (ZOFRAN) IV  Micro Results Recent Results  (from the past 240 hour(s))  Blood Culture (routine x 2)     Status: Abnormal   Collection Time: 06/25/19  1:54 PM   Specimen: BLOOD  Result Value Ref Range Status   Specimen Description   Final    BLOOD LEFT ANTECUBITAL Performed at [facility], [address] [place], [place] [address]    Special Requests   Final    BOTTLES DRAWN AEROBIC AND ANAEROBIC Blood Culture results may not be optimal due to an inadequate volume of blood received in culture bottles Performed at [facility], [address], [place], [place] [address]    Culture  Setup Time   Final    GRAM POSITIVE COCCI AEROBIC BOTTLE ONLY CRITICAL RESULT CALLED TO, READ BACK BY AND VERIFIED WITH: DAVID BESANTI AT 0981 06/26/19.PMF Performed at [facility], [address] [place], [place] [address]    Culture (A)  Final    STAPHYLOCOCCUS SPECIES (COAGULASE NEGATIVE) THE SIGNIFICANCE OF ISOLATING THIS ORGANISM FROM A SINGLE SET OF BLOOD CULTURES WHEN MULTIPLE SETS ARE DRAWN IS UNCERTAIN. PLEASE NOTIFY THE MICROBIOLOGY DEPARTMENT WITHIN ONE WEEK IF SPECIATION AND SENSITIVITIES ARE REQUIRED. Performed at [facility], [place] [address], [place], [place] [address]    Report Status 06/27/2019 FINAL  Final  Blood Culture (routine x 2)     Status: None   Collection Time: 06/25/19  1:54 PM   Specimen: BLOOD  Result Value Ref Range Status   Specimen Description BLOOD BLOOD RIGHT FOREARM  Final   Special Requests   Final    BOTTLES DRAWN AEROBIC AND ANAEROBIC Blood Culture adequate volume   Culture   Final    NO GROWTH 5 DAYS Performed at [facility], [address] [place], [place] [address]    Report Status 06/30/2019 FINAL  Final  Urine culture     Status: Abnormal   Collection Time: 06/25/19  7:07 PM   Specimen: Urine, Random  Result Value Ref Range Status   Specimen Description   Final    URINE, RANDOM Performed at [facility], [address] [place],  [place] [address]    Special Requests   Final    NONE Performed at [facility], [address], [place], [place] [address]    Culture (A)  Final    <10,000 COLONIES/mL INSIGNIFICANT GROWTH Performed at [facility], [place] [address], [place], [place] [address]    Report Status 06/27/2019 FINAL  Final  MRSA PCR Screening     Status: None   Collection Time: 07/03/19  6:55 AM   Specimen: Nasal Mucosa; Nasopharyngeal  Result Value Ref Range Status   MRSA by PCR NEGATIVE NEGATIVE Final    Comment:        The GeneXpert MRSA Assay (FDA approved for NASAL specimens only), is one component of a comprehensive MRSA colonization surveillance program. It is not intended to diagnose MRSA infection nor to guide or monitor treatment for MRSA infections.  Performed at [facility], [address], [place], [place] [address]     Radiology Reports NM Pulmonary Perfusion  Result Date: 06/27/2019 CLINICAL DATA:  Respiratory failure.  COVID positive. EXAM: NUCLEAR MEDICINE PERFUSION LUNG SCAN TECHNIQUE: Perfusion images were obtained in multiple projections after intravenous injection of radiopharmaceutical. Ventilation scans intentionally deferred if perfusion scan and chest x-ray adequate for interpretation during COVID 19 epidemic. RADIOPHARMACEUTICALS:  3.2 mCi Tc-6m MAA IV COMPARISON:  Chest x-ray FINDINGS: No segmental or subsegmental perfusion defects to suggest pulmonary embolism. IMPRESSION: Negative pulmonary perfusion study for pulmonary embolism. Electronically Signed   By: Marijo Sanes M.D.   On: 06/27/2019 15:20   DG Chest Port 1 View  Result Date: 06/27/2019 CLINICAL DATA:  Respiratory failure. COVID-19 pneumonia. EXAM: PORTABLE CHEST 1 VIEW COMPARISON:  Chest x-ray 06/25/2019 FINDINGS: Heart size is normal. Patchy airspace opacities are seen bilaterally. Aeration is improving. Lung volumes have slightly improved. IMPRESSION: Improving aeration with persistent patchy  bilateral airspace disease. Electronically Signed   By: San Morelle M.D.   On: 06/27/2019 10:07   DG Chest Port 1 View  Result Date: 06/25/2019 CLINICAL DATA:  Pt arrived via [place] from [facility] via [facility] w/ SOB EXAM: PORTABLE CHEST 1 VIEW COMPARISON:  01/19/2019 FINDINGS: Patchy bilateral airspace lung opacities are noted in the mid and lower lungs. No evidence of pulmonary edema. No pleural effusion or pneumothorax. Cardiac silhouette is normal in size. No mediastinal or hilar masses. Skeletal structures are grossly intact. IMPRESSION: Patchy bilateral mid and lower lung zone airspace opacities consistent with multifocal COVID-19 pneumonia. Electronically Signed   By: Lajean Manes M.D.   On: 06/25/2019 14:49    Time Spent in minutes  25   Kemaya Dorner M.D on 07/04/2019 at 11:27 AM  Between 7am to 7pm - Pager - [phone]  After 7pm go to [email or web address] - password [facility]  [facility] -  Office  [phone]

## 2019-07-04 NOTE — Progress Notes (Signed)
Updated daughter [name]) on patient's status.

## 2019-07-04 NOTE — Progress Notes (Signed)
Pts contact [name] updated. No further questions at this time.

## 2019-07-04 NOTE — Progress Notes (Signed)
Sacral foam dressing changed. Moisture barrier cream, nonadherent gauze, and sacral foam applied. Patient tolerated well.

## 2019-07-05 DIAGNOSIS — E11649 Type 2 diabetes mellitus with hypoglycemia without coma: Secondary | ICD-10-CM

## 2019-07-05 DIAGNOSIS — K5900 Constipation, unspecified: Secondary | ICD-10-CM

## 2019-07-05 LAB — GLUCOSE, CAPILLARY
Glucose-Capillary: 50 mg/dL — ABNORMAL LOW (ref 70–99)
Glucose-Capillary: 122 mg/dL — ABNORMAL HIGH (ref 70–99)
Glucose-Capillary: 123 mg/dL — ABNORMAL HIGH (ref 70–99)
Glucose-Capillary: 215 mg/dL — ABNORMAL HIGH (ref 70–99)

## 2019-07-05 MED ORDER — SENNA 8.6 MG PO TABS: 1.0000 | ORAL_TABLET | Freq: Every day | ORAL | Status: DC

## 2019-07-05 MED ORDER — BISACODYL 10 MG RE SUPP
10.0000 mg | Freq: Every day | RECTAL | Status: DC
  Administered 2019-07-05: 10:00:00 10 mg via RECTAL
  Filled 2019-07-05: qty 1

## 2019-07-05 MED ORDER — FLEET ENEMA 7-19 GM/118ML RE ENEM
1.0000 | ENEMA | Freq: Once | RECTAL | Status: AC
  Administered 2019-07-05: 12:00:00 1 via RECTAL

## 2019-07-05 MED ORDER — INSULIN ASPART 100 UNIT/ML ~~LOC~~ SOLN: 0.0000 [IU] | Freq: Three times a day (TID) | SUBCUTANEOUS | Status: DC

## 2019-07-05 MED ORDER — POLYETHYLENE GLYCOL 3350 17 G PO PACK
17.0000 g | PACK | Freq: Every day | ORAL | Status: DC
  Administered 2019-07-05: 10:00:00 17 g via ORAL
  Filled 2019-07-05: qty 1

## 2019-07-05 MED ORDER — SACCHAROMYCES BOULARDII 250 MG PO CAPS
250.0000 mg | ORAL_CAPSULE | Freq: Two times a day (BID) | ORAL | Status: DC
  Administered 2019-07-05: 250 mg via ORAL
  Filled 2019-07-05 (×2): qty 1

## 2019-07-05 MED ORDER — POLYETHYLENE GLYCOL 3350 17 G PO PACK: 17.0000 g | PACK | Freq: Every day | ORAL | 0 refills | Status: AC

## 2019-07-05 MED ORDER — DEXTROSE 50 % IV SOLN
25.0000 mL | Freq: Once | INTRAVENOUS | Status: AC
  Administered 2019-07-05: 04:00:00 25 mL via INTRAVENOUS
  Filled 2019-07-05: qty 50

## 2019-07-05 NOTE — Progress Notes (Signed)
Patient has COVID 19 which requires head of bed to be positioned in ways not feasible with a normal bed. Head must be elevated at least 30 degrees or patient will become short of breath and oxygen saturation decreases.  COVID 19 diagnosis requires frequent and immediate changes in body position which cannot be achieved with a normal bed.

## 2019-07-05 NOTE — Evaluation (Signed)
Occupational Therapy Evaluation Patient Details Name: [name] MRN: [id number] DOB: 10-Apr-1932 Today's Date: 07/05/2019    History of Present Illness [name] is an 54YOM, lives in ALF at [place], [place]: dementia, DM, HTN, PVD and CKD IV who presented with SOB earlier this month. Patient had tested positive for COVID 1/4 (12 days PTA) but doing well.  Pt recently DC but came back with uncontrolled hyperglycemia, in 700s 1DA better today.   Clinical Impression   Pt was seen for OT evaluation this date. Pt's PLOF just prior to this admission is unclear, but pt was ambulatory without AD prior to last admission 01/2019. Pt lives in [place] and family provides assist for groceries and med mgt. Currently pt demonstrates impairments as described below (See OT problem list) which functionally limit him ability to perform ADL/self-care tasks. Pt currently requires MOD A for sit to stand from elevated EOB, demos Poor static standing balance, and requires MAX A for LB ADLs.  Pt would benefit from skilled OT to address noted impairments and functional limitations (see below for any additional details) in order to maximize safety and independence while minimizing falls risk and caregiver burden. Upon hospital discharge, recommend STR at SNF to improve pt's fxl activity tolerance and strength as it pertains to safe performance of ADLs.    Follow Up Recommendations  SNF    Equipment Recommendations  3 in 1 bedside commode;Hospital bed    Recommendations for Other Services       Precautions / Restrictions Precautions Precautions: Fall Restrictions Weight Bearing Restrictions: No      Mobility Bed Mobility Overal bed mobility: Needs Assistance Bed Mobility: Supine to Sit;Sit to Supine     Supine to sit: Min assist;HOB elevated Sit to supine: Mod assist      Transfers Overall transfer level: Needs assistance Equipment used: Rolling walker (2 wheeled) Transfers: Sit to/from  Stand Sit to Stand: Mod assist;From elevated surface              Balance Overall balance assessment: Needs assistance;History of Falls Sitting-balance support: Feet supported;Single extremity supported Sitting balance-Leahy Scale: Good Sitting balance - Comments: fair initially, good once OT assists with scooting to plant both feet flat on floor for improved base of support   Standing balance support: Bilateral upper extremity supported Standing balance-Leahy Scale: Poor Standing balance comment: MIN A and heavy reliance on UE support through RW for static standing balance, anticipate increased assist would be needed to alternate hands/any more dynamic standing/fxl task                           ADL either performed or assessed with clinical judgement   ADL                                         General ADL Comments: MIN A to setup for seated UB ADLs, MOD/MAX A for LB ADLs sit/lateral lean, MOD A with sit to stand ADL t/f from elevated surface     Vision Baseline Vision/History: Wears glasses Patient Visual Report: No change from baseline       Perception     Praxis      Pertinent Vitals/Pain Pain Assessment: No/denies pain     Hand Dominance Right   Extremity/Trunk Assessment Upper Extremity Assessment Upper Extremity Assessment: Generalized weakness(shld 4-/5, elbow 4/5, grip  3+/5 B'ly)   Lower Extremity Assessment Lower Extremity Assessment: Defer to PT evaluation;Generalized weakness       Communication Communication Communication: No difficulties   Cognition Arousal/Alertness: Awake/alert Behavior During Therapy: WFL for tasks assessed/performed Overall Cognitive Status: History of cognitive impairments - at baseline                                 General Comments: unclear what pt's baseline is, but does have hx dementia. Pt ia appropriate in conversation and with commands, demos some confusion/increased time  for processing. Oriented to person and place (hospital, but can't remember which one), but not time (says Feb 2020) or situation.   General Comments       Exercises     Shoulder Instructions      Home Living Family/patient expects to be discharged to:: Assisted living                             Home Equipment: Hand held shower head;Walker - 2 wheels          Prior Functioning/Environment Level of Independence: Needs assistance  Gait / Transfers Assistance Needed: Pt is poor historian with some confusion, but states he has 2WW, hand held shower head is gleaned from eval in August 2020 ADL's / Homemaking Assistance Needed: Up until 58MO, Pt was Independent with ADLs, light household IADLs. Pt. received meals on wheels, son assists with medication management using a pillbox. Pt.drove minimally. Familiy assists with groceries.   Comments: Pt apparently did not use AD for fxl mobility prior to August 2020.        OT Problem List: Decreased strength;Decreased activity tolerance;Impaired balance (sitting and/or standing);Decreased cognition;Decreased safety awareness;Decreased knowledge of use of DME or AE      OT Treatment/Interventions:      OT Goals(Current goals can be found in the care plan section) Acute Rehab OT Goals Patient Stated Goal: regain strength OT Goal Formulation: With patient Time For Goal Achievement: 07/19/19 Potential to Achieve Goals: Good  OT Frequency:     Barriers to D/C:            Co-evaluation              AM-PAC OT "6 Clicks" Daily Activity     Outcome Measure Help from another person eating meals?: None Help from another person taking care of personal grooming?: A Little Help from another person toileting, which includes using toliet, bedpan, or urinal?: A Lot Help from another person bathing (including washing, rinsing, drying)?: A Lot Help from another person to put on and taking off regular upper body clothing?: A  Little Help from another person to put on and taking off regular lower body clothing?: A Lot 6 Click Score: 16   End of Session Equipment Utilized During Treatment: Gait belt;Rolling walker Nurse Communication: Mobility status;Other (comment)(communicated to RN that pt with c/o constipation, RN already aware and presenting with medication for constipation at this time)  Activity Tolerance: Patient tolerated treatment well Patient left: in bed;with call bell/phone within reach;with bed alarm set  OT Visit Diagnosis: Unsteadiness on feet (R26.81);Muscle weakness (generalized) (M62.81)                Time: 8657-8469 OT Time Calculation (min): 34 min Charges:  OT General Charges $OT Visit: 1 Visit OT Evaluation $OT Eval Moderate Complexity: 1 Mod OT Treatments $  Therapeutic Activity: 8-22 mins  Gerrianne Scale, [place], OTR/L ascom [phone] 07/05/19, 10:03 AM

## 2019-07-05 NOTE — NC FL2 (Addendum)
[place] LEVEL OF CARE SCREENING TOOL     IDENTIFICATION  Patient Name: [name] Birthdate: December 09, 1931 Sex: male Admission Date (Current Location): 07/01/2019  [place] and [place] Number:  Engineering geologist and Address:  [facility], [address] [place], [place] [address]      Provider Number: [id number]  Attending Physician Name and Address:  Louellen Molder, MD  Relative Name and Phone Number:       Current Level of Care: Hospital Recommended Level of Care: [place] Prior Approval Number:    Date Approved/Denied:   PASRR Number: [id number]  Discharge Plan: Other (Comment)(ALF)    Current Diagnoses: Patient Active Problem List   Diagnosis Date Noted  . Acute respiratory failure with hypoxia ([place]) 07/03/2019  . Hyperosmolar non-ketotic state in patient with type 2 diabetes mellitus ([place]) 07/02/2019  . Hyperglycemia due to type 2 diabetes mellitus ([place]) 07/01/2019  . History of COVID-19 07/01/2019  . Pressure injury of skin 06/27/2019  . Acute hypoxemic respiratory failure due to COVID-19 ([place]) 06/25/2019  . DKA (diabetic ketoacidoses) ([place]) 06/25/2019  . Dementia without behavioral disturbance ([place]) 06/25/2019  . CKD (chronic kidney disease), stage IV ([place]) 06/25/2019  . Iron deficiency anemia 06/25/2019  . Hyponatremia 06/25/2019  . High anion gap metabolic acidosis 12/87/8676  . AKI (acute kidney injury) ([place]) 06/25/2019  . Hypertensive urgency 06/25/2019  . Severe sepsis ([place]) 06/25/2019  . Closed right hip fracture ([place]) 01/19/2019  . Hip fracture ([place]) 01/19/2019  . Left arm cellulitis 09/05/2018  . Lymphedema 07/27/2018  . Chronic venous insufficiency 07/27/2018  . Ulcers of both lower legs, limited to breakdown of skin ([place]) 06/29/2018  . Peripheral arterial disease ([place]) 05/29/2018  . DM2 (diabetes mellitus, type 2) ([place]) 04/04/2018  . HTN (hypertension) 04/04/2018  . History of colonic  polyps 07/14/2015    Orientation RESPIRATION BLADDER Height & Weight     Self  Normal Incontinent Weight: 74 kg Height:  5\' 8"  (172.7 cm)  BEHAVIORAL SYMPTOMS/MOOD NEUROLOGICAL BOWEL NUTRITION STATUS      Incontinent Diet(Heart Healthy/ Carb modified)  AMBULATORY STATUS COMMUNICATION OF NEEDS Skin   Extensive Assist Verbally PU Stage and Appropriate Care(Foam dressing change PRN)                       Personal Care Assistance Level of Assistance  Bathing, Feeding, Dressing Bathing Assistance: Maximum assistance Feeding assistance: Limited assistance Dressing Assistance: Maximum assistance     Functional Limitations Info             SPECIAL CARE FACTORS FREQUENCY  PT (By licensed PT), OT (By licensed OT)     PT Frequency: home health 2-3 times per week OT Frequency: home health 2 times per week            Contractures Contractures Info: Not present    Additional Factors Info  Code Status, Allergies, Isolation Precautions Code Status Info: DNR Allergies Info: Meperidine, PCN     Isolation Precautions Info: COVID +     Current Medications (07/05/2019):  This is the current hospital active medication list Current Facility-Administered Medications  Medication Dose Route Frequency Provider Last Rate Last Admin  . 0.9 %  sodium chloride infusion   Intravenous Continuous Athena Masse, MD   Stopped at 07/05/19 1000  . acetaminophen (TYLENOL) tablet 650 mg  650 mg Oral Q6H PRN Athena Masse, MD       Or  .  acetaminophen (TYLENOL) suppository 650 mg  650 mg Rectal Q6H PRN Athena Masse, MD      . bisacodyl (DULCOLAX) suppository 10 mg  10 mg Rectal Daily Dhungel, Nishant, MD   10 mg at 07/05/19 1000  . enoxaparin (LOVENOX) injection 30 mg  30 mg Subcutaneous Q24H Hart Robinsons A, RPH   30 mg at 07/04/19 2029  . feeding supplement (GLUCERNA SHAKE) (GLUCERNA SHAKE) liquid 237 mL  237 mL Oral BID BM Dhungel, Nishant, MD   237 mL at 07/05/19 1054  . insulin aspart  (novoLOG) injection 0-9 Units  0-9 Units Subcutaneous TID WC Dhungel, Nishant, MD      . labetalol (NORMODYNE) injection 10 mg  10 mg Intravenous Q6H PRN Lang Snow, NP   10 mg at 07/04/19 0449  . multivitamin with minerals tablet 1 tablet  1 tablet Oral Daily Dhungel, Nishant, MD   1 tablet at 07/05/19 1035  . nutrition supplement (JUVEN) (JUVEN) powder packet 1 packet  1 packet Oral BID BM Dhungel, Nishant, MD   1 packet at 07/03/19 1647  . ondansetron (ZOFRAN) tablet 4 mg  4 mg Oral Q6H PRN Athena Masse, MD       Or  . ondansetron [facility]) injection 4 mg  4 mg Intravenous Q6H PRN Athena Masse, MD      . polyethylene glycol (MIRALAX / GLYCOLAX) packet 17 g  17 g Oral Daily Dhungel, Nishant, MD   17 g at 07/05/19 1000  . saccharomyces boulardii (FLORASTOR) capsule 250 mg  250 mg Oral BID Dhungel, Nishant, MD   250 mg at 07/05/19 1000  . senna (SENOKOT) tablet 8.6 mg  1 tablet Oral QHS Dhungel, Nishant, MD         Discharge Medications: Medication List        STOP taking these medications       predniSONE 10 MG (21) Tbpk tablet Commonly known as: STERAPRED UNI-PAK 21 TAB   predniSONE 10 MG tablet Commonly known as: DELTASONE             TAKE these medications       acetaminophen 500 MG tablet Commonly known as: TYLENOL Take 500 mg by mouth every 12 (twelve) hours as needed for mild pain.   amLODipine 10 MG tablet Commonly known as: NORVASC Take 10 mg by mouth daily.   ascorbic acid 500 MG tablet Commonly known as: VITAMIN C Take 500 mg by mouth 2 (two) times daily.   aspirin 81 MG EC tablet Take 1 tablet (81 mg total) by mouth daily.   B-Complex Tabs Take 1 tablet by mouth daily.   calcitRIOL 0.25 MCG capsule Commonly known as: ROCALTROL Take 0.25 mcg by mouth daily.   calcium carbonate 1500 (600 Ca) MG Tabs tablet Commonly known as: OSCAL Take 600 mg of elemental calcium by mouth 2 (two) times daily.   denosumab 60 MG/ML Sosy  injection Commonly known as: PROLIA Inject 60 mg into the skin every 6 (six) months.   feeding supplement (NEPRO CARB STEADY) Liqd Take 237 mLs by mouth 3 (three) times daily with meals.   ferrous sulfate 325 (65 FE) MG tablet Take 325 mg by mouth daily with breakfast.   furosemide 20 MG tablet Commonly known as: LASIX Take 10-40 mg by mouth 2 (two) times daily. Take 2 tablets (40mg ) by mouth every morning and take  tablet (10mg ) by mouth at lunchtime       Jalyn 0.5-0.4 MG Caps Generic drug:  Dutasteride-Tamsulosin HCl Take 1 capsule by mouth daily.   losartan 25 MG tablet Commonly known as: COZAAR Take 0.5 tablets (12.5 mg total) by mouth 2 (two) times daily.   multivitamin capsule Take 1 capsule by mouth daily.   potassium chloride 10 MEQ tablet Commonly known as: KLOR-CON Take 10 mEq by mouth daily.   sodium bicarbonate 650 MG tablet Take 650 mg by mouth 2 (two) times daily.   Tradjenta 5 MG Tabs tablet Generic drug: linagliptin Take 5 mg by mouth daily.   triamcinolone ointment 0.1 % Commonly known as: KENALOG Apply 1 application topically 2 (two) times daily. (apply to arms and legs)   Vitamin D 50 MCG (2000 UT) tablet Take 2,000 Units by mouth daily.   zinc gluconate 50 MG tablet Take 100 mg by mouth daily.                    Relevant Imaging Results:  Relevant Lab Results:   Additional Information SSN [id number]  Shelbie Hutching, RN

## 2019-07-05 NOTE — Progress Notes (Signed)
Inpatient Diabetes Program Recommendations  AACE/ADA: New Consensus Statement on Inpatient Glycemic Control (2015)  Target Ranges:  Prepandial:   less than 140 mg/dL      Peak postprandial:   less than 180 mg/dL (1-2 hours)      Critically ill patients:  140 - 180 mg/dL   Lab Results  Component Value Date   GLUCAP 123 (H) 07/05/2019   HGBA1C 9.1 (H) 06/26/2019    Review of Glycemic Control Results for SUSAN, ARANA (MRN [id number]) as of 07/05/2019 09:34  Ref. Range 07/04/2019 19:41 07/04/2019 23:21 07/05/2019 04:18 07/05/2019 04:43 07/05/2019 07:34  Glucose-Capillary Latest Ref Range: 70 - 99 mg/dL 233 (H) 102 (H) 50 (L) 122 (H) 123 (H)    Inpatient Diabetes Program Recommendations:   -Decrease Novolog correction to sensitive tid + hs 0-5 units -Add Levemir 10 units qd  Thank you, Nani Gasser. Kinleigh Nault, RN, MSN, CDE  Diabetes Coordinator Inpatient Glycemic Control Team Team Pager [phone] (8am-5pm) 07/05/2019 9:45 AM

## 2019-07-05 NOTE — Progress Notes (Signed)
PROGRESS NOTE                                                                                                                                                                                                             Patient Demographics:    [name], is a 86 y.o. male, DOB - [birth year], NTI:[id number]  Admit date - 07/01/2019   Admitting Physician Louellen Molder, MD  Outpatient Primary MD for the patient is [name], MD  LOS - 3    Chief Complaint  Patient presents with  . Pneumonia  . Hyperglycemia       Brief Narrative   86 year old male with history of dementia, uncontrolled diabetes mellitus type 2, CKD stage IV who was discharged on 1/5:20 days hospitalization for COVID-19 pneumonia and respiratory failure was sent from the assisted living for hyperglycemia. His blood glucose was 531. Patient had completed 5-day course of remdesivir and received dexamethasone and discharged on prednisone taper. EMS found his blood glucose to be 531 and oxygen saturation of 91% on room air, placed on 2 L via nasal cannula. In the ED he was maintaining sats in the mid 90s on 2 L, afebrile. Blood glucose of 732 and creatinine of 3.67 (at baseline). He was given 15 units IV insulin along with IV fluids. Admitted for further management.   Subjective:   Noted for low CBG overnight.  Received IV dextrose and stable.  Has not had bowel movement for almost a week.  Is more interactive today.  Denies any symptoms.   Assessment  & Plan :   Hyperosmolar nonketotic state in type 2 diabetes mellitus Secondary to steroid use and recent COVID-19 infection.  Off steroid.  Noted for low blood glucose overnight.  Switch to sensitive sliding scale coverage.  Continue Tradjenta upon discharge.  Recommend CBG monitoring 4 times a day and encourage adequate food intake..  (Assisted living unable to do sliding scale coverage).  Does not need  scheduled insulin.  Acute respiratory failure with hypoxia Secondary to COVID-19 infection.  Now stable on room air and ambulation.    Chronic kidney disease stage IV Stable at baseline.  Constipation Appears impacted.  Ordered suppository and Fleet enema.     Code Status :DNR  Family Communication  : Daughter-in-law updated  Disposition Plan  : Return to assisted living once  equipment is delivered (wheelchair, hospital bed)  Lab Results  Component Value Date   PLT 256 07/01/2019    Antibiotics  :   Anti-infectives (From admission, onward)   None        Objective:   Vitals:   07/05/19 0000 07/05/19 0400 07/05/19 0719 07/05/19 0800  BP:  (!) 164/74 (!) 165/77 (!) 149/77  Pulse:  86 94 82  Resp: (!) 21 19 20 19   Temp:  98.9 F (37.2 C) 98.6 F (37 C)   TempSrc:  Axillary Oral   SpO2:  96% 97% 97%  Weight:      Height:        Wt Readings from Last 3 Encounters:  07/01/19 74 kg  06/26/19 74.2 kg  01/19/19 64.4 kg     Intake/Output Summary (Last 24 hours) at 07/05/2019 1240 Last data filed at 07/05/2019 0600 Gross per 24 hour  Intake 1050 ml  Output 1550 ml  Net -500 ml   Physical exam Elderly male not in distress, fatigued but more interactive today HEENT: Moist mucosa, supple neck Chest: Clear CVs: Normal S1-S2 GI: Soft, nondistended, nontender Musculoskeletal: Warm, no edema     Data Review:    CBC Recent Labs  Lab 06/29/19 0527 06/30/19 0410 07/01/19 1639  WBC 14.2* 14.4* 16.9*  HGB 9.8* 9.5* 9.9*  HCT 28.7* 28.2* 30.4*  PLT 251 245 256  MCV 88.6 89.5 91.6  MCH 30.2 30.2 29.8  MCHC 34.1 33.7 32.6  RDW 12.3 12.6 12.9  LYMPHSABS 0.4* 0.5* 0.5*  MONOABS 0.2 0.6 0.6  EOSABS 0.0 0.0 0.0  BASOSABS 0.0 0.1 0.1    Chemistries  Recent Labs  Lab 06/29/19 0527 06/30/19 0410 07/01/19 1639 07/02/19 1517 07/03/19 0645  NA 142 140 131* 143  --   K 4.0 4.2 5.1 4.7  --   CL 111 112* 102 115*  --   CO2 16* 17* 16* 16*  --   GLUCOSE  65* 76 732* 105*  --   BUN 100* 98* 94* 83*  --   CREATININE 3.87* 3.49* 3.62* 3.32* 2.93*  CALCIUM 7.4* 7.5* 8.0* 7.9*  --   MG 2.5* 2.5*  --  2.4  --   AST 55* 47*  --   --   --   ALT 45* 44  --   --   --   ALKPHOS 53 52  --   --   --   BILITOT 0.8 0.5  --   --   --    ------------------------------------------------------------------------------------------------------------------ No results for input(s): CHOL, HDL, LDLCALC, TRIG, CHOLHDL, LDLDIRECT in the last 72 hours.  Lab Results  Component Value Date   HGBA1C 9.1 (H) 06/26/2019   ------------------------------------------------------------------------------------------------------------------ No results for input(s): TSH, T4TOTAL, T3FREE, THYROIDAB in the last 72 hours.  Invalid input(s): FREET3 ------------------------------------------------------------------------------------------------------------------ No results for input(s): VITAMINB12, FOLATE, FERRITIN, TIBC, IRON, RETICCTPCT in the last 72 hours.  Coagulation profile No results for input(s): INR, PROTIME in the last 168 hours.  No results for input(s): DDIMER in the last 72 hours.  Cardiac Enzymes No results for input(s): CKMB, TROPONINI, MYOGLOBIN in the last 168 hours.  Invalid input(s): CK ------------------------------------------------------------------------------------------------------------------    Component Value Date/Time   BNP 203.0 (H) 06/25/2019 1530    Inpatient Medications  Scheduled Meds: . bisacodyl  10 mg Rectal Daily  . enoxaparin (LOVENOX) injection  30 mg Subcutaneous Q24H  . feeding supplement (GLUCERNA SHAKE)  237 mL Oral BID BM  . insulin aspart  0-20  Units Subcutaneous Q4H  . multivitamin with minerals  1 tablet Oral Daily  . nutrition supplement (JUVEN)  1 packet Oral BID BM  . polyethylene glycol  17 g Oral Daily  . saccharomyces boulardii  250 mg Oral BID  . senna  1 tablet Oral QHS   Continuous Infusions: . sodium  chloride Stopped (07/05/19 1000)   PRN Meds:.acetaminophen **OR** acetaminophen, labetalol, ondansetron **OR** ondansetron (ZOFRAN) IV  Micro Results Recent Results (from the past 240 hour(s))  Blood Culture (routine x 2)     Status: Abnormal   Collection Time: 06/25/19  1:54 PM   Specimen: BLOOD  Result Value Ref Range Status   Specimen Description   Final    BLOOD LEFT ANTECUBITAL Performed at [facility], [address] [place], [place] [address]    Special Requests   Final    BOTTLES DRAWN AEROBIC AND ANAEROBIC Blood Culture results may not be optimal due to an inadequate volume of blood received in culture bottles Performed at [facility], [address] [place], [place] [address]    Culture  Setup Time   Final    GRAM POSITIVE COCCI AEROBIC BOTTLE ONLY CRITICAL RESULT CALLED TO, READ BACK BY AND VERIFIED WITH: DAVID BESANTI AT 0947 06/26/19.PMF Performed at [facility], [address] [place], [place] [address]    Culture (A)  Final    STAPHYLOCOCCUS SPECIES (COAGULASE NEGATIVE) THE SIGNIFICANCE OF ISOLATING THIS ORGANISM FROM A SINGLE SET OF BLOOD CULTURES WHEN MULTIPLE SETS ARE DRAWN IS UNCERTAIN. PLEASE NOTIFY THE MICROBIOLOGY DEPARTMENT WITHIN ONE WEEK IF SPECIATION AND SENSITIVITIES ARE REQUIRED. Performed at [facility], [place] [address], [place], [place] [address]    Report Status 06/27/2019 FINAL  Final  Blood Culture (routine x 2)     Status: None   Collection Time: 06/25/19  1:54 PM   Specimen: BLOOD  Result Value Ref Range Status   Specimen Description BLOOD BLOOD RIGHT FOREARM  Final   Special Requests   Final    BOTTLES DRAWN AEROBIC AND ANAEROBIC Blood Culture adequate volume   Culture   Final    NO GROWTH 5 DAYS Performed at [facility], [address] [place], [place] [address]    Report Status 06/30/2019 FINAL  Final  Urine culture     Status: Abnormal   Collection Time: 06/25/19  7:07 PM    Specimen: Urine, Random  Result Value Ref Range Status   Specimen Description   Final    URINE, RANDOM Performed at [facility], [address] [place], [place] [address]    Special Requests   Final    NONE Performed at [facility], [address], [place], [place] [address]    Culture (A)  Final    <10,000 COLONIES/mL INSIGNIFICANT GROWTH Performed at [facility], [place] [address], [place], [place] [address]    Report Status 06/27/2019 FINAL  Final  MRSA PCR Screening     Status: None   Collection Time: 07/03/19  6:55 AM   Specimen: Nasal Mucosa; Nasopharyngeal  Result Value Ref Range Status   MRSA by PCR NEGATIVE NEGATIVE Final    Comment:        The GeneXpert MRSA Assay (FDA approved for NASAL specimens only), is one component of a comprehensive MRSA colonization surveillance program. It is not intended to diagnose MRSA infection nor to guide or monitor treatment for MRSA infections. Performed at [facility], [address] [place],  [address]     Radiology Reports NM Pulmonary Perfusion  Result Date: 06/27/2019 CLINICAL DATA:  Respiratory failure.  COVID positive. EXAM: NUCLEAR MEDICINE PERFUSION LUNG SCAN TECHNIQUE: Perfusion images were obtained in multiple projections after intravenous injection of radiopharmaceutical. Ventilation scans intentionally deferred if perfusion scan and chest x-ray adequate for interpretation during COVID 19 epidemic. RADIOPHARMACEUTICALS:  3.2 mCi Tc-51m MAA IV COMPARISON:  Chest x-ray FINDINGS: No segmental or subsegmental perfusion defects to suggest pulmonary embolism. IMPRESSION: Negative pulmonary perfusion study for pulmonary embolism. Electronically Signed   By: Marijo Sanes M.D.   On: 06/27/2019 15:20   DG Chest Port 1 View  Result Date: 06/27/2019 CLINICAL DATA:  Respiratory failure. COVID-19 pneumonia. EXAM: PORTABLE CHEST 1 VIEW COMPARISON:  Chest x-ray 06/25/2019 FINDINGS: Heart  size is normal. Patchy airspace opacities are seen bilaterally. Aeration is improving. Lung volumes have slightly improved. IMPRESSION: Improving aeration with persistent patchy bilateral airspace disease. Electronically Signed   By: [place] Morelle M.D.   On: 06/27/2019 10:07   DG Chest Port 1 View  Result Date: 06/25/2019 CLINICAL DATA:  Pt arrived via [place] from [facility] via [facility] w/ SOB EXAM: PORTABLE CHEST 1 VIEW COMPARISON:  01/19/2019 FINDINGS: Patchy bilateral airspace lung opacities are noted in the mid and lower lungs. No evidence of pulmonary edema. No pleural effusion or pneumothorax. Cardiac silhouette is normal in size. No mediastinal or hilar masses. Skeletal structures are grossly intact. IMPRESSION: Patchy bilateral mid and lower lung zone airspace opacities consistent with multifocal COVID-19 pneumonia. Electronically Signed   By: Lajean Manes M.D.   On: 06/25/2019 14:49    Time Spent in minutes  25   Johnathin Vanderschaaf M.D on 07/05/2019 at 12:40 PM  Between 7am to 7pm - Pager - [phone]  After 7pm go to [email or web address] - password [facility]  [facility] -  Office  [phone]

## 2019-07-05 NOTE — TOC Transition Note (Signed)
Transition of Care [facility]) - CM/SW Discharge Note   Patient Details  Name: [name] MRN: [id number] Date of Birth: August 08, 1931  Transition of [facility] CM/SW Contact:  Shelbie Hutching, RN Phone Number: 07/05/2019, 12:56 PM   Clinical Narrative:    Patient will discharge home this afternoon, daughter [name] is coming to pick patient up and transport him back to [facility].  Hospital bed and wheelchair have been ordered and will be delivered tonight by [facility] to [facility].  Patient's daughter [name] reports that the patient has a nice transport chair that will be okay until the bed arrives.     Final next level of care: Assisted Living Barriers to Discharge: Barriers Resolved   Patient Goals and CMS Choice        Discharge Placement              Patient chooses bed at: [facility]) Patient to be transferred to facility by: Daughter will transport Name of family member notified: [name] Patient and family notified of of transfer: 07/05/19  Discharge Plan and Services   Discharge Planning Services: CM Consult            DME Arranged: Hospital bed, Lightweight manual wheelchair with seat cushion DME Agency: ([place]) Date DME Agency Contacted: 07/05/19 Time DME Agency Contacted: 0930 Representative spoke with at DME Agency: Intake call center number [phone] HH Arranged: RN, PT, OT HH Agency: [facility] Date [place]: 07/05/19 Time [place]: 0930 Representative spoke with at [place] ([place]) Interventions     Readmission Risk Interventions Readmission Risk Prevention Plan 06/30/2019 01/21/2019  Transportation Screening Complete Complete  PCP or Specialist Appt within 3-5 Days Complete Complete  HRI or Center Complete Complete  Social Work Consult for [place] Planning/Counseling Complete -  Palliative Care Screening Not Applicable Not Applicable   Medication Review Press photographer) Complete Complete  Some recent data might be hidden

## 2019-07-05 NOTE — Consult Note (Signed)
WOC Nurse Consult Note: Reason for Consult:Deep tissue injury to bilateral buttocks with sloughing epithelium Wound type:pressure injury, full thickness Pressure Injury POA: Yes Measurement: 4 cm x 2 cm x 0.1 cm to bilateral gluteal folds with peeling epithelium Wound DQQ:IWLNLG discoloration with peeling epithelium Drainage (amount, consistency, odor) minimal serosanguinous  No odor.  Periwound:intact Dressing procedure/placement/frequency: Cleanse buttocks with soap and water and pat dry.  Apply silicone foam dressing   change every three days.  Will not follow at this time.  Please re-consult if needed.  Domenic Moras MSN, RN, FNP-BC CWON Wound, Ostomy, Continence Nurse Pager [phone]

## 2019-07-05 NOTE — Progress Notes (Cosign Needed)
Patient suffers from [place] 19, and dementia which impairs their ability to perform daily activities like bathing, dressing, walking in the home.  A walker will not resolve  issue with performing activities of daily living. A wheelchair will allow patient to safely perform daily activities. Patient is not able to propel themselves in the home using a standard weight wheelchair due to weakness and shortness of breath. Patient can self propel in the lightweight wheelchair. Length of need lifetime. Accessories: elevating leg rests (ELRs), wheel locks, extensions and anti-tippers, seat cushion and back cushion.

## 2019-07-05 NOTE — Progress Notes (Signed)
Nurse tech notified this writer that pts CBG at 0418 was 79. On assessment pt was resting comfortably in bed, easy to arouse with no complaints. Order for 25G of dextrose placed per protocol and administered. VSS. Notifying on call NP to make aware. Will cont to monitor.

## 2019-07-05 NOTE — TOC Progression Note (Signed)
Transition of Care [facility]) - Progression Note    Patient Details  Name: HONEST VANLEER MRN: [id number] Date of Birth: 03-25-1932  Transition of Care [facility]) CM/SW Contact  Shelbie Hutching, RN Phone Number: 07/05/2019, 9:56 AM  Clinical Narrative:    Plan for patient to discharge today back to [facility].  Patient's daughter in law [name] will pick patient up and transport back to [facility] [employer] [facility].  [name] reports that 24/7 care has been hired by [facility].  RNCM was able to order bed and wheelchair from [facility], orders faxed to [phone].  [facility] reports that bed and wheelchair can be delivered today.  Cassie with [facility] notified of discharge today.  Home Health orders in for RN, PT, and OT.         Expected Discharge Plan and Services     Discharge Planning Services: CM Consult     Expected Discharge Date: 07/04/19               DME Arranged: Hospital bed, Lightweight manual wheelchair with seat cushion DME Agency: Other - [place]) Date DME Agency Contacted: 07/05/19 Time DME Agency Contacted: 0930 Representative spoke with at DME Agency: Intake call center number [phone] HH Arranged: RN, PT, OT HH Agency: [facility] Date [place]: 07/05/19 Time [place]: 0930 Representative spoke with at [place]: Cassie   Social Determinants of Health ([place]) Interventions    Readmission Risk Interventions Readmission Risk Prevention Plan 06/30/2019 01/21/2019  Transportation Screening Complete Complete  PCP or Specialist Appt within 3-5 Days Complete Complete  HRI or [place] Complete Complete  Social Work Consult for [place] Planning/Counseling Complete -  Palliative Care Screening Not Applicable Not Applicable  Medication Review Press photographer) Complete Complete  Some recent data might be hidden

## 2019-07-12 ENCOUNTER — Other Ambulatory Visit: Payer: Self-pay | Admitting: Internal Medicine

## 2019-07-12 DIAGNOSIS — U071 COVID-19: Secondary | ICD-10-CM

## 2019-07-14 ENCOUNTER — Non-Acute Institutional Stay: Payer: Medicare Other | Admitting: Nurse Practitioner

## 2019-07-14 ENCOUNTER — Other Ambulatory Visit: Payer: Self-pay

## 2019-07-14 ENCOUNTER — Encounter: Payer: Self-pay | Admitting: Nurse Practitioner

## 2019-07-14 DIAGNOSIS — N184 Chronic kidney disease, stage 4 (severe): Secondary | ICD-10-CM

## 2019-07-14 DIAGNOSIS — F0391 Unspecified dementia with behavioral disturbance: Secondary | ICD-10-CM

## 2019-07-14 DIAGNOSIS — Z515 Encounter for palliative care: Secondary | ICD-10-CM

## 2019-07-14 NOTE — Progress Notes (Addendum)
  [facility]iative [facility] Consult Note Telephone: [phone]  Fax: [phone]  PATIENT NAME: [name] DOB: 04/15/1932 MRN: [id number]  PRIMARY CARE PROVIDER:   Hedrick, James, MD  REFERRING PROVIDER:  Hedrick, James, MD [address] [facility] [place],  [place] [address]  RESPONSIBLE PARTY:   Daughter in Law [name] Taaffe  I was asked by Dr Hedrick to see [name] for Palliative care consult for goals of care  This visit was done face to face in person   RECOMMENDATIONS and PLAN:  1. ACP: DNR, in Epic/Vynca; will continue PT for another week to see if any progress, if no progress will revisit hospice    2. Palliative care encounter; Palliative medicine team will continue to support patient, patient's family, and medical team. Visit consisted of counseling and education dealing with the complex and emotionally intense issues of symptom management and palliative care in the setting of serious and potentially life-threatening illness  I spent 75 minutes providing this consultation,  from 10:30am to 12:00pm. More than 50% of the time in this consultation was spent coordinating communication.   HISTORY OF PRESENT ILLNESS:  [name] is a 86 y.o. year old male with multiple medical problems including Dementia, Cancer, iron deficiency anemia , peripheral artery disease, chronic kidney disease, diabetes, heart murmur, hypertension, hyperlipidemia, retinal detachment, polypectomy, right intramedullary nail, hernia repair, eye surgery. Hospitalized 1 / 15 / 2021 to 1 / 20 / 2021 for systemic inflammatory response syndrome, acute hypoxic respiratory failure secondary to covid-19 positive bacterial pneumonia with acute metabolic encephalopathy secondary to dementia though Baseline upon discharge. Acute renal failure with chronic kidney disease stage 4 with Baseline creatinine 3.8 and 3.4 on day of discharge. Positive covid 1/4 / 2021. [name] did develop  a stage 2 pressure ulcer sacrum, moderate protein calorie malnutrition during hospitalization. [name] did receive remdesivir, dexamethasone with discharge on a prednisone taper. [name] returned to the hospital 1 / 21 / 2021 to 1 / 25 / 2021 for hyperosmolsr nonketotic state with type 2 diabetes. Elevated glucose was 531 what do to saturation 91 on room air. Blood sugar's improve with IV fluids, IV insulin and discontinuing prednisone. During hospitalization he did have a low blood sugar of 50. Acute respiratory failure with hypoxia secondary to recent covid-19 section. [name] is a do not resuscitate. [name] return to [facility]. Mr [name] does require assistance for transfers, mobility. [name] is able to stand and use his walker to take steps. [place] since he's returned to [facility]. [name] does require assistance with ADLs. [name] does continue to have dressing change this to a sacral wound. [name] does feed himself after tray setup with declined appetite. [name] has continued to be wheat, sleeping more with some periods of confusion her staff. Mr [name] does have caregivers that stay with him. Palliative care to see [name] today for goals of care. I visited and observe [name]. [name] appears weak, chronically ill, 02 dependent. Mr [name] did make eye contact with verbal cues. He did answer simple questions. He denied symptoms of shortness of breath. Mr [name] endorses that his backside does hurt where the wound is. We talked about Tylenol for pain. We talked about his appetite which has been poor. Mr [name] was cooperative with the assessment. [name] did fall asleep during palliative care visit. Emotional support provided. I met with [name]   daughter-in-law [name] Health Care power-of-attorney separately. [name] and I talked about the last time Mr [name] was independent at home caring for his wife with dementia. [name] cared for her for 5 years at home and she passed  about a year ago. [name] was walking, had a hip fracture and then fell which resulted in hospitalization several months ago. Also most recent hospitalizations due to covid-19 pneumonia, respiratory failure, chronic kidney disease stage 4. We talked about rehospitalization where Mr [name] blood sugar was extremely high secondary to prednisone and required a second hospitalization. We talked about chronic disease progression the setting of natural aging. We talked about returning to [facility] where he currently resides. We talked about his work with therapy that he is doing which is minimal. We talked about him being able to ambulate with the Walker though it does appear to exhaustion. We talked about symptoms, appetite and decrease oral intake. We talked about medical goals of care is he is a DNR and do not intubate. We talked about aggressive versus comfort care. We talked about rehab and what the expectations are. We talked about his weakness and fatigue. We talked about the shortness of breath that he does appear to experience with any type of exertion including eating. We talked about role of palliative care and plan of care. [name] endorses that she cared for her mother-in-law and her mother with the help of Hospice Services. [name] endorses though that there are some barriers with the idea of hospice with Mr Proby's daughter and son. We talked about what Hospice Services provide, philosophy of Hospice. We talked about the role of palliative care and plan of care. We talked about seeing how Mr [name] does over the next week with physical therapy and then readdress medical goals of care. [name] in agreement and appointment scheduled for one week or sooner said he declined. Appointment schedule. Therapeutic listening and emotional support provided. Contact information. Questions answered to satisfaction. I updated [name] plan of care at [facility]. Palliative Care was asked to help address goals of  care.   CODE STATUS: DNR  PPS: 40% HOSPICE ELIGIBILITY/DIAGNOSIS: TBD  PAST MEDICAL HISTORY:  Past Medical History:  Diagnosis Date  . Cancer (HCC)   . Chronic kidney disease   . Colon polyp   . Diabetes mellitus without complication (HCC)   . Heart murmur 2008  . Hyperlipidemia   . Hypertension 1980  . Retinal detachment     SOCIAL HX:  Social History   Tobacco Use  . Smoking status: Never Smoker  . Smokeless tobacco: Never Used  Substance Use Topics  . Alcohol use: No    ALLERGIES:  Allergies  Allergen Reactions  . Meperidine Other (See Comments)    Severe bradycardia Severe bradycardia   . Other     Darvocet-N Lowers heart rate  . Penicillins Rash     PERTINENT MEDICATIONS:  Outpatient Encounter Medications as of 07/14/2019  Medication Sig  . acetaminophen (TYLENOL) 500 MG tablet Take 500 mg by mouth every 12 (twelve) hours as needed for mild pain.  . amLODipine (NORVASC) 10 MG tablet Take 10 mg by mouth daily.   . aspirin EC 81 MG EC tablet Take 1 tablet (81 mg total) by mouth daily.  . B-Complex TABS Take 1 tablet by mouth daily.  . calcitRIOL (ROCALTROL) 0.25 MCG capsule Take 0.25 mcg by mouth daily.   . calcium carbonate (OSCAL) 1500 (600 Ca) MG TABS tablet Take 600 mg   of elemental calcium by mouth 2 (two) times daily.  . Cholecalciferol (VITAMIN D) 50 MCG (2000 UT) tablet Take 2,000 Units by mouth daily.  . denosumab (PROLIA) 60 MG/ML SOSY injection Inject 60 mg into the skin every 6 (six) months.  . ferrous sulfate 325 (65 FE) MG tablet Take 325 mg by mouth daily with breakfast.  . furosemide (LASIX) 20 MG tablet Take 10-40 mg by mouth 2 (two) times daily. Take 2 tablets (40mg) by mouth every morning and take  tablet (10mg) by mouth at lunchtime  . JALYN 0.5-0.4 MG CAPS Take 1 capsule by mouth daily.   . linagliptin (TRADJENTA) 5 MG TABS tablet Take 5 mg by mouth daily.  . losartan (COZAAR) 25 MG tablet Take 0.5 tablets (12.5 mg total) by mouth 2 (two)  times daily.  . Multiple Vitamin (MULTIVITAMIN) capsule Take 1 capsule by mouth daily.  . Nutritional Supplements (FEEDING SUPPLEMENT, NEPRO CARB STEADY,) LIQD Take 237 mLs by mouth 3 (three) times daily with meals.  . polyethylene glycol (MIRALAX / GLYCOLAX) 17 g packet Take 17 g by mouth daily.  . potassium chloride (KLOR-CON) 10 MEQ tablet Take 10 mEq by mouth daily.  . saccharomyces boulardii (FLORASTOR) 250 MG capsule Take 250 mg by mouth 2 (two) times daily.  . sodium bicarbonate 650 MG tablet Take 650 mg by mouth 2 (two) times daily.  . triamcinolone ointment (KENALOG) 0.1 % Apply 1 application topically 2 (two) times daily. (apply to arms and legs)   No facility-administered encounter medications on file as of 07/14/2019.    PHYSICAL EXAM:   General: Weak, frail appearing, thin, elderly male Cardiovascular: regular rate and rhythm Pulmonary: clear ant fields Abdomen: soft, nontender, + bowel sounds Extremities: no edema, no joint deformities Neurological: generalized weakness   Z , NP    

## 2019-07-26 ENCOUNTER — Encounter: Payer: Self-pay | Admitting: Nurse Practitioner

## 2019-07-26 ENCOUNTER — Non-Acute Institutional Stay: Payer: Medicare Other | Admitting: Nurse Practitioner

## 2019-07-26 ENCOUNTER — Other Ambulatory Visit: Payer: Self-pay | Admitting: Internal Medicine

## 2019-07-26 ENCOUNTER — Other Ambulatory Visit: Payer: Self-pay

## 2019-07-26 DIAGNOSIS — Z515 Encounter for palliative care: Secondary | ICD-10-CM

## 2019-07-26 DIAGNOSIS — F0391 Unspecified dementia with behavioral disturbance: Secondary | ICD-10-CM

## 2019-07-26 NOTE — Progress Notes (Signed)
[place] Consult Note Telephone: [phone]  Fax: [phone]  PATIENT NAME: [name] DOB: 1932/01/10 MRN: [id number]  PRIMARY CARE PROVIDER:   [name], MD  REFERRING PROVIDER:  [name], MD [address] [facility] [place],  [place] [address]  RESPONSIBLE PARTY:   Daughter in [place]  I was asked by Dr Kary Kos to see [name]. [name] for Palliative care consult for goals of care  This visit was done face to face in person  RECOMMENDATIONS and PLAN: 1.ACP: DNR, in Epic/Vynca; will continue PT for another week to see if any progress, if no progress will revisit hospice   2.Palliative care encounter; Palliative medicine team will continue to support patient, patient's family, and medical team. Visit consisted of counseling and education dealing with the complex and emotionally intense issues of symptom management and palliative care in the setting of serious and potentially life-threatening illness  I spent 65  minutes providing this consultation,  from 12:00pm  To 1:05pm. More than 50% of the time in this consultation was spent coordinating communication.   HISTORY OF PRESENT ILLNESS:  [name] is a 86 y.o. year old male with multiple medical problems including Dementia, Cancer, iron deficiency anemia , peripheral artery disease, chronic kidney disease, diabetes, heart murmur, hypertension, hyperlipidemia, retinal detachment, polypectomy, right intramedullary nail, hernia repair, eye surgery. Hospitalized 1 / 15 / 2021 to 1 / 20 / 2021 for systemic inflammatory response syndrome, acute hypoxic respiratory failure secondary to covid-19 positive bacterial pneumonia with acute metabolic encephalopathy secondary to dementia though Baseline upon discharge. Acute renal failure with chronic kidney disease stage 4 with Baseline creatinine 3.8 and 3.4 on day of discharge. Positive covid 1/4 / 2021. [name] Sibert did  develop a stage 2 pressure ulcer sacrum, moderate protein calorie malnutrition during hospitalization. [name]. Proch did receive remdesivir, dexamethasone with discharge on a prednisone taper. [name]. Safi returned to the hospital 1 / 21 / 2021 to 1 / 25 / 2021 for hyperosmolsr nonketotic state with type 2 diabetes.[name]. Gura continues to reside at [facility]. [name] Borowiak does require assistance for standing, taking steps, adl's and toileting. Physical Therapy continues to work with [name]. Gartrell. Physical therapist reports today that he walked 300 ft with his walker without oxygen and did well. [name]. [place] is feeding himself with appetite improving. Speech currently working with [name]. Saephan and share that he is not coughing with drinking today. Staff endorses [name]. Nelis does appear to be over all doing better. Area on [name]. Vassar buttocks that was causing him discomfort has been healing. A present [name]. Sakuma is sitting in his recliner. [name] Gathright denies any concerns or complaints. We talked about purpose of palliative care visit. We talked about how he was feeling today. We talked about symptoms of pain and shortness of breath what she denies. We talked about is improving appetite. We talked about the work he is doing with therapy. [name]. Leider talked about walking and praised him for his accomplishment. We talked about role of palliative care and plan of care. I met separately with Suanne Marker, daughter-in-law son and daughter. We talked about work [name]. Shatto has been doing with their B. We talked about realistic expectations. Suanne Marker endorses that [name] Biggins has been continuing to sleep a lot. We talked at length about the catastrophic events that he sustained including the loss of his wife a year ago, then a fractured hip about  6 months ago and then covid-19 infection with hospitalizations. We talked about his increase in strength and willingness to participate in therapy. We talked about [name]. Poss positive attitude and wanting to  walk as he expressed that multiple times to family and caregivers. We talked about medical goals of care with focus on Comfort. We talked about quality of life. We talked about suffering. We talked about aggressive interventions versus Comfort Care. Discuss that present time [name] Strahan wishes to continue to walk and therapy being a positive influence will continue for now until new baseline is established. Discussed Services of hospice. We talked about once therapy is completed or should he declined will revisit Hospice Services. We talked about role of palliative care and plan of care. Therapeutic listening and emotional support provided. Questions answered to satisfaction. Contact information. Discussed will follow up in two weeks if needed or sooner should he declined. Family in agreement. I updated staff Palliative Care was asked to help to continue to address goals of care.   CODE STATUS: DNR  PPS: 40% HOSPICE ELIGIBILITY/DIAGNOSIS: TBD  PAST MEDICAL HISTORY:  Past Medical History:  Diagnosis Date  . Cancer ([place])   . Chronic kidney disease   . Colon polyp   . Diabetes mellitus without complication ([place])   . Heart murmur 2008  . Hyperlipidemia   . Hypertension 1980  . Retinal detachment     SOCIAL HX:  Social History   Tobacco Use  . Smoking status: Never Smoker  . Smokeless tobacco: Never Used  Substance Use Topics  . Alcohol use: No    ALLERGIES:  Allergies  Allergen Reactions  . Meperidine Other (See Comments)    Severe bradycardia Severe bradycardia   . Other     Darvocet-N Lowers heart rate  . Penicillins Rash     PERTINENT MEDICATIONS:  Outpatient Encounter Medications as of 07/26/2019  Medication Sig  . acetaminophen (TYLENOL) 500 MG tablet Take 500 mg by mouth every 12 (twelve) hours as needed for mild pain.  Marland Kitchen amLODipine (NORVASC) 10 MG tablet Take 10 mg by mouth daily.   Marland Kitchen aspirin EC 81 MG EC tablet Take 1 tablet (81 mg total) by mouth daily.  Marland Kitchen B-Complex  TABS Take 1 tablet by mouth daily.  . calcitRIOL (ROCALTROL) 0.25 MCG capsule Take 0.25 mcg by mouth daily.   . calcium carbonate (OSCAL) 1500 (600 Ca) MG TABS tablet Take 600 mg of elemental calcium by mouth 2 (two) times daily.  . Cholecalciferol (VITAMIN D) 50 MCG (2000 UT) tablet Take 2,000 Units by mouth daily.  Marland Kitchen denosumab (PROLIA) 60 MG/ML SOSY injection Inject 60 mg into the skin every 6 (six) months.  . ferrous sulfate 325 (65 FE) MG tablet Take 325 mg by mouth daily with breakfast.  . furosemide (LASIX) 20 MG tablet Take 10-40 mg by mouth 2 (two) times daily. Take 2 tablets ('40mg'$ ) by mouth every morning and take  tablet ('10mg'$ ) by mouth at lunchtime  . JALYN 0.5-0.4 MG CAPS Take 1 capsule by mouth daily.   Marland Kitchen linagliptin (TRADJENTA) 5 MG TABS tablet Take 5 mg by mouth daily.  Marland Kitchen losartan (COZAAR) 25 MG tablet Take 0.5 tablets (12.5 mg total) by mouth 2 (two) times daily.  . Multiple Vitamin (MULTIVITAMIN) capsule Take 1 capsule by mouth daily.  . Nutritional Supplements (FEEDING SUPPLEMENT, NEPRO CARB STEADY,) LIQD Take 237 mLs by mouth 3 (three) times daily with meals.  . polyethylene glycol (MIRALAX / GLYCOLAX) 17 g packet Take 17  g by mouth daily.  . potassium chloride (KLOR-CON) 10 MEQ tablet Take 10 mEq by mouth daily.  Marland Kitchen saccharomyces boulardii (FLORASTOR) 250 MG capsule Take 250 mg by mouth 2 (two) times daily.  . sodium bicarbonate 650 MG tablet Take 650 mg by mouth 2 (two) times daily.  Marland Kitchen triamcinolone ointment (KENALOG) 0.1 % Apply 1 application topically 2 (two) times daily. (apply to arms and legs)   No facility-administered encounter medications on file as of 07/26/2019.    PHYSICAL EXAM:   General: NAD, frail appearing, thin, weak, pleasant male Cardiovascular: regular rate and rhythm Pulmonary: decrease bases, fine crackles Abdomen: soft, nontender, + bowel sounds Extremities: no edema, no joint deformities Neurological: generalized weakness, walks with  walker Fizza Scales Ihor Gully, NP

## 2019-08-02 DIAGNOSIS — R6510 Systemic inflammatory response syndrome (SIRS) of non-infectious origin without acute organ dysfunction: Secondary | ICD-10-CM

## 2019-08-02 DIAGNOSIS — R0603 Acute respiratory distress: Secondary | ICD-10-CM

## 2019-08-13 ENCOUNTER — Other Ambulatory Visit: Payer: Self-pay

## 2019-08-13 ENCOUNTER — Encounter: Payer: Self-pay | Admitting: Nurse Practitioner

## 2019-08-13 ENCOUNTER — Non-Acute Institutional Stay: Payer: Medicare Other | Admitting: Nurse Practitioner

## 2019-08-13 VITALS — HR 78 | Resp 18 | Wt 155.1 lb

## 2019-08-13 DIAGNOSIS — N184 Chronic kidney disease, stage 4 (severe): Secondary | ICD-10-CM

## 2019-08-13 DIAGNOSIS — Z515 Encounter for palliative care: Secondary | ICD-10-CM

## 2019-08-13 NOTE — Progress Notes (Signed)
[place] Consult Note Telephone: [phone]  Fax: [phone]  PATIENT NAME: [name] DOB: 01-27-1932 MRN: [id number]  PRIMARY CARE PROVIDER:   [name], MD  REFERRING PROVIDER:  [name], MD [address] [facility],  [place] [id number]   RESPONSIBLE PARTY:Daughter in [place] This visit and family meeting was done face to face in person  RECOMMENDATIONS and PLAN: 1.ACP:DNR, in Epic/Vynca; will continue PT with ongoing improvements, wishes are no dialysis but to treat what is treatable,  2.Palliative care encounter; Palliative medicine team will continue to support patient, patient's family, and medical team. Visit consisted of counseling and education dealing with the complex and emotionally intense issues of symptom management and palliative care in the setting of serious and potentially life-threatening illness  I spent 65 minutes providing this consultation,  from 12:00pm to 1:05pm. More than 50% of the time in this consultation was spent coordinating communication.   HISTORY OF PRESENT ILLNESS:  [name] is a 86 y.o. year old male with multiple medical problems including Dementia, Cancer, iron deficiency anemia , peripheral artery disease, chronic kidney disease, diabetes, heart murmur, hypertension, hyperlipidemia, retinal detachment, polypectomy, right intramedullary nail, hernia repair, eye surgery. Hospitalized 1 / 15 / 2021 to 1 / 20 / 2021 for systemic inflammatory response syndrome, acute hypoxic respiratory failure secondary to covid-19 positive bacterial pneumonia with acute metabolic encephalopathy secondary to dementia though Baseline upon discharge. Acute renal failure with chronic kidney disease stage 4 with Baseline creatinine 3.8 and 3.4 on day of discharge. Positive covid 1/4 / 2021. [name] did develop a stage 2 pressure ulcer sacrum, moderate protein calorie  malnutrition during hospitalization. [name] did receive remdesivir, dexamethasone with discharge on a prednisone taper. [name] returned to the hospital 1 / 21 / 2021 to 1 / 25 / 2021 for hyperosmolsr nonketotic state with type 2 diabetes.[name] continues to reside at [place] facility [facility]. [name] continues to reside at [place] [facility]. [name] continues to work with physical therapy, ambulating with a walker. [name] has been walking outside with assistance. [name] does continue to require assistance with ADLs. [name] is able the toilet himself the concerns for fall. [name] does feed himself though at times requires assistance. Appetite has been improving. Staff endorses overall he has been doing much better come and getting stronger. No recent falls. The wound on buttocks has resolved though he has developed a heel wound with staff has been working with. At present [name] is sitting in the recliner in his room eating lunch. [name] is feeding himself. We talked about purpose for palliative care visit. We talked about symptoms of pain and shortness of breath which [name]. Can is not experiencing. He no longer requires oxygen. We talked about in ambulating with his walker. We talked about the work that he has been doing with physical therapy. We talked about walking outside. We talked about toileting and adl's. We talked about feeding himself and his overall appetite which has been improving. We talked about residing at [facility]. [name]. [place] was cooperative with assessment. Emotional support provided. Schedule family meeting with [name] [place] daughter, son and daughter-in-law. I met [name] s daughter [name], son and daughter-in-law for face-to-face family meeting. [facility] Administrator also present. We talked about medical goals of care. We talked about clinical condition. We talked about [name] improving  significantly from last palliative care visit. We  talked about the work that he has been doing with physical therapy. We talked about goals to continue to get stronger and ambulate which has been his choice. We talked about going outside. [name] endorses the physical therapist is sending her videos of [name] outside walking with his walker. [name] endorses he seems to be very content when he is doing that. We talked about his appetite and food. [name] endorses that they actually change to the stress that speech recommended. He continues to get strangled or coughing while eating. [name] endorses that family wishes to continue on his regular diet. It was recommended for him to have mechanical soft though [name]. [place] does not want that and would not enjoy his food. [name] endorses family's fully aware of risks of aspiration and pneumonia and wishes to continue with focus on Pleasure food. We talked about medical goals of care. Wishes are to continue to treat what is tradable. [name] asked if it was okay to take [name] out of the facility for his follow-up Nephrology appointment. We talked about chronic kidney disease stage 4 where his GFR is 18. We talked about hospice criteria for chronic renal disease. We also talked about dialysis option. [name] endorses that [name] has decided that he does not want hemodialysis or peritoneal dialysis. We talked about increase in social exposure. We talked about [name] is going to the dining area every other day to eat although Monday the facility will be opening to where the floor will go to the dining room daily. We talked about role of palliative care and plan of care. Discuss that will follow up in 2 weeks to continue to monitor progression, follow with palliative care. [name] an agreement and appointment schedule. Therapeutic listening and emotional support provided. Contact information. Questions answered to satisfaction.   Palliative Care was asked to help to continue to address goals of care.   CODE  STATUS: DNR  PPS: 40% HOSPICE ELIGIBILITY/DIAGNOSIS: TBD  PAST MEDICAL HISTORY:  Past Medical History:  Diagnosis Date  . Cancer ([place])   . Chronic kidney disease   . Colon polyp   . Diabetes mellitus without complication ([place])   . Heart murmur 2008  . Hyperlipidemia   . Hypertension 1980  . Retinal detachment     SOCIAL HX:  Social History   Tobacco Use  . Smoking status: Never Smoker  . Smokeless tobacco: Never Used  Substance Use Topics  . Alcohol use: No    ALLERGIES:  Allergies  Allergen Reactions  . Meperidine Other (See Comments)    Severe bradycardia Severe bradycardia   . Other     Darvocet-N Lowers heart rate  . Penicillins Rash     PERTINENT MEDICATIONS:  Outpatient Encounter Medications as of 08/13/2019  Medication Sig  . acetaminophen (TYLENOL) 500 MG tablet Take 500 mg by mouth every 12 (twelve) hours as needed for mild pain.  Marland Kitchen amLODipine (NORVASC) 10 MG tablet Take 10 mg by mouth daily.   Marland Kitchen aspirin EC 81 MG EC tablet Take 1 tablet (81 mg total) by mouth daily.  Marland Kitchen B-Complex TABS Take 1 tablet by mouth daily.  . calcitRIOL (ROCALTROL) 0.25 MCG capsule Take 0.25 mcg by mouth daily.   . calcium carbonate (OSCAL) 1500 (600 Ca) MG TABS tablet Take 600 mg of elemental calcium by mouth 2 (two) times daily.  . Cholecalciferol (VITAMIN D) 50 MCG (2000 UT) tablet Take 2,000 Units by mouth daily.  Marland Kitchen  denosumab (PROLIA) 60 MG/ML SOSY injection Inject 60 mg into the skin every 6 (six) months.  . ferrous sulfate 325 (65 FE) MG tablet Take 325 mg by mouth daily with breakfast.  . furosemide (LASIX) 20 MG tablet Take 10-40 mg by mouth 2 (two) times daily. Take 2 tablets (66m) by mouth every morning and take  tablet (169m by mouth at lunchtime  . JALYN 0.5-0.4 MG CAPS Take 1 capsule by mouth daily.   . Marland Kitcheninagliptin (TRADJENTA) 5 MG TABS tablet Take 5 mg by mouth daily.  . Marland Kitchenosartan (COZAAR) 25 MG tablet Take 0.5 tablets (12.5 mg total) by mouth 2 (two) times daily.    . Multiple Vitamin (MULTIVITAMIN) capsule Take 1 capsule by mouth daily.  . Nutritional Supplements (FEEDING SUPPLEMENT, NEPRO CARB STEADY,) LIQD Take 237 mLs by mouth 3 (three) times daily with meals.  . polyethylene glycol (MIRALAX / GLYCOLAX) 17 g packet Take 17 g by mouth daily.  . potassium chloride (KLOR-CON) 10 MEQ tablet Take 10 mEq by mouth daily.  . Marland Kitchenaccharomyces boulardii (FLORASTOR) 250 MG capsule Take 250 mg by mouth 2 (two) times daily.  . sodium bicarbonate 650 MG tablet Take 650 mg by mouth 2 (two) times daily.  . Marland Kitchenriamcinolone ointment (KENALOG) 0.1 % Apply 1 application topically 2 (two) times daily. (apply to arms and legs)   No facility-administered encounter medications on file as of 08/13/2019.    PHYSICAL EXAM:   General: NAD, frail appearing, thin, elderly male Cardiovascular: regular rate and rhythm Pulmonary: clear ant fields Abdomen: soft, nontender, + bowel sounds Extremities: no edema, no joint deformities Neurological: walks with walker   Z Ihor GullyNP

## 2019-08-26 ENCOUNTER — Encounter: Payer: Medicare Other | Attending: Physician Assistant | Admitting: Physician Assistant

## 2019-08-26 ENCOUNTER — Other Ambulatory Visit: Payer: Self-pay

## 2019-08-26 DIAGNOSIS — E1122 Type 2 diabetes mellitus with diabetic chronic kidney disease: Secondary | ICD-10-CM | POA: Diagnosis not present

## 2019-08-26 DIAGNOSIS — N186 End stage renal disease: Secondary | ICD-10-CM | POA: Diagnosis not present

## 2019-08-26 DIAGNOSIS — S31819A Unspecified open wound of right buttock, initial encounter: Secondary | ICD-10-CM | POA: Insufficient documentation

## 2019-08-26 DIAGNOSIS — L89620 Pressure ulcer of left heel, unstageable: Secondary | ICD-10-CM | POA: Diagnosis not present

## 2019-08-26 DIAGNOSIS — X58XXXA Exposure to other specified factors, initial encounter: Secondary | ICD-10-CM | POA: Diagnosis not present

## 2019-08-26 DIAGNOSIS — E1151 Type 2 diabetes mellitus with diabetic peripheral angiopathy without gangrene: Secondary | ICD-10-CM | POA: Diagnosis not present

## 2019-08-26 DIAGNOSIS — I120 Hypertensive chronic kidney disease with stage 5 chronic kidney disease or end stage renal disease: Secondary | ICD-10-CM | POA: Insufficient documentation

## 2019-08-26 DIAGNOSIS — E11621 Type 2 diabetes mellitus with foot ulcer: Secondary | ICD-10-CM | POA: Diagnosis not present

## 2019-08-26 NOTE — Progress Notes (Signed)
[name], [name] ([id number]) Visit Report for 08/26/2019 Abuse/Suicide Risk Screen Details Patient Name: [name], [name] Date of Service: 08/26/2019 9:45 AM Medical Record Number: [id number] Patient Account Number: [id number] Date of Birth/Sex: January 21, 1932 (86 y.o. M) Treating RN: Montey Hora Primary Care Sakoya Win: [name] Other Clinician: Referring Artie Takayama: Nicolette Bang Treating Elsworth Ledin/Extender: Melburn Hake, HOYT Weeks in Treatment: 0 Abuse/Suicide Risk Screen Items Answer ABUSE RISK SCREEN: Has anyone close to you tried to hurt or harm you recentlyo No Do you feel uncomfortable with anyone in your familyo No Has anyone forced you do things that you didnot want to doo No Electronic Signature(s) Signed: 08/26/2019 4:33:07 PM By: Montey Hora Entered By: Montey Hora on 08/26/2019 09:51:34 Ontko, [name] ([id number]) -------------------------------------------------------------------------------- Activities of Daily Living Details Patient Name: [name] Date of Service: 08/26/2019 9:45 AM Medical Record Number: [id number] Patient Account Number: [id number] Date of Birth/Sex: February 19, 1932 (87 y.o. M) Treating RN: Montey Hora Primary Care Kayde Atkerson: [name] Other Clinician: Referring Yaacov Koziol: Nicolette Bang Treating Ida Milbrath/Extender: Melburn Hake, HOYT Weeks in Treatment: 0 Activities of Daily Living Items Answer Activities of Daily Living (Please select one for each item) Drive Automobile Not Able Take Medications Need Assistance Use Telephone Need Assistance Care for Appearance Need Assistance Use Toilet Need Assistance Bath / Shower Not Able Dress Self Need Assistance Feed Self Completely Able Walk Need Assistance Get In / Out Bed Need Assistance Housework Not Able Prepare Meals Not Able Handle Money Need Assistance Shop for Self Not Able Electronic Signature(s) Signed: 08/26/2019 4:33:07 PM By: Montey Hora Entered By: Montey Hora on  08/26/2019 09:52:10 [place], [name] ([id number]) -------------------------------------------------------------------------------- Education Screening Details Patient Name: [name] Date of Service: 08/26/2019 9:45 AM Medical Record Number: [id number] Patient Account Number: [id number] Date of Birth/Sex: 04/20/1932 (87 y.o. M) Treating RN: Montey Hora Primary Care Timtohy Broski: [name] Other Clinician: Referring Cleon Signorelli: Nicolette Bang Treating Juriel Cid/Extender: Melburn Hake, HOYT Weeks in Treatment: 0 Primary Learner Assessed: Patient Learning Preferences/Education Level/Primary Language Learning Preference: Explanation, Demonstration Highest Education Level: High School Preferred Language: English Cognitive Barrier Language Barrier: No Translator Needed: No Memory Deficit: No Emotional Barrier: No Cultural/Religious Beliefs Affecting Medical Care: No Physical Barrier Impaired Vision: No Impaired Hearing: No Decreased Hand dexterity: No Knowledge/Comprehension Knowledge Level: Medium Comprehension Level: Medium Ability to understand written instructions: Medium Ability to understand verbal instructions: Medium Motivation Anxiety Level: Calm Cooperation: Cooperative Education Importance: Acknowledges Need Interest in Health Problems: Asks Questions Perception: Coherent Willingness to Engage in Self-Management Medium Activities: Readiness to Engage in Self-Management Medium Activities: Electronic Signature(s) Signed: 08/26/2019 4:33:07 PM By: Montey Hora Entered By: Montey Hora on 08/26/2019 09:52:41 Blouch, [name] ([id number]) -------------------------------------------------------------------------------- Fall Risk Assessment Details Patient Name: [name] Date of Service: 08/26/2019 9:45 AM Medical Record Number: [id number] Patient Account Number: [id number] Date of Birth/Sex: 1932-04-26 (87 y.o. M) Treating RN: Montey Hora Primary Care  Wilbur Labuda: [name] Other Clinician: Referring Tevon Berhane: Nicolette Bang Treating Amilya Haver/Extender: Melburn Hake, HOYT Weeks in Treatment: 0 Fall Risk Assessment Items Have you had 2 or more falls in the last 12 monthso 0 No Have you had any fall that resulted in injury in the last 12 monthso 0 No FALLS RISK SCREEN History of falling - immediate or within 3 months 0 No Secondary diagnosis (Do you have 2 or more medical diagnoseso) 0 No Ambulatory aid None/bed rest/wheelchair/nurse 0 No Crutches/cane/walker 15 Yes Furniture 0 No Intravenous therapy Access/Saline/Heparin Lock 0 No Gait/Transferring Normal/ bed rest/ wheelchair 0 No Weak (short steps  with or without shuffle, stooped but able to lift head while walking, may seek 10 Yes support from furniture) Impaired (short steps with shuffle, may have difficulty arising from chair, head down, impaired 20 Yes balance) Mental Status Oriented to own ability 0 Yes Electronic Signature(s) Signed: 08/26/2019 4:33:07 PM By: Montey Hora Entered By: Montey Hora on 08/26/2019 09:53:05 Mcdaid, [name] ([id number]) -------------------------------------------------------------------------------- Foot Assessment Details Patient Name: [name] Date of Service: 08/26/2019 9:45 AM Medical Record Number: [id number] Patient Account Number: [id number] Date of Birth/Sex: 03-23-1932 (87 y.o. M) Treating RN: Montey Hora Primary Care Ashla Murph: [name] Other Clinician: Referring TRUE Garciamartinez: Nicolette Bang Treating Dereke Neumann/Extender: Melburn Hake, HOYT Weeks in Treatment: 0 Foot Assessment Items Site Locations + = Sensation present, - = Sensation absent, C = Callus, U = Ulcer R = Redness, W = Warmth, M = Maceration, PU = Pre-ulcerative lesion F = Fissure, S = Swelling, D = Dryness Assessment Right: Left: Other Deformity: No No Prior Foot Ulcer: No No Prior Amputation: No No Charcot Joint: No No Ambulatory Status:  Non-ambulatory Assistance Device: Wheelchair GaitEnergy manager) Signed: 08/26/2019 4:33:07 PM By: Montey Hora Entered By: Montey Hora on 08/26/2019 09:53:23 Laflamme, [name] ([id number]) -------------------------------------------------------------------------------- Nutrition Risk Screening Details Patient Name: [name] Date of Service: 08/26/2019 9:45 AM Medical Record Number: [id number] Patient Account Number: [id number] Date of Birth/Sex: 1931/11/25 (87 y.o. M) Treating RN: Montey Hora Primary Care Ava Deguire: [name] Other Clinician: Referring Elam Ellis: Nicolette Bang Treating Neyra Pettie/Extender: Melburn Hake, HOYT Weeks in Treatment: 0 Height (in): 68 Weight (lbs): 145 Body Mass Index (BMI): 22 Nutrition Risk Screening Items Score Screening NUTRITION RISK SCREEN: I have an illness or condition that made me change the kind and/or amount of food I eat 0 No I eat fewer than two meals per day 0 No I eat few fruits and vegetables, or milk products 0 No I have three or more drinks of beer, liquor or wine almost every day 0 No I have tooth or mouth problems that make it hard for me to eat 0 No I don't always have enough money to buy the food I need 0 No I eat alone most of the time 0 No I take three or more different prescribed or over-the-counter drugs a day 1 Yes Without wanting to, I have lost or gained 10 pounds in the last six months 0 No I am not always physically able to shop, cook and/or feed myself 0 No Nutrition Protocols Good Risk Protocol 0 No interventions needed Moderate Risk Protocol High Risk Proctocol Risk Level: Good Risk Score: 1 Electronic Signature(s) Signed: 08/26/2019 4:33:07 PM By: Montey Hora Entered By: Montey Hora on 08/26/2019 09:53:12

## 2019-08-27 ENCOUNTER — Non-Acute Institutional Stay: Payer: Medicare Other | Admitting: Nurse Practitioner

## 2019-08-27 ENCOUNTER — Encounter: Payer: Self-pay | Admitting: Nurse Practitioner

## 2019-08-27 DIAGNOSIS — Z515 Encounter for palliative care: Secondary | ICD-10-CM

## 2019-08-27 DIAGNOSIS — N184 Chronic kidney disease, stage 4 (severe): Secondary | ICD-10-CM

## 2019-08-27 NOTE — Progress Notes (Signed)
[name], [name] ([id number]) Visit Report for 08/26/2019 Allergy List Details Patient Name: [name], [name] Date of Service: 08/26/2019 9:45 AM Medical Record Number: [id number] Patient Account Number: [id number] Date of Birth/Sex: Sep 06, 1931 (86 y.o. M) Treating RN: [name] Primary Care [name]: [name] Other Clinician: Referring [name]: [name] Treating [name]/Extender: [name], [name] in Treatment: 0 Allergies Active Allergies penicillin Reaction: hives Severity: Mild meperidine HCl Allergy Notes Electronic Signature(s) Signed: 08/26/2019 4:33:07 PM By: [name] Entered By: [name] on 08/26/2019 09:49:37 [name], [name] ([id number]) -------------------------------------------------------------------------------- Arrival Information Details Patient Name: [name] Date of Service: 08/26/2019 9:45 AM Medical Record Number: [id number] Patient Account Number: [id number] Date of Birth/Sex: 30-Jun-1931 (87 y.o. M) Treating RN: [name] Primary Care Zamara Cozad: [name] Other Clinician: Referring [name]: [name] Treating [name]/Extender: [name], [name] in Treatment: 0 Visit Information Patient Arrived: Wheel Chair Arrival Time: 09:39 Accompanied By: daughter Transfer Assistance: Manual Patient Identification Verified: Yes Secondary Verification Process Completed: Yes History Since Last Visit Added or deleted any medications: No Any new allergies or adverse reactions: No Had a fall or experienced change in activities of daily living that may affect risk of falls: No Signs or symptoms of abuse/neglect since last visito No Hospitalized since last visit: No Implantable device outside of the clinic excluding cellular tissue based products placed in the center since last visit: No Electronic Signature(s) Signed: 08/26/2019 3:52:07 PM By: [name] RCP, RRT, CHT Entered By: [name] on 08/26/2019 09:40:18 [name], [name] ([id number]) -------------------------------------------------------------------------------- Clinic Level of Care Assessment Details Patient Name: [name] Date of Service: 08/26/2019 9:45 AM Medical Record Number: [id number] Patient Account Number: [id number] Date of Birth/Sex: November 18, 1931 (86 y.o. M) Treating RN: [name] Primary Care Kodi Guerrera: [name] Other Clinician: Referring [name]: [name] Treating [name]/Extender: [name], [name] in Treatment: 0 Clinic Level of Care Assessment Items TOOL 2 Quantity Score []  - Use when only an EandM is performed on the INITIAL visit 0 ASSESSMENTS - Nursing Assessment / Reassessment X - General Physical Exam (combine w/ comprehensive assessment (listed just below) when performed on new pt. 1 20 evals) X- 1 25 Comprehensive Assessment (HX, ROS, Risk Assessments, Wounds Hx, etc.) ASSESSMENTS - Wound and Skin Assessment / Reassessment X - Simple Wound Assessment / Reassessment - one wound 1 5 []  - 0 Complex Wound Assessment / Reassessment - multiple wounds []  - 0 Dermatologic / Skin Assessment (not related to wound area) ASSESSMENTS - Ostomy and/or Continence Assessment and Care []  - Incontinence Assessment and Management 0 []  - 0 Ostomy Care Assessment and Management (repouching, etc.) PROCESS - Coordination of Care X - Simple Patient / Family Education for ongoing care 1 15 []  - 0 Complex (extensive) Patient / Family Education for ongoing care []  - 0 Staff obtains Programmer, systems, Records, Test Results / Process Orders []  - 0 Staff telephones HHA, Nursing Homes / Clarify orders / etc []  - 0 Routine Transfer to another Facility (non-emergent condition) []  - 0 Routine Hospital Admission (non-emergent condition) X- 1 15 New Admissions / Biomedical engineer / Ordering NPWT, Apligraf, etc. []  - 0 Emergency Hospital Admission (emergent condition) X- 1 10 Simple  Discharge Coordination []  - 0 Complex (extensive) Discharge Coordination PROCESS - Special Needs []  - Pediatric / Minor Patient Management 0 []  - 0 Isolation Patient Management []  - 0 Hearing / Language / Visual special needs []  - 0 Assessment of Community assistance (transportation, D/C planning, etc.) []  -  0 Additional assistance / Altered mentation []  - 0 Support Surface(s) Assessment (bed, cushion, seat, etc.) INTERVENTIONS - Wound Cleansing / Measurement X - Wound Imaging (photographs - any number of wounds) 1 5 [name], [name] R. ([id number]) []  - 0 Wound Tracing (instead of photographs) []  - 0 Simple Wound Measurement - one wound X- 2 5 Complex Wound Measurement - multiple wounds []  - 0 Simple Wound Cleansing - one wound X- 2 5 Complex Wound Cleansing - multiple wounds INTERVENTIONS - Wound Dressings []  - Small Wound Dressing one or multiple wounds 0 []  - 0 Medium Wound Dressing one or multiple wounds X- 2 20 Large Wound Dressing one or multiple wounds []  - 0 Application of Medications - injection INTERVENTIONS - Miscellaneous []  - External ear exam 0 []  - 0 Specimen Collection (cultures, biopsies, blood, body fluids, etc.) []  - 0 Specimen(s) / Culture(s) sent or taken to Lab for analysis []  - 0 Patient Transfer (multiple staff / Civil Service fast streamer / Similar devices) []  - 0 Simple Staple / Suture removal (25 or less) []  - 0 Complex Staple / Suture removal (26 or more) []  - 0 Hypo / Hyperglycemic Management (close monitor of Blood Glucose) X- 1 15 Ankle / Brachial Index (ABI) - do not check if billed separately Has the patient been seen at the hospital within the last three years: Yes Total Score: 170 Level Of Care: New/Established - Level 5 Electronic Signature(s) Signed: 08/27/2019 5:21:44 PM By: [name], BSN, RN, CWS, Kim RN, BSN Entered By: [name] on 08/26/2019 10:40:47 [name], [name]  ([id number]) -------------------------------------------------------------------------------- Encounter Discharge Information Details Patient Name: [name] Date of Service: 08/26/2019 9:45 AM Medical Record Number: [id number] Patient Account Number: [id number] Date of Birth/Sex: Jul 10, 1931 (87 y.o. M) Treating RN: [name] Primary Care Tsutomu Barfoot: [name] Other Clinician: Referring Una Yeomans: [name] Treating Kathrene Sinopoli/Extender: [name], [name] in Treatment: 0 Encounter Discharge Information Items Discharge Condition: Stable Ambulatory Status: Walker Discharge Destination: Home Transportation: Private Auto Accompanied By: daughter Schedule Follow-up Appointment: Yes Clinical Summary of Care: Electronic Signature(s) Signed: 08/27/2019 5:21:44 PM By: [name], BSN, RN, CWS, Kim RN, BSN Entered By: [name] on 08/26/2019 10:42:06 ZEESHAN, KORTE ([id number]) -------------------------------------------------------------------------------- Lower Extremity Assessment Details Patient Name: [name] Date of Service: 08/26/2019 9:45 AM Medical Record Number: [id number] Patient Account Number: [id number] Date of Birth/Sex: 08-Aug-1931 (87 y.o. M) Treating RN: [name] Primary Care Dynastee Brummell: [name] Other Clinician: Referring Bryonna Sundby: [name] Treating Floria Brandau/Extender: [name], [name] in Treatment: 0 Edema Assessment Assessed: [Left: No] [Right: No] Edema: [Left: No] [Right: No] Vascular Assessment Pulses: Dorsalis Pedis Palpable: [Left:Yes] [Right:Yes] Doppler Audible: [Left:Yes] [Right:Yes] Posterior Tibial Palpable: [Left:No Yes] [Right:No Yes] Electronic Signature(s) Signed: 08/26/2019 4:33:07 PM By: [name] Entered By: [name] on 08/26/2019 10:12:53 Kendzierski, [name] ([id number]) -------------------------------------------------------------------------------- Multi Wound Chart Details Patient Name:  [name] Date of Service: 08/26/2019 9:45 AM Medical Record Number: [id number] Patient Account Number: [id number] Date of Birth/Sex: 20-Aug-1931 (87 y.o. M) Treating RN: [name] Primary Care Aavya Shafer: [name] Other Clinician: Referring Sydnei Ohaver: [name] Treating Kriya Westra/Extender: [name], [name] in Treatment: 0 Vital Signs Height(in): 17 Pulse(bpm): 12 Weight(lbs): 145 Blood Pressure(mmHg): 131/60 Body Mass Index(BMI): 22 Temperature(F): 97.7 Respiratory Rate(breaths/min): 16 Photos: [N/A:N/A] Wound Location: Left Calcaneus Right Gluteus N/A Wounding Event: Gradually Appeared Shear/Friction N/A Primary Etiology: Pressure Ulcer To be determined N/A Comorbid History: Cataracts, Hypertension, Peripheral Cataracts, Hypertension, Peripheral N/A Venous Disease, Type II  Diabetes, Venous Disease, Type II Diabetes, End Stage Renal Disease End Stage Renal Disease Date Acquired: 07/26/2019 08/23/2019 N/A [name] of Treatment: 0 0 N/A Wound Status: Open Open N/A Measurements L x W x D (cm) 1.3x1.4x0.1 1.5x1.7x0.1 N/A Area (cm) : 1.429 2.003 N/A Volume (cm) : 0.143 0.2 N/A Classification: Unstageable/Unclassified Partial Thickness N/A Exudate Amount: Medium Medium N/A Exudate Type: Serous Serous N/A Exudate Color: amber amber N/A Wound Margin: Flat and Intact Flat and Intact N/A Granulation Amount: None Present (0%) Large (67-100%) N/A Granulation Quality: N/A [name] N/A Necrotic Amount: Large (67-100%) None Present (0%) N/A Necrotic Tissue: Eschar, Adherent Slough N/A N/A Exposed Structures: Fat Layer (Subcutaneous Tissue) Fascia: No N/A Exposed: Yes Fat Layer (Subcutaneous Tissue) Fascia: No Exposed: No Tendon: No Tendon: No Muscle: No Muscle: No Joint: No Joint: No Bone: No Bone: No Limited to Skin Breakdown Epithelialization: None Medium (34-66%) N/A Treatment Notes Electronic Signature(s) Signed: 08/27/2019 5:21:44 PM By: [name], BSN, RN, CWS,  Kim RN, BSN Entered By: [name] on 08/26/2019 10:37:07 HENRICK, MCGUE ([id number]) ZONG, MCQUARRIE ([id number]) -------------------------------------------------------------------------------- Multi-Disciplinary Care Plan Details Patient Name: [name] Date of Service: 08/26/2019 9:45 AM Medical Record Number: [id number] Patient Account Number: [id number] Date of Birth/Sex: 1932-04-21 (87 y.o. M) Treating RN: [name] Primary Care Chijioke Lasser: [name] Other Clinician: Referring Beckem Tomberlin: [name] Treating Magdaleno Lortie/Extender: [name], [name] in Treatment: 0 Active Inactive Abuse / Safety / Falls / Self Care Management Nursing Diagnoses: Impaired physical mobility Goals: Patient/caregiver will verbalize understanding of skin care regimen Date Initiated: 08/26/2019 Target Resolution Date: 09/08/2019 Goal Status: Active Interventions: Assess fall risk on admission and as needed Notes: Orientation to the Wound Care Program Nursing Diagnoses: Knowledge deficit related to the wound healing center program Goals: Patient/caregiver will verbalize understanding of the [facility] Program Date Initiated: 08/26/2019 Target Resolution Date: 09/08/2019 Goal Status: Active Interventions: Provide education on orientation to the wound center Notes: Pressure Nursing Diagnoses: Knowledge deficit related to causes and risk factors for pressure ulcer development Goals: Patient will remain free from development of additional pressure ulcers Date Initiated: 08/26/2019 Target Resolution Date: 09/08/2019 Goal Status: Active Interventions: Assess: immobility, friction, shearing, incontinence upon admission and as needed Notes: Wound/Skin Impairment Nursing Diagnoses: Impaired tissue integrity Rosero, Shrey R. ([id number]) Goals: Ulcer/skin breakdown will have a volume reduction of 30% by week 4 Date Initiated: 08/26/2019 Target Resolution Date:  09/26/2019 Goal Status: Active Interventions: Assess patient/caregiver ability to obtain necessary supplies Treatment Activities: Skin care regimen initiated : 08/26/2019 Notes: Electronic Signature(s) Signed: 08/27/2019 5:21:44 PM By: [name], BSN, RN, CWS, Kim RN, BSN Entered By: [name] on 08/26/2019 10:36:55 Castelluccio, [name] ([id number]) -------------------------------------------------------------------------------- Pain Assessment Details Patient Name: [name] Date of Service: 08/26/2019 9:45 AM Medical Record Number: [id number] Patient Account Number: [id number] Date of Birth/Sex: 17-Jun-1931 (87 y.o. M) Treating RN: [name] Primary Care Rinoa Garramone: [name] Other Clinician: Referring Saw Mendenhall: [name] Treating Merrik Puebla/Extender: [name], [name] in Treatment: 0 Active Problems Location of Pain Severity and Description of Pain Patient Has Paino No Site Locations Pain Management and Medication Current Pain Management: Electronic Signature(s) Signed: 08/26/2019 3:52:07 PM By: [name] RCP, RRT, CHT Signed: 08/27/2019 5:21:44 PM By: [name], BSN, RN, CWS, Kim RN, BSN Entered By: [name] on 08/26/2019 09:40:54 SULIMAN, TERMINI ([id number]) -------------------------------------------------------------------------------- Patient/Caregiver Education Details Patient Name: [name] Date of Service: 08/26/2019 9:45 AM Medical Record Number: [id number] Patient Account Number:  [id number] Date of Birth/Gender: 02-05-1932 (86 y.o. M) Treating RN: [name] Primary Care Physician: [name] Other Clinician: Referring Physician: Nicolette [name] Treating Physician/Extender: Sharalyn Ink in Treatment: 0 Education Assessment Education Provided To: Patient Education Topics Provided Welcome [facility]: Handouts: [facility] Methods: Demonstration,  Explain/Verbal Responses: State content correctly Wound/Skin Impairment: Electronic Signature(s) Signed: 08/27/2019 5:21:44 PM By: [name], BSN, RN, CWS, Kim RN, BSN Entered By: [name] on 08/26/2019 10:41:02 JAYKE, CAUL ([id number]) -------------------------------------------------------------------------------- Wound Assessment Details Patient Name: [name] Date of Service: 08/26/2019 9:45 AM Medical Record Number: [id number] Patient Account Number: [id number] Date of Birth/Sex: 10/11/1931 (87 y.o. M) Treating RN: [name] Primary Care Alee Katen: [name] Other Clinician: Referring Aishia Barkey: [name] Treating Ramya Vanbergen/Extender: [name], [name] in Treatment: 0 Wound Status Wound Number: 2 Primary Pressure Ulcer Etiology: Wound Location: Left Calcaneus Wound Open Wounding Event: Gradually Appeared Status: Date Acquired: 07/26/2019 Comorbid Cataracts, Hypertension, Peripheral Venous Disease, Type [name] Of Treatment: 0 History: II Diabetes, End Stage Renal Disease Clustered Wound: No Photos Photo Uploaded By: [name] on 08/26/2019 10:05:41 Wound Measurements Length: (cm) 1.3 % Red Width: (cm) 1.4 % Red Depth: (cm) 0.1 Epith Area: (cm) 1.429 Tunn Volume: (cm) 0.143 Unde uction in Area: uction in Volume: elialization: None eling: No rmining: No Wound Description Classification: Unstageable/Unclassified Foul Wound Margin: Flat and Intact Slou Exudate Amount: Medium Exudate Type: Serous Exudate Color: amber Odor After Cleansing: No gh/Fibrino Yes Wound Bed Granulation Amount: None Present (0%) Exposed Structure Necrotic Amount: Large (67-100%) Fascia Exposed: No Necrotic Quality: Eschar, Adherent Slough Fat Layer (Subcutaneous Tissue) Exposed: Yes Tendon Exposed: No Muscle Exposed: No Joint Exposed: No Bone Exposed: No Treatment Notes Wound #2 (Left Calcaneus) Notes Heel iodoflex, Glut collagen Electronic  Signature(s) KINAN, SAFLEY ([id number]) Signed: 08/26/2019 4:33:07 PM By: [name] Entered By: [name] on 08/26/2019 10:00:41 Mattioli, [name] ([id number]) -------------------------------------------------------------------------------- Wound Assessment Details Patient Name: [name] Date of Service: 08/26/2019 9:45 AM Medical Record Number: [id number] Patient Account Number: [id number] Date of Birth/Sex: 1932-05-13 (87 y.o. M) Treating RN: [name] Primary Care Aiyonna Lucado: [name] Other Clinician: Referring Eban Weick: [name] Treating Kyra Laffey/Extender: [name], [name] in Treatment: 0 Wound Status Wound Number: 3 Primary To be determined Etiology: Wound Location: Right Gluteus Wound Open Wounding Event: Shear/Friction Status: Date Acquired: 08/23/2019 Comorbid Cataracts, Hypertension, Peripheral Venous Disease, Type [name] Of Treatment: 0 History: II Diabetes, End Stage Renal Disease Clustered Wound: No Photos Photo Uploaded By: [name] on 08/26/2019 10:05:42 Wound Measurements Length: (cm) 1.5 % Re Width: (cm) 1.7 % Re Depth: (cm) 0.1 Epit Area: (cm) 2.003 Tun Volume: (cm) 0.2 Und duction in Area: duction in Volume: helialization: Medium (34-66%) neling: No ermining: No Wound Description Classification: Partial Thickness Foul Wound Margin: Flat and Intact Slou Exudate Amount: Medium Exudate Type: Serous Exudate Color: amber Odor After Cleansing: No gh/Fibrino No Wound Bed Granulation Amount: Large (67-100%) Exposed Structure Granulation Quality: [name] Fascia Exposed: No Necrotic Amount: None Present (0%) Fat Layer (Subcutaneous Tissue) Exposed: No Tendon Exposed: No Muscle Exposed: No Joint Exposed: No Bone Exposed: No Limited to Skin Breakdown Treatment Notes Wound #3 (Right Gluteus) Notes Heel iodoflex, Glut collagen STEFON, RAMTHUN ([id number]) Electronic Signature(s) Signed: 08/26/2019 4:33:07 PM By:  [name] Entered By: [name] on 08/26/2019 10:03:29 Cutter, [name] ([id number]) -------------------------------------------------------------------------------- Vitals Details Patient Name: [name] Date of Service: 08/26/2019 9:45 AM Medical Record  Number: [id number] Patient Account Number: [id number] Date of Birth/Sex: 18-Jun-1931 (86 y.o. M) Treating RN: [name] Primary Care Alondra Sahni: [name] Other Clinician: Referring Evangelene Vora: [name] Treating Becky Berberian/Extender: [name], [name] in Treatment: 0 Vital Signs Time Taken: 09:35 Temperature (F): 97.7 Height (in): 68 Pulse (bpm): 84 Source: Stated Respiratory Rate (breaths/min): 16 Weight (lbs): 145 Blood Pressure (mmHg): 131/60 Source: Stated Reference Range: 80 - 120 mg / dl Body Mass Index (BMI): 22 Electronic Signature(s) Signed: 08/26/2019 3:52:07 PM By: [name] RCP, RRT, CHT Entered By: [name] on 08/26/2019 09:41:32

## 2019-08-27 NOTE — Progress Notes (Signed)
[facility] Consult Note Telephone: [phone]  Fax: [phone]  PATIENT NAME: [name] DOB: Mar 10, 1932 MRN: [id number]  PRIMARY CARE PROVIDER:   [name], MD RESPONSIBLE PARTY:Daughter in [place] Bees This visit and family meeting was done face to face in person  RECOMMENDATIONS and PLAN: 1.ACP:DNR, in Epic/Vynca; will continue PT with ongoing improvements, wishes are no dialysis but to treat what is treatable,  2.Palliative care encounter; Palliative medicine team will continue to support patient, patient's family, and medical team. Visit consisted of counseling and education dealing with the complex and emotionally intense issues of symptom management and palliative care in the setting of serious and potentially life-threatening illness  I spent 65 minutes providing this consultation,  From 11:55am to 1:00pm. More than 50% of the time in this consultation was spent coordinating communication.   HISTORY OF PRESENT ILLNESS:  [name] is a 86 y.o. year old male with multiple medical problems including Dementia, Cancer, iron deficiency anemia , peripheral artery disease, chronic kidney disease, diabetes, heart murmur, hypertension, hyperlipidemia, retinal detachment, polypectomy, right intramedullary nail, hernia repair, eye surgery. [name] continues to reside at [facility] facility. [name] is doing considerably better with ambulating with a walker. [name] continues to work with therapy and doing very well. No recent falls. No further hospitalizations or infections. [name] go to wound care center for sacral wound and left heel. Wound center is referring back to vascular for decreased pressure in that left lower leg. Staff endorses [name] has been improving considerably. [name] appetite has gotten better. [name] does continue to get up during the night at times. [name] does continue to  require oxygen intermittently but less than last palliative visit. [name] does continue to be a active participant with positive motivation for continuing to want to walk. Staff endorses [name] does go to the dining hall for each meal. Have present [name] is sitting in the recliner in this room. [name] does appear comfortable. Merry Proud the physical therapist is with him. [name] and I talked about purpose of palliative care visit. [name] agreement. We talked about the work that [name]. John has been doing with Merry Proud, ambulating. We talked about wound care visit. [name]. Mostafa and I continue to talk about how [name] is feeling today. [name]. Rigor smiled endorsing he is doing well. We talked about symptoms of pain or shortness of breath which he did. We talked about his oxygen what she is not required today. We  talked about sleep pattern. We talked about sleep hygiene. We talked about medical goals. We talked about family dynamics, visiting with family. We talked about role of palliative care. Emotional support provided. [name] Davie will still cooperative with assessment. Discuss family meeting today with [name]. Moradi in agreement for update. I met with [name], [name] Loeper daughter-in-law, son and daughter separate from [name] Mudgett. We talked about clinical update. We talked about palliative care visit with [name]. Mcclanahan. We talked about [name]. Rorke functional progress walking. We talked about [name]. Rolfson improved cognition and memory. We talked about symptoms. We talked about his appetite. We talked about the wound and the visit to the wound care center which his daughter accompanied him. We talked about follow-up appointment with that circular. We talked about nutrition supplement and nephro vs. Glucerna with chronic kidney disease. We talked about upcoming appointment with Dr. Radene Knee nephrologist. We talked about medical  goals of care to continue to treat with his treatable the minimize suffering. We talked about at present time GregoryFelton Clinton  continues to improve. Will continue to follow a monitor with next palliative is it in 2 weeks or sooner should he declined. Family in agreement. Appointment schedule. Therapeutic listening in emotional support provided. Questions answered to satisfaction. Contact information. I updated staff Palliative Care was asked to help to continue address goals of care.   CODE STATUS: DNR  PPS: 40% HOSPICE ELIGIBILITY/DIAGNOSIS: TBD  PAST MEDICAL HISTORY:  Past Medical History:  Diagnosis Date  . Cancer ([place])   . Chronic kidney disease   . Colon polyp   . Diabetes mellitus without complication ([place])   . Heart murmur 2008  . Hyperlipidemia   . Hypertension 1980  . Retinal detachment     SOCIAL HX:  Social History   Tobacco Use  . Smoking status: Never Smoker  . Smokeless tobacco: Never Used  Substance Use Topics  . Alcohol use: No    ALLERGIES:  Allergies  Allergen Reactions  . Meperidine Other (See Comments)    Severe bradycardia Severe bradycardia   . Other     Darvocet-N Lowers heart rate  . Penicillins Rash     PERTINENT MEDICATIONS:  Outpatient Encounter Medications as of 08/27/2019  Medication Sig  . acetaminophen (TYLENOL) 500 MG tablet Take 500 mg by mouth every 12 (twelve) hours as needed for mild pain.  [place] Kitchen amLODipine (NORVASC) 10 MG tablet Take 10 mg by mouth daily.   Mar[place] Kitchen aspirin EC 81 MG EC tablet Take 1 tablet (81 mg total) by mouth daily.  Mar[place] Kitchen B-Complex TABS Take 1 tablet by mouth daily.  . calcitRIOL (ROCALTROL) 0.25 MCG capsule Take 0.25 mcg by mouth daily.   . calcium carbonate (OSCAL) 1500 (600 Ca) MG TABS tablet Take 600 mg of elemental calcium by mouth 2 (two) times daily.  . Cholecalciferol (VITAMIN D) 50 MCG (2000 UT) tablet Take 2,000 Units by mouth daily.  Marland Kitchen denosumab (PROLIA) 60 MG/ML SOSY injection Inject 60 mg into the skin every 6 (six) months.  . ferrous sulfate 325 (65 FE) MG tablet Take 325 mg by mouth daily with breakfast.  . furosemide (LASIX) 20 MG  tablet Take 10-40 mg by mouth 2 (two) times daily. Take 2 tablets ('40mg'$ ) by mouth every morning and take  tablet ('10mg'$ ) by mouth at lunchtime  . JALYN 0.5-0.4 MG CAPS Take 1 capsule by mouth daily.   Marland Kitchen linagliptin (TRADJENTA) 5 MG TABS tablet Take 5 mg by mouth daily.  Marland Kitchen losartan (COZAAR) 25 MG tablet Take 0.5 tablets (12.5 mg total) by mouth 2 (two) times daily.  . Multiple Vitamin (MULTIVITAMIN) capsule Take 1 capsule by mouth daily.  . Nutritional Supplements (FEEDING SUPPLEMENT, NEPRO CARB STEADY,) LIQD Take 237 mLs by mouth 3 (three) times daily with meals.  . polyethylene glycol (MIRALAX / GLYCOLAX) 17 g packet Take 17 g by mouth daily.  . potassium chloride (KLOR-CON) 10 MEQ tablet Take 10 mEq by mouth daily.  Marland Kitchen saccharomyces boulardii (FLORASTOR) 250 MG capsule Take 250 mg by mouth 2 (two) times daily.  . sodium bicarbonate 650 MG tablet Take 650 mg by mouth 2 (two) times daily.  Marland Kitchen triamcinolone ointment (KENALOG) 0.1 % Apply 1 application topically 2 (two) times daily. (apply to arms and legs)   No facility-administered encounter medications on file as of 08/27/2019.    PHYSICAL EXAM:   General: NAD, frail appearing, thin, pleasant male Cardiovascular: regular  rate and rhythm Pulmonary: clear ant fields Abdomen: soft, nontender, + bowel sounds Extremities: mild BLE edema, no joint deformities Neurological: generalized weakness; walk shuffling gait  Jerene Yeager Ihor Gully, NP

## 2019-08-27 NOTE — Progress Notes (Signed)
[name] ([id number]) Visit Report for 08/26/2019 Chief Complaint Document Details Patient Name: [name], [name] Date of Service: 08/26/2019 9:45 AM Medical Record Number: [id number] Patient Account Number: [id number] Date of Birth/Sex: [birth year] (86 y.o. M) Treating RN: Cornell Barman Primary Care Provider: [name] Other Clinician: Referring Provider: Nicolette Bang Treating Provider/Extender: Melburn Hake, Rianne Degraaf Weeks in Treatment: 0 Information Obtained from: Patient Chief Complaint Left heel and right gluteal ulcer Electronic Signature(s) Signed: 08/26/2019 10:18:21 AM By: Worthy Keeler PA-C Entered By: Worthy Keeler on 08/26/2019 10:18:20 [name] ([id number]) -------------------------------------------------------------------------------- HPI Details Patient Name: [name] Date of Service: 08/26/2019 9:45 AM Medical Record Number: [id number] Patient Account Number: [id number] Date of Birth/Sex: 02-28-1932 (86 y.o. M) Treating RN: Cornell Barman Primary Care Provider: [name] Other Clinician: Referring Provider: Nicolette Bang Treating Provider/Extender: Melburn Hake, Mahathi Pokorney Weeks in Treatment: 0 History of Present Illness HPI Description: 05/19/18 on evaluation today patient presents for initial evaluation and clinic concerning issues that he has been having with his right medial lower leg ulcer. The patient has actually been seen in the emergency department for cellulitis he was admitted at [facility] on 04/19/18 discharged 04/21/18. He was noted to have proof of vascular disease and did undergo an angiogram on 04/20/18 by Dr. Leotis Pain. 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 Subsequently Dr. dew following intervention noted that the patient had depending on the location 20-40% residual stenosis noted and significant improvement in the overall  vascular flow. Obviously this is great news. With that being said the patient's wound he states does seem to be doing some better compared to previous. Upon inspection indeed it appears that he has some epithelialization. Overall I do not see any evidence of significant infection which is great news. No fevers chills noted 05/26/18 on evaluation today patient's wound actually appears to be doing much better at this time. With that being said I do think the [facility] Dressing a be sticking according to my nurse and this may be causing some issues as well. I'm gonna suggest at this point that we likely add mepitel underneath the [facility] Dressing help prevent this. Other than that everything seems to be doing excellent. 06/02/18 on evaluation today patient actually appears to be doing decently well in regard to his right lower extremity ulcer. This seems to show signs of good improvement which is excellent news. With that being said he did see vascular the good news is no further intervention is recommended nor necessary at this point. Actually placed in an [employer] and sent orders to home health which in the end it led to her switching the patient from the three layer compression wrap of the right lower extremity to an [employer] wrap. I feel like he did better in the three layer compression. With that being said the patient currently shows no signs of worsening the mepitel did help prevent any sticking to the wound bed which is great news. 06/12/18 an evaluation today patient presents for follow-up concerning his right lower extremity ulceration. The medial aspect ulcer appears to be doing fairly well this time which is good news. He has a blister on the lateral aspect although I'm concerned this may actually be due to the fact that his wrap is sliding down. We seem to be having issues with getting this wrapped appropriately were it will stay  in place. Fortunately there does not appear to  be any signs of infection at this time which is good news. He is seen with his son present at this point during the visit. 06/16/18 on evaluation today patient appears to be doing rather well and in fact appears to be almost completely healed in regard to his lower extremity ulcer on the right. The wrap much better this week with [place] wrapping it and just keeping him in that same wrap until follow-up. For that reason we will discontinue home health services. Fortunately there does not appear to be the evidence of infection at this point. 06/23/18 on evaluation today patient appears to be doing very well in regard to his ulcer which in fact appears to be completely healed. He has been tolerating the wraps without complication although he is definitely ready to be out of these considering how well things have done. Fortunately there is not appear to be any signs of infection at this time which is great news. 08/26/2019 upon evaluation today patient appears to be doing somewhat poorly upon initial inspection here in our clinic for his current issue. This is a patient that I previously taken care of with regard to wounds on his legs. With that being said the issues that were having right now are actually separate from that he has a heel ulcer unfortunately and then subsequently also does have a wound in the right gluteal region that appears to be more of a skin tear. The patient does have a history of diabetes mellitus type 2, peripheral vascular disease, hypertension, and chronic kidney disease stage IV. He is also somewhat weak though he does like to walk around which is actually a good thing he does so slowly. He is currently at an assisted living facility at this point. Electronic Signature(s) Signed: 08/26/2019 12:58:52 PM By: Worthy Keeler PA-C Entered By: Worthy Keeler on 08/26/2019 12:58:52 [name]  ([id number]) -------------------------------------------------------------------------------- Physical Exam Details Patient Name: [name] Date of Service: 08/26/2019 9:45 AM Medical Record Number: [id number] Patient Account Number: [id number] Date of Birth/Sex: 1932/05/15 (87 y.o. M) Treating RN: Cornell Barman Primary Care Provider: [name] Other Clinician: Referring Provider: Nicolette Bang Treating Provider/Extender: Melburn Hake, Ota Ebersole Weeks in Treatment: 0 Constitutional sitting or standing blood pressure is within target range for patient.. pulse regular and within target range for patient.Marland Kitchen respirations regular, non-labored and within target range for patient.Marland Kitchen temperature within target range for patient.. Well-nourished and well-hydrated in no acute distress. Eyes conjunctiva clear no eyelid edema noted. pupils equal round and reactive to light and accommodation. Ears, Nose, Mouth, and Throat no gross abnormality of ear auricles or external auditory canals. normal hearing noted during conversation. mucus membranes moist. Respiratory normal breathing without difficulty. Cardiovascular Absent posterior tibial and dorsalis pedis pulses bilateral lower extremities. no clubbing, cyanosis, significant edema, <3 sec cap refill. Musculoskeletal Patient unable to walk without assistance of a device. no significant deformity or arthritic changes, no loss or range of motion, no clubbing. Psychiatric this patient is able to make decisions and demonstrates good insight into disease process. Alert and Oriented x 3. pleasant and cooperative. Notes Upon inspection patient's wound bed actually did show signs of having significant necrotic tissue on his heel ulcer unfortunately. I do believe that he may benefit from Iodoflex to try to help clean this away. That said in order that we can send back for him as well and I do believe he has home health coming out.  Subsequently I also think that  sharp debridement would be appropriate but right now I am unsure of his arterial status we were not able to obtain sufficient ABIs for me to feel comfortable with debridement therefore we will send him to vascular for an arterial study with ABI and TBI. Electronic Signature(s) Signed: 08/26/2019 1:00:55 PM By: Worthy Keeler PA-C Entered By: Worthy Keeler on 08/26/2019 13:00:55 [name], [name] ([id number]) -------------------------------------------------------------------------------- Physician Orders Details Patient Name: [name] Date of Service: 08/26/2019 9:45 AM Medical Record Number: [id number] Patient Account Number: [id number] Date of Birth/Sex: 1931-09-22 (87 y.o. M) Treating RN: Cornell Barman Primary Care Provider: [name] Other Clinician: Referring Provider: Nicolette Bang Treating Provider/Extender: Melburn Hake, Kidus Delman Weeks in Treatment: 0 Verbal / Phone Orders: No Diagnosis Coding ICD-10 Coding Code Description E11.621 Type 2 diabetes mellitus with foot ulcer L89.620 Pressure ulcer of left heel, unstageable S31.819A Unspecified open wound of right buttock, initial encounter I73.89 Other specified peripheral vascular diseases I10 Essential (primary) hypertension N18.4 Chronic kidney disease, stage 4 (severe) Wound Cleansing Wound #2 Left Calcaneus o May Shower, gently pat wound dry prior to applying new dressing. Wound #3 Right Gluteus o May Shower, gently pat wound dry prior to applying new dressing. Anesthetic (add to Medication List) Wound #2 Left Calcaneus o Topical Lidocaine 4% cream applied to wound bed prior to debridement (In Clinic Only). Wound #3 Right Gluteus o Topical Lidocaine 4% cream applied to wound bed prior to debridement (In Clinic Only). Primary Wound Dressing Wound #2 Left Calcaneus o Iodoflex Wound #3 Right Gluteus o Collagen Secondary Dressing Wound #2 Left Calcaneus o Boardered Foam Dressing Wound #3 Right  Gluteus o Boardered Foam Dressing Dressing Change Frequency Wound #2 Left Calcaneus o Change Dressing Monday, Wednesday, Friday Wound #3 Right Gluteus o Change Dressing Monday, Wednesday, Friday Follow-up Appointments Wound #2 Left Calcaneus o Return Appointment in 1 week. Wound #3 Right Gluteus o Return Appointment in 1 week. [name], [name] ([id number]) Edema Control Wound #2 Left Calcaneus o Elevate legs to the level of the heart and pump ankles as often as possible Off-Loading Wound #3 Right Gluteus o Turn and reposition every 2 hours Home Health Wound #2 Left [place] [facility] o Home Health Nurse may visit PRN to address patientos wound care needs. o FACE TO FACE ENCOUNTER: MEDICARE and MEDICAID PATIENTS: I certify that this patient is under my care and that I had a face-to- face encounter that meets the physician face-to-face encounter requirements with this patient on this date. The encounter with the patient was in whole or in part for the following MEDICAL CONDITION: (primary reason for [place]) MEDICAL NECESSITY: I certify, that based on my findings, NURSING services are a medically necessary home health service. HOME BOUND STATUS: I certify that my clinical findings support that this patient is homebound (i.e., Due to illness or injury, pt requires aid of supportive devices such as crutches, cane, wheelchairs, walkers, the use of special transportation or the assistance of another person to leave their place of residence. There is a normal inability to leave the home and doing so requires considerable and taxing effort. Other absences are for medical reasons / religious services and are infrequent or of short duration when for other reasons). o If current dressing causes regression in wound condition, may D/C ordered dressing product/s and apply Normal Saline Moist Dressing daily until next [place] /  Other MD appointment. [place]  of regression in wound condition at [phone]. o Please direct any NON-WOUND related issues/requests for orders to patient's Primary Care Physician Wound #3 Right [place] Nurse may visit PRN to address patientos wound care needs. o FACE TO FACE ENCOUNTER: MEDICARE and MEDICAID PATIENTS: I certify that this patient is under my care and that I had a face-to- face encounter that meets the physician face-to-face encounter requirements with this patient on this date. The encounter with the patient was in whole or in part for the following MEDICAL CONDITION: (primary reason for [place]) MEDICAL NECESSITY: I certify, that based on my findings, NURSING services are a medically necessary home health service. HOME BOUND STATUS: I certify that my clinical findings support that this patient is homebound (i.e., Due to illness or injury, pt requires aid of supportive devices such as crutches, cane, wheelchairs, walkers, the use of special transportation or the assistance of another person to leave their place of residence. There is a normal inability to leave the home and doing so requires considerable and taxing effort. Other absences are for medical reasons / religious services and are infrequent or of short duration when for other reasons). o If current dressing causes regression in wound condition, may D/C ordered dressing product/s and apply Normal Saline Moist Dressing daily until next [place] / Other MD appointment. [place] of regression in wound condition at [phone]. o Please direct any NON-WOUND related issues/requests for orders to patient's Primary Care Physician Services and Therapies o Arterial Studies- Bilateral - ABI/TBI Electronic Signature(s) Signed: 08/26/2019 5:16:18 PM By: Worthy Keeler PA-C Signed: 08/27/2019 5:21:44 PM  By: Gretta Cool, BSN, RN, CWS, Kim RN, BSN Entered By: Gretta Cool, BSN, RN, CWS, Kim on 08/26/2019 10:39:28 [name], [name] ([id number]) -------------------------------------------------------------------------------- Problem List Details Patient Name: [name] Date of Service: 08/26/2019 9:45 AM Medical Record Number: [id number] Patient Account Number: [id number] Date of Birth/Sex: 1931-06-21 (87 y.o. M) Treating RN: Cornell Barman Primary Care Provider: [name] Other Clinician: Referring Provider: Nicolette Bang Treating Provider/Extender: Melburn Hake, [place] Takacs Weeks in Treatment: 0 Active Problems ICD-10 Evaluated Encounter Code Description Active Date Today Diagnosis E11.621 Type 2 diabetes mellitus with foot ulcer 08/26/2019 No Yes L89.620 Pressure ulcer of left heel, unstageable 08/26/2019 No Yes S31.819A Unspecified open wound of right buttock, initial encounter 08/26/2019 No Yes I73.89 Other specified peripheral vascular diseases 08/26/2019 No Yes I10 Essential (primary) hypertension 08/26/2019 No Yes N18.4 Chronic kidney disease, stage 4 (severe) 08/26/2019 No Yes Inactive Problems Resolved Problems Electronic Signature(s) Signed: 08/26/2019 10:18:00 AM By: Worthy Keeler PA-C Entered By: Worthy Keeler on 08/26/2019 10:18:00 Mccubbin, [name] ([id number]) -------------------------------------------------------------------------------- Progress Note Details Patient Name: [name] Date of Service: 08/26/2019 9:45 AM Medical Record Number: [id number] Patient Account Number: [id number] Date of Birth/Sex: Jun 06, 1932 (87 y.o. M) Treating RN: Cornell Barman Primary Care Provider: [name] Other Clinician: Referring Provider: Nicolette Bang Treating Provider/Extender: Melburn Hake, Anah Billard Weeks in Treatment: 0 Subjective Chief Complaint Information obtained from Patient Left heel and right gluteal ulcer History of Present Illness (HPI) 05/19/18 on evaluation today patient presents for  initial evaluation and clinic concerning issues that he has been having with his right medial lower leg ulcer. The patient has actually been seen in the emergency department for cellulitis he was admitted at [facility] on 04/19/18 discharged 04/21/18. He was noted to have proof of vascular disease and did undergo an angiogram on 04/20/18 by Dr.  Leotis Pain. 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 Subsequently Dr. dew following intervention noted that the patient had depending on the location 20-40% residual stenosis noted and significant improvement in the overall vascular flow. Obviously this is great news. With that being said the patient's wound he states does seem to be doing some better compared to previous. Upon inspection indeed it appears that he has some epithelialization. Overall I do not see any evidence of significant infection which is great news. No fevers chills noted 05/26/18 on evaluation today patient's wound actually appears to be doing much better at this time. With that being said I do think the [facility] Dressing a be sticking according to my nurse and this may be causing some issues as well. I'm gonna suggest at this point that we likely add mepitel underneath the [facility] Dressing help prevent this. Other than that everything seems to be doing excellent. 06/02/18 on evaluation today patient actually appears to be doing decently well in regard to his right lower extremity ulcer. This seems to show signs of good improvement which is excellent news. With that being said he did see vascular the good news is no further intervention is recommended nor necessary at this point. Actually placed in an [employer] and sent orders to home health which in the end it led to her switching the patient from the three layer compression wrap of the right lower extremity to an  [employer] wrap. I feel like he did better in the three layer compression. With that being said the patient currently shows no signs of worsening the mepitel did help prevent any sticking to the wound bed which is great news. 06/12/18 an evaluation today patient presents for follow-up concerning his right lower extremity ulceration. The medial aspect ulcer appears to be doing fairly well this time which is good news. He has a blister on the lateral aspect although I'm concerned this may actually be due to the fact that his wrap is sliding down. We seem to be having issues with getting this wrapped appropriately were it will stay in place. Fortunately there does not appear to be any signs of infection at this time which is good news. He is seen with his son present at this point during the visit. 06/16/18 on evaluation today patient appears to be doing rather well and in fact appears to be almost completely healed in regard to his lower extremity ulcer on the right. The wrap much better this week with [place] wrapping it and just keeping him in that same wrap until follow-up. For that reason we will discontinue home health services. Fortunately there does not appear to be the evidence of infection at this point. 06/23/18 on evaluation today patient appears to be doing very well in regard to his ulcer which in fact appears to be completely healed. He has been tolerating the wraps without complication although he is definitely ready to be out of these considering how well things have done. Fortunately there is not appear to be any signs of infection at this time which is great news. 08/26/2019 upon evaluation today patient appears to be doing somewhat poorly upon initial inspection here in our clinic for his current issue. This is a patient that I previously taken care of with regard to wounds on his legs. With that being said the issues that were having  right now are actually separate from that he has a heel ulcer  unfortunately and then subsequently also does have a wound in the right gluteal region that appears to be more of a skin tear. The patient does have a history of diabetes mellitus type 2, peripheral vascular disease, hypertension, and chronic kidney disease stage IV. He is also somewhat weak though he does like to walk around which is actually a good thing he does so slowly. He is currently at an assisted living facility at this point. Patient History Information obtained from Patient. Allergies penicillin (Severity: Mild, Reaction: hives), meperidine HCl Family History Cancer - Mother, Diabetes - Siblings, Hypertension - Siblings, Stroke - Siblings, No family history of Heart Disease, Hereditary Spherocytosis, Kidney Disease, Lung Disease, Seizures, Thyroid Problems, Tuberculosis. Social History Never smoker, Alcohol Use - Never, Drug Use - No History, Caffeine Use - Rarely. [name], [name] ([id number]) Medical History Eyes Patient has history of Cataracts Denies history of Glaucoma, Optic Neuritis Hematologic/Lymphatic Denies history of Anemia, Hemophilia, Human Immunodeficiency Virus, Lymphedema, Sickle Cell Disease Respiratory Denies history of Aspiration, Asthma, Chronic Obstructive Pulmonary Disease (COPD), Pneumothorax, Sleep Apnea, Tuberculosis Cardiovascular Patient has history of Hypertension - takes medication, Peripheral Venous Disease Gastrointestinal Denies history of Cirrhosis , Colitis, Crohn s, Hepatitis A, Hepatitis B, Hepatitis C Endocrine Patient has history of Type II Diabetes - 15 years Genitourinary Patient has history of End Stage Renal Disease - Stage 4, no dialysis, loses protein Immunological Denies history of Lupus Erythematosus, Raynaud s, Scleroderma Integumentary (Skin) Denies history of History of Burn, History of pressure wounds Musculoskeletal Denies history of Gout, Rheumatoid Arthritis, Osteoarthritis, Osteomyelitis Neurologic Denies history of  Dementia, Neuropathy, Quadriplegia, Paraplegia, Seizure Disorder Oncologic Denies history of Received Chemotherapy, Received Radiation Psychiatric Denies history of Anorexia/bulimia, Confinement Anxiety Medical And Surgical History Notes Eyes Detached Retina 1980 Review of Systems (ROS) Integumentary (Skin) Complains or has symptoms of Wounds. Objective Constitutional sitting or standing blood pressure is within target range for patient.. pulse regular and within target range for patient.Marland Kitchen respirations regular, non-labored and within target range for patient.Marland Kitchen temperature within target range for patient.. Well-nourished and well-hydrated in no acute distress. Vitals Time Taken: 9:35 AM, Height: 68 in, Source: Stated, Weight: 145 lbs, Source: Stated, BMI: 22, Temperature: 97.7 F, Pulse: 84 bpm, Respiratory Rate: 16 breaths/min, Blood Pressure: 131/60 mmHg. Eyes conjunctiva clear no eyelid edema noted. pupils equal round and reactive to light and accommodation. Ears, Nose, Mouth, and Throat no gross abnormality of ear auricles or external auditory canals. normal hearing noted during conversation. mucus membranes moist. Respiratory normal breathing without difficulty. Cardiovascular Absent posterior tibial and dorsalis pedis pulses bilateral lower extremities. no clubbing, cyanosis, significant edema, Musculoskeletal Patient unable to walk without assistance of a device. no significant deformity or arthritic changes, no loss or range of motion, no clubbing. Psychiatric KIANO, TERRIEN ([id number]) this patient is able to make decisions and demonstrates good insight into disease process. Alert and Oriented x 3. pleasant and cooperative. General Notes: Upon inspection patient's wound bed actually did show signs of having significant necrotic tissue on his heel ulcer unfortunately. I do believe that he may benefit from Iodoflex to try to help clean this away. That said in order that we can  send back for him as well and I do believe he has home health coming out. Subsequently I also think that sharp debridement would be appropriate but right now I am unsure of his arterial status we were not able to obtain  sufficient ABIs for me to feel comfortable with debridement therefore we will send him to vascular for an arterial study with ABI and TBI. Integumentary (Hair, Skin) Wound #2 status is Open. Original cause of wound was Gradually Appeared. The wound is located on the Left Calcaneus. The wound measures 1.3cm length x 1.4cm width x 0.1cm depth; 1.429cm^2 area and 0.143cm^3 volume. There is Fat Layer (Subcutaneous Tissue) Exposed exposed. There is no tunneling or undermining noted. There is a medium amount of serous drainage noted. The wound margin is flat and intact. There is no granulation within the wound bed. There is a large (67-100%) amount of necrotic tissue within the wound bed including Eschar and Adherent Slough. Wound #3 status is Open. Original cause of wound was Shear/Friction. The wound is located on the Right Gluteus. The wound measures 1.5cm length x 1.7cm width x 0.1cm depth; 2.003cm^2 area and 0.2cm^3 volume. The wound is limited to skin breakdown. There is no tunneling or undermining noted. There is a medium amount of serous drainage noted. The wound margin is flat and intact. There is large (67-100%) pink granulation within the wound bed. There is no necrotic tissue within the wound bed. Assessment Active Problems ICD-10 Type 2 diabetes mellitus with foot ulcer Pressure ulcer of left heel, unstageable Unspecified open wound of right buttock, initial encounter Other specified peripheral vascular diseases Essential (primary) hypertension Chronic kidney disease, stage 4 (severe) Plan Wound Cleansing: Wound #2 Left Calcaneus: May Shower, gently pat wound dry prior to applying new dressing. Wound #3 Right Gluteus: May Shower, gently pat wound dry prior to  applying new dressing. Anesthetic (add to Medication List): Wound #2 Left Calcaneus: Topical Lidocaine 4% cream applied to wound bed prior to debridement (In Clinic Only). Wound #3 Right Gluteus: Topical Lidocaine 4% cream applied to wound bed prior to debridement (In Clinic Only). Primary Wound Dressing: Wound #2 Left Calcaneus: Iodoflex Wound #3 Right Gluteus: Collagen Secondary Dressing: Wound #2 Left Calcaneus: Boardered Foam Dressing Wound #3 Right Gluteus: Boardered Foam Dressing Dressing Change Frequency: Wound #2 Left Calcaneus: Change Dressing Monday, Wednesday, Friday Wound #3 Right Gluteus: Change Dressing Monday, Wednesday, Friday Follow-up Appointments: Wound #2 Left Calcaneus: Return Appointment in 1 week. [name], [name] ([id number]) Wound #3 Right Gluteus: Return Appointment in 1 week. Edema Control: Wound #2 Left Calcaneus: Elevate legs to the level of the heart and pump ankles as often as possible Off-Loading: Wound #3 Right Gluteus: Turn and reposition every 2 hours Home Health: Wound #2 Left Calcaneus: Continue Home Health Visits - [facility] Nurse may visit PRN to address patient s wound care needs. FACE TO FACE ENCOUNTER: MEDICARE and MEDICAID PATIENTS: I certify that this patient is under my care and that I had a face-to-face encounter that meets the physician face-to-face encounter requirements with this patient on this date. The encounter with the patient was in whole or in part for the following MEDICAL CONDITION: (primary reason for [place]) MEDICAL NECESSITY: I certify, that based on my findings, NURSING services are a medically necessary home health service. HOME BOUND STATUS: I certify that my clinical findings support that this patient is homebound (i.e., Due to illness or injury, pt requires aid of supportive devices such as crutches, cane, wheelchairs, walkers, the use of special transportation or the assistance of another  person to leave their place of residence. There is a normal inability to leave the home and doing so requires considerable and taxing effort. Other absences are for medical reasons /  religious services and are infrequent or of short duration when for other reasons). If current dressing causes regression in wound condition, may D/C ordered dressing product/s and apply Normal Saline Moist Dressing daily until next [place] / Other MD appointment. [place] of regression in wound condition at [phone]. Please direct any NON-WOUND related issues/requests for orders to patient's Primary Care Physician Wound #3 Right Gluteus: [place] Nurse may visit PRN to address patient s wound care needs. FACE TO FACE ENCOUNTER: MEDICARE and MEDICAID PATIENTS: I certify that this patient is under my care and that I had a face-to-face encounter that meets the physician face-to-face encounter requirements with this patient on this date. The encounter with the patient was in whole or in part for the following MEDICAL CONDITION: (primary reason for [place]) MEDICAL NECESSITY: I certify, that based on my findings, NURSING services are a medically necessary home health service. HOME BOUND STATUS: I certify that my clinical findings support that this patient is homebound (i.e., Due to illness or injury, pt requires aid of supportive devices such as crutches, cane, wheelchairs, walkers, the use of special transportation or the assistance of another person to leave their place of residence. There is a normal inability to leave the home and doing so requires considerable and taxing effort. Other absences are for medical reasons / religious services and are infrequent or of short duration when for other reasons). If current dressing causes regression in wound condition, may D/C ordered dressing product/s and apply Normal Saline Moist Dressing  daily until next [place] / Other MD appointment. [place] of regression in wound condition at [phone]. Please direct any NON-WOUND related issues/requests for orders to patient's Primary Care Physician Services and Therapies ordered were: Arterial Studies- Bilateral - ABI/TBI 1. I would recommend currently that we initiate Iodoflex for the heel ulcer as I believe this will help with allowing this area to clean up until we can perform sharp debridement to clear this away. 2. I am also can recommend that we go ahead and initiate collagen for the gluteal region I think this will likely heal very quickly it appears to be more of a skin tear/friction injury or abrasion at this point. Overall I think that if he keeps pressure off of them protect that this will do quite well. 3. I am also going to recommend that we go ahead and order the arterial study with ABI and TBI to evaluate the blood flow in the left lower extremity. Obviously I am somewhat concerned about this due to the lack of ability to get the ABI today and then subsequently the fact that the patient again does have this pressure injury that does not appear to be doing too well the heel. 3. I do not see any signs of infection we will continue to monitor as such but if we need to initiate antibiotics will do so right now I did not see the need for that. We will see patient back for reevaluation in 1 week here in the clinic. If anything worsens or changes patient will contact our office for additional recommendations. Electronic Signature(s) Signed: 08/26/2019 1:02:05 PM By: Worthy Keeler PA-C Entered By: Worthy Keeler on 08/26/2019 13:02:05 [name], [name] ([id number]) -------------------------------------------------------------------------------- ROS/PFSH Details Patient Name: [name] Date of Service: 08/26/2019 9:45 AM Medical Record Number: [id number] Patient Account Number: [id number] Date  of Birth/Sex: Apr 10, 1932 (87 y.o. M) Treating  RN: Montey Hora Primary Care Provider: [name] Other Clinician: Referring Provider: Nicolette Bang Treating Provider/Extender: Melburn Hake, Elsey Holts Weeks in Treatment: 0 Information Obtained From Patient Integumentary (Skin) Complaints and Symptoms: Positive for: Wounds Medical History: Negative for: History of Burn; History of pressure wounds Eyes Medical History: Positive for: Cataracts Negative for: Glaucoma; Optic Neuritis Past Medical History Notes: Detached Retina 1980 Hematologic/Lymphatic Medical History: Negative for: Anemia; Hemophilia; Human Immunodeficiency Virus; Lymphedema; Sickle Cell Disease Respiratory Medical History: Negative for: Aspiration; Asthma; Chronic Obstructive Pulmonary Disease (COPD); Pneumothorax; Sleep Apnea; Tuberculosis Cardiovascular Medical History: Positive for: Hypertension - takes medication; Peripheral Venous Disease Gastrointestinal Medical History: Negative for: Cirrhosis ; Colitis; Crohnos; Hepatitis A; Hepatitis B; Hepatitis C Endocrine Medical History: Positive for: Type II Diabetes - 15 years Time with diabetes: 15 years Treated with: Oral agents Blood sugar tested every day: Yes Tested : Genitourinary Medical History: Positive for: End Stage Renal Disease - Stage 4, no dialysis, loses protein Immunological Medical History: Negative for: Lupus Erythematosus; Raynaudos; Scleroderma Musculoskeletal [name], [name]. ([id number]) Medical History: Negative for: Gout; Rheumatoid Arthritis; Osteoarthritis; Osteomyelitis Neurologic Medical History: Negative for: Dementia; Neuropathy; Quadriplegia; Paraplegia; Seizure Disorder Oncologic Medical History: Negative for: Received Chemotherapy; Received Radiation Psychiatric Medical History: Negative for: Anorexia/bulimia; Confinement Anxiety HBO Extended History Items Eyes: Cataracts Immunizations Pneumococcal  Vaccine: Received Pneumococcal Vaccination: Yes Implantable Devices None Family and Social History Cancer: Yes - Mother; Diabetes: Yes - Siblings; Heart Disease: No; Hereditary Spherocytosis: No; Hypertension: Yes - Siblings; Kidney Disease: No; Lung Disease: No; Seizures: No; Stroke: Yes - Siblings; Thyroid Problems: No; Tuberculosis: No; Never smoker; Alcohol Use: Never; Drug Use: No History; Caffeine Use: Rarely; Financial Concerns: No; Food, Clothing or Shelter Needs: No; Support System Lacking: No; Transportation Concerns: No Electronic Signature(s) Signed: 08/26/2019 4:33:07 PM By: Montey Hora Signed: 08/26/2019 5:16:18 PM By: Worthy Keeler PA-C Entered By: Montey Hora on 08/26/2019 09:51:27 [name], [name] ([id number]) -------------------------------------------------------------------------------- SuperBill Details Patient Name: [name] Date of Service: 08/26/2019 Medical Record Number: [id number] Patient Account Number: [id number] Date of Birth/Sex: 01-05-1932 (86 y.o. M) Treating RN: Cornell Barman Primary Care Provider: [name] Other Clinician: Referring Provider: Nicolette Bang Treating Provider/Extender: Melburn Hake, Hadlee Burback Weeks in Treatment: 0 Diagnosis Coding ICD-10 Codes Code Description E11.621 Type 2 diabetes mellitus with foot ulcer L89.620 Pressure ulcer of left heel, unstageable S31.819A Unspecified open wound of right buttock, initial encounter I73.89 Other specified peripheral vascular diseases I10 Essential (primary) hypertension N18.4 Chronic kidney disease, stage 4 (severe) Facility Procedures CPT4 Code: 94765465 Description: 03546 - WOUND CARE VISIT-LEV 5 EST PT Modifier: Quantity: 1 Physician Procedures CPT4 Code: 5681275 Description: 17001 - WC PHYS LEVEL 4 - EST PT Modifier: Quantity: 1 CPT4 Code: Description: ICD-10 Diagnosis Description E11.621 Type 2 diabetes mellitus with foot ulcer L89.620 Pressure ulcer of left heel,  unstageable S31.819A Unspecified open wound of right buttock, initial encounter I73.89 Other specified peripheral vascular  diseases Modifier: Quantity: Electronic Signature(s) Signed: 08/26/2019 5:09:20 PM By: Gretta Cool, BSN, RN, CWS, Kim RN, BSN Signed: 08/26/2019 5:16:18 PM By: Worthy Keeler PA-C Previous Signature: 08/26/2019 1:05:26 PM Version By: Worthy Keeler PA-C Entered By: Gretta Cool, BSN, RN, CWS, Kim on 08/26/2019 17:09:20

## 2019-09-02 ENCOUNTER — Encounter: Payer: Medicare Other | Admitting: Physician Assistant

## 2019-09-02 ENCOUNTER — Ambulatory Visit (INDEPENDENT_AMBULATORY_CARE_PROVIDER_SITE_OTHER): Payer: Medicare Other

## 2019-09-02 ENCOUNTER — Other Ambulatory Visit (INDEPENDENT_AMBULATORY_CARE_PROVIDER_SITE_OTHER): Payer: Self-pay | Admitting: Physician Assistant

## 2019-09-02 ENCOUNTER — Other Ambulatory Visit: Payer: Self-pay

## 2019-09-02 DIAGNOSIS — S81809A Unspecified open wound, unspecified lower leg, initial encounter: Secondary | ICD-10-CM

## 2019-09-02 DIAGNOSIS — S81802A Unspecified open wound, left lower leg, initial encounter: Secondary | ICD-10-CM | POA: Diagnosis not present

## 2019-09-02 DIAGNOSIS — E11621 Type 2 diabetes mellitus with foot ulcer: Secondary | ICD-10-CM | POA: Diagnosis not present

## 2019-09-02 NOTE — Progress Notes (Signed)
BARON, PARMELEE ([id number]) Visit Report for 09/02/2019 Arrival Information Details Patient Name: [name], [name] Date of Service: 09/02/2019 3:15 PM Medical Record Number: [id number] Patient Account Number: [id number] Date of Birth/Sex: [birth year] (86 y.o. M) Treating RN: Army Melia Primary Care Veronia Laprise: [name] Other Clinician: Referring Saba Gomm: [name] Treating Macaela Presas/Extender: Melburn Hake, HOYT Weeks in Treatment: 1 Visit Information History Since Last Visit Added or deleted any medications: No Patient Arrived: Walker Any new allergies or adverse reactions: No Arrival Time: 15:14 Had a fall or experienced change in No Accompanied By: daughter activities of daily living that may affect Transfer Assistance: None risk of falls: Patient Identification Verified: Yes Signs or symptoms of abuse/neglect since last visito No Secondary Verification Process Completed: Yes Hospitalized since last visit: No Implantable device outside of the clinic excluding No cellular tissue based products placed in the center since last visit: Has Dressing in Place as Prescribed: Yes Pain Present Now: Yes Electronic Signature(s) Signed: 09/02/2019 4:15:53 PM By: Lorine Bears RCP, RRT, CHT Entered By: Lorine Bears on 09/02/2019 15:15:52 [name], [name] ([id number]) -------------------------------------------------------------------------------- Encounter Discharge Information Details Patient Name: [name] Date of Service: 09/02/2019 3:15 PM Medical Record Number: [id number] Patient Account Number: [id number] Date of Birth/Sex: [birth year] (86 y.o. M) Treating RN: Army Melia Primary Care Ireoluwa Gorsline: [name] Other Clinician: Referring Adanely Reynoso: [name] Treating Jemia Fata/Extender: Melburn Hake, HOYT Weeks in Treatment: 1 Encounter Discharge Information Items Post Procedure Vitals Discharge Condition: Stable Temperature (F): 98.6 Ambulatory  Status: Walker Pulse (bpm): 87 Discharge Destination: Home Respiratory Rate (breaths/min): 16 Transportation: Private Auto Blood Pressure (mmHg): 113/48 Accompanied By: daughter Schedule Follow-up Appointment: Yes Clinical Summary of Care: Electronic Signature(s) Signed: 09/02/2019 4:44:08 PM By: Army Melia Entered By: Army Melia on 09/02/2019 16:09:25 Penaflor, [name] ([id number]) -------------------------------------------------------------------------------- Lower Extremity Assessment Details Patient Name: [name] Date of Service: 09/02/2019 3:15 PM Medical Record Number: [id number] Patient Account Number: [id number] Date of Birth/Sex: May 19, 1932 (86 y.o. M) Treating RN: Montey Hora Primary Care Ceclia Koker: [name] Other Clinician: Referring Eryn Marandola: [name] Treating Onia Shiflett/Extender: Melburn Hake, HOYT Weeks in Treatment: 1 Vascular Assessment Blood Pressure: Brachial: [Left:138] [Right:143] Dorsalis Pedis: 226 [Left:Dorsalis Pedis: 204] Ankle: Posterior Tibial: 166 [Left:Posterior Tibial: 250 1.58] [Right:1.75] Notes ABI from [facility] 09/02/19 with TBI L 1.01 and R .64 Electronic Signature(s) Signed: 09/02/2019 4:59:02 PM By: Montey Hora Entered By: Montey Hora on 09/02/2019 15:26:48 [name], [name] ([id number]) -------------------------------------------------------------------------------- Multi Wound Chart Details Patient Name: [name] Date of Service: 09/02/2019 3:15 PM Medical Record Number: [id number] Patient Account Number: [id number] Date of Birth/Sex: [birth year] (86 y.o. M) Treating RN: Army Melia Primary Care Greyson Peavy: [name] Other Clinician: Referring Kati Riggenbach: [name] Treating Vang Kraeger/Extender: Melburn Hake, HOYT Weeks in Treatment: 1 Vital Signs Height(in): 75 Pulse(bpm): 73 Weight(lbs): 145 Blood Pressure(mmHg): 113/48 Body Mass Index(BMI): 22 Temperature(F): 98.6 Respiratory Rate(breaths/min): 16 Photos:  [N/A:N/A] Wound Location: Left Calcaneus Right Gluteus N/A Wounding Event: Gradually Appeared Shear/Friction N/A Primary Etiology: Pressure Ulcer To be determined N/A Comorbid History: Cataracts, Hypertension, Peripheral Cataracts, Hypertension, Peripheral N/A Venous Disease, Type II Diabetes, Venous Disease, Type II Diabetes, End Stage Renal Disease End Stage Renal Disease Date Acquired: 07/26/2019 08/23/2019 N/A Weeks of Treatment: 1 1 N/A Wound Status: Open Open N/A Measurements L x W x D (cm) 1x1.3x0.2 1x0.3x0.1 N/A Area (cm) : 1.021 0.236 N/A Volume (cm) : 0.204 0.024 N/A % Reduction in Area: 28.60% 88.20% N/A % Reduction in Volume: -42.70% 88.00% N/A Classification: Category/Stage III Full Thickness  Without Exposed N/A Support Structures Exudate Amount: Medium Medium N/A Exudate Type: Serous Serous N/A Exudate Color: amber amber N/A Wound Margin: Flat and Intact Flat and Intact N/A Granulation Amount: Medium (34-66%) Medium (34-66%) N/A Granulation Quality: Pink Pink N/A Necrotic Amount: Medium (34-66%) Medium (34-66%) N/A Exposed Structures: Fat Layer (Subcutaneous Tissue) Fat Layer (Subcutaneous Tissue) N/A Exposed: Yes Exposed: Yes Fascia: No Fascia: No Tendon: No Tendon: No Muscle: No Muscle: No Joint: No Joint: No Bone: No Bone: No Epithelialization: None Medium (34-66%) N/A Treatment Notes Electronic Signature(s) Signed: 09/02/2019 4:44:08 PM By: Harrie Jeans ([id number]) Entered By: Army Melia on 09/02/2019 16:03:08 [name], [name] ([id number]) -------------------------------------------------------------------------------- [place] Details Patient Name: [name] Date of Service: 09/02/2019 3:15 PM Medical Record Number: [id number] Patient Account Number: [id number] Date of Birth/Sex: 05-Apr-1932 (86 y.o. M) Treating RN: Army Melia Primary Care Ericka Marcellus: [name] Other Clinician: Referring Theus Espin: [name] Treating Neymar Dowe/Extender: Melburn Hake, HOYT Weeks in Treatment: 1 Active Inactive Abuse / Safety / Falls / Self Care Management Nursing Diagnoses: Impaired physical mobility Goals: Patient/caregiver will verbalize understanding of skin care regimen Date Initiated: 08/26/2019 Target Resolution Date: 09/08/2019 Goal Status: Active Interventions: Assess fall risk on admission and as needed Notes: Orientation to the Wound Care Program Nursing Diagnoses: Knowledge deficit related to the wound healing center program Goals: Patient/caregiver will verbalize understanding of the [place] Date Initiated: 08/26/2019 Target Resolution Date: 09/08/2019 Goal Status: Active Interventions: Provide education on orientation to the wound center Notes: Pressure Nursing Diagnoses: Knowledge deficit related to causes and risk factors for pressure ulcer development Goals: Patient will remain free from development of additional pressure ulcers Date Initiated: 08/26/2019 Target Resolution Date: 09/08/2019 Goal Status: Active Interventions: Assess: immobility, friction, shearing, incontinence upon admission and as needed Notes: Wound/Skin Impairment Nursing Diagnoses: Impaired tissue integrity Erhard, Darell R. ([id number]) Goals: Ulcer/skin breakdown will have a volume reduction of 30% by week 4 Date Initiated: 08/26/2019 Target Resolution Date: 09/26/2019 Goal Status: Active Interventions: Assess patient/caregiver ability to obtain necessary supplies Treatment Activities: Skin care regimen initiated : 08/26/2019 Notes: Electronic Signature(s) Signed: 09/02/2019 4:44:08 PM By: Army Melia Entered By: Army Melia on 09/02/2019 16:02:52 [name], [name] ([id number]) -------------------------------------------------------------------------------- Pain Assessment Details Patient Name: [name] Date of Service: 09/02/2019 3:15 PM Medical Record Number: [id number] Patient  Account Number: [id number] Date of Birth/Sex: 11-23-1931 (86 y.o. M) Treating RN: Montey Hora Primary Care Senaida Chilcote: [name] Other Clinician: Referring Tyjanae Bartek: [name] Treating Jonluke Cobbins/Extender: Melburn Hake, HOYT Weeks in Treatment: 1 Active Problems Location of Pain Severity and Description of Pain Patient Has Paino Yes Site Locations Pain Location: Pain in Ulcers With Dressing Change: Yes Pain Management and Medication Current Pain Management: Electronic Signature(s) Signed: 09/02/2019 4:59:02 PM By: Montey Hora Entered By: Montey Hora on 09/02/2019 15:23:32 [name], [name] ([id number]) -------------------------------------------------------------------------------- Patient/Caregiver Education Details Patient Name: [name] Date of Service: 09/02/2019 3:15 PM Medical Record Number: [id number] Patient Account Number: [id number] Date of Birth/Gender: 20-Jul-1931 (86 y.o. M) Treating RN: Army Melia Primary Care Physician: [name] Other Clinician: Referring Physician: [name] Treating Physician/Extender: Melburn Hake, HOYT Weeks in Treatment: 1 Education Assessment Education Provided To: Patient Education Topics Provided Wound/Skin Impairment: Handouts: Caring for Your Ulcer Methods: Demonstration, Explain/Verbal Responses: State content correctly Electronic Signature(s) Signed: 09/02/2019 4:44:08 PM By: Army Melia Entered By: Army Melia on 09/02/2019 16:08:36 Bowns, [place]. ([id number]) -------------------------------------------------------------------------------- Wound Assessment Details Patient Name: [name] Date of Service: 09/02/2019 3:15  PM Medical Record Number: [id number] Patient Account Number: [id number] Date of Birth/Sex: 06/18/1931 (86 y.o. M) Treating RN: Montey Hora Primary Care Keagon Glascoe: [name] Other Clinician: Referring Flannery Cavallero: [name] Treating Remberto Lienhard/Extender: Melburn Hake, HOYT Weeks in  Treatment: 1 Wound Status Wound Number: 2 Primary Pressure Ulcer Etiology: Wound Location: Left Calcaneus Wound Open Wounding Event: Gradually Appeared Status: Date Acquired: 07/26/2019 Comorbid Cataracts, Hypertension, Peripheral Venous Disease, Type Weeks Of Treatment: 1 History: II Diabetes, End Stage Renal Disease Clustered Wound: No Photos Wound Measurements Length: (cm) 1 % Re Width: (cm) 1.3 % Re Depth: (cm) 0.2 Epit Area: (cm) 1.021 Tun Volume: (cm) 0.204 Und duction in Area: 28.6% duction in Volume: -42.7% helialization: None neling: No ermining: No Wound Description Classification: Category/Stage III Fou Wound Margin: Flat and Intact Slo Exudate Amount: Medium Exudate Type: Serous Exudate Color: amber l Odor After Cleansing: No ugh/Fibrino Yes Wound Bed Granulation Amount: Medium (34-66%) Exposed Structure Granulation Quality: Pink Fascia Exposed: No Necrotic Amount: Medium (34-66%) Fat Layer (Subcutaneous Tissue) Exposed: Yes Necrotic Quality: Adherent Slough Tendon Exposed: No Muscle Exposed: No Joint Exposed: No Bone Exposed: No Treatment Notes Wound #2 (Left Calcaneus) Notes iodoflex to heel promogram to glut, BFD Electronic Signature(s) Signed: 09/02/2019 4:59:02 PM By: [name], [name] ([id number]) Entered By: Montey Hora on 09/02/2019 15:35:54 [name], [name] ([id number]) -------------------------------------------------------------------------------- Wound Assessment Details Patient Name: [name] Date of Service: 09/02/2019 3:15 PM Medical Record Number: [id number] Patient Account Number: [id number] Date of Birth/Sex: 1932/03/08 (86 y.o. M) Treating RN: Montey Hora Primary Care Omesha Bowerman: [name] Other Clinician: Referring Tyrik Stetzer: [name] Treating Quashon Jesus/Extender: Melburn Hake, HOYT Weeks in Treatment: 1 Wound Status Wound Number: 3 Primary To be determined Etiology: Wound Location: Right  Gluteus Wound Open Wounding Event: Shear/Friction Status: Date Acquired: 08/23/2019 Comorbid Cataracts, Hypertension, Peripheral Venous Disease, Type Weeks Of Treatment: 1 History: II Diabetes, End Stage Renal Disease Clustered Wound: No Photos Wound Measurements Length: (cm) 1 Width: (cm) 0.3 Depth: (cm) 0.1 Area: (cm) 0.236 Volume: (cm) 0.024 % Reduction in Area: 88.2% % Reduction in Volume: 88% Epithelialization: Medium (34-66%) Tunneling: No Undermining: No Wound Description Classification: Full Thickness Without Exposed Support Structu Wound Margin: Flat and Intact Exudate Amount: Medium Exudate Type: Serous Exudate Color: amber res Foul Odor After Cleansing: No Slough/Fibrino No Wound Bed Granulation Amount: Medium (34-66%) Exposed Structure Granulation Quality: Pink Fascia Exposed: No Necrotic Amount: Medium (34-66%) Fat Layer (Subcutaneous Tissue) Exposed: Yes Necrotic Quality: Adherent Slough Tendon Exposed: No Muscle Exposed: No Joint Exposed: No Bone Exposed: No Treatment Notes Wound #3 (Right Gluteus) Notes iodoflex to heel promogram to glut, BFD Electronic Signature(s) Signed: 09/02/2019 4:59:02 PM By: Noreene Larsson ([id number]) Entered By: Montey Hora on 09/02/2019 15:36:24 [name], [name] ([id number]) -------------------------------------------------------------------------------- Vitals Details Patient Name: [name] Date of Service: 09/02/2019 3:15 PM Medical Record Number: [id number] Patient Account Number: [id number] Date of Birth/Sex: Jul 21, 1931 (86 y.o. M) Treating RN: Army Melia Primary Care Rae Plotner: [name] Other Clinician: Referring Yaakov Saindon: [name] Treating Afifa Truax/Extender: Melburn Hake, HOYT Weeks in Treatment: 1 Vital Signs Time Taken: 15:15 Temperature (F): 98.6 Height (in): 68 Pulse (bpm): 87 Weight (lbs): 145 Respiratory Rate (breaths/min): 16 Body Mass Index (BMI): 22 Blood Pressure  (mmHg): 113/48 Reference Range: 80 - 120 mg / dl Electronic Signature(s) Signed: 09/02/2019 4:15:53 PM By: Lorine Bears RCP, RRT, CHT Entered By: Lorine Bears on 09/02/2019 15:21:40

## 2019-09-02 NOTE — Progress Notes (Addendum)
EFREM, PITSTICK ([id number]) Visit Report for 09/02/2019 Chief Complaint Document Details Patient Name: [name], [name] Date of Service: 09/02/2019 3:15 PM Medical Record Number: [id number] Patient Account Number: [id number] Date of Birth/Sex: [birth year] (86 y.o. M) Treating RN: Army Melia Primary Care Provider: [name] Other Clinician: Referring Provider: [name] Treating Provider/Extender: Melburn Hake, Khayree Delellis Weeks in Treatment: 1 Information Obtained from: Patient Chief Complaint Left heel and right gluteal ulcer Electronic Signature(s) Signed: 09/02/2019 3:41:56 PM By: Worthy Keeler PA-C Entered By: Worthy Keeler on 09/02/2019 15:41:55 [name], [name] ([id number]) -------------------------------------------------------------------------------- Debridement Details Patient Name: [name] Date of Service: 09/02/2019 3:15 PM Medical Record Number: [id number] Patient Account Number: [id number] Date of Birth/Sex: 01-Jul-1931 (87 y.o. M) Treating RN: Army Melia Primary Care Provider: [name] Other Clinician: Referring Provider: [name] Treating Provider/Extender: Melburn Hake, Anik Wesch Weeks in Treatment: 1 Debridement Performed for Wound #2 Left Calcaneus Assessment: Performed By: Physician STONE III, Mikailah Morel E., PA-C Debridement Type: Debridement Level of Consciousness (Pre- Awake and Alert procedure): Pre-procedure Verification/Time Out Yes - 16:04 Taken: Start Time: 16:02 Pain Control: Lidocaine Total Area Debrided (L x W): 1 (cm) x 1.3 (cm) = 1.3 (cm) Tissue and other material debrided: Callus, Slough, Subcutaneous, Slough Level: Skin/Subcutaneous Tissue Debridement Description: Excisional Instrument: Curette Bleeding: Minimum Hemostasis Achieved: Pressure End Time: 16:05 Response to Treatment: Procedure was tolerated well Level of Consciousness (Post- Awake and Alert procedure): Post Debridement Measurements of Total Wound Length: (cm) 1 Stage:  Category/Stage III Width: (cm) 1.3 Depth: (cm) 0.2 Volume: (cm) 0.204 Character of Wound/Ulcer Post Debridement: Stable Post Procedure Diagnosis Same as Pre-procedure Electronic Signature(s) Signed: 09/02/2019 4:44:08 PM By: Army Melia Signed: 09/02/2019 4:59:35 PM By: Worthy Keeler PA-C Entered By: Army Melia on 09/02/2019 16:05:06 [name], [name] R. ([id number]) -------------------------------------------------------------------------------- HPI Details Patient Name: [name] Date of Service: 09/02/2019 3:15 PM Medical Record Number: [id number] Patient Account Number: [id number] Date of Birth/Sex: [birth year] (86 y.o. M) Treating RN: Army Melia Primary Care Provider: [name] Other Clinician: Referring Provider: [name] Treating Provider/Extender: Melburn Hake, Kenan Moodie Weeks in Treatment: 1 History of Present Illness HPI Description: 05/19/18 on evaluation today patient presents for initial evaluation and clinic concerning issues that he has been having with his right medial lower leg ulcer. The patient has actually been seen in the emergency department for cellulitis he was admitted at [facility] on 04/19/18 discharged 04/21/18. He was noted to have proof of vascular disease and did undergo an angiogram on 04/20/18 by Dr. Leotis Pain. 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 Subsequently Dr. dew following intervention noted that the patient had depending on the location 20-40% residual stenosis noted and significant improvement in the overall vascular flow. Obviously this is great news. With that being said the patient's wound he states does seem to be doing some better compared to previous. Upon inspection indeed it appears that he has some epithelialization. Overall I do not see any evidence of significant infection which is great news. No fevers chills  noted 05/26/18 on evaluation today patient's wound actually appears to be doing much better at this time. With that being said I do think the [facility] Dressing a be sticking according to my nurse and this may be causing some issues as well. I'm gonna suggest at this point that we likely add mepitel underneath the [facility] Dressing help prevent this.  Other than that everything seems to be doing excellent. 06/02/18 on evaluation today patient actually appears to be doing decently well in regard to his right lower extremity ulcer. This seems to show signs of good improvement which is excellent news. With that being said he did see vascular the good news is no further intervention is recommended nor necessary at this point. Actually placed in an [employer] and sent orders to home health which in the end it led to her switching the patient from the three layer compression wrap of the right lower extremity to an [employer] wrap. I feel like he did better in the three layer compression. With that being said the patient currently shows no signs of worsening the mepitel did help prevent any sticking to the wound bed which is great news. 06/12/18 an evaluation today patient presents for follow-up concerning his right lower extremity ulceration. The medial aspect ulcer appears to be doing fairly well this time which is good news. He has a blister on the lateral aspect although I'm concerned this may actually be due to the fact that his wrap is sliding down. We seem to be having issues with getting this wrapped appropriately were it will stay in place. Fortunately there does not appear to be any signs of infection at this time which is good news. He is seen with his son present at this point during the visit. 06/16/18 on evaluation today patient appears to be doing rather well and in fact appears to be almost completely healed in regard to his lower extremity ulcer on the right. The wrap much better this  week with [place] wrapping it and just keeping him in that same wrap until follow-up. For that reason we will discontinue home health services. Fortunately there does not appear to be the evidence of infection at this point. 06/23/18 on evaluation today patient appears to be doing very well in regard to his ulcer which in fact appears to be completely healed. He has been tolerating the wraps without complication although he is definitely ready to be out of these considering how well things have done. Fortunately there is not appear to be any signs of infection at this time which is great news. 08/26/2019 upon evaluation today patient appears to be doing somewhat poorly upon initial inspection here in our clinic for his current issue. This is a patient that I previously taken care of with regard to wounds on his legs. With that being said the issues that were having right now are actually separate from that he has a heel ulcer unfortunately and then subsequently also does have a wound in the right gluteal region that appears to be more of a skin tear. The patient does have a history of diabetes mellitus type 2, peripheral vascular disease, hypertension, and chronic kidney disease stage IV. He is also somewhat weak though he does like to walk around which is actually a good thing he does so slowly. He is currently at an assisted living facility at this point. 09/02/2019 upon evaluation today patient appears to be doing well with regard to the wound on his left heel as well as the right gluteal region. He has had the arterial studies I did look up the numbers and it appears that his TBI's were actually pretty good bilaterally measuring about 1 on the left and around 0.64 on the right. With that being said the final report is not here yet we should have that by next week. Nonetheless  looking at the results of the imaging I feel like he has good arterial flow to be able to proceed with debridement today especially  on this left heel. Electronic Signature(s) Signed: 09/02/2019 4:22:54 PM By: Worthy Keeler PA-C Entered By: Worthy Keeler on 09/02/2019 16:22:54 [name], [name] ([id number]) -------------------------------------------------------------------------------- Physical Exam Details Patient Name: [name] Date of Service: 09/02/2019 3:15 PM Medical Record Number: [id number] Patient Account Number: [id number] Date of Birth/Sex: 1931/07/08 (87 y.o. M) Treating RN: Army Melia Primary Care Provider: [name] Other Clinician: Referring Provider: [name] Treating Provider/Extender: Melburn Hake, Jibril Mcminn Weeks in Treatment: 1 Constitutional Well-nourished and well-hydrated in no acute distress. Respiratory normal breathing without difficulty. Psychiatric this patient is able to make decisions and demonstrates good insight into disease process. Alert and Oriented x 3. pleasant and cooperative. Notes Upon inspection patient seems to be doing quite well with regard to the wound in the gluteal region there was no need for debridement and this seems to be very close to complete resolution. With regard to the heel there was necrotic tissue noted in the base of the wound I did actually perform sharp debridement to clear away some of the necrotic debris today and the patient tolerated that without complication. Post debridement wound bed appears to be doing significantly better. Electronic Signature(s) Signed: 09/02/2019 4:23:34 PM By: Worthy Keeler PA-C Entered By: Worthy Keeler on 09/02/2019 16:23:33 [name], [name] ([id number]) -------------------------------------------------------------------------------- Physician Orders Details Patient Name: [name] Date of Service: 09/02/2019 3:15 PM Medical Record Number: [id number] Patient Account Number: [id number] Date of Birth/Sex: [birth year] (87 y.o. M) Treating RN: Army Melia Primary Care Provider: [name] Other  Clinician: Referring Provider: [name] Treating Provider/Extender: Melburn Hake, Lilliah Priego Weeks in Treatment: 1 Verbal / Phone Orders: No Diagnosis Coding ICD-10 Coding Code Description E11.621 Type 2 diabetes mellitus with foot ulcer L89.620 Pressure ulcer of left heel, unstageable S31.819A Unspecified open wound of right buttock, initial encounter I73.89 Other specified peripheral vascular diseases I10 Essential (primary) hypertension N18.4 Chronic kidney disease, stage 4 (severe) Wound Cleansing Wound #2 Left Calcaneus o May Shower, gently pat wound dry prior to applying new dressing. Wound #3 Right Gluteus o May Shower, gently pat wound dry prior to applying new dressing. Anesthetic (add to Medication List) Wound #2 Left Calcaneus o Topical Lidocaine 4% cream applied to wound bed prior to debridement (In Clinic Only). Wound #3 Right Gluteus o Topical Lidocaine 4% cream applied to wound bed prior to debridement (In Clinic Only). Primary Wound Dressing Wound #2 Left Calcaneus o Iodoflex Wound #3 Right Gluteus o Collagen Secondary Dressing Wound #2 Left Calcaneus o Boardered Foam Dressing Wound #3 Right Gluteus o Boardered Foam Dressing Dressing Change Frequency Wound #2 Left Calcaneus o Change Dressing Monday, Wednesday, Friday Wound #3 Right Gluteus o Change Dressing Monday, Wednesday, Friday Follow-up Appointments Wound #2 Left Calcaneus o Return Appointment in 1 week. Wound #3 Right Gluteus o Return Appointment in 1 week. [name], [name] ([id number]) Edema Control Wound #2 Left Calcaneus o Elevate legs to the level of the heart and pump ankles as often as possible Wound #3 Right Gluteus o Elevate legs to the level of the heart and pump ankles as often as possible Off-Loading Wound #2 Left Calcaneus o Turn and reposition every 2 hours Wound #3 Right Gluteus o Turn and reposition every 2 hours Home Health Wound #2 Left  Wink Vis[facility] - [facility] o Home Health Nurse may visit PRN  to address patientos wound care needs. o FACE TO FACE ENCOUNTER: MEDICARE and MEDICAID PATIENTS: I certify that this patient is under my care and that I had a face-to- face encounter that meets the physician face-to-face encounter requirements with this patient on this date. The encounter with the patient was in whole or in part for the following MEDICAL CONDITION: (primary reason for [place]) MEDICAL NECESSITY: I certify, that based on my findings, NURSING services are a medically necessary home health service. HOME BOUND STATUS: I certify that my clinical findings support that this patient is homebound (i.e., Due to illness or injury, pt requires aid of supportive devices such as crutches, cane, wheelchairs, walkers, the use of special transportation or the assistance of another person to leave their place of residence. There is a normal inability to leave the home and doing so requires considerable and taxing effort. Other absences are for medical reasons / religious services and are infrequent or of short duration when for other reasons). o If current dressing causes regression in wound condition, may D/C ordered dressing product/s and apply Normal Saline Moist Dressing daily until next [place] / Other MD appointment. [place] of regression in wound condition at [phone]. o Please direct any NON-WOUND related issues/requests for orders to patient's Primary Care Physician Wound #3 Right [place] Nurse may visit PRN to address patientos wound care needs. o FACE TO FACE ENCOUNTER: MEDICARE and MEDICAID PATIENTS: I certify that this patient is under my care and that I had a face-to- face encounter that meets the physician face-to-face encounter requirements with this patient on this date. The  encounter with the patient was in whole or in part for the following MEDICAL CONDITION: (primary reason for [place]) MEDICAL NECESSITY: I certify, that based on my findings, NURSING services are a medically necessary home health service. HOME BOUND STATUS: I certify that my clinical findings support that this patient is homebound (i.e., Due to illness or injury, pt requires aid of supportive devices such as crutches, cane, wheelchairs, walkers, the use of special transportation or the assistance of another person to leave their place of residence. There is a normal inability to leave the home and doing so requires considerable and taxing effort. Other absences are for medical reasons / religious services and are infrequent or of short duration when for other reasons). o If current dressing causes regression in wound condition, may D/C ordered dressing product/s and apply Normal Saline Moist Dressing daily until next [place] / Other MD appointment. [place] of regression in wound condition at [phone]. o Please direct any NON-WOUND related issues/requests for orders to patient's [place] Nurse may visit PRN to address patientos wound care needs. o FACE TO FACE ENCOUNTER: MEDICARE and MEDICAID PATIENTS: I certify that this patient is under my care and that I had a face-to- face encounter that meets the physician face-to-face encounter requirements with this patient on this date. The encounter with the patient was in whole or in part for the following MEDICAL CONDITION: (primary reason for [place]) MEDICAL NECESSITY: I certify, that based on my findings, NURSING services are a medically necessary home health service. HOME BOUND STATUS: I certify that my clinical findings support that this patient is homebound (i.e., Due to illness or injury, pt requires aid of  supportive devices such as crutches,  cane, wheelchairs, walkers, the use of special transportation or the assistance of another person to leave their place of residence. There is a normal inability to leave the home and doing so requires considerable and taxing effort. Other absences are for medical reasons / religious services and are infrequent or of short duration when for other reasons). o If current dressing causes regression in wound condition, may D/C ordered dressing product/s and apply Normal Saline Moist Dressing daily until next [place] / Other MD appointment. [place] of regression in wound condition at [phone]. o Please direct any NON-WOUND related issues/requests for orders to patient's Primary Care Physician Electronic Signature(s) Signed: 09/02/2019 4:44:08 PM By: Army Melia Signed: 09/02/2019 4:59:35 PM By: Carla Drape, [place]. ([id number]) Entered By: Army Melia on 09/02/2019 16:08:17 [name], [name] ([id number]) -------------------------------------------------------------------------------- Problem List Details Patient Name: [name] Date of Service: 09/02/2019 3:15 PM Medical Record Number: [id number] Patient Account Number: [id number] Date of Birth/Sex: 02/24/1932 (86 y.o. M) Treating RN: Army Melia Primary Care Provider: [name] Other Clinician: Referring Provider: [name] Treating Provider/Extender: Melburn Hake, Joah Patlan Weeks in Treatment: 1 Active Problems ICD-10 Evaluated Encounter Code Description Active Date Today Diagnosis E11.621 Type 2 diabetes mellitus with foot ulcer 08/26/2019 No Yes L89.620 Pressure ulcer of left heel, unstageable 08/26/2019 No Yes S31.819A Unspecified open wound of right buttock, initial encounter 08/26/2019 No Yes I73.89 Other specified peripheral vascular diseases 08/26/2019 No Yes I10 Essential (primary) hypertension 08/26/2019 No Yes N18.4 Chronic kidney disease,  stage 4 (severe) 08/26/2019 No Yes Inactive Problems Resolved Problems Electronic Signature(s) Signed: 09/02/2019 3:41:50 PM By: Worthy Keeler PA-C Entered By: Worthy Keeler on 09/02/2019 15:41:50 [name], [name] ([id number]) -------------------------------------------------------------------------------- Progress Note Details Patient Name: [name] Date of Service: 09/02/2019 3:15 PM Medical Record Number: [id number] Patient Account Number: [id number] Date of Birth/Sex: [birth year] (87 y.o. M) Treating RN: Army Melia Primary Care Provider: [name] Other Clinician: Referring Provider: [name] Treating Provider/Extender: Melburn Hake, Doreena Maulden Weeks in Treatment: 1 Subjective Chief Complaint Information obtained from Patient Left heel and right gluteal ulcer History of Present Illness (HPI) 05/19/18 on evaluation today patient presents for initial evaluation and clinic concerning issues that he has been having with his right medial lower leg ulcer. The patient has actually been seen in the emergency department for cellulitis he was admitted at [facility] on 04/19/18 discharged 04/21/18. He was noted to have proof of vascular disease and did undergo an angiogram on 04/20/18 by Dr. Leotis Pain. 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 Subsequently Dr. dew following intervention noted that the patient had depending on the location 20-40% residual stenosis noted and significant improvement in the overall vascular flow. Obviously this is great news. With that being said the patient's wound he states does seem to be doing some better compared to previous. Upon inspection indeed it appears that he has some epithelialization. Overall I do not see any evidence of significant infection which is great news. No fevers chills noted 05/26/18 on evaluation today patient's wound  actually appears to be doing much better at this time. With that being said I do think the [facility] Dressing a be sticking according to my nurse and this may be causing some issues as well. I'm gonna suggest at this point that we likely add mepitel underneath the [facility] Dressing  help prevent this. Other than that everything seems to be doing excellent. 06/02/18 on evaluation today patient actually appears to be doing decently well in regard to his right lower extremity ulcer. This seems to show signs of good improvement which is excellent news. With that being said he did see vascular the good news is no further intervention is recommended nor necessary at this point. Actually placed in an [employer] and sent orders to home health which in the end it led to her switching the patient from the three layer compression wrap of the right lower extremity to an [employer] wrap. I feel like he did better in the three layer compression. With that being said the patient currently shows no signs of worsening the mepitel did help prevent any sticking to the wound bed which is great news. 06/12/18 an evaluation today patient presents for follow-up concerning his right lower extremity ulceration. The medial aspect ulcer appears to be doing fairly well this time which is good news. He has a blister on the lateral aspect although I'm concerned this may actually be due to the fact that his wrap is sliding down. We seem to be having issues with getting this wrapped appropriately were it will stay in place. Fortunately there does not appear to be any signs of infection at this time which is good news. He is seen with his son present at this point during the visit. 06/16/18 on evaluation today patient appears to be doing rather well and in fact appears to be almost completely healed in regard to his lower extremity ulcer on the right. The wrap much better this week with [place] wrapping it and just keeping him in that  same wrap until follow-up. For that reason we will discontinue home health services. Fortunately there does not appear to be the evidence of infection at this point. 06/23/18 on evaluation today patient appears to be doing very well in regard to his ulcer which in fact appears to be completely healed. He has been tolerating the wraps without complication although he is definitely ready to be out of these considering how well things have done. Fortunately there is not appear to be any signs of infection at this time which is great news. 08/26/2019 upon evaluation today patient appears to be doing somewhat poorly upon initial inspection here in our clinic for his current issue. This is a patient that I previously taken care of with regard to wounds on his legs. With that being said the issues that were having right now are actually separate from that he has a heel ulcer unfortunately and then subsequently also does have a wound in the right gluteal region that appears to be more of a skin tear. The patient does have a history of diabetes mellitus type 2, peripheral vascular disease, hypertension, and chronic kidney disease stage IV. He is also somewhat weak though he does like to walk around which is actually a good thing he does so slowly. He is currently at an assisted living facility at this point. 09/02/2019 upon evaluation today patient appears to be doing well with regard to the wound on his left heel as well as the right gluteal region. He has had the arterial studies I did look up the numbers and it appears that his TBI's were actually pretty good bilaterally measuring about 1 on the left and around 0.64 on the right. With that being said the final report is not here yet we should have that by  next week. Nonetheless looking at the results of the imaging I feel like he has good arterial flow to be able to proceed with debridement today especially on this left heel. [name], [name]  ([id number]) Objective Constitutional Well-nourished and well-hydrated in no acute distress. Vitals Time Taken: 3:15 PM, Height: 68 in, Weight: 145 lbs, BMI: 22, Temperature: 98.6 F, Pulse: 87 bpm, Respiratory Rate: 16 breaths/min, Blood Pressure: 113/48 mmHg. Respiratory normal breathing without difficulty. Psychiatric this patient is able to make decisions and demonstrates good insight into disease process. Alert and Oriented x 3. pleasant and cooperative. General Notes: Upon inspection patient seems to be doing quite well with regard to the wound in the gluteal region there was no need for debridement and this seems to be very close to complete resolution. With regard to the heel there was necrotic tissue noted in the base of the wound I did actually perform sharp debridement to clear away some of the necrotic debris today and the patient tolerated that without complication. Post debridement wound bed appears to be doing significantly better. Integumentary (Hair, Skin) Wound #2 status is Open. Original cause of wound was Gradually Appeared. The wound is located on the Left Calcaneus. The wound measures 1cm length x 1.3cm width x 0.2cm depth; 1.021cm^2 area and 0.204cm^3 volume. There is Fat Layer (Subcutaneous Tissue) Exposed exposed. There is no tunneling or undermining noted. There is a medium amount of serous drainage noted. The wound margin is flat and intact. There is medium (34- 66%) pink granulation within the wound bed. There is a medium (34-66%) amount of necrotic tissue within the wound bed including Adherent Slough. Wound #3 status is Open. Original cause of wound was Shear/Friction. The wound is located on the Right Gluteus. The wound measures 1cm length x 0.3cm width x 0.1cm depth; 0.236cm^2 area and 0.024cm^3 volume. There is Fat Layer (Subcutaneous Tissue) Exposed exposed. There is no tunneling or undermining noted. There is a medium amount of serous drainage noted. The wound  margin is flat and intact. There is medium (34-66%) pink granulation within the wound bed. There is a medium (34-66%) amount of necrotic tissue within the wound bed including Adherent Slough. 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 There was a Excisional Skin/Subcutaneous Tissue Debridement with a total area of 1.3 sq cm performed by STONE III, Jahaziel Francois E., PA-C. With the following instrument(s): Curette Material removed includes Callus, Subcutaneous Tissue, and Slough after achieving pain control using Lidocaine. A time out was conducted at 16:04, prior to the start of the procedure. A Minimum amount of bleeding was controlled with Pressure. The procedure was tolerated well. Post Debridement Measurements: 1cm length x 1.3cm width x 0.2cm depth; 0.204cm^3 volume. Post debridement Stage noted as Category/Stage III. Character of Wound/Ulcer Post Debridement is stable. Post procedure Diagnosis Wound #2: Same as Pre-Procedure [name], [name] R. ([id number]) Plan Wound Cleansing: Wound #2 Left Calcaneus: May Shower, gently pat wound dry prior to applying new dressing. Wound #3 Right Gluteus: May Shower, gently pat wound dry prior to applying new dressing. Anesthetic (add to Medication List): Wound #2 Left Calcaneus: Topical Lidocaine 4% cream applied to wound bed prior to debridement (In Clinic Only). Wound #3 Right Gluteus: Topical  Lidocaine 4% cream applied to wound bed prior to debridement (In Clinic Only). Primary Wound Dressing: Wound #2 Left Calcaneus: Iodoflex Wound #3 Right Gluteus: Collagen Secondary Dressing: Wound #2 Left  Calcaneus: Boardered Foam Dressing Wound #3 Right Gluteus: Boardered Foam Dressing Dressing Change Frequency: Wound #2 Left Calcaneus: Change Dressing Monday, Wednesday, Friday Wound #3 Right Gluteus: Change Dressing Monday, Wednesday, Friday Follow-up Appointments: Wound #2 Left Calcaneus: Return Appointment in 1 week. Wound #3 Right Gluteus: Return Appointment in 1 week. Edema Control: Wound #2 Left Calcaneus: Elevate legs to the level of the heart and pump ankles as often as possible Wound #3 Right Gluteus: Elevate legs to the level of the heart and pump ankles as often as possible Off-Loading: Wound #2 Left Calcaneus: Turn and reposition every 2 hours Wound #3 Right Gluteus: Turn and reposition every 2 hours Home Health: Wound #2 Left Calcaneus: Continue Home Health Visits - [facility] Nurse may visit PRN to address patient s wound care needs. FACE TO FACE ENCOUNTER: MEDICARE and MEDICAID PATIENTS: I certify that this patient is under my care and that I had a face-to-face encounter that meets the physician face-to-face encounter requirements with this patient on this date. The encounter with the patient was in whole or in part for the following MEDICAL CONDITION: (primary reason for [place]) MEDICAL NECESSITY: I certify, that based on my findings, NURSING services are a medically necessary home health service. HOME BOUND STATUS: I certify that my clinical findings support that this patient is homebound (i.e., Due to illness or injury, pt requires aid of supportive devices such as crutches, cane, wheelchairs, walkers, the use of special transportation or the assistance of another person to leave their place of residence. There is a normal inability to leave the home and doing so requires considerable and taxing effort. Other absences are for medical reasons / religious services and are infrequent or of short duration when for other reasons). If current  dressing causes regression in wound condition, may D/C ordered dressing product/s and apply Normal Saline Moist Dressing daily until next [place] / Other MD appointment. [place] of regression in wound condition at [phone]. Please direct any NON-WOUND related issues/requests for orders to patient's Primary Care Physician Wound #3 Right Gluteus: [place] Nurse may visit PRN to address patient s wound care needs. FACE TO FACE ENCOUNTER: MEDICARE and MEDICAID PATIENTS: I certify that this patient is under my care and that I had a face-to-face encounter that meets the physician face-to-face encounter requirements with this patient on this date. The encounter with the patient was in whole or in part for the following MEDICAL CONDITION: (primary reason for [place]) MEDICAL NECESSITY: I certify, that based on my findings, NURSING services are a medically necessary home health service. HOME BOUND STATUS: I certify that my clinical findings support that this patient is homebound (i.e., Due to illness or injury, pt requires aid of supportive devices such as crutches, cane, wheelchairs, walkers, the use of special transportation or the assistance of another person to leave their place of residence. There is a normal inability to leave the home and doing so requires considerable and taxing effort. Other absences are for medical reasons / religious services and are infrequent or of short duration when for other reasons). If current dressing causes regression in wound condition, may D/C ordered dressing product/s and apply Normal Saline Moist Dressing daily until next [place] / Other  MD appointment. [place] of regression in wound condition at [phone]. [name], [name] ([id number]) Please direct any NON-WOUND related issues/requests for orders to patient's [place] Nurse may visit PRN to address patient s wound care needs. FACE TO FACE ENCOUNTER: MEDICARE and MEDICAID PATIENTS: I certify that this patient is under my care and that I had a face-to-face encounter that meets the physician face-to-face encounter requirements with this patient on this date. The encounter with the patient was in whole or in part for the following MEDICAL CONDITION: (primary reason for [place]) MEDICAL NECESSITY: I certify, that based on my findings, NURSING services are a medically necessary home health service. HOME BOUND STATUS: I certify that my clinical findings support that this patient is homebound (i.e., Due to illness or injury, pt requires aid of supportive devices such as crutches, cane, wheelchairs, walkers, the use of special transportation or the assistance of another person to leave their place of residence. There is a normal inability to leave the home and doing so requires considerable and taxing effort. Other absences are for medical reasons / religious services and are infrequent or of short duration when for other reasons). If current dressing causes regression in wound condition, may D/C ordered dressing product/s and apply Normal Saline Moist Dressing daily until next [place] / Other MD appointment. [place] of regression in wound condition at [phone]. Please direct any NON-WOUND related issues/requests for orders to patient's Primary Care Physician 1. I would recommend currently we continue with the Iodoflex for the heel and the patient is in agreement with that plan. 2. I am also going to recommend at this point that we continue with collagen to the right gluteal region that seems to be doing well for the patient. 3. I am also can recommend currently that we continue with a border foam dressing to both locations for protection. 4. The patient needs to continue to reposition every  couple of hours in order to protect these areas and prevent any further breakdown or injury. We will see patient back for reevaluation in 1 week here in the clinic. If anything worsens or changes patient will contact our office for additional recommendations. Electronic Signature(s) Signed: 09/02/2019 4:54:12 PM By: Worthy Keeler PA-C Entered By: Worthy Keeler on 09/02/2019 16:54:11 [name], [name] Kitchen ([id number]) -------------------------------------------------------------------------------- SuperBill Details Patient Name: [name] Date of Service: 09/02/2019 Medical Record Number: [id number] Patient Account Number: [id number] Date of Birth/Sex: November 11, 1931 (87 y.o. M) Treating RN: Army Melia Primary Care Provider: [name] Other Clinician: Referring Provider: [name] Treating Provider/Extender: Mel[place] Hake, Jaivion Kingsley Weeks in Treatment: 1 Diagnosis Coding ICD-10 Codes Code Description E11.621 Type 2 diabetes mellitus with foot ulcer L89.620 Pressure ulcer of left heel, unstageable S31.819A Unspecified open wound of right buttock, initial encounter I73.89 Other specified peripheral vascular diseases I10 Essential (primary) hypertension N18.4 Chronic kidney disease, stage 4 (severe) Facility Procedures CPT4 Code: [id number] Description: 02637 - DEB SUBQ TISSUE 20 SQ CM/< Modifier: Quantity: 1 CPT4 Code: Description: ICD-10 Diagnosis Description L89.620 Pressure ulcer of left heel, unstageable Modifier: Quantity: Physician Procedures CPT4 Code: [id number] Description: 77412 - WC PHYS SUBQ TISS 20 SQ CM Modifier: Quantity: 1 CPT4 Code: Description: ICD-10 Diagnosis Description L89.620 Pressure ulcer of left heel, unstageable Modifier: Quantity: Electronic Signature(s) Signed: 09/02/2019 4:55:45 PM By: Worthy Keeler PA-C Entered By: Worthy Keeler on 09/02/2019 16:55:44

## 2019-09-09 ENCOUNTER — Other Ambulatory Visit: Payer: Self-pay

## 2019-09-09 ENCOUNTER — Encounter: Payer: Medicare Other | Attending: Physician Assistant | Admitting: Physician Assistant

## 2019-09-09 DIAGNOSIS — I129 Hypertensive chronic kidney disease with stage 1 through stage 4 chronic kidney disease, or unspecified chronic kidney disease: Secondary | ICD-10-CM | POA: Diagnosis not present

## 2019-09-09 DIAGNOSIS — L89620 Pressure ulcer of left heel, unstageable: Secondary | ICD-10-CM | POA: Diagnosis not present

## 2019-09-09 DIAGNOSIS — E11621 Type 2 diabetes mellitus with foot ulcer: Secondary | ICD-10-CM | POA: Insufficient documentation

## 2019-09-09 DIAGNOSIS — N184 Chronic kidney disease, stage 4 (severe): Secondary | ICD-10-CM | POA: Diagnosis not present

## 2019-09-09 DIAGNOSIS — S31819A Unspecified open wound of right buttock, initial encounter: Secondary | ICD-10-CM | POA: Diagnosis not present

## 2019-09-09 DIAGNOSIS — E1122 Type 2 diabetes mellitus with diabetic chronic kidney disease: Secondary | ICD-10-CM | POA: Insufficient documentation

## 2019-09-09 DIAGNOSIS — E1151 Type 2 diabetes mellitus with diabetic peripheral angiopathy without gangrene: Secondary | ICD-10-CM | POA: Diagnosis not present

## 2019-09-09 NOTE — Progress Notes (Addendum)
[name], [name] ([id number]) Visit Report for 09/09/2019 Chief Complaint Document Details Patient Name: [name], [name] Date of Service: 09/09/2019 11:15 AM Medical Record Number: [id number] Patient Account Number: [id number] Date of Birth/Sex: 1932/06/04 (86 y.o. M) Treating RN: Army Melia Primary Care Provider: [name] Other Clinician: Referring Provider: [name] Treating Provider/Extender: Melburn Hake, Murad Staples Weeks in Treatment: 2 Information Obtained from: Patient Chief Complaint Left heel and right gluteal ulcer Electronic Signature(s) Signed: 09/09/2019 11:30:41 AM By: Worthy Keeler PA-C Entered By: Worthy Keeler on 09/09/2019 11:30:40 [name] ([id number]) -------------------------------------------------------------------------------- HPI Details Patient Name: [name] Date of Service: 09/09/2019 11:15 AM Medical Record Number: [id number] Patient Account Number: [id number] Date of Birth/Sex: July 09, 1931 (87 y.o. M) Treating RN: Army Melia Primary Care Provider: [name] Other Clinician: Referring Provider: [name] Treating Provider/Extender: Melburn Hake, Osby Sweetin Weeks in Treatment: 2 History of Present Illness HPI Description: 05/19/18 on evaluation today patient presents for initial evaluation and clinic concerning issues that he has been having with his right medial lower leg ulcer. The patient has actually been seen in the emergency department for cellulitis he was admitted at [facility] on 04/19/18 discharged 04/21/18. He was noted to have proof of vascular disease and did undergo an angiogram on 04/20/18 by Dr. Leotis Pain. 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 Subsequently Dr. dew following intervention noted that the patient had depending on the location 20-40% residual stenosis noted and significant improvement in the overall vascular  flow. Obviously this is great news. With that being said the patient's wound he states does seem to be doing some better compared to previous. Upon inspection indeed it appears that he has some epithelialization. Overall I do not see any evidence of significant infection which is great news. No fevers chills noted 05/26/18 on evaluation today patient's wound actually appears to be doing much better at this time. With that being said I do think the [facility] Dressing a be sticking according to my nurse and this may be causing some issues as well. I'm gonna suggest at this point that we likely add mepitel underneath the [facility] Dressing help prevent this. Other than that everything seems to be doing excellent. 06/02/18 on evaluation today patient actually appears to be doing decently well in regard to his right lower extremity ulcer. This seems to show signs of good improvement which is excellent news. With that being said he did see vascular the good news is no further intervention is recommended nor necessary at this point. Actually placed in an [employer] and sent orders to home health which in the end it led to her switching the patient from the three layer compression wrap of the right lower extremity to an [employer] wrap. I feel like he did better in the three layer compression. With that being said the patient currently shows no signs of worsening the mepitel did help prevent any sticking to the wound bed which is great news. 06/12/18 an evaluation today patient presents for follow-up concerning his right lower extremity ulceration. The medial aspect ulcer appears to be doing fairly well this time which is good news. He has a blister on the lateral aspect although I'm concerned this may actually be due to the fact that his wrap is sliding down. We seem to be having issues with getting this wrapped appropriately were it will stay  in place. Fortunately there does not appear to be any signs  of infection at this time which is good news. He is seen with his son present at this point during the visit. 06/16/18 on evaluation today patient appears to be doing rather well and in fact appears to be almost completely healed in regard to his lower extremity ulcer on the right. The wrap much better this week with [place] wrapping it and just keeping him in that same wrap until follow-up. For that reason we will discontinue home health services. Fortunately there does not appear to be the evidence of infection at this point. 06/23/18 on evaluation today patient appears to be doing very well in regard to his ulcer which in fact appears to be completely healed. He has been tolerating the wraps without complication although he is definitely ready to be out of these considering how well things have done. Fortunately there is not appear to be any signs of infection at this time which is great news. 08/26/2019 upon evaluation today patient appears to be doing somewhat poorly upon initial inspection here in our clinic for his current issue. This is a patient that I previously taken care of with regard to wounds on his legs. With that being said the issues that were having right now are actually separate from that he has a heel ulcer unfortunately and then subsequently also does have a wound in the right gluteal region that appears to be more of a skin tear. The patient does have a history of diabetes mellitus type 2, peripheral vascular disease, hypertension, and chronic kidney disease stage IV. He is also somewhat weak though he does like to walk around which is actually a good thing he does so slowly. He is currently at an assisted living facility at this point. 09/02/2019 upon evaluation today patient appears to be doing well with regard to the wound on his left heel as well as the right gluteal region. He has had the arterial studies I did look up the numbers and it appears that his TBI's were actually pretty  good bilaterally measuring about 1 on the left and around 0.64 on the right. With that being said the final report is not here yet we should have that by next week. Nonetheless looking at the results of the imaging I feel like he has good arterial flow to be able to proceed with debridement today especially on this left heel. 09/09/2019 upon evaluation today patient's wounds actually appear to be showing signs of improvement. In the gluteal/sacral region he is showing signs of significant improvement and in fact I really do not see much that is exactly open although there is a lot of moisture type breakdown that I do see at this point. Nonetheless I believe that the patient can potentially transition from a dressing to just using a barrier cream in this area. In regard to the heel that it does appear to be showing signs of improvement I think the Iodoflex is doing a good job. Electronic Signature(s) Signed: 09/09/2019 1:01:32 PM By: Worthy Keeler PA-C Entered By: Worthy Keeler on 09/09/2019 13:01:32 [name], [name] ([id number]) -------------------------------------------------------------------------------- Physical Exam Details Patient Name: [name] Date of Service: 09/09/2019 11:15 AM Medical Record Number: [id number] Patient Account Number: [id number] Date of Birth/Sex: [birth year] (87 y.o. M) Treating RN: Army Melia Primary Care Provider: [name] Other Clinician: Referring Provider: [name] Treating Provider/Extender: Melburn Hake, Maclin Guerrette Weeks in Treatment: 2 Constitutional Well-nourished and well-hydrated in  no acute distress. Respiratory normal breathing without difficulty. Psychiatric this patient is able to make decisions and demonstrates good insight into disease process. Alert and Oriented x 3. pleasant and cooperative. Notes Patient's wound bed currently showed signs of good granulation at this time. Fortunately there is no signs of active infection at this point.  With that being said there was some slough buildup on the heel at this time that did require some sharp debridement he was able to tolerate this with minimal discomfort post debridement the wound bed appears to be doing much better and I think we are headed in the right direction. Electronic Signature(s) Signed: 09/09/2019 1:02:01 PM By: Worthy Keeler PA-C Entered By: Worthy Keeler on 09/09/2019 13:02:00 [name], [name] ([id number]) -------------------------------------------------------------------------------- Physician Orders Details Patient Name: [name] Date of Service: 09/09/2019 11:15 AM Medical Record Number: [id number] Patient Account Number: [id number] Date of Birth/Sex: 1931-11-18 (87 y.o. M) Treating RN: Army Melia Primary Care Provider: [name] Other Clinician: Referring Provider: [name] Treating Provider/Extender: Melburn Hake, Jacquette Canales Weeks in Treatment: 2 Verbal / Phone Orders: No Diagnosis Coding ICD-10 Coding Code Description E11.621 Type 2 diabetes mellitus with foot ulcer L89.620 Pressure ulcer of left heel, unstageable S31.819A Unspecified open wound of right buttock, initial encounter I73.89 Other specified peripheral vascular diseases I10 Essential (primary) hypertension N18.4 Chronic kidney disease, stage 4 (severe) Wound Cleansing Wound #2 Left Calcaneus o May Shower, gently pat wound dry prior to applying new dressing. Wound #3 Right Gluteus o May Shower, gently pat wound dry prior to applying new dressing. Anesthetic (add to Medication List) Wound #2 Left Calcaneus o Topical Lidocaine 4% cream applied to wound bed prior to debridement (In Clinic Only). - in clinic only Wound #3 Right Gluteus o Topical Lidocaine 4% cream applied to wound bed prior to debridement (In Clinic Only). - in clinic only Skin Barriers/Peri-Wound Care Wound #3 Right Gluteus o Barrier cream Primary Wound Dressing Wound #2 Left Calcaneus o  Iodoflex Secondary Dressing Wound #2 Left Calcaneus o Boardered Foam Dressing Dressing Change Frequency Wound #2 Left Calcaneus o Change Dressing Monday, Wednesday, Friday Wound #3 Right Gluteus o Change Dressing Monday, Wednesday, Friday Follow-up Appointments Wound #2 Left Calcaneus o Return Appointment in 1 week. Wound #3 Right Gluteus o Return Appointment in 1 week. TRAETON, BORDAS ([id number]) Edema Control Wound #2 Left Calcaneus o Elevate legs to the level of the heart and pump ankles as often as possible Off-Loading Wound #2 Left Calcaneus o Turn and reposition every 2 hours Wound #3 Right Gluteus o Turn and reposition every 2 hours Home Health Wound #2 Left [place] [facility] o Home Health Nurse may visit PRN to address patientos wound care needs. o FACE TO FACE ENCOUNTER: MEDICARE and MEDICAID PATIENTS: I certify that this patient is under my care and that I had a face-to- face encounter that meets the physician face-to-face encounter requirements with this patient on this date. The encounter with the patient was in whole or in part for the following MEDICAL CONDITION: (primary reason for [place]) MEDICAL NECESSITY: I certify, that based on my findings, NURSING services are a medically necessary home health service. HOME BOUND STATUS: I certify that my clinical findings support that this patient is homebound (i.e., Due to illness or injury, pt requires aid of supportive devices such as crutches, cane, wheelchairs, walkers, the use of special transportation or the assistance of another person to leave their place of residence. There  is a normal inability to leave the home and doing so requires considerable and taxing effort. Other absences are for medical reasons / religious services and are infrequent or of short duration when for other reasons). o If current dressing causes regression in wound condition, may  D/C ordered dressing product/s and apply Normal Saline Moist Dressing daily until next [place] / Other MD appointment. [place] of regression in wound condition at [phone]. o Please direct any NON-WOUND related issues/requests for orders to patient's Primary Care Physician Wound #3 Right [place] Nurse may visit PRN to address patientos wound care needs. o FACE TO FACE ENCOUNTER: MEDICARE and MEDICAID PATIENTS: I certify that this patient is under my care and that I had a face-to- face encounter that meets the physician face-to-face encounter requirements with this patient on this date. The encounter with the patient was in whole or in part for the following MEDICAL CONDITION: (primary reason for [place]) MEDICAL NECESSITY: I certify, that based on my findings, NURSING services are a medically necessary home health service. HOME BOUND STATUS: I certify that my clinical findings support that this patient is homebound (i.e., Due to illness or injury, pt requires aid of supportive devices such as crutches, cane, wheelchairs, walkers, the use of special transportation or the assistance of another person to leave their place of residence. There is a normal inability to leave the home and doing so requires considerable and taxing effort. Other absences are for medical reasons / religious services and are infrequent or of short duration when for other reasons). o If current dressing causes regression in wound condition, may D/C ordered dressing product/s and apply Normal Saline Moist Dressing daily until next [place] / Other MD appointment. [place] of regression in wound condition at [phone]. o Please direct any NON-WOUND related issues/requests for orders to patient's Primary Care Physician Electronic Signature(s) Signed: 09/09/2019 3:58:38 PM By: Army Melia Signed: 09/09/2019 5:29:46 PM By: Worthy Keeler PA-C Entered By: Army Melia on 09/09/2019 11:49:55 [place], Ignatius [name] ([id number]) -------------------------------------------------------------------------------- Problem List Details Patient Name: [name] Date of Service: 09/09/2019 11:15 AM Medical Record Number: [id number] Patient Account Number: [id number] Date of Birth/Sex: [birth year] (86 y.o. M) Treating RN: Army Melia Primary Care Provider: [name] Other Clinician: Referring Provider: [name] Treating Provider/Extender: Melburn Hake, Ashir Kunz Weeks in Treatment: 2 Active Problems ICD-10 Evaluated Encounter Code Description Active Date Today Diagnosis E11.621 Type 2 diabetes mellitus with foot ulcer 08/26/2019 No Yes L89.620 Pressure ulcer of left heel, unstageable 08/26/2019 No Yes S31.819A Unspecified open wound of right buttock, initial encounter 08/26/2019 No Yes I73.89 Other specified peripheral vascular diseases 08/26/2019 No Yes I10 Essential (primary) hypertension 08/26/2019 No Yes N18.4 Chronic kidney disease, stage 4 (severe) 08/26/2019 No Yes Inactive Problems Resolved Problems Electronic Signature(s) Signed: 09/09/2019 11:30:33 AM By: Worthy Keeler PA-C Entered By: Worthy Keeler on 09/09/2019 11:30:32 [place], Ignatius [name] ([id number]) -------------------------------------------------------------------------------- Progress Note Details Patient Name: [name] Date of Service: 09/09/2019 11:15 AM Medical Record Number: [id number] Patient Account Number: [id number] Date of Birth/Sex: 03-03-1932 (87 y.o. M) Treating RN: Army Melia Primary Care Provider: [name] Other Clinician: Referring Provider: [name] Treating Provider/Extender: Melburn Hake, Valerya Maxton Weeks in Treatment: 2 Subjective Chief Complaint Information obtained from Patient Left heel and right gluteal ulcer History of Present Illness (HPI) 05/19/18 on evaluation today patient  presents for initial evaluation and  clinic concerning issues that he has been having with his right medial lower leg ulcer. The patient has actually been seen in the emergency department for cellulitis he was admitted at [facility] on 04/19/18 discharged 04/21/18. He was noted to have proof of vascular disease and did undergo an angiogram on 04/20/18 by Dr. Leotis Pain. 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 Subsequently Dr. dew following intervention noted that the patient had depending on the location 20-40% residual stenosis noted and significant improvement in the overall vascular flow. Obviously this is great news. With that being said the patient's wound he states does seem to be doing some better compared to previous. Upon inspection indeed it appears that he has some epithelialization. Overall I do not see any evidence of significant infection which is great news. No fevers chills noted 05/26/18 on evaluation today patient's wound actually appears to be doing much better at this time. With that being said I do think the [facility] Dressing a be sticking according to my nurse and this may be causing some issues as well. I'm gonna suggest at this point that we likely add mepitel underneath the [facility] Dressing help prevent this. Other than that everything seems to be doing excellent. 06/02/18 on evaluation today patient actually appears to be doing decently well in regard to his right lower extremity ulcer. This seems to show signs of good improvement which is excellent news. With that being said he did see vascular the good news is no further intervention is recommended nor necessary at this point. Actually placed in an [employer] and sent orders to home health which in the end it led to her switching the patient from the three layer compression wrap of the right lower  extremity to an [employer] wrap. I feel like he did better in the three layer compression. With that being said the patient currently shows no signs of worsening the mepitel did help prevent any sticking to the wound bed which is great news. 06/12/18 an evaluation today patient presents for follow-up concerning his right lower extremity ulceration. The medial aspect ulcer appears to be doing fairly well this time which is good news. He has a blister on the lateral aspect although I'm concerned this may actually be due to the fact that his wrap is sliding down. We seem to be having issues with getting this wrapped appropriately were it will stay in place. Fortunately there does not appear to be any signs of infection at this time which is good news. He is seen with his son present at this point during the visit. 06/16/18 on evaluation today patient appears to be doing rather well and in fact appears to be almost completely healed in regard to his lower extremity ulcer on the right. The wrap much better this week with [place] wrapping it and just keeping him in that same wrap until follow-up. For that reason we will discontinue home health services. Fortunately there does not appear to be the evidence of infection at this point. 06/23/18 on evaluation today patient appears to be doing very well in regard to his ulcer which in fact appears to be completely healed. He has been tolerating the wraps without complication although he is definitely ready to be out of these considering how well things have done. Fortunately there is not appear to be any signs of infection at  this time which is great news. 08/26/2019 upon evaluation today patient appears to be doing somewhat poorly upon initial inspection here in our clinic for his current issue. This is a patient that I previously taken care of with regard to wounds on his legs. With that being said the issues that were having right now are actually separate from that he  has a heel ulcer unfortunately and then subsequently also does have a wound in the right gluteal region that appears to be more of a skin tear. The patient does have a history of diabetes mellitus type 2, peripheral vascular disease, hypertension, and chronic kidney disease stage IV. He is also somewhat weak though he does like to walk around which is actually a good thing he does so slowly. He is currently at an assisted living facility at this point. 09/02/2019 upon evaluation today patient appears to be doing well with regard to the wound on his left heel as well as the right gluteal region. He has had the arterial studies I did look up the numbers and it appears that his TBI's were actually pretty good bilaterally measuring about 1 on the left and around 0.64 on the right. With that being said the final report is not here yet we should have that by next week. Nonetheless looking at the results of the imaging I feel like he has good arterial flow to be able to proceed with debridement today especially on this left heel. 09/09/2019 upon evaluation today patient's wounds actually appear to be showing signs of improvement. In the gluteal/sacral region he is showing signs of significant improvement and in fact I really do not see much that is exactly open although there is a lot of moisture type breakdown that I do see at this point. Nonetheless I believe that the patient can potentially transition from a dressing to just using a barrier cream in this area. In regard to the heel that it does appear to be showing signs of improvement I think the Iodoflex is doing a good job. [name], [name] ([id number]) Objective Constitutional Well-nourished and well-hydrated in no acute distress. Vitals Time Taken: 11:22 AM, Height: 68 in, Weight: 145 lbs, BMI: 22, Temperature: 97.7 F, Pulse: 78 bpm, Respiratory Rate: 16 breaths/min, Blood Pressure: 139/57 mmHg. Respiratory normal breathing without  difficulty. Psychiatric this patient is able to make decisions and demonstrates good insight into disease process. Alert and Oriented x 3. pleasant and cooperative. General Notes: Patient's wound bed currently showed signs of good granulation at this time. Fortunately there is no signs of active infection at this point. With that being said there was some slough buildup on the heel at this time that did require some sharp debridement he was able to tolerate this with minimal discomfort post debridement the wound bed appears to be doing much better and I think we are headed in the right direction. Integumentary (Hair, Skin) Wound #2 status is Open. Original cause of wound was Gradually Appeared. The wound is located on the Left Calcaneus. The wound measures 1.1cm length x 1.4cm width x 0.4cm depth; 1.21cm^2 area and 0.484cm^3 volume. There is Fat Layer (Subcutaneous Tissue) Exposed exposed. There is a medium amount of serous drainage noted. The wound margin is flat and intact. There is medium (34-66%) pink granulation within the wound bed. There is a medium (34-66%) amount of necrotic tissue within the wound bed including Adherent Slough. Wound #3 status is Open. Original cause of wound was Shear/Friction. The wound is located  on the Right Gluteus. The wound measures 2cm length x 1cm width x 0.1cm depth; 1.571cm^2 area and 0.157cm^3 volume. There is Fat Layer (Subcutaneous Tissue) Exposed exposed. There is a medium amount of serous drainage noted. The wound margin is flat and intact. There is medium (34-66%) pink granulation within the wound bed. There is a medium (34-66%) amount of necrotic tissue within the wound bed including Adherent Slough. Assessment Active Problems ICD-10 Type 2 diabetes mellitus with foot ulcer Pressure ulcer of left heel, unstageable Unspecified open wound of right buttock, initial encounter Other specified peripheral vascular diseases Essential (primary)  hypertension Chronic kidney disease, stage 4 (severe) Plan Wound Cleansing: Wound #2 Left Calcaneus: May Shower, gently pat wound dry prior to applying new dressing. Wound #3 Right Gluteus: May Shower, gently pat wound dry prior to applying new dressing. Anesthetic (add to Medication List): Wound #2 Left Calcaneus: Topical Lidocaine 4% cream applied to wound bed prior to debridement (In Clinic Only). - in clinic only Wound #3 Right Gluteus: Topical Lidocaine 4% cream applied to wound bed prior to debridement (In Clinic Only). - in clinic only Skin Barriers/Peri-Wound Care: Wound #3 Right Gluteus: Barrier cream [name], [name] R. ([id number]) Primary Wound Dressing: Wound #2 Left Calcaneus: Iodoflex Secondary Dressing: Wound #2 Left Calcaneus: Boardered Foam Dressing Dressing Change Frequency: Wound #2 Left Calcaneus: Change Dressing Monday, Wednesday, Friday Wound #3 Right Gluteus: Change Dressing Monday, Wednesday, Friday Follow-up Appointments: Wound #2 Left Calcaneus: Return Appointment in 1 week. Wound #3 Right Gluteus: Return Appointment in 1 week. Edema Control: Wound #2 Left Calcaneus: Elevate legs to the level of the heart and pump ankles as often as possible Off-Loading: Wound #2 Left Calcaneus: Turn and reposition every 2 hours Wound #3 Right Gluteus: Turn and reposition every 2 hours Home Health: Wound #2 Left Calcaneus: Continue Home Health Visits - [facility] Nurse may visit PRN to address patient s wound care needs. FACE TO FACE ENCOUNTER: MEDICARE and MEDICAID PATIENTS: I certify that this patient is under my care and that I had a face-to-face encounter that meets the physician face-to-face encounter requirements with this patient on this date. The encounter with the patient was in whole or in part for the following MEDICAL CONDITION: (primary reason for [place]) MEDICAL NECESSITY: I certify, that based on my findings, NURSING services are  a medically necessary home health service. HOME BOUND STATUS: I certify that my clinical findings support that this patient is homebound (i.e., Due to illness or injury, pt requires aid of supportive devices such as crutches, cane, wheelchairs, walkers, the use of special transportation or the assistance of another person to leave their place of residence. There is a normal inability to leave the home and doing so requires considerable and taxing effort. Other absences are for medical reasons / religious services and are infrequent or of short duration when for other reasons). If current dressing causes regression in wound condition, may D/C ordered dressing product/s and apply Normal Saline Moist Dressing daily until next [place] / Other MD appointment. [place] of regression in wound condition at [phone]. Please direct any NON-WOUND related issues/requests for orders to patient's Primary Care Physician Wound #3 Right Gluteus: [place] Nurse may visit PRN to address patient s wound care needs. FACE TO FACE ENCOUNTER: MEDICARE and MEDICAID PATIENTS: I certify that this patient is under my care and that I had a face-to-face encounter that meets the physician face-to-face encounter  requirements with this patient on this date. The encounter with the patient was in whole or in part for the following MEDICAL CONDITION: (primary reason for [place]) MEDICAL NECESSITY: I certify, that based on my findings, NURSING services are a medically necessary home health service. HOME BOUND STATUS: I certify that my clinical findings support that this patient is homebound (i.e., Due to illness or injury, pt requires aid of supportive devices such as crutches, cane, wheelchairs, walkers, the use of special transportation or the assistance of another person to leave their place of residence. There is a normal inability to leave the  home and doing so requires considerable and taxing effort. Other absences are for medical reasons / religious services and are infrequent or of short duration when for other reasons). If current dressing causes regression in wound condition, may D/C ordered dressing product/s and apply Normal Saline Moist Dressing daily until next [place] / Other MD appointment. [place] of regression in wound condition at [phone]. Please direct any NON-WOUND related issues/requests for orders to patient's Primary Care Physician 1. I would recommend currently that we go ahead and continue with the Iodoflex to the heel as that seems to be doing very well for him. 2. I am also can recommend at this point that we go ahead and continue with the treatment of the gluteal region in particular and will get a use barrier cream over this area to protect it no dressing applied. 3. The patient does need to continue with appropriate offloading at this point to prevent any future breakdown. We will see patient back for reevaluation in 1 week here in the clinic. If anything worsens or changes patient will contact our office for additional recommendations. Electronic Signature(s) Signed: 09/09/2019 1:02:40 PM By: Worthy Keeler PA-C Entered By: Worthy Keeler on 09/09/2019 13:02:39 [name], [name] ([id number], [name] ([id number]) -------------------------------------------------------------------------------- SuperBill Details Patient Name: [name] Date of Service: 09/09/2019 Medical Record Number: [id number] Patient Account Number: [id number] Date of Birth/Sex: 1931-08-15 (87 y.o. 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, unstageable S31.819A Unspecified open wound of right buttock, initial encounter I73.89 Other specified peripheral vascular diseases I10 Essential (primary) hypertension N18.4 Chronic kidney disease, stage 4 (severe) Facility Procedures CPT4 Code: 10932355 Description: 99213 - WOUND CARE VISIT-LEV 3 EST PT Modifier: Quantity: 1 Physician Procedures CPT4 Code: 7322025 Description: 42706 - WC PHYS LEVEL 3 - EST PT Modifier: Quantity: 1 CPT4 Code: Description: ICD-10 Diagnosis Description E11.621 Type 2 diabetes mellitus with foot ulcer L89.620 Pressure ulcer of left heel, unstageable S31.819A Unspecified open wound of right buttock, initial encounter I73.89 Other specified peripheral vascular  diseases Modifier: Quantity: Electronic Signature(s) Signed: 09/09/2019 1:10:55 PM By: Worthy Keeler PA-C Entered By: Worthy Keeler on 09/09/2019 13:10:54

## 2019-09-09 NOTE — Progress Notes (Signed)
DILLIN, LOFGREN ([id number]) Visit Report for 09/09/2019 Arrival Information Details Patient Name: [name], [name] Date of Service: 09/09/2019 11:15 AM Medical Record Number: [id number] Patient Account Number: [id number] Date of Birth/Sex: [birth year] (86 y.o. M) Treating RN: Army Melia Primary Care Afton Mikelson: [name] Other Clinician: Referring Kamry Faraci: [name] Treating Montrice Gracey/Extender: Melburn Hake, HOYT Weeks in Treatment: 2 Visit Information History Since Last Visit Added or deleted any medications: No Patient Arrived: [name] Any new allergies or adverse reactions: No Arrival Time: 11:22 Had a fall or experienced change in No Accompanied By: self activities of daily living that may affect Transfer Assistance: None risk of falls: Patient Identification Verified: Yes Signs or symptoms of abuse/neglect since last visito No Hospitalized since last visit: No Has Dressing in Place as Prescribed: Yes Pain Present Now: No Electronic Signature(s) Signed: 09/09/2019 3:58:38 PM By: Army Melia Entered By: Army Melia on 09/09/2019 11:22:20 Nolden, [name] ([id number]) -------------------------------------------------------------------------------- Clinic Level of Care Assessment Details Patient Name: [name] Date of Service: 09/09/2019 11:15 AM Medical Record Number: [id number] Patient Account Number: [id number] Date of Birth/Sex: 12-27-1931 (86 y.o. M) Treating RN: Army Melia Primary Care Saddie Sandeen: [name] Other Clinician: Referring Shruti Arrey: [name] Treating Janyth Riera/Extender: Melburn Hake, HOYT Weeks in Treatment: 2 Clinic Level of Care Assessment Items TOOL 4 Quantity Score []  - Use when only an EandM is performed on FOLLOW-UP visit 0 ASSESSMENTS - Nursing Assessment / Reassessment X - Reassessment of Co-morbidities (includes updates in patient status) 1 10 X- 1 5 Reassessment of Adherence to Treatment Plan ASSESSMENTS - Wound and Skin Assessment /  Reassessment []  - Simple Wound Assessment / Reassessment - one wound 0 X- 2 5 Complex Wound Assessment / Reassessment - multiple wounds []  - 0 Dermatologic / Skin Assessment (not related to wound area) ASSESSMENTS - Focused Assessment []  - Circumferential Edema Measurements - multi extremities 0 []  - 0 Nutritional Assessment / Counseling / Intervention []  - 0 Lower Extremity Assessment (monofilament, tuning fork, pulses) []  - 0 Peripheral Arterial Disease Assessment (using hand held doppler) ASSESSMENTS - Ostomy and/or Continence Assessment and Care []  - Incontinence Assessment and Management 0 []  - 0 Ostomy Care Assessment and Management (repouching, etc.) PROCESS - Coordination of Care X - Simple Patient / Family Education for ongoing care 1 15 []  - 0 Complex (extensive) Patient / Family Education for ongoing care []  - 0 Staff obtains Programmer, systems, Records, Test Results / Process Orders []  - 0 Staff telephones HHA, Nursing Homes / Clarify orders / etc []  - 0 Routine Transfer to another Facility (non-emergent condition) []  - 0 Routine Hospital Admission (non-emergent condition) []  - 0 New Admissions / Biomedical engineer / Ordering NPWT, Apligraf, etc. []  - 0 Emergency Hospital Admission (emergent condition) X- 1 10 Simple Discharge Coordination []  - 0 Complex (extensive) Discharge Coordination PROCESS - Special Needs []  - Pediatric / Minor Patient Management 0 []  - 0 Isolation Patient Management []  - 0 Hearing / Language / Visual special needs []  - 0 Assessment of Community assistance (transportation, D/C planning, etc.) Dech, Saylor R. ([id number]) []  - 0 Additional assistance / Altered mentation []  - 0 Support Surface(s) Assessment (bed, cushion, seat, etc.) INTERVENTIONS - Wound Cleansing / Measurement []  - Simple Wound Cleansing - one wound 0 X- 2 5 Complex Wound Cleansing - multiple wounds X- 1 5 Wound Imaging (photographs - any number of wounds) []  -  0 Wound Tracing (instead of photographs) []  - 0 Simple Wound Measurement - one wound X- 2 5  Complex Wound Measurement - multiple wounds INTERVENTIONS - Wound Dressings []  - Small Wound Dressing one or multiple wounds 0 X- 2 15 Medium Wound Dressing one or multiple wounds []  - 0 Large Wound Dressing one or multiple wounds []  - 0 Application of Medications - topical []  - 0 Application of Medications - injection INTERVENTIONS - Miscellaneous []  - External ear exam 0 []  - 0 Specimen Collection (cultures, biopsies, blood, body fluids, etc.) []  - 0 Specimen(s) / Culture(s) sent or taken to Lab for analysis []  - 0 Patient Transfer (multiple staff / Civil Service fast streamer / Similar devices) []  - 0 Simple Staple / Suture removal (25 or less) []  - 0 Complex Staple / Suture removal (26 or more) []  - 0 Hypo / Hyperglycemic Management (close monitor of Blood Glucose) []  - 0 Ankle / Brachial Index (ABI) - do not check if billed separately X- 1 5 Vital Signs Has the patient been seen at the hospital within the last three years: Yes Total Score: 110 Level Of Care: New/Established - Level 3 Electronic Signature(s) Signed: 09/09/2019 3:58:38 PM By: Army Melia Entered By: Army Melia on 09/09/2019 11:52:53 Geibel, [name] ([id number]) -------------------------------------------------------------------------------- Encounter Discharge Information Details Patient Name: [name] Date of Service: 09/09/2019 11:15 AM Medical Record Number: [id number] Patient Account Number: [id number] Date of Birth/Sex: June 02, 1932 (86 y.o. M) Treating RN: Army Melia Primary Care Jenavee Laguardia: [name] Other Clinician: Referring Zackeriah Kissler: [name] Treating Demarco Bacci/Extender: Melburn Hake, HOYT Weeks in Treatment: 2 Encounter Discharge Information Items Discharge Condition: Stable Ambulatory Status: Walker Discharge Destination: Home Transportation: Private Auto Accompanied By: daughter Schedule Follow-up  Appointment: Yes Clinical Summary of Care: Electronic Signature(s) Signed: 09/09/2019 3:58:38 PM By: Army Melia Entered By: Army Melia on 09/09/2019 11:53:52 Pates, [name] ([id number]) -------------------------------------------------------------------------------- Lower Extremity Assessment Details Patient Name: [name] Date of Service: 09/09/2019 11:15 AM Medical Record Number: [id number] Patient Account Number: [id number] Date of Birth/Sex: Aug 18, 1931 (87 y.o. M) Treating RN: Army Melia Primary Care Estanislado Surgeon: [name] Other Clinician: Referring Eyonna Sandstrom: [name] Treating Ozetta Flatley/Extender: Melburn Hake, HOYT Weeks in Treatment: 2 Edema Assessment Assessed: [Left: No] [Right: No] Edema: [Left: No] [Right: No] Electronic Signature(s) Signed: 09/09/2019 3:58:38 PM By: Army Melia Entered By: Army Melia on 09/09/2019 11:31:20 Lafon, [name] ([id number]) -------------------------------------------------------------------------------- Multi Wound Chart Details Patient Name: [name] Date of Service: 09/09/2019 11:15 AM Medical Record Number: [id number] Patient Account Number: [id number] Date of Birth/Sex: 12/29/1931 (86 y.o. M) Treating RN: Army Melia Primary Care Lillianah Swartzentruber: [name] Other Clinician: Referring Haskell Rihn: [name] Treating Lorayne Getchell/Extender: Melburn Hake, HOYT Weeks in Treatment: 2 Vital Signs Height(in): 49 Pulse(bpm): 52 Weight(lbs): 145 Blood Pressure(mmHg): 139/57 Body Mass Index(BMI): 22 Temperature(F): 97.7 Respiratory Rate(breaths/min): 16 Photos: [N/A:N/A] Wound Location: Left Calcaneus Right Gluteus N/A Wounding Event: Gradually Appeared Shear/Friction N/A Primary Etiology: Pressure Ulcer Pressure Ulcer N/A Comorbid History: Cataracts, Hypertension, Peripheral Cataracts, Hypertension, Peripheral N/A Venous Disease, Type II Diabetes, Venous Disease, Type II Diabetes, End Stage Renal Disease End Stage Renal Disease Date  Acquired: 07/26/2019 08/23/2019 N/A Weeks of Treatment: 2 2 N/A Wound Status: Open Open N/A Measurements L x W x D (cm) 1.1x1.4x0.4 2x1x0.1 N/A Area (cm) : 1.21 1.571 N/A Volume (cm) : 0.484 0.157 N/A % Reduction in Area: 15.30% 21.60% N/A % Reduction in Volume: -238.50% 21.50% N/A Classification: Category/Stage III Category/Stage II N/A Exudate Amount: Medium Medium N/A Exudate Type: Serous Serous N/A Exudate Color: amber amber N/A Wound Margin: Flat and Intact Flat and Intact N/A Granulation Amount: Medium (  34-66%) Medium (34-66%) N/A Granulation Quality: Pink Pink N/A Necrotic Amount: Medium (34-66%) Medium (34-66%) N/A Exposed Structures: Fat Layer (Subcutaneous Tissue) Fat Layer (Subcutaneous Tissue) N/A Exposed: Yes Exposed: Yes Fascia: No Fascia: No Tendon: No Tendon: No Muscle: No Muscle: No Joint: No Joint: No Bone: No Bone: No Epithelialization: None Medium (34-66%) N/A Treatment Notes Electronic Signature(s) Signed: 09/09/2019 3:58:38 PM By: Army Melia Entered By: Army Melia on 09/09/2019 11:47:09 Junker, [name] ([id number]) Rausch, [name] ([id number]) -------------------------------------------------------------------------------- [place] Details Patient Name: [name] Date of Service: 09/09/2019 11:15 AM Medical Record Number: [id number] Patient Account Number: [id number] Date of Birth/Sex: 1932/04/02 (86 y.o. M) Treating RN: Army Melia Primary Care Kseniya Grunden: [name] Other Clinician: Referring Janeth Terry: [name] Treating Tyrell Seifer/Extender: Melburn Hake, HOYT Weeks in Treatment: 2 Active Inactive Abuse / Safety / Falls / Self Care Management Nursing Diagnoses: Impaired physical mobility Goals: Patient/caregiver will verbalize understanding of skin care regimen Date Initiated: 08/26/2019 Target Resolution Date: 09/08/2019 Goal Status: Active Interventions: Assess fall risk on admission and as  needed Notes: Orientation to the Wound Care Program Nursing Diagnoses: Knowledge deficit related to the wound healing center program Goals: Patient/caregiver will verbalize understanding of the [place] Program Date Initiated: 08/26/2019 Target Resolution Date: 09/08/2019 Goal Status: Active Interventions: Provide education on orientation to the wound center Notes: Pressure Nursing Diagnoses: Knowledge deficit related to causes and risk factors for pressure ulcer development Goals: Patient will remain free from development of additional pressure ulcers Date Initiated: 08/26/2019 Target Resolution Date: 09/08/2019 Goal Status: Active Interventions: Assess: immobility, friction, shearing, incontinence upon admission and as needed Notes: Wound/Skin Impairment Nursing Diagnoses: Impaired tissue integrity Virden, Austin R. ([id number]) Goals: Ulcer/skin breakdown will have a volume reduction of 30% by week 4 Date Initiated: 08/26/2019 Target Resolution Date: 09/26/2019 Goal Status: Active Interventions: Assess patient/caregiver ability to obtain necessary supplies Treatment Activities: Skin care regimen initiated : 08/26/2019 Notes: Electronic Signature(s) Signed: 09/09/2019 3:58:38 PM By: Army Melia Entered By: Army Melia on 09/09/2019 11:46:31 Ressel, [name] ([id number]) -------------------------------------------------------------------------------- Pain Assessment Details Patient Name: [name] Date of Service: 09/09/2019 11:15 AM Medical Record Number: [id number] Patient Account Number: [id number] Date of Birth/Sex: 02/21/1932 (86 y.o. M) Treating RN: Army Melia Primary Care Analissa Bayless: [name] Other Clinician: Referring Merek Niu: [name] Treating Domenique Quest/Extender: Melburn Hake, HOYT Weeks in Treatment: 2 Active Problems Location of Pain Severity and Description of Pain Patient Has Paino No Site Locations Pain Management and Medication Current  Pain Management: Electronic Signature(s) Signed: 09/09/2019 3:58:38 PM By: Army Melia Entered By: Army Melia on 09/09/2019 11:22:51 Soldo, [name] ([id number]) -------------------------------------------------------------------------------- Patient/Caregiver Education Details Patient Name: [name] Date of Service: 09/09/2019 11:15 AM Medical Record Number: [id number] Patient Account Number: [id number] Date of Birth/Gender: [birth year] (86 y.o. M) Treating RN: Army Melia Primary Care Physician: [name] Other Clinician: Referring Physician: [name] Treating Physician/Extender: Sharalyn Ink in Treatment: 2 Education Assessment Education Provided To: Patient Education Topics Provided Wound/Skin Impairment: Handouts: Caring for Your Ulcer Methods: Demonstration, Explain/Verbal Responses: State content correctly Electronic Signature(s) Signed: 09/09/2019 3:58:38 PM By: Army Melia Entered By: Army Melia on 09/09/2019 11:53:05 Deshazer, [name] ([id number]) -------------------------------------------------------------------------------- Wound Assessment Details Patient Name: [name] Date of Service: 09/09/2019 11:15 AM Medical Record Number: [id number] Patient Account Number: [id number] Date of Birth/Sex: [birth year] (87 y.o. M) Treating RN: Army Melia Primary Care Efrat Zuidema: [name] Other Clinician: Referring Trelyn Vanderlinde: [name] Treating Jowanda Heeg/Extender: STONE III, HOYT Weeks in Treatment: 2 Wound  Status Wound Number: 2 Primary Pressure Ulcer Etiology: Wound Location: Left Calcaneus Wound Open Wounding Event: Gradually Appeared Status: Date Acquired: 07/26/2019 Comorbid Cataracts, Hypertension, Peripheral Venous Disease, Type Weeks Of Treatment: 2 History: II Diabetes, End Stage Renal Disease Clustered Wound: No Photos Wound Measurements Length: (cm) 1.1 % R Width: (cm) 1.4 % R Depth: (cm) 0.4 Epi Area: (cm) 1.21 Volume: (cm)  0.484 eduction in Area: 15.3% eduction in Volume: -238.5% thelialization: None Wound Description Classification: Category/Stage III Fo Wound Margin: Flat and Intact Sl Exudate Amount: Medium Exudate Type: Serous Exudate Color: amber ul Odor After Cleansing: No ough/Fibrino Yes Wound Bed Granulation Amount: Medium (34-66%) Exposed Structure Granulation Quality: Pink Fascia Exposed: No Necrotic Amount: Medium (34-66%) Fat Layer (Subcutaneous Tissue) Exposed: Yes Necrotic Quality: Adherent Slough Tendon Exposed: No Muscle Exposed: No Joint Exposed: No Bone Exposed: No Treatment Notes Wound #2 (Left Calcaneus) Notes desetin on glut, iodoflex on heel Electronic Signature(s) Signed: 09/09/2019 3:58:38 PM By: Harrie Jeans ([id number]) Entered By: Army Melia on 09/09/2019 11:30:47 [place]ys[place]i, [name] ([id number]) -------------------------------------------------------------------------------- Wound Assessment Details Patient Name: [name] Date of Service: 09/09/2019 11:15 AM Medical Record Number: [id number] Patient Account Number: [id number] Date of Birth/Sex: February 04, 1932 (86 y.o. M) Treating RN: Army Melia Primary Care Floyed Masoud: [name] Other Clinician: Referring Maxon Kresse: [name] Treating Zaion Hreha/Extender: Melburn Hake, HOYT Weeks in Treatment: 2 Wound Status Wound Number: 3 Primary Pressure Ulcer Etiology: Wound Location: Right Gluteus Wound Open Wounding Event: Shear/Friction Status: Date Acquired: 08/23/2019 Comorbid Cataracts, Hypertension, Peripheral Venous Disease, Type Weeks Of Treatment: 2 History: II Diabetes, End Stage Renal Disease Clustered Wound: No Photos Wound Measurements Length: (cm) 2 Width: (cm) 1 Depth: (cm) 0.1 Area: (cm) 1.571 Volume: (cm) 0.157 % Reduction in Area: 21.6% % Reduction in Volume: 21.5% Epithelialization: Medium (34-66%) Wound Description Classification: Category/Stage II F Wound Margin: Flat and  Intact S Exudate Amount: Medium Exudate Type: Serous Exudate Color: amber oul Odor After Cleansing: No lough/Fibrino No Wound Bed Granulation Amount: Medium (34-66%) Exposed Structure Granulation Quality: Pink Fascia Exposed: No Necrotic Amount: Medium (34-66%) Fat Layer (Subcutaneous Tissue) Exposed: Yes Necrotic Quality: Adherent Slough Tendon Exposed: No Muscle Exposed: No Joint Exposed: No Bone Exposed: No Treatment Notes Wound #3 (Right Gluteus) Notes desetin on glut, iodoflex on heel Electronic Signature(s) Signed: 09/09/2019 3:58:38 PM By: Harrie Jeans ([id number]) Entered By: Army Melia on 09/09/2019 11:31:08 Brandenburger, [name] ([id number]) -------------------------------------------------------------------------------- Vitals Details Patient Name: [name] Date of Service: 09/09/2019 11:15 AM Medical Record Number: [id number] Patient Account Number: [id number] Date of Birth/Sex: August 25, 1931 (86 y.o. M) Treating RN: Army Melia Primary Care Tayja Manzer: [name] Other Clinician: Referring Ambika Zettlemoyer: [name] Treating Bertice Risse/Extender: Melburn Hake, HOYT Weeks in Treatment: 2 Vital Signs Time Taken: 11:22 Temperature (F): 97.7 Height (in): 68 Pulse (bpm): 78 Weight (lbs): 145 Respiratory Rate (breaths/min): 16 Body Mass Index (BMI): 22 Blood Pressure (mmHg): 139/57 Reference Range: 80 - 120 mg / dl Electronic Signature(s) Signed: 09/09/2019 3:58:38 PM By: Army Melia Entered By: Army Melia on 09/09/2019 11:23:07

## 2019-09-10 ENCOUNTER — Encounter: Payer: Self-pay | Admitting: Nurse Practitioner

## 2019-09-10 ENCOUNTER — Non-Acute Institutional Stay: Payer: Medicare Other | Admitting: Nurse Practitioner

## 2019-09-10 DIAGNOSIS — Z515 Encounter for palliative care: Secondary | ICD-10-CM

## 2019-09-10 DIAGNOSIS — F0391 Unspecified dementia with behavioral disturbance: Secondary | ICD-10-CM

## 2019-09-10 NOTE — Progress Notes (Signed)
[facility] Consult Note Telephone: [phone]  Fax: [phone]  PATIENT NAME: [name] DOB: 02-10-1932 MRN: [id number]  PRIMARY CARE PROVIDER:   [name], MD  REFERRING PROVIDER:  [name], MD [address] [facility] [place],  [place] [address]  RESPONSIBLE PARTY:Daughter in [place] This visitand family meetingwas done face to face in person  RECOMMENDATIONS and PLAN: 1.ACP:DNR, in Epic/Vynca; will continue PTwith ongoing improvements, wishes are no dialysis but to treat what is treatable,  2.Palliative care encounter; Palliative medicine team will continue to support patient, patient's family, and medical team. Visit consisted of counseling and education dealing with the complex and emotionally intense issues of symptom management and palliative care in the setting of serious and potentially life-threatening illness I spent 70 minutes providing this consultation,  from 12:00pm to 1:10pm. More than 50% of the time in this consultation was spent coordinating communication.   HISTORY OF PRESENT ILLNESS:  [name] is a 86 y.o. year old male with multiple medical problems including Dementia, Cancer, iron deficiency anemia , peripheral artery disease, chronic kidney disease, diabetes, heart murmur, hypertension, hyperlipidemia, retinal detachment, polypectomy, right intramedullary nail, hernia repair, eye surgery. [name] continues to reside in assisted living facility in at [facility]. [name]  does walk with a walker and assistance. [name] continuing to work with there be trying to improve his strength imbalance. [name] has require assistance with ADLs, toileting. At times he does try to toilet himself for staff. [name] does feed himself after assistance with tray setup. Appetite has been improving. [name]  does continue to go to the wound care center for his foot ulcer. No new changes  since last palliative care visit two weeks ago. No recent falls, infections, hospitalizations. [name] did go to Nephrology and GFR 18 which has normalized for him. Staff endorses  [name] has been improving. At present [name] is sitting in the recliner in his room with his feet up. [name]  appears elderly, comfortable. No visitors present. I visited and observed [name]. We talked about purpose of palliative care visit. [name] smiled and said thank you. We talked about how he was feeling today. We talked about symptoms of pain and shortness of breath which he denies. We talked about his appetite which has been improving. We talked about his foot ulcer. [name] endorses he is not sure how that looks. We talked about as functional ability ambulating with the walker. We talked about fall risk. We talked about importance of asking staff for assistance. [name] was cooperative with assessment. Emotional support provided. We talked about role of palliative care. Discuss with [name] will follow up in 4 weeks if needed or sooner should he declined. [name] in agreement. I met separately with [name], [name]  daughter-in-law, son and daughter. Clinical update discuss. We talked about palliative care visit with [name]. We talked about symptoms. We talked about appetite which is improving. We talked about the appointment at the wound care center. Ulcer on his foot is slowly improving. We talked about Nephrology follow-up appointment with GFR 18. We talked about nepro supplement which was approved by Nephrology. We talked about [name] now opening up the facility to visitors. We talked about the importance of socialization come interacting. [name], [name] daughter-inlaw has planned a small garden with tomatoes outside the building where [name] can help  her attend to it. [name] endorses she feels like this will give him something to do as he used to love to garden. [name] endorses that they had a  huge garden for many years at their home. We talked about medical goals of care. We talked about treat what is treatable. We talked about role of palliative care and plan of care. Discuss that he has stabilized and doing very well considering how sick he had been with hospitalizations. [name] endorses which is already continue to work with therapy, improve strength as much as he is able. We talked about role a palliative care and plan of care. We talked about next follow-up appointment in 4 weeks if needed or sooner should he declined. Family in agreement. No new changes to current goals are plan of care. I have updated nursing staff.   Palliative Care was asked to help to continue to address goals of care.   CODE STATUS: DNR PPS: 40% HOSPICE ELIGIBILITY/DIAGNOSIS: TBD  PAST MEDICAL HISTORY:  Past Medical History:  Diagnosis Date  . Cancer ([place])   . Chronic kidney disease   . Colon polyp   . Diabetes mellitus without complication ([place])   . Heart murmur 2008  . Hyperlipidemia   . Hypertension 1980  . Retinal detachment     SOCIAL HX:  Social History   Tobacco Use  . Smoking status: Never Smoker  . Smokeless tobacco: Never Used  Substance Use Topics  . Alcohol use: No    ALLERGIES:  Allergies  Allergen Reactions  . Meperidine Other (See Comments)    Severe bradycardia Severe bradycardia   . Other     Darvocet-N Lowers heart rate  . Penicillins Rash     PERTINENT MEDICATIONS:  Outpatient Encounter Medications as of 09/10/2019  Medication Sig  . acetaminophen (TYLENOL) 500 MG tablet Take 500 mg by mouth every 12 (twelve) hours as needed for mild pain.  Marland Kitchen amLODipine (NORVASC) 10 MG tablet Take 10 mg by mouth daily.   Marland Kitchen aspirin EC 81 MG EC tablet Take 1 tablet (81 mg total) by mouth daily.  Marland Kitchen B-Complex TABS Take 1 tablet by mouth daily.  . calcitRIOL (ROCALTROL) 0.25 MCG capsule Take 0.25 mcg by mouth daily.   . calcium carbonate (OSCAL) 1500 (600 Ca) MG TABS tablet Take  600 mg of elemental calcium by mouth 2 (two) times daily.  . Cholecalciferol (VITAMIN D) 50 MCG (2000 UT) tablet Take 2,000 Units by mouth daily.  Marland Kitchen denosumab (PROLIA) 60 MG/ML SOSY injection Inject 60 mg into the skin every 6 (six) months.  . ferrous sulfate 325 (65 FE) MG tablet Take 325 mg by mouth daily with breakfast.  . furosemide (LASIX) 20 MG tablet Take 10-40 mg by mouth 2 (two) times daily. Take 2 tablets ('40mg'$ ) by mouth every morning and take  tablet ('10mg'$ ) by mouth at lunchtime  . JALYN 0.5-0.4 MG CAPS Take 1 capsule by mouth daily.   Marland Kitchen linagliptin (TRADJENTA) 5 MG TABS tablet Take 5 mg by mouth daily.  Marland Kitchen losartan (COZAAR) 25 MG tablet Take 0.5 tablets (12.5 mg total) by mouth 2 (two) times daily.  . Multiple Vitamin (MULTIVITAMIN) capsule Take 1 capsule by mouth daily.  . Nutritional Supplements (FEEDING SUPPLEMENT, NEPRO CARB STEADY,) LIQD Take 237 mLs by mouth 3 (three) times daily with meals.  . polyethylene glycol (MIRALAX / GLYCOLAX) 17 g packet Take 17 g by mouth daily.  . potassium chloride (KLOR-CON) 10 MEQ tablet Take 10 mEq by mouth  daily.  . saccharomyces boulardii (FLORASTOR) 250 MG capsule Take 250 mg by mouth 2 (two) times daily.  . sodium bicarbonate 650 MG tablet Take 650 mg by mouth 2 (two) times daily.  Marland Kitchen triamcinolone ointment (KENALOG) 0.1 % Apply 1 application topically 2 (two) times daily. (apply to arms and legs)   No facility-administered encounter medications on file as of 09/10/2019.    PHYSICAL EXAM:   General: NAD, frail appearing, thin pleasant male Cardiovascular: regular rate and rhythm Pulmonary: clear ant fields Abdomen: soft, nontender, + bowel sounds Extremities: mild BLE edema, no joint deformities Neurological: generalized weakness; walks with walker  Trevell Pariseau Ihor Gully, NP

## 2019-09-16 ENCOUNTER — Encounter: Payer: Medicare Other | Admitting: Physician Assistant

## 2019-09-16 ENCOUNTER — Other Ambulatory Visit: Payer: Self-pay

## 2019-09-16 DIAGNOSIS — E11621 Type 2 diabetes mellitus with foot ulcer: Secondary | ICD-10-CM | POA: Diagnosis not present

## 2019-09-16 NOTE — Progress Notes (Addendum)
ACEL, NATZKE ([id number]) Visit Report for 09/16/2019 Chief Complaint Document Details Patient Name: [name], [name] Date of Service: 09/16/2019 8:45 AM Medical Record Number: [id number] Patient Account Number: [id number] Date of Birth/Sex: [birth year] (86 y.o. M) Treating RN: Army Melia Primary Care Provider: [name] Other Clinician: Referring Provider: [name] Treating Provider/Extender: Melburn Hake, Jawanna Dykman Weeks in Treatment: 3 Information Obtained from: Patient Chief Complaint Left heel and right gluteal ulcer Electronic Signature(s) Signed: 09/16/2019 8:45:00 AM By: Worthy Keeler PA-C Entered By: Worthy Keeler on 09/16/2019 08:45:00 [place], [name] ([id number]) -------------------------------------------------------------------------------- Debridement Details Patient Name: [name] Date of Service: 09/16/2019 8:45 AM Medical Record Number: [id number] Patient Account Number: [id number] Date of Birth/Sex: [birth year] (86 y.o. M) Treating RN: Army Melia Primary Care Provider: [name] Other Clinician: Referring Provider: [name] Treating Provider/Extender: Melburn Hake, Taraneh Metheney Weeks in Treatment: 3 Debridement Performed for Wound #2 Left Calcaneus Assessment: Performed By: Physician STONE III, Venise Ellingwood E., PA-C Debridement Type: Debridement Level of Consciousness (Pre- Awake and Alert procedure): Pre-procedure Verification/Time Out Yes - 08:58 Taken: Start Time: 08:59 Pain Control: Lidocaine Total Area Debrided (L x W): 1 (cm) x 1.2 (cm) = 1.2 (cm) Tissue and other material debrided: Viable, Non-Viable, Callus, Slough, Subcutaneous, Slough Level: Skin/Subcutaneous Tissue Debridement Description: Excisional Instrument: Curette Bleeding: Minimum Hemostasis Achieved: Pressure End Time: 09:00 Response to Treatment: Procedure was tolerated well Level of Consciousness (Post- Awake and Alert procedure): Post Debridement Measurements of Total  Wound Length: (cm) 1 Stage: Category/Stage III Width: (cm) 1.2 Depth: (cm) 0.3 Volume: (cm) 0.283 Character of Wound/Ulcer Post Debridement: Stable Post Procedure Diagnosis Same as Pre-procedure Electronic Signature(s) Signed: 09/16/2019 1:30:06 PM By: Army Melia Signed: 09/17/2019 10:31:16 AM By: Worthy Keeler PA-C Entered By: Army Melia on 09/16/2019 08:59:37 [place], [name] ([id number]) -------------------------------------------------------------------------------- HPI Details Patient Name: [name] Date of Service: 09/16/2019 8:45 AM Medical Record Number: [id number] Patient Account Number: [id number] Date of Birth/Sex: [birth year] (86 y.o. M) Treating RN: Army Melia Primary Care Provider: [name] Other Clinician: Referring Provider: [name] Treating Provider/Extender: Melburn Hake, Keigo Whalley Weeks in Treatment: 3 History of Present Illness HPI Description: 05/19/18 on evaluation today patient presents for initial evaluation and clinic concerning issues that he has been having with his right medial lower leg ulcer. The patient has actually been seen in the emergency department for cellulitis he was admitted at [facility] on 04/19/18 discharged 04/21/18. He was noted to have proof of vascular disease and did undergo an angiogram on 04/20/18 by Dr. Leotis Pain. 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 Subsequently Dr. dew following intervention noted that the patient had depending on the location 20-40% residual stenosis noted and significant improvement in the overall vascular flow. Obviously this is great news. With that being said the patient's wound he states does seem to be doing some better compared to previous. Upon inspection indeed it appears that he has some epithelialization. Overall I do not see any evidence of significant infection which is  great news. No fevers chills noted 05/26/18 on evaluation today patient's wound actually appears to be doing much better at this time. With that being said I do think the [facility] Dressing a be sticking according to my nurse and this may be causing some issues as well. I'm gonna suggest at this point that we likely add mepitel underneath the [facility] Dressing help  prevent this. Other than that everything seems to be doing excellent. 06/02/18 on evaluation today patient actually appears to be doing decently well in regard to his right lower extremity ulcer. This seems to show signs of good improvement which is excellent news. With that being said he did see vascular the good news is no further intervention is recommended nor necessary at this point. Actually placed in an [employer] and sent orders to home health which in the end it led to her switching the patient from the three layer compression wrap of the right lower extremity to an [employer] wrap. I feel like he did better in the three layer compression. With that being said the patient currently shows no signs of worsening the mepitel did help prevent any sticking to the wound bed which is great news. 06/12/18 an evaluation today patient presents for follow-up concerning his right lower extremity ulceration. The medial aspect ulcer appears to be doing fairly well this time which is good news. He has a blister on the lateral aspect although I'm concerned this may actually be due to the fact that his wrap is sliding down. We seem to be having issues with getting this wrapped appropriately were it will stay in place. Fortunately there does not appear to be any signs of infection at this time which is good news. He is seen with his son present at this point during the visit. 06/16/18 on evaluation today patient appears to be doing rather well and in fact appears to be almost completely healed in regard to his lower extremity ulcer on the right.  The wrap much better this week with [place] wrapping it and just keeping him in that same wrap until follow-up. For that reason we will discontinue home health services. Fortunately there does not appear to be the evidence of infection at this point. 06/23/18 on evaluation today patient appears to be doing very well in regard to his ulcer which in fact appears to be completely healed. He has been tolerating the wraps without complication although he is definitely ready to be out of these considering how well things have done. Fortunately there is not appear to be any signs of infection at this time which is great news. 08/26/2019 upon evaluation today patient appears to be doing somewhat poorly upon initial inspection here in our clinic for his current issue. This is a patient that I previously taken care of with regard to wounds on his legs. With that being said the issues that were having right now are actually separate from that he has a heel ulcer unfortunately and then subsequently also does have a wound in the right gluteal region that appears to be more of a skin tear. The patient does have a history of diabetes mellitus type 2, peripheral vascular disease, hypertension, and chronic kidney disease stage IV. He is also somewhat weak though he does like to walk around which is actually a good thing he does so slowly. He is currently at an assisted living facility at this point. 09/02/2019 upon evaluation today patient appears to be doing well with regard to the wound on his left heel as well as the right gluteal region. He has had the arterial studies I did look up the numbers and it appears that his TBI's were actually pretty good bilaterally measuring about 1 on the left and around 0.64 on the right. With that being said the final report is not here yet we should have that by next  week. Nonetheless looking at the results of the imaging I feel like he has good arterial flow to be able to proceed with  debridement today especially on this left heel. 09/09/2019 upon evaluation today patient's wounds actually appear to be showing signs of improvement. In the gluteal/sacral region he is showing signs of significant improvement and in fact I really do not see much that is exactly open although there is a lot of moisture type breakdown that I do see at this point. Nonetheless I believe that the patient can potentially transition from a dressing to just using a barrier cream in this area. In regard to the heel that it does appear to be showing signs of improvement I think the Iodoflex is doing a good job. 09/16/2019 upon evaluation today patient appears to be doing well with regard to his heel ulcer. He has been tolerating the dressing changes without complication. Fortunately there is no signs of active infection at this point which is great news. There is some question about physical therapy whether he should be cutting back on this or not. With that being said it does not appear its causing any damage to his heel I think he can actually continue with therapy at this point. Electronic Signature(s) Signed: 09/16/2019 9:18:20 AM By: Carla Drape, [place]. ([id number]) Entered By: Worthy Keeler on 09/16/2019 09:18:20 [name], [name] ([id number]) -------------------------------------------------------------------------------- Physical Exam Details Patient Name: [name] Date of Service: 09/16/2019 8:45 AM Medical Record Number: [id number] Patient Account Number: [id number] Date of Birth/Sex: Feb 10, 1932 (87 y.o. M) Treating RN: Army Melia Primary Care Provider: [name] Other Clinician: Referring Provider: [name] Treating Provider/Extender: Melburn Hake, Demica Zook Weeks in Treatment: 3 Constitutional Well-nourished and well-hydrated in no acute distress. Respiratory normal breathing without difficulty. Psychiatric this patient is able to make decisions and demonstrates good  insight into disease process. Alert and Oriented x 3. pleasant and cooperative. Notes Upon inspection today patient's wound bed actually showed signs of good granulation currently there does not appear to be any evidence of active infection which is great news. Overall I do not see anything that seems to indicate physical therapy has been harming the progress of this wound in fact I feel like he is making excellent progress. Electronic Signature(s) Signed: 09/16/2019 9:20:56 AM By: Worthy Keeler PA-C Entered By: Worthy Keeler on 09/16/2019 09:20:56 [name], [name] ([id number]) -------------------------------------------------------------------------------- Physician Orders Details Patient Name: [name] Date of Service: 09/16/2019 8:45 AM Medical Record Number: [id number] Patient Account Number: [id number] Date of Birth/Sex: 1932-01-27 (87 y.o. M) Treating RN: Army Melia Primary Care Provider: [name] Other Clinician: Referring Provider: [name] Treating Provider/Extender: Melburn Hake, Siearra Amberg Weeks in Treatment: 3 Verbal / Phone Orders: No Diagnosis Coding ICD-10 Coding Code Description E11.621 Type 2 diabetes mellitus with foot ulcer L89.620 Pressure ulcer of left heel, unstageable S31.819A Unspecified open wound of right buttock, initial encounter I73.89 Other specified peripheral vascular diseases I10 Essential (primary) hypertension N18.4 Chronic kidney disease, stage 4 (severe) Wound Cleansing Wound #2 Left Calcaneus o May Shower, gently pat wound dry prior to applying new dressing. Anesthetic (add to Medication List) Wound #2 Left Calcaneus o Topical Lidocaine 4% cream applied to wound bed prior to debridement (In Clinic Only). - in clinic only Primary Wound Dressing Wound #2 Left Calcaneus o Iodoflex Secondary Dressing Wound #2 Left Calcaneus o Boardered Foam Dressing Dressing Change Frequency Wound #2 Left Calcaneus o Change Dressing Monday,  Wednesday, Friday Follow-up Appointments  Wound #2 Left Calcaneus o Return Appointment in 1 week. Edema Control Wound #2 Left Calcaneus o Elevate legs to the level of the heart and pump ankles as often as possible Off-Loading Wound #2 Left Calcaneus o Turn and reposition every 2 hours Home Health Wound #2 Left [place] [facility] o Home Health Nurse may visit PRN to address patientos wound care needs. o FACE TO FACE ENCOUNTER: MEDICARE and MEDICAID PATIENTS: I certify that this patient is under my care and that I had a face-to- face encounter that meets the physician face-to-face encounter requirements with this patient on this date. The encounter with the patient was in whole or in part for the following MEDICAL CONDITION: (primary reason for [place]) MEDICAL NECESSITY: [name], [name] ([id number]) I certify, that based on my findings, NURSING services are a medically necessary home health service. HOME BOUND STATUS: I certify that my clinical findings support that this patient is homebound (i.e., Due to illness or injury, pt requires aid of supportive devices such as crutches, cane, wheelchairs, walkers, the use of special transportation or the assistance of another person to leave their place of residence. There is a normal inability to leave the home and doing so requires considerable and taxing effort. Other absences are for medical reasons / religious services and are infrequent or of short duration when for other reasons). o If current dressing causes regression in wound condition, may D/C ordered dressing product/s and apply Normal Saline Moist Dressing daily until next [place] / Other MD appointment. [place] of regression in wound condition at [phone]. o Please direct any NON-WOUND related issues/requests for orders to patient's Primary Care Physician Electronic Signature(s) Signed:  09/16/2019 1:30:06 PM By: Army Melia Signed: 09/17/2019 10:31:16 AM By: Worthy Keeler PA-C Entered By: Army Melia on 09/16/2019 09:00:34 [name], [name] ([id number]) -------------------------------------------------------------------------------- Problem List Details Patient Name: [name] Date of Service: 09/16/2019 8:45 AM Medical Record Number: [id number] Patient Account Number: [id number] Date of Birth/Sex: [birth year] (86 y.o. M) Treating RN: Army Melia Primary Care Provider: [name] Other Clinician: Referring Provider: [name] Treating Provider/Extender: Melburn Hake, Sharone Picchi Weeks in Treatment: 3 Active Problems ICD-10 Evaluated Encounter Code Description Active Date Today Diagnosis E11.621 Type 2 diabetes mellitus with foot ulcer 08/26/2019 No Yes L89.620 Pressure ulcer of left heel, unstageable 08/26/2019 No Yes S31.819A Unspecified open wound of right buttock, initial encounter 08/26/2019 No Yes I73.89 Other specified peripheral vascular diseases 08/26/2019 No Yes I10 Essential (primary) hypertension 08/26/2019 No Yes N18.4 Chronic kidney disease, stage 4 (severe) 08/26/2019 No Yes Inactive Problems Resolved Problems Electronic Signature(s) Signed: 09/16/2019 8:44:53 AM By: Worthy Keeler PA-C Entered By: Worthy Keeler on 09/16/2019 08:44:52 [place], [name] ([id number]) -------------------------------------------------------------------------------- Progress Note Details Patient Name: [name] Date of Service: 09/16/2019 8:45 AM Medical Record Number: [id number] Patient Account Number: [id number] Date of Birth/Sex: March 03, 1932 (87 y.o. M) Treating RN: Army Melia Primary Care Provider: [name] Other Clinician: Referring Provider: [name] Treating Provider/Extender: Melburn Hake, Antaniya Venuti Weeks in Treatment: 3 Subjective Chief Complaint Information obtained from Patient Left heel and right gluteal ulcer History of Present Illness (HPI) 05/19/18 on  evaluation today patient presents for initial evaluation and clinic concerning issues that he has been having with his right medial lower leg ulcer. The patient has actually been seen in the emergency department for cellulitis he was admitted at [facility] on 04/19/18 discharged 04/21/18. He was noted to have proof  of vascular disease and did undergo an angiogram on 04/20/18 by Dr. Leotis Pain. 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 Subsequently Dr. dew following intervention noted that the patient had depending on the location 20-40% residual stenosis noted and significant improvement in the overall vascular flow. Obviously this is great news. With that being said the patient's wound he states does seem to be doing some better compared to previous. Upon inspection indeed it appears that he has some epithelialization. Overall I do not see any evidence of significant infection which is great news. No fevers chills noted 05/26/18 on evaluation today patient's wound actually appears to be doing much better at this time. With that being said I do think the [facility] Dressing a be sticking according to my nurse and this may be causing some issues as well. I'm gonna suggest at this point that we likely add mepitel underneath the [facility] Dressing help prevent this. Other than that everything seems to be doing excellent. 06/02/18 on evaluation today patient actually appears to be doing decently well in regard to his right lower extremity ulcer. This seems to show signs of good improvement which is excellent news. With that being said he did see vascular the good news is no further intervention is recommended nor necessary at this point. Actually placed in an [employer] and sent orders to home health which in the end it led to her switching the patient from the three layer compression wrap  of the right lower extremity to an [employer] wrap. I feel like he did better in the three layer compression. With that being said the patient currently shows no signs of worsening the mepitel did help prevent any sticking to the wound bed which is great news. 06/12/18 an evaluation today patient presents for follow-up concerning his right lower extremity ulceration. The medial aspect ulcer appears to be doing fairly well this time which is good news. He has a blister on the lateral aspect although I'm concerned this may actually be due to the fact that his wrap is sliding down. We seem to be having issues with getting this wrapped appropriately were it will stay in place. Fortunately there does not appear to be any signs of infection at this time which is good news. He is seen with his son present at this point during the visit. 06/16/18 on evaluation today patient appears to be doing rather well and in fact appears to be almost completely healed in regard to his lower extremity ulcer on the right. The wrap much better this week with [place] wrapping it and just keeping him in that same wrap until follow-up. For that reason we will discontinue home health services. Fortunately there does not appear to be the evidence of infection at this point. 06/23/18 on evaluation today patient appears to be doing very well in regard to his ulcer which in fact appears to be completely healed. He has been tolerating the wraps without complication although he is definitely ready to be out of these considering how well things have done. Fortunately there is not appear to be any signs of infection at this time which is great news. 08/26/2019 upon evaluation today patient appears to be doing somewhat poorly upon initial inspection here in our clinic for his current issue. This is a patient that I previously taken care of with regard to wounds  on his legs. With that being said the issues that were having right now are  actually separate from that he has a heel ulcer unfortunately and then subsequently also does have a wound in the right gluteal region that appears to be more of a skin tear. The patient does have a history of diabetes mellitus type 2, peripheral vascular disease, hypertension, and chronic kidney disease stage IV. He is also somewhat weak though he does like to walk around which is actually a good thing he does so slowly. He is currently at an assisted living facility at this point. 09/02/2019 upon evaluation today patient appears to be doing well with regard to the wound on his left heel as well as the right gluteal region. He has had the arterial studies I did look up the numbers and it appears that his TBI's were actually pretty good bilaterally measuring about 1 on the left and around 0.64 on the right. With that being said the final report is not here yet we should have that by next week. Nonetheless looking at the results of the imaging I feel like he has good arterial flow to be able to proceed with debridement today especially on this left heel. 09/09/2019 upon evaluation today patient's wounds actually appear to be showing signs of improvement. In the gluteal/sacral region he is showing signs of significant improvement and in fact I really do not see much that is exactly open although there is a lot of moisture type breakdown that I do see at this point. Nonetheless I believe that the patient can potentially transition from a dressing to just using a barrier cream in this area. In regard to the heel that it does appear to be showing signs of improvement I think the Iodoflex is doing a good job. 09/16/2019 upon evaluation today patient appears to be doing well with regard to his heel ulcer. He has been tolerating the dressing changes without complication. Fortunately there is no signs of active infection at this point which is great news. There is some question about physical therapy whether he  should be cutting back on this or not. With that being said it does not appear its causing any damage to his heel I think he can actually continue with therapy at this point. [name], [name] ([id number]) Objective Constitutional Well-nourished and well-hydrated in no acute distress. Vitals Time Taken: 8:40 AM, Height: 68 in, Weight: 145 lbs, BMI: 22, Temperature: 98.3 F, Pulse: 82 bpm, Respiratory Rate: 16 breaths/min, Blood Pressure: 141/64 mmHg. Respiratory normal breathing without difficulty. Psychiatric this patient is able to make decisions and demonstrates good insight into disease process. Alert and Oriented x 3. pleasant and cooperative. General Notes: Upon inspection today patient's wound bed actually showed signs of good granulation currently there does not appear to be any evidence of active infection which is great news. Overall I do not see anything that seems to indicate physical therapy has been harming the progress of this wound in fact I feel like he is making excellent progress. Integumentary (Hair, Skin) Wound #2 status is Open. Original cause of wound was Gradually Appeared. The wound is located on the Left Calcaneus. The wound measures 1cm length x 1.2cm width x 0.3cm depth; 0.942cm^2 area and 0.283cm^3 volume. There is Fat Layer (Subcutaneous Tissue) Exposed exposed. There is no tunneling or undermining noted. There is a medium amount of serous drainage noted. The wound margin is flat and intact. There is medium (34- 66%) pink granulation within the  wound bed. There is a medium (34-66%) amount of necrotic tissue within the wound bed including Adherent Slough. Wound #3 status is Healed - Epithelialized. Original cause of wound was Shear/Friction. The wound is located on the Right Gluteus. The wound measures 0cm length x 0cm width x 0cm depth; 0cm^2 area and 0cm^3 volume. The wound is limited to skin breakdown. There is no tunneling or undermining noted. There is a none  present amount of drainage noted. The wound margin is flat and intact. There is no granulation within the wound bed. There is no necrotic tissue within the wound bed. 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 There was a Excisional Skin/Subcutaneous Tissue Debridement with a total area of 1.2 sq cm performed by STONE III, Miliyah Luper E., PA-C. With the following instrument(s): Curette to remove Viable and Non-Viable tissue/material. Material removed includes Callus, Subcutaneous Tissue, and Slough after achieving pain control using Lidocaine. A time out was conducted at 08:58, prior to the start of the procedure. A Minimum amount of bleeding was controlled with Pressure. The procedure was tolerated well. Post Debridement Measurements: 1cm length x 1.2cm width x 0.3cm depth; 0.283cm^3 volume. Post debridement Stage noted as Category/Stage III. [name], [name] ([id number]) Character of Wound/Ulcer Post Debridement is stable. Post procedure Diagnosis Wound #2: Same as Pre-Procedure Plan Wound Cleansing: Wound #2 Left Calcaneus: May Shower, gently pat wound dry prior to applying new dressing. Anesthetic (add to Medication List): Wound #2 Left Calcaneus: Topical Lidocaine 4% cream applied to wound bed prior to debridement (In Clinic Only). - in clinic only Primary Wound Dressing: Wound #2 Left Calcaneus: Iodoflex Secondary Dressing: Wound #2 Left Calcaneus: Boardered Foam Dressing Dressing Change Frequency: Wound #2 Left Calcaneus: Change Dressing Monday, Wednesday, Friday Follow-up Appointments: Wound #2 Left Calcaneus: Return  Appointment in 1 week. Edema Control: Wound #2 Left Calcaneus: Elevate legs to the level of the heart and pump ankles as often as possible Off-Loading: Wound #2 Left Calcaneus: Turn and reposition every 2 hours Home Health: Wound #2 Left Calcaneus: Continue Home Health Visits - [facility] Nurse may visit PRN to address patient s wound care needs. FACE TO FACE ENCOUNTER: MEDICARE and MEDICAID PATIENTS: I certify that this patient is under my care and that I had a face-to-face encounter that meets the physician face-to-face encounter requirements with this patient on this date. The encounter with the patient was in whole or in part for the following MEDICAL CONDITION: (primary reason for [place]) MEDICAL NECESSITY: I certify, that based on my findings, NURSING services are a medically necessary home health service. HOME BOUND STATUS: I certify that my clinical findings support that this patient is homebound (i.e., Due to illness or injury, pt requires aid of supportive devices such as crutches, cane, wheelchairs, walkers, the use of special transportation or the assistance of another person to leave their place of residence. There is a normal inability to leave the home and doing so requires considerable and taxing effort. Other absences are for medical reasons / religious services and are infrequent or of short duration when for other reasons). If current dressing causes regression in wound condition, may D/C ordered dressing product/s and apply Normal Saline Moist Dressing daily  until next [place] / Other MD appointment. Edgar of regression in wound condition at [phone]. Please direct any NON-WOUND related issues/requests for orders to patient's Primary Care Physician 1. I would recommend currently that we go ahead and continue with the wound care measures as before with regard to utilizing the Iodoflex in the heel ulcer. He is in  agreement with that plan. 2. The region in his sacral area has completely healed and there is no signs of breakdown or opening at this point. 3. I am also can recommend continued and appropriate offloading I think that is good along with the fact that he also needs to be continue to be active being that this wound is on the posterior portion of the heel and not on the plantar aspect of his foot I do not believe physical therapy is going to affect things nor walking. Although if anything becomes too excessive or I see any detriment to the wound that I would definitely let the patient and the family know. We will see patient back for reevaluation in 1 week here in the clinic. If anything worsens or changes patient will contact our office for additional recommendations. Electronic Signature(s) Signed: 09/16/2019 9:21:56 AM By: Worthy Keeler PA-C Entered By: Worthy Keeler on 09/16/2019 09:21:55 JOSIE, MESA ([id number], [name] ([id number]) -------------------------------------------------------------------------------- SuperBill Details Patient Name: [name] Date of Service: 09/16/2019 Medical Record Number: [id number] Patient Account Number: [id number] Date of Birth/Sex: 09/24/1931 (87 y.o. 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, unstageable S31.819A Unspecified open wound of right buttock, initial encounter I73.89 Other specified peripheral vascular diseases I10 Essential (primary) hypertension N18.4 Chronic kidney disease, stage 4 (severe) Facility Procedures CPT4 Code: 69794801 Description: 65537 - DEB SUBQ TISSUE 20 SQ CM/< Modifier: Quantity: 1 CPT4 Code: Description: ICD-10 Diagnosis  Description 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

## 2019-09-17 NOTE — Progress Notes (Signed)
[name], [name] ([id number]) Visit Report for 09/16/2019 Arrival Information Details Patient Name: [name], [name] Date of Service: 09/16/2019 8:45 AM Medical Record Number: [id number] Patient Account Number: [id number] Date of Birth/Sex: 1931-08-08 (86 y.o. M) Treating RN: Montey Hora Primary Care Embry Huss: [name] Other Clinician: Referring Edwen Mclester: [name] Treating Chrystine Frogge/Extender: Melburn Hake, HOYT Weeks in Treatment: 3 Visit Information History Since Last Visit Added or deleted any medications: No Patient Arrived: Walker Any new allergies or adverse reactions: No Arrival Time: 08:38 Had a fall or experienced change in No Accompanied By: daughter activities of daily living that may affect Transfer Assistance: None risk of falls: Patient Identification Verified: Yes Signs or symptoms of abuse/neglect since last visito No Secondary Verification Process Completed: Yes Hospitalized since last visit: No Implantable device outside of the clinic excluding No cellular tissue based products placed in the center since last visit: Has Dressing in Place as Prescribed: Yes Pain Present Now: Yes Electronic Signature(s) Signed: 09/16/2019 4:13:45 PM By: Montey Hora Entered By: Montey Hora on 09/16/2019 08:40:23 Cambria, [name] ([id number]) -------------------------------------------------------------------------------- Encounter Discharge Information Details Patient Name: [name] Date of Service: 09/16/2019 8:45 AM Medical Record Number: [id number] Patient Account Number: [id number] Date of Birth/Sex: [birth year] (86 y.o. M) Treating RN: Army Melia Primary Care Jaslin Novitski: [name] Other Clinician: Referring Asencion Loveday: [name] Treating Darrie Macmillan/Extender: Melburn Hake, HOYT Weeks in Treatment: 3 Encounter Discharge Information Items Post Procedure Vitals Discharge Condition: Stable Temperature (F): 98.3 Ambulatory Status: Walker Pulse (bpm): 82 Discharge  Destination: Home Respiratory Rate (breaths/min): 16 Transportation: Private Auto Blood Pressure (mmHg): 142/64 Accompanied By: daughter Schedule Follow-up Appointment: Yes Clinical Summary of Care: Electronic Signature(s) Signed: 09/16/2019 1:30:06 PM By: Army Melia Entered By: Army Melia on 09/16/2019 09:04:15 Iser, [name] ([id number]) -------------------------------------------------------------------------------- Lower Extremity Assessment Details Patient Name: [name] Date of Service: 09/16/2019 8:45 AM Medical Record Number: [id number] Patient Account Number: [id number] Date of Birth/Sex: [birth year] (86 y.o. M) Treating RN: Montey Hora Primary Care Tylon Kemmerling: [name] Other Clinician: Referring Aarya Robinson: [name] Treating Lenora Gomes/Extender: STONE III, HOYT Weeks in Treatment: 3 Edema Assessment Assessed: [Left: No] [Right: No] Edema: [Left: N] [Right: o] Vascular Assessment Pulses: Dorsalis Pedis Palpable: [Left:Yes] Electronic Signature(s) Signed: 09/16/2019 4:13:45 PM By: Montey Hora Entered By: Montey Hora on 09/16/2019 08:49:37 Ladd, Pavel Alfonso Patten ([id number]) -------------------------------------------------------------------------------- Multi Wound Chart Details Patient Name: [name] Date of Service: 09/16/2019 8:45 AM Medical Record Number: [id number] Patient Account Number: [id number] Date of Birth/Sex: 17-Mar-1932 (86 y.o. M) Treating RN: Army Melia Primary Care Dandria Griego: [name] Other Clinician: Referring Sindia Kowalczyk: [name] Treating Ary Lavine/Extender: Melburn Hake, HOYT Weeks in Treatment: 3 Vital Signs Height(in): 41 Pulse(bpm): 58 Weight(lbs): 145 Blood Pressure(mmHg): 141/64 Body Mass Index(BMI): 22 Temperature(F): 98.3 Respiratory Rate(breaths/min): 16 Photos: [N/A:N/A] Wound Location: Left Calcaneus Right Gluteus N/A Wounding Event: Gradually Appeared Shear/Friction N/A Primary Etiology: Pressure Ulcer  Pressure Ulcer N/A Comorbid History: Cataracts, Hypertension, Peripheral Cataracts, Hypertension, Peripheral N/A Venous Disease, Type II Diabetes, Venous Disease, Type II Diabetes, End Stage Renal Disease End Stage Renal Disease Date Acquired: 07/26/2019 08/23/2019 N/A Weeks of Treatment: 3 3 N/A Wound Status: Open Healed - Epithelialized N/A Measurements L x W x D (cm) 1x1.2x0.3 0x0x0 N/A Area (cm) : 0.942 0 N/A Volume (cm) : 0.283 0 N/A % Reduction in Area: 34.10% 100.00% N/A % Reduction in Volume: -97.90% 100.00% N/A Classification: Category/Stage III Category/Stage II N/A Exudate Amount: Medium None Present N/A Exudate Type: Serous N/A N/A Exudate Color: amber N/A N/A Wound Margin:  Flat and Intact Flat and Intact N/A Granulation Amount: Medium (34-66%) None Present (0%) N/A Granulation Quality: Pink N/A N/A Necrotic Amount: Medium (34-66%) None Present (0%) N/A Exposed Structures: Fat Layer (Subcutaneous Tissue) Fascia: No N/A Exposed: Yes Fat Layer (Subcutaneous Tissue) Fascia: No Exposed: No Tendon: No Tendon: No Muscle: No Muscle: No Joint: No Joint: No Bone: No Bone: No Limited to Skin Breakdown Epithelialization: None Large (67-100%) N/A Treatment Notes Electronic Signature(s) Signed: 09/16/2019 1:30:06 PM By: Harrie Jeans ([id number]) Entered By: Army Melia on 09/16/2019 08:55:53 Mauger, [name] ([id number]) -------------------------------------------------------------------------------- [place] Details Patient Name: [name] Date of Service: 09/16/2019 8:45 AM Medical Record Number: [id number] Patient Account Number: [id number] Date of Birth/Sex: [birth year] (86 y.o. M) Treating RN: Army Melia Primary Care Catalaya Garr: [name] Other Clinician: Referring Angelea Penny: [name] Treating Tanicka Bisaillon/Extender: Melburn Hake, HOYT Weeks in Treatment: 3 Active Inactive Abuse / Safety / Falls / Self Care Management Nursing  Diagnoses: Impaired physical mobility Goals: Patient/caregiver will verbalize understanding of skin care regimen Date Initiated: 08/26/2019 Target Resolution Date: 09/08/2019 Goal Status: Active Interventions: Assess fall risk on admission and as needed Notes: Orientation to the Wound Care Program Nursing Diagnoses: Knowledge deficit related to the wound healing center program Goals: Patient/caregiver will verbalize understanding of the [place] Program Date Initiated: 08/26/2019 Target Resolution Date: 09/08/2019 Goal Status: Active Interventions: Provide education on orientation to the wound center Notes: Pressure Nursing Diagnoses: Knowledge deficit related to causes and risk factors for pressure ulcer development Goals: Patient will remain free from development of additional pressure ulcers Date Initiated: 08/26/2019 Target Resolution Date: 09/08/2019 Goal Status: Active Interventions: Assess: immobility, friction, shearing, incontinence upon admission and as needed Notes: Wound/Skin Impairment Nursing Diagnoses: Impaired tissue integrity Buxton, Olen R. ([id number]) Goals: Ulcer/skin breakdown will have a volume reduction of 30% by week 4 Date Initiated: 08/26/2019 Target Resolution Date: 09/26/2019 Goal Status: Active Interventions: Assess patient/caregiver ability to obtain necessary supplies Treatment Activities: Skin care regimen initiated : 08/26/2019 Notes: Electronic Signature(s) Signed: 09/16/2019 1:30:06 PM By: Army Melia Entered By: Army Melia on 09/16/2019 08:55:44 Eggert, [name] ([id number]) -------------------------------------------------------------------------------- Pain Assessment Details Patient Name: [name] Date of Service: 09/16/2019 8:45 AM Medical Record Number: [id number] Patient Account Number: [id number] Date of Birth/Sex: 12/03/1931 (86 y.o. M) Treating RN: Montey Hora Primary Care Shanya Ferriss: [name] Other  Clinician: Referring Maverick Patman: [name] Treating Jewel Mcafee/Extender: Melburn Hake, HOYT Weeks in Treatment: 3 Active Problems Location of Pain Severity and Description of Pain Patient Has Paino Yes Site Locations Pain Location: Pain in Ulcers With Dressing Change: Yes Duration of the Pain. Constant / Intermittento Intermittent Pain Management and Medication Current Pain Management: Notes hurts with dressing changes Electronic Signature(s) Signed: 09/16/2019 4:13:45 PM By: Montey Hora Entered By: Montey Hora on 09/16/2019 08:40:58 Noyes, [name] ([id number]) -------------------------------------------------------------------------------- Patient/Caregiver Education Details Patient Name: [name] Date of Service: 09/16/2019 8:45 AM Medical Record Number: [id number] Patient Account Number: [id number] Date of Birth/Gender: Nov 04, 1931 (87 y.o. M) Treating RN: Army Melia Primary Care Physician: [name] Other Clinician: Referring Physician: [name] Treating Physician/Extender: Sharalyn Ink in Treatment: 3 Education Assessment Education Provided To: Patient Education Topics Provided Wound/Skin Impairment: Handouts: Caring for Your Ulcer Methods: Demonstration, Explain/Verbal Responses: State content correctly Electronic Signature(s) Signed: 09/16/2019 1:30:06 PM By: Army Melia Entered By: Army Melia on 09/16/2019 09:00:55 Lonon, [name] ([id number]) -------------------------------------------------------------------------------- Wound Assessment Details Patient Name: [name] Date of Service: 09/16/2019 8:45 AM Medical Record  Number: [id number] Patient Account Number: [id number] Date of Birth/Sex: December 08, 1931 (86 y.o. M) Treating RN: Montey Hora Primary Care Garron Eline: [name] Other Clinician: Referring Emmry Hinsch: [name] Treating Jylian Pappalardo/Extender: Melburn Hake, HOYT Weeks in Treatment: 3 Wound Status Wound Number: 2 Primary  Pressure Ulcer Etiology: Wound Location: Left Calcaneus Wound Open Wounding Event: Gradually Appeared Status: Date Acquired: 07/26/2019 Comorbid Cataracts, Hypertension, Peripheral Venous Disease, Type Weeks Of Treatment: 3 History: II Diabetes, End Stage Renal Disease Clustered Wound: No Photos Wound Measurements Length: (cm) 1 % R Width: (cm) 1.2 % R Depth: (cm) 0.3 Epi Area: (cm) 0.942 Tu Volume: (cm) 0.283 Un eduction in Area: 34.1% eduction in Volume: -97.9% thelialization: None nneling: No dermining: No Wound Description Classification: Category/Stage III Fo Wound Margin: Flat and Intact Sl Exudate Amount: Medium Exudate Type: Serous Exudate Color: amber ul Odor After Cleansing: No ough/Fibrino Yes Wound Bed Granulation Amount: Medium (34-66%) Exposed Structure Granulation Quality: Pink Fascia Exposed: No Necrotic Amount: Medium (34-66%) Fat Layer (Subcutaneous Tissue) Exposed: Yes Necrotic Quality: Adherent Slough Tendon Exposed: No Muscle Exposed: No Joint Exposed: No Bone Exposed: No Treatment Notes Wound #2 (Left Calcaneus) Notes iodoflex on heel with BFD Electronic Signature(s) Signed: 09/16/2019 4:13:45 PM By: Barrett Henle, [name] ([id number]) Entered By: Montey Hora on 09/16/2019 08:47:59 Crilly, [name] ([id number]) -------------------------------------------------------------------------------- Wound Assessment Details Patient Name: [name] Date of Service: 09/16/2019 8:45 AM Medical Record Number: [id number] Patient Account Number: [id number] Date of Birth/Sex: 05/01/1932 (86 y.o. M) Treating RN: Montey Hora Primary Care Emmersen Garraway: [name] Other Clinician: Referring Arath Kaigler: [name] Treating Itamar Mcgowan/Extender: Melburn Hake, HOYT Weeks in Treatment: 3 Wound Status Wound Number: 3 Primary Pressure Ulcer Etiology: Wound Location: Right Gluteus Wound Healed - Epithelialized Wounding Event:  Shear/Friction Status: Date Acquired: 08/23/2019 Comorbid Cataracts, Hypertension, Peripheral Venous Disease, Type Weeks Of Treatment: 3 History: II Diabetes, End Stage Renal Disease Clustered Wound: No Photos Wound Measurements Length: (cm) Width: (cm) Depth: (cm) Area: (cm) Volume: (cm) 0 % Reduction in Area: 100% 0 % Reduction in Volume: 100% 0 Epithelialization: Large (67-100%) 0 Tunneling: No 0 Undermining: No Wound Description Classification: Category/Stage II Wound Margin: Flat and Intact Exudate Amount: None Present Foul Odor After Cleansing: No Slough/Fibrino No Wound Bed Granulation Amount: None Present (0%) Exposed Structure Necrotic Amount: None Present (0%) Fascia Exposed: No Fat Layer (Subcutaneous Tissue) Exposed: No Tendon Exposed: No Muscle Exposed: No Joint Exposed: No Bone Exposed: No Limited to Skin Breakdown Electronic Signature(s) Signed: 09/16/2019 4:13:45 PM By: Montey Hora Entered By: Montey Hora on 09/16/2019 08:48:47 Seitzinger, [name] ([id number]) -------------------------------------------------------------------------------- Vitals Details Patient Name: [name] Date of Service: 09/16/2019 8:45 AM Medical Record Number: [id number] Patient Account Number: [id number] Date of Birth/Sex: May 21, 1932 (86 y.o. M) Treating RN: Montey Hora Primary Care Shandreka Dante: [name] Other Clinician: Referring Maigan Bittinger: [name] Treating Said Rueb/Extender: Melburn Hake, HOYT Weeks in Treatment: 3 Vital Signs Time Taken: 08:40 Temperature (F): 98.3 Height (in): 68 Pulse (bpm): 82 Weight (lbs): 145 Respiratory Rate (breaths/min): 16 Body Mass Index (BMI): 22 Blood Pressure (mmHg): 141/64 Reference Range: 80 - 120 mg / dl Electronic Signature(s) Signed: 09/16/2019 4:13:45 PM By: Montey Hora Entered By: Montey Hora on 09/16/2019 08:40:39

## 2019-09-23 ENCOUNTER — Other Ambulatory Visit: Payer: Self-pay

## 2019-09-23 ENCOUNTER — Encounter: Payer: Medicare Other | Admitting: Physician Assistant

## 2019-09-23 DIAGNOSIS — E11621 Type 2 diabetes mellitus with foot ulcer: Secondary | ICD-10-CM | POA: Diagnosis not present

## 2019-09-23 NOTE — Progress Notes (Signed)
ARLOW, SPIERS ([id number]) Visit Report for 09/23/2019 Arrival Information Details Patient Name: [name], [name] Date of Service: 09/23/2019 8:30 AM Medical Record Number: [id number] Patient Account Number: [id number] Date of Birth/Sex: 03-Jul-1931 (86 y.o. M) Treating RN: Montey Hora Primary Care Illiana Losurdo: [name] Other Clinician: Referring Florean Hoobler: [name] Treating Keyairra Kolinski/Extender: Melburn Hake, HOYT Weeks in Treatment: 4 Visit Information History Since Last Visit Added or deleted any medications: No Patient Arrived: Walker Any new allergies or adverse reactions: No Arrival Time: 08:35 Had a fall or experienced change in No Accompanied By: daughter activities of daily living that may affect Transfer Assistance: None risk of falls: Patient Identification Verified: Yes Signs or symptoms of abuse/neglect since last visito No Secondary Verification Process Completed: Yes Hospitalized since last visit: No Implantable device outside of the clinic excluding No cellular tissue based products placed in the center since last visit: Has Dressing in Place as Prescribed: Yes Pain Present Now: No Electronic Signature(s) Signed: 09/23/2019 3:40:49 PM By: Montey Hora Entered By: Montey Hora on 09/23/2019 08:35:46 Kadow, [name] ([id number]) -------------------------------------------------------------------------------- Encounter Discharge Information Details Patient Name: [name] Date of Service: 09/23/2019 8:30 AM Medical Record Number: [id number] Patient Account Number: [id number] Date of Birth/Sex: 09-Mar-1932 (86 y.o. M) Treating RN: Army Melia Primary Care Daphnee Preiss: [name] Other Clinician: Referring Jahmir Salo: [name] Treating Syan Cullimore/Extender: Melburn Hake, HOYT Weeks in Treatment: 4 Encounter Discharge Information Items Post Procedure Vitals Discharge Condition: Stable Temperature (F): 97.6 Ambulatory Status: Walker Pulse (bpm): 78 Discharge  Destination: Home Respiratory Rate (breaths/min): 16 Transportation: Private Auto Blood Pressure (mmHg): 138/67 Accompanied By: daughter Schedule Follow-up Appointment: Yes Clinical Summary of Care: Electronic Signature(s) Signed: 09/23/2019 9:44:09 AM By: Army Melia Entered By: Army Melia on 09/23/2019 09:02:15 Stiehl, [name] ([id number]) -------------------------------------------------------------------------------- Lower Extremity Assessment Details Patient Name: [name] Date of Service: 09/23/2019 8:30 AM Medical Record Number: [id number] Patient Account Number: [id number] Date of Birth/Sex: [birth year] (86 y.o. M) Treating RN: Montey Hora Primary Care Kosei Rhodes: [name] Other Clinician: Referring Brently Voorhis: [name] Treating Deyanira Fesler/Extender: Melburn Hake, HOYT Weeks in Treatment: 4 Vascular Assessment Pulses: Dorsalis Pedis Palpable: [Left:Yes] Electronic Signature(s) Signed: 09/23/2019 3:40:49 PM By: Montey Hora Entered By: Montey Hora on 09/23/2019 08:39:14 Wanek, [name] ([id number]) -------------------------------------------------------------------------------- Multi Wound Chart Details Patient Name: [name] Date of Service: 09/23/2019 8:30 AM Medical Record Number: [id number] Patient Account Number: [id number] Date of Birth/Sex: Apr 13, 1932 (86 y.o. M) Treating RN: Army Melia Primary Care Lorilyn Laitinen: [name] Other Clinician: Referring Prince Couey: [name] Treating Raekwan Spelman/Extender: Melburn Hake, HOYT Weeks in Treatment: 4 Vital Signs Height(in): 68 Pulse(bpm): 39 Weight(lbs): 145 Blood Pressure(mmHg): 138/67 Body Mass Index(BMI): 22 Temperature(F): 97.6 Respiratory Rate(breaths/min): 16 Photos: [N/A:N/A] Wound Location: Left Calcaneus N/A N/A Wounding Event: Gradually Appeared N/A N/A Primary Etiology: Pressure Ulcer N/A N/A Comorbid History: Cataracts, Hypertension, Peripheral N/A N/A Venous Disease, Type II  Diabetes, End Stage Renal Disease Date Acquired: 07/26/2019 N/A N/A Weeks of Treatment: 4 N/A N/A Wound Status: Open N/A N/A Measurements L x W x D (cm) 1.1x1.4x0.2 N/A N/A Area (cm) : 1.21 N/A N/A Volume (cm) : 0.242 N/A N/A % Reduction in Area: 15.30% N/A N/A % Reduction in Volume: -69.20% N/A N/A Classification: Category/Stage III N/A N/A Exudate Amount: Medium N/A N/A Exudate Type: Serous N/A N/A Exudate Color: amber N/A N/A Wound Margin: Flat and Intact N/A N/A Granulation Amount: Medium (34-66%) N/A N/A Granulation Quality: Pink N/A N/A Necrotic Amount: Medium (34-66%) N/A N/A Exposed Structures: Fat Layer (Subcutaneous Tissue) N/A  N/A Exposed: Yes Fascia: No Tendon: No Muscle: No Joint: No Bone: No Epithelialization: None N/A N/A Treatment Notes Electronic Signature(s) Signed: 09/23/2019 9:44:09 AM By: Army Melia Entered By: Army Melia on 09/23/2019 08:58:25 Detjen, [name] ([id number]) NAREN, BENALLY ([id number]) -------------------------------------------------------------------------------- [place] Details Patient Name: [name] Date of Service: 09/23/2019 8:30 AM Medical Record Number: [id number] Patient Account Number: [id number] Date of Birth/Sex: 1932/06/09 (86 y.o. M) Treating RN: Army Melia Primary Care Rogene Meth: [name] Other Clinician: Referring Genee Rann: [name] Treating Judye Lorino/Extender: Melburn Hake, HOYT Weeks in Treatment: 4 Active Inactive Abuse / Safety / Falls / Self Care Management Nursing Diagnoses: Impaired physical mobility Goals: Patient/caregiver will verbalize understanding of skin care regimen Date Initiated: 08/26/2019 Target Resolution Date: 09/08/2019 Goal Status: Active Interventions: Assess fall risk on admission and as needed Notes: Orientation to the Wound Care Program Nursing Diagnoses: Knowledge deficit related to the wound healing center program Goals: Patient/caregiver will  verbalize understanding of the [place] [facility] Date Initiated: 08/26/2019 Target Resolution Date: 09/08/2019 Goal Status: Active Interventions: Provide education on orientation to the wound center Notes: Pressure Nursing Diagnoses: Knowledge deficit related to causes and risk factors for pressure ulcer development Goals: Patient will remain free from development of additional pressure ulcers Date Initiated: 08/26/2019 Target Resolution Date: 09/08/2019 Goal Status: Active Interventions: Assess: immobility, friction, shearing, incontinence upon admission and as needed Notes: Wound/Skin Impairment Nursing Diagnoses: Impaired tissue integrity Pedrosa, Rain R. ([id number]) Goals: Ulcer/skin breakdown will have a volume reduction of 30% by week 4 Date Initiated: 08/26/2019 Target Resolution Date: 09/26/2019 Goal Status: Active Interventions: Assess patient/caregiver ability to obtain necessary supplies Treatment Activities: Skin care regimen initiated : 08/26/2019 Notes: Electronic Signature(s) Signed: 09/23/2019 9:44:09 AM By: Army Melia Entered By: Army Melia on 09/23/2019 08:58:16 Greeson, [name] ([id number]) -------------------------------------------------------------------------------- Pain Assessment Details Patient Name: [name] Date of Service: 09/23/2019 8:30 AM Medical Record Number: [id number] Patient Account Number: [id number] Date of Birth/Sex: 1932/05/14 (86 y.o. M) Treating RN: Montey Hora Primary Care Harrington Jobe: [name] Other Clinician: Referring Sakara Lehtinen: [name] Treating Charmion Hapke/Extender: Melburn Hake, HOYT Weeks in Treatment: 4 Active Problems Location of Pain Severity and Description of Pain Patient Has Paino No Site Locations Pain Management and Medication Current Pain Management: Electronic Signature(s) Signed: 09/23/2019 3:40:49 PM By: Montey Hora Entered By: Montey Hora on 09/23/2019 08:36:12 Northrup, [name]  ([id number]) -------------------------------------------------------------------------------- Patient/Caregiver Education Details Patient Name: [name] Date of Service: 09/23/2019 8:30 AM Medical Record Number: [id number] Patient Account Number: [id number] Date of Birth/Gender: October 02, 1931 (86 y.o. M) Treating RN: Army Melia Primary Care Physician: [name] Other Clinician: Referring Physician: [name] Treating Physician/Extender: Sharalyn Ink in Treatment: 4 Education Assessment Education Provided To: Patient Education Topics Provided Wound/Skin Impairment: Handouts: Caring for Your Ulcer Methods: Demonstration, Explain/Verbal Responses: State content correctly Electronic Signature(s) Signed: 09/23/2019 9:44:09 AM By: Army Melia Entered By: Army Melia on 09/23/2019 09:01:35 Sahr, [name] ([id number]) -------------------------------------------------------------------------------- Wound Assessment Details Patient Name: [name] Date of Service: 09/23/2019 8:30 AM Medical Record Number: [id number] Patient Account Number: [id number] Date of Birth/Sex: 02-09-1932 (86 y.o. M) Treating RN: Montey Hora Primary Care Irineo Gaulin: [name] Other Clinician: Referring Donalee Gaumond: [name] Treating Enes Rokosz/Extender: Melburn Hake, HOYT Weeks in Treatment: 4 Wound Status Wound Number: 2 Primary Pressure Ulcer Etiology: Wound Location: Left Calcaneus Wound Open Wounding Event: Gradually Appeared Status: Date Acquired: 07/26/2019 Comorbid Cataracts, Hypertension, Peripheral Venous Disease, Type Weeks Of Treatment: 4 History: II Diabetes, End Stage Renal  Disease Clustered Wound: No Photos Wound Measurements Length: (cm) 1.1 % Width: (cm) 1.4 % Depth: (cm) 0.2 Ep Area: (cm) 1.21 T Volume: (cm) 0.242 U Reduction in Area: 15.3% Reduction in Volume: -69.2% ithelialization: None unneling: No ndermining: No Wound Description Classification:  Category/Stage III F Wound Margin: Flat and Intact S Exudate Amount: Medium Exudate Type: Serous Exudate Color: amber oul Odor After Cleansing: No lough/Fibrino Yes Wound Bed Granulation Amount: Medium (34-66%) Exposed Structure Granulation Quality: Pink Fascia Exposed: No Necrotic Amount: Medium (34-66%) Fat Layer (Subcutaneous Tissue) Exposed: Yes Necrotic Quality: Adherent Slough Tendon Exposed: No Muscle Exposed: No Joint Exposed: No Bone Exposed: No Treatment Notes Wound #2 (Left Calcaneus) Notes iodoflex on heel with BFD Electronic Signature(s) Signed: 09/23/2019 3:40:49 PM By: Barrett Henle, [name] ([id number]) Entered By: Montey Hora on 09/23/2019 08:38:55 Edelen, [name] ([id number]) -------------------------------------------------------------------------------- Vitals Details Patient Name: [name] Date of Service: 09/23/2019 8:30 AM Medical Record Number: [id number] Patient Account Number: [id number] Date of Birth/Sex: Feb 17, 1932 (86 y.o. M) Treating RN: Montey Hora Primary Care Maximino Cozzolino: [name] Other Clinician: Referring Otoniel Myhand: [name] Treating Maritza Hosterman/Extender: Melburn Hake, HOYT Weeks in Treatment: 4 Vital Signs Time Taken: 08:35 Temperature (F): 97.6 Height (in): 68 Pulse (bpm): 78 Weight (lbs): 145 Respiratory Rate (breaths/min): 16 Body Mass Index (BMI): 22 Blood Pressure (mmHg): 138/67 Reference Range: 80 - 120 mg / dl Electronic Signature(s) Signed: 09/23/2019 3:40:49 PM By: Montey Hora Entered By: Montey Hora on 09/23/2019 08:36:07

## 2019-09-23 NOTE — Progress Notes (Addendum)
[name], [name] ([id number]) Visit Report for 09/23/2019 Chief Complaint Document Details Patient Name: [name] Date of Service: 09/23/2019 8:30 AM Medical Record Number: [id number] Patient Account Number: [id number] Date of Birth/Sex: Apr 28, 1932 (86 y.o. M) Treating RN: Army Melia Primary Care Provider: [name] Other Clinician: Referring Provider: [name] Treating Provider/Extender: Melburn Hake, Caroleen Stoermer Weeks in Treatment: 4 Information Obtained from: Patient Chief Complaint Left heel ulcer Electronic Signature(s) Signed: 09/23/2019 8:56:02 AM By: Worthy Keeler PA-C Previous Signature: 09/23/2019 8:55:31 AM Version By: Worthy Keeler PA-C Entered By: Worthy Keeler on 09/23/2019 08:56:02 [name], [name] ([id number]) -------------------------------------------------------------------------------- Debridement Details Patient Name: [name] Date of Service: 09/23/2019 8:30 AM Medical Record Number: [id number] Patient Account Number: [id number] Date of Birth/Sex: 05-Jan-1932 (87 y.o. M) Treating RN: Army Melia Primary Care Provider: [name] Other Clinician: Referring Provider: [name] Treating Provider/Extender: Melburn Hake, Matheus Spiker Weeks in Treatment: 4 Debridement Performed for Wound #2 Left Calcaneus Assessment: Performed By: Physician [name] III, Nicolaas Savo E., PA-C Debridement Type: Debridement Level of Consciousness (Pre- Awake and Alert procedure): Pre-procedure Verification/Time Out Yes - 08:58 Taken: Start Time: 08:59 Pain Control: Lidocaine Total Area Debrided (L x W): 1.1 (cm) x 1.4 (cm) = 1.54 (cm) Tissue and other material debrided: Viable, Non-Viable, Slough, Subcutaneous, Slough Level: Skin/Subcutaneous Tissue Debridement Description: Excisional Instrument: Curette Bleeding: Minimum Hemostasis Achieved: Pressure End Time: 09:00 Response to Treatment: Procedure was tolerated well Level of Consciousness (Post- Awake and  Alert procedure): Post Debridement Measurements of Total Wound Length: (cm) 1.1 Stage: Category/Stage III Width: (cm) 1.4 Depth: (cm) 0.2 Volume: (cm) 0.242 Character of Wound/Ulcer Post Debridement: Stable Post Procedure Diagnosis Same as Pre-procedure Electronic Signature(s) Signed: 09/23/2019 9:44:09 AM By: Army Melia Signed: 09/23/2019 5:42:16 PM By: Worthy Keeler PA-C Entered By: Army Melia on 09/23/2019 08:59:44 [name], [name] Kitchen ([id number]) -------------------------------------------------------------------------------- HPI Details Patient Name: [name] Date of Service: 09/23/2019 8:30 AM Medical Record Number: [id number] Patient Account Number: [id number] Date of Birth/Sex: [birth year] (87 y.o. M) Treating RN: Army Melia Primary Care Provider: [name] Other Clinician: Referring Provider: [name] Treating Provider/Extender: Melburn Hake, Larrie Lucia Weeks in Treatment: 4 History of Present Illness HPI Description: 05/19/18 on evaluation today patient presents for initial evaluation and clinic concerning issues that he has been having with his right medial lower leg ulcer. The patient has actually been seen in the emergency department for cellulitis he was admitted at [facility] on 04/19/18 discharged 04/21/18. He was noted to have proof of vascular disease and did undergo an angiogram on 04/20/18 by Dr. Leotis Pain. 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 Subsequently Dr. dew following intervention noted that the patient had depending on the location 20-40% residual stenosis noted and significant improvement in the overall vascular flow. Obviously this is great news. With that being said the patient's wound he states does seem to be doing some better compared to previous. Upon inspection indeed it appears that he has some epithelialization. Overall I do not  see any evidence of significant infection which is great news. No fevers chills noted 05/26/18 on evaluation today patient's wound actually appears to be doing much better at this time. With that being said I do think the [facility] Dressing a be sticking according to my nurse and this may be causing some issues as well. I'm gonna suggest at this point that we likely add  mepitel underneath the Hydrofera Blue Dressing help prevent this. Other than that everything seems to be doing excellent. 06/02/18 on evaluation today patient actually appears to be doing decently well in regard to his right lower extremity ulcer. This seems to show signs of good improvement which is excellent news. With that being said he did see vascular the good news is no further intervention is recommended nor necessary at this point. Actually placed in an [employer] and sent orders to home health which in the end it led to her switching the patient from the three layer compression wrap of the right lower extremity to an [employer] wrap. I feel like he did better in the three layer compression. With that being said the patient currently shows no signs of worsening the mepitel did help prevent any sticking to the wound bed which is great news. 06/12/18 an evaluation today patient presents for follow-up concerning his right lower extremity ulceration. The medial aspect ulcer appears to be doing fairly well this time which is good news. He has a blister on the lateral aspect although I'm concerned this may actually be due to the fact that his wrap is sliding down. We seem to be having issues with getting this wrapped appropriately were it will stay in place. Fortunately there does not appear to be any signs of infection at this time which is good news. He is seen with his son present at this point during the visit. 06/16/18 on evaluation today patient appears to be doing rather well and in fact appears to be almost completely healed in  regard to his lower extremity ulcer on the right. The wrap much better this week with [place] wrapping it and just keeping him in that same wrap until follow-up. For that reason we will discontinue home health services. Fortunately there does not appear to be the evidence of infection at this point. 06/23/18 on evaluation today patient appears to be doing very well in regard to his ulcer which in fact appears to be completely healed. He has been tolerating the wraps without complication although he is definitely ready to be out of these considering how well things have done. Fortunately there is not appear to be any signs of infection at this time which is great news. 08/26/2019 upon evaluation today patient appears to be doing somewhat poorly upon initial inspection here in our clinic for his current issue. This is a patient that I previously taken care of with regard to wounds on his legs. With that being said the issues that were having right now are actually separate from that he has a heel ulcer unfortunately and then subsequently also does have a wound in the right gluteal region that appears to be more of a skin tear. The patient does have a history of diabetes mellitus type 2, peripheral vascular disease, hypertension, and chronic kidney disease stage IV. He is also somewhat weak though he does like to walk around which is actually a good thing he does so slowly. He is currently at an assisted living facility at this point. 09/02/2019 upon evaluation today patient appears to be doing well with regard to the wound on his left heel as well as the right gluteal region. He has had the arterial studies I did look up the numbers and it appears that his TBI's were actually pretty good bilaterally measuring about 1 on the left and around 0.64 on the right. With that being said the final report is not here  yet we should have that by next week. Nonetheless looking at the results of the imaging I feel like he  has good arterial flow to be able to proceed with debridement today especially on this left heel. 09/09/2019 upon evaluation today patient's wounds actually appear to be showing signs of improvement. In the gluteal/sacral region he is showing signs of significant improvement and in fact I really do not see much that is exactly open although there is a lot of moisture type breakdown that I do see at this point. Nonetheless I believe that the patient can potentially transition from a dressing to just using a barrier cream in this area. In regard to the heel that it does appear to be showing signs of improvement I think the Iodoflex is doing a good job. 09/16/2019 upon evaluation today patient appears to be doing well with regard to his heel ulcer. He has been tolerating the dressing changes without complication. Fortunately there is no signs of active infection at this point which is great news. There is some question about physical therapy whether he should be cutting back on this or not. With that being said it does not appear its causing any damage to his heel I think he can actually continue with therapy at this point. 09/23/2019 upon evaluation today patient appears to be doing excellent in regard to his heel ulcer. He has been tolerating the dressing changes without complication. Fortunately there is no signs of active infection at this time. No fevers, chills, nausea, vomiting, or diarrhea. [name], [name] ([id number]) Electronic Signature(s) Signed: 09/23/2019 9:17:59 AM By: Worthy Keeler PA-C Entered By: Worthy Keeler on 09/23/2019 09:17:59 [name], [name] ([id number]) -------------------------------------------------------------------------------- Physical Exam Details Patient Name: [name] Date of Service: 09/23/2019 8:30 AM Medical Record Number: [id number] Patient Account Number: [id number] Date of Birth/Sex: Oct 08, 1931 (87 y.o. M) Treating RN: Army Melia Primary Care Provider:  [name] Other Clinician: Referring Provider: [name] Treating Provider/Extender: Melburn Hake, Deetta Siegmann Weeks in Treatment: 4 Constitutional Well-nourished and well-hydrated in no acute distress. Respiratory normal breathing without difficulty. Psychiatric this patient is able to make decisions and demonstrates good insight into disease process. Alert and Oriented x 3. pleasant and cooperative. Notes Does have some slough noted on the surface of the wound which did require sharp debridement today. I was actually able to perform sharp debridement without complication and post debridement the wound bed appears to be doing excellent. Overall very pleased with how things seem to be progressing. Electronic Signature(s) Signed: 09/23/2019 9:18:15 AM By: Worthy Keeler PA-C Entered By: Worthy Keeler on 09/23/2019 09:18:14 [name], [name] ([id number]) -------------------------------------------------------------------------------- Physician Orders Details Patient Name: [name] Date of Service: 09/23/2019 8:30 AM Medical Record Number: [id number] Patient Account Number: [id number] Date of Birth/Sex: [birth year] (87 y.o. M) Treating RN: Army Melia Primary Care Provider: [name] Other Clinician: Referring Provider: [name] Treating Provider/Extender: Melburn Hake, Artem Bunte Weeks in Treatment: 4 Verbal / Phone Orders: No Diagnosis Coding ICD-10 Coding Code Description E11.621 Type 2 diabetes mellitus with foot ulcer L89.620 Pressure ulcer of left heel, unstageable S31.819A Unspecified open wound of right buttock, initial encounter I73.89 Other specified peripheral vascular diseases I10 Essential (primary) hypertension N18.4 Chronic kidney disease, stage 4 (severe) Wound Cleansing Wound #2 Left Calcaneus o May Shower, gently pat wound dry prior to applying new dressing. Anesthetic (add to Medication List) Wound #2 Left Calcaneus o Topical Lidocaine 4% cream  applied to wound bed prior to debridement (In  Clinic Only). - in clinic only Primary Wound Dressing Wound #2 Left Calcaneus o Iodoflex Secondary Dressing Wound #2 Left Calcaneus o Boardered Foam Dressing Dressing Change Frequency Wound #2 Left Calcaneus o Change Dressing Monday, Wednesday, Friday Follow-up Appointments Wound #2 Left Calcaneus o Return Appointment in 1 week. Edema Control Wound #2 Left Calcaneus o Elevate legs to the level of the heart and pump ankles as often as possible Off-Loading Wound #2 Left Calcaneus o Turn and reposition every 2 hours Home Health Wound #2 Left [place] [facility] o Home Health Nurse may visit PRN to address patientos wound care needs. o FACE TO FACE ENCOUNTER: MEDICARE and MEDICAID PATIENTS: I certify that this patient is under my care and that I had a face-to- face encounter that meets the physician face-to-face encounter requirements with this patient on this date. The encounter with the patient was in whole or in part for the following MEDICAL CONDITION: (primary reason for [place]) MEDICAL NECESSITY: [name], [name] ([id number]) I certify, that based on my findings, NURSING services are a medically necessary home health service. HOME BOUND STATUS: I certify that my clinical findings support that this patient is homebound (i.e., Due to illness or injury, pt requires aid of supportive devices such as crutches, cane, wheelchairs, walkers, the use of special transportation or the assistance of another person to leave their place of residence. There is a normal inability to leave the home and doing so requires considerable and taxing effort. Other absences are for medical reasons / religious services and are infrequent or of short duration when for other reasons). o If current dressing causes regression in wound condition, may D/C ordered dressing product/s and apply Normal Saline  Moist Dressing daily until next [place] / Other MD appointment. Hernandez of regression in wound condition at [phone]. o Please direct any NON-WOUND related issues/requests for orders to patient's Primary Care Physician Electronic Signature(s) Signed: 09/23/2019 9:44:09 AM By: Army Melia Signed: 09/23/2019 5:42:16 PM By: Worthy Keeler PA-C Entered By: Army Melia on 09/23/2019 09:01:03 [name], [name] ([id number]) -------------------------------------------------------------------------------- Problem List Details Patient Name: [name] Date of Service: 09/23/2019 8:30 AM Medical Record Number: [id number] Patient Account Number: [id number] Date of Birth/Sex: 1931/08/26 (86 y.o. M) Treating RN: Army Melia Primary Care Provider: [name] Other Clinician: Referring Provider: [name] Treating Provider/Extender: Melburn Hake, Dyon Rotert Weeks in Treatment: 4 Active Problems ICD-10 Evaluated Encounter Code Description Active Date Today Diagnosis E11.621 Type 2 diabetes mellitus with foot ulcer 08/26/2019 No Yes L89.620 Pressure ulcer of left heel, unstageable 08/26/2019 No Yes S31.819A Unspecified open wound of right buttock, initial encounter 08/26/2019 No Yes I73.89 Other specified peripheral vascular diseases 08/26/2019 No Yes I10 Essential (primary) hypertension 08/26/2019 No Yes N18.4 Chronic kidney disease, stage 4 (severe) 08/26/2019 No Yes Inactive Problems Resolved Problems Electronic Signature(s) Signed: 09/23/2019 8:55:25 AM By: Worthy Keeler PA-C Entered By: Worthy Keeler on 09/23/2019 08:55:25 [name], [name] ([id number]) -------------------------------------------------------------------------------- Progress Note Details Patient Name: [name] Date of Service: 09/23/2019 8:30 AM Medical Record Number: [id number] Patient Account Number: [id number] Date of Birth/Sex: [birth year] (87 y.o. M) Treating RN: Army Melia Primary  Care Provider: [name] Other Clinician: Referring Provider: [name] Treating Provider/Extender: Melburn Hake, Daijha Leggio Weeks in Treatment: 4 Subjective Chief Complaint Information obtained from Patient Left heel ulcer History of Present Illness (HPI) 05/19/18 on evaluation today patient presents for initial evaluation and clinic concerning issues that he  has been having with his right medial lower leg ulcer. The patient has actually been seen in the emergency department for cellulitis he was admitted at [facility] on 04/19/18 discharged 04/21/18. He was noted to have proof of vascular disease and did undergo an angiogram on 04/20/18 by Dr. Leotis Pain. 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 Subsequently Dr. dew following intervention noted that the patient had depending on the location 20-40% residual stenosis noted and significant improvement in the overall vascular flow. Obviously this is great news. With that being said the patient's wound he states does seem to be doing some better compared to previous. Upon inspection indeed it appears that he has some epithelialization. Overall I do not see any evidence of significant infection which is great news. No fevers chills noted 05/26/18 on evaluation today patient's wound actually appears to be doing much better at this time. With that being said I do think the [facility] Dressing a be sticking according to my nurse and this may be causing some issues as well. I'm gonna suggest at this point that we likely add mepitel underneath the [facility] Dressing help prevent this. Other than that everything seems to be doing excellent. 06/02/18 on evaluation today patient actually appears to be doing decently well in regard to his right lower extremity ulcer. This seems to show signs of good improvement which is excellent news.  With that being said he did see vascular the good news is no further intervention is recommended nor necessary at this point. Actually placed in an [employer] and sent orders to home health which in the end it led to her switching the patient from the three layer compression wrap of the right lower extremity to an [employer] wrap. I feel like he did better in the three layer compression. With that being said the patient currently shows no signs of worsening the mepitel did help prevent any sticking to the wound bed which is great news. 06/12/18 an evaluation today patient presents for follow-up concerning his right lower extremity ulceration. The medial aspect ulcer appears to be doing fairly well this time which is good news. He has a blister on the lateral aspect although I'm concerned this may actually be due to the fact that his wrap is sliding down. We seem to be having issues with getting this wrapped appropriately were it will stay in place. Fortunately there does not appear to be any signs of infection at this time which is good news. He is seen with his son present at this point during the visit. 06/16/18 on evaluation today patient appears to be doing rather well and in fact appears to be almost completely healed in regard to his lower extremity ulcer on the right. The wrap much better this week with [place] wrapping it and just keeping him in that same wrap until follow-up. For that reason we will discontinue home health services. Fortunately there does not appear to be the evidence of infection at this point. 06/23/18 on evaluation today patient appears to be doing very well in regard to his ulcer which in fact appears to be completely healed. He has been tolerating the wraps without complication although he is definitely ready to be out of these considering how well things have done. Fortunately there is not appear to be any signs of infection at this time which is great  news. 08/26/2019 upon  evaluation today patient appears to be doing somewhat poorly upon initial inspection here in our clinic for his current issue. This is a patient that I previously taken care of with regard to wounds on his legs. With that being said the issues that were having right now are actually separate from that he has a heel ulcer unfortunately and then subsequently also does have a wound in the right gluteal region that appears to be more of a skin tear. The patient does have a history of diabetes mellitus type 2, peripheral vascular disease, hypertension, and chronic kidney disease stage IV. He is also somewhat weak though he does like to walk around which is actually a good thing he does so slowly. He is currently at an assisted living facility at this point. 09/02/2019 upon evaluation today patient appears to be doing well with regard to the wound on his left heel as well as the right gluteal region. He has had the arterial studies I did look up the numbers and it appears that his TBI's were actually pretty good bilaterally measuring about 1 on the left and around 0.64 on the right. With that being said the final report is not here yet we should have that by next week. Nonetheless looking at the results of the imaging I feel like he has good arterial flow to be able to proceed with debridement today especially on this left heel. 09/09/2019 upon evaluation today patient's wounds actually appear to be showing signs of improvement. In the gluteal/sacral region he is showing signs of significant improvement and in fact I really do not see much that is exactly open although there is a lot of moisture type breakdown that I do see at this point. Nonetheless I believe that the patient can potentially transition from a dressing to just using a barrier cream in this area. In regard to the heel that it does appear to be showing signs of improvement I think the Iodoflex is doing a good job. 09/16/2019 upon evaluation today  patient appears to be doing well with regard to his heel ulcer. He has been tolerating the dressing changes without complication. Fortunately there is no signs of active infection at this point which is great news. There is some question about physical therapy whether he should be cutting back on this or not. With that being said it does not appear its causing any damage to his heel I think he can actually continue with therapy at this point. [name], [employer] ([id number]) 09/23/2019 upon evaluation today patient appears to be doing excellent in regard to his heel ulcer. He has been tolerating the dressing changes without complication. Fortunately there is no signs of active infection at this time. No fevers, chills, nausea, vomiting, or diarrhea. Objective Constitutional Well-nourished and well-hydrated in no acute distress. Vitals Time Taken: 8:35 AM, Height: 68 in, Weight: 145 lbs, BMI: 22, Temperature: 97.6 F, Pulse: 78 bpm, Respiratory Rate: 16 breaths/min, Blood Pressure: 138/67 mmHg. Respiratory normal breathing without difficulty. Psychiatric this patient is able to make decisions and demonstrates good insight into disease process. Alert and Oriented x 3. pleasant and cooperative. General Notes: Does have some slough noted on the surface of the wound which did require sharp debridement today. I was actually able to perform sharp debridement without complication and post debridement the wound bed appears to be doing excellent. Overall very pleased with how things seem to be progressing. Integumentary (Hair, Skin) Wound #2 status is Open. Original  cause of wound was Gradually Appeared. The wound is located on the Left Calcaneus. The wound measures 1.1cm length x 1.4cm width x 0.2cm depth; 1.21cm^2 area and 0.242cm^3 volume. There is Fat Layer (Subcutaneous Tissue) Exposed exposed. There is no tunneling or undermining noted. There is a medium amount of serous drainage noted. The wound margin  is flat and intact. There is medium (34-66%) pink granulation within the wound bed. There is a medium (34-66%) amount of necrotic tissue within the wound bed including Adherent Slough. 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 There was a Excisional Skin/Subcutaneous Tissue Debridement with a total area of 1.54 sq cm performed by [name] III, Preston Weill E., PA-C. With the following instrument(s): Curette to remove Viable and Non-Viable tissue/material. Material removed includes Subcutaneous Tissue and Slough and after achieving pain control using Lidocaine. A time out was conducted at 08:58, prior to the start of the procedure. A Minimum amount of bleeding was controlled with Pressure. The procedure was tolerated well. Post Debridement Measurements: 1.1cm length x 1.4cm width x 0.2cm depth; 0.242cm^3 volume. Post debridement Stage noted as Category/Stage III. Character of Wound/Ulcer Post Debridement is stable. [name], [name] ([id number]) Post procedure Diagnosis Wound #2: Same as Pre-Procedure Plan Wound Cleansing: Wound #2 Left Calcaneus: May Shower, gently pat wound dry prior to applying new dressing. Anesthetic (add to Medication List): Wound #2 Left Calcaneus: Topical Lidocaine 4% cream applied to wound bed prior to debridement (In Clinic Only). - in clinic only Primary Wound Dressing: Wound #2 Left Calcaneus: Iodoflex Secondary Dressing: Wound #2 Left Calcaneus: Boardered Foam Dressing Dressing Change Frequency: Wound #2 Left Calcaneus: Change Dressing Monday, Wednesday, Friday Follow-up Appointments: Wound #2 Left  Calcaneus: Return Appointment in 1 week. Edema Control: Wound #2 Left Calcaneus: Elevate legs to the level of the heart and pump ankles as often as possible Off-Loading: Wound #2 Left Calcaneus: Turn and reposition every 2 hours Home Health: Wound #2 Left Calcaneus: Continue Home Health Visits - [facility] Nurse may visit PRN to address patient s wound care needs. FACE TO FACE ENCOUNTER: MEDICARE and MEDICAID PATIENTS: I certify that this patient is under my care and that I had a face-to-face encounter that meets the physician face-to-face encounter requirements with this patient on this date. The encounter with the patient was in whole or in part for the following MEDICAL CONDITION: (primary reason for [place]) MEDICAL NECESSITY: I certify, that based on my findings, NURSING services are a medically necessary home health service. HOME BOUND STATUS: I certify that my clinical findings support that this patient is homebound (i.e., Due to illness or injury, pt requires aid of supportive devices such as crutches, cane, wheelchairs, walkers, the use of special transportation or the assistance of another person to leave their place of residence. There is a normal inability to leave the home and doing so requires considerable and taxing effort. Other absences are for medical reasons / religious services and are infrequent or of short duration when for other reasons). If current dressing causes regression in wound condition, may D/C ordered dressing product/s and apply Normal Saline Moist Dressing daily until next [place] / Other MD appointment. [place] of regression  in wound condition at [phone]. Please direct any NON-WOUND related issues/requests for orders to patient's Primary Care Physician 1. My suggestion currently is can be that we go ahead and continue with the previous wound care measures specifically with regard to the Iodoflex which  I think is doing extremely well for the patient. 2. We will continue to cover this with a bordered foam dressing. Obviously this can protect the area very well on top of providing a good cover dressing. We will also provide some padding. We will see patient back for reevaluation in 1 week here in the clinic. If anything worsens or changes patient will contact our office for additional recommendations. Electronic Signature(s) Signed: 09/23/2019 9:18:57 AM By: Worthy Keeler PA-C Entered By: Worthy Keeler on 09/23/2019 09:18:57 Virgil, [name] ([id number]) -------------------------------------------------------------------------------- SuperBill Details Patient Name: [name] Date of Service: 09/23/2019 Medical Record Number: [id number] Patient Account Number: [id number] Date of Birth/Sex: 08-11-1931 (87 y.o. M) Treating RN: Army Melia Primary Care Provider: [name] Other Clinician: Referring Provider: [name] Treating Provider/Extender: Melburn Hake, Timothy Trudell Weeks in Treatment: 4 Diagnosis Coding ICD-10 Codes Code Description E11.621 Type 2 diabetes mellitus with foot ulcer L89.620 Pressure ulcer of left heel, unstageable S31.819A Unspecified open wound of right buttock, initial encounter I73.89 Other specified peripheral vascular diseases I10 Essential (primary) hypertension N18.4 Chronic kidney disease, stage 4 (severe) Facility Procedures CPT4 Code: 30092330 Description: 07622 - DEB SUBQ TISSUE 20 SQ CM/< Modifier: Quantity: 1 CPT4 Code: Description: ICD-10 Diagnosis Description L89.620 Pressure ulcer of left heel, unstageable Modifier: Quantity: Physician Procedures CPT4 Code: 6333545 Description: 62563 - WC PHYS SUBQ TISS 20 SQ CM Modifier: Quantity: 1 CPT4 Code: Description: ICD-10 Diagnosis Description L89.620 Pressure ulcer of left heel, unstageable Modifier: Quantity: Electronic Signature(s) Signed: 09/23/2019 9:19:08 AM By: Worthy Keeler  PA-C Entered By: Worthy Keeler on 09/23/2019 [place]

## 2019-09-30 ENCOUNTER — Encounter: Payer: Medicare Other | Admitting: Internal Medicine

## 2019-09-30 ENCOUNTER — Other Ambulatory Visit: Payer: Self-pay

## 2019-09-30 DIAGNOSIS — E11621 Type 2 diabetes mellitus with foot ulcer: Secondary | ICD-10-CM | POA: Diagnosis not present

## 2019-09-30 NOTE — Progress Notes (Signed)
[name] ([id number]) Visit Report for 09/30/2019 Debridement Details Patient Name: [name], [name] Date of Service: 09/30/2019 8:45 AM Medical Record Number: [id number] Patient Account Number: [id number] Date of Birth/Sex: December 09, 1931 (86 y.o. M) Treating RN: Army Melia Primary Care Provider: [name] Other Clinician: Referring Provider: [name] Treating Provider/Extender: Tito Dine in Treatment: 5 Debridement Performed for Wound #2 Left Calcaneus Assessment: Performed By: Physician Ricard Dillon, MD Debridement Type: Debridement Level of Consciousness (Pre- Awake and Alert procedure): Pre-procedure Verification/Time Out Yes - 09:03 Taken: Start Time: 09:04 Pain Control: Lidocaine Total Area Debrided (L x W): 0.8 (cm) x 1.2 (cm) = 0.96 (cm) Tissue and other material debrided: Viable, Non-Viable, Callus, Slough, Subcutaneous, Slough Level: Skin/Subcutaneous Tissue Debridement Description: Excisional Instrument: Curette Bleeding: Minimum Hemostasis Achieved: Pressure End Time: 09:05 Response to Treatment: Procedure was tolerated well Level of Consciousness (Post- Awake and Alert procedure): Post Debridement Measurements of Total Wound Length: (cm) 0.8 Stage: Category/Stage III Width: (cm) 1.2 Depth: (cm) 0.2 Volume: (cm) 0.151 Character of Wound/Ulcer Post Debridement: Stable Post Procedure Diagnosis Same as Pre-procedure Electronic Signature(s) Signed: 09/30/2019 10:18:26 AM By: Army Melia Signed: 09/30/2019 4:21:14 PM By: Linton Ham MD Entered By: Linton Ham on 09/30/2019 09:13:02 [place], [name] ([id number]) -------------------------------------------------------------------------------- HPI Details Patient Name: [name] Date of Service: 09/30/2019 8:45 AM Medical Record Number: [id number] Patient Account Number: [id number] Date of Birth/Sex: Nov 16, 1931 (87 y.o. M) Treating RN: Army Melia Primary Care Provider:  [name] Other Clinician: Referring Provider: [name] Treating Provider/Extender: Tito Dine in Treatment: 5 History of Present Illness HPI Description: 05/19/18 on evaluation today patient presents for initial evaluation and clinic concerning issues that he has been having with his right medial lower leg ulcer. The patient has actually been seen in the emergency department for cellulitis he was admitted at [facility] on 04/19/18 discharged 04/21/18. He was noted to have proof of vascular disease and did undergo an angiogram on 04/20/18 by Dr. Leotis Pain. 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 Subsequently Dr. dew following intervention noted that the patient had depending on the location 20-40% residual stenosis noted and significant improvement in the overall vascular flow. Obviously this is great news. With that being said the patient's wound he states does seem to be doing some better compared to previous. Upon inspection indeed it appears that he has some epithelialization. Overall I do not see any evidence of significant infection which is great news. No fevers chills noted 05/26/18 on evaluation today patient's wound actually appears to be doing much better at this time. With that being said I do think the [facility] Dressing a be sticking according to my nurse and this may be causing some issues as well. I'm gonna suggest at this point that we likely add mepitel underneath the [facility] Dressing help prevent this. Other than that everything seems to be doing excellent. 06/02/18 on evaluation today patient actually appears to be doing decently well in regard to his right lower extremity ulcer. This seems to show signs of good improvement which is excellent news. With that being said he did see vascular the good news is no further intervention is  recommended nor necessary at this point. Actually placed in an [employer] and sent orders to home health which in the end it led to her switching the patient from the three layer  compression wrap of the right lower extremity to an [employer] wrap. I feel like he did better in the three layer compression. With that being said the patient currently shows no signs of worsening the mepitel did help prevent any sticking to the wound bed which is great news. 06/12/18 an evaluation today patient presents for follow-up concerning his right lower extremity ulceration. The medial aspect ulcer appears to be doing fairly well this time which is good news. He has a blister on the lateral aspect although I'm concerned this may actually be due to the fact that his wrap is sliding down. We seem to be having issues with getting this wrapped appropriately were it will stay in place. Fortunately there does not appear to be any signs of infection at this time which is good news. He is seen with his son present at this point during the visit. 06/16/18 on evaluation today patient appears to be doing rather well and in fact appears to be almost completely healed in regard to his lower extremity ulcer on the right. The wrap much better this week with [place] wrapping it and just keeping him in that same wrap until follow-up. For that reason we will discontinue home health services. Fortunately there does not appear to be the evidence of infection at this point. 06/23/18 on evaluation today patient appears to be doing very well in regard to his ulcer which in fact appears to be completely healed. He has been tolerating the wraps without complication although he is definitely ready to be out of these considering how well things have done. Fortunately there is not appear to be any signs of infection at this time which is great news. 08/26/2019 upon evaluation today patient appears to be doing somewhat poorly upon initial inspection here in  our clinic for his current issue. This is a patient that I previously taken care of with regard to wounds on his legs. With that being said the issues that were having right now are actually separate from that he has a heel ulcer unfortunately and then subsequently also does have a wound in the right gluteal region that appears to be more of a skin tear. The patient does have a history of diabetes mellitus type 2, peripheral vascular disease, hypertension, and chronic kidney disease stage IV. He is also somewhat weak though he does like to walk around which is actually a good thing he does so slowly. He is currently at an assisted living facility at this point. 09/02/2019 upon evaluation today patient appears to be doing well with regard to the wound on his left heel as well as the right gluteal region. He has had the arterial studies I did look up the numbers and it appears that his TBI's were actually pretty good bilaterally measuring about 1 on the left and around 0.64 on the right. With that being said the final report is not here yet we should have that by next week. Nonetheless looking at the results of the imaging I feel like he has good arterial flow to be able to proceed with debridement today especially on this left heel. 09/09/2019 upon evaluation today patient's wounds actually appear to be showing signs of improvement. In the gluteal/sacral region he is showing signs of significant improvement and in fact I really do not see much that is exactly open although there is a lot of moisture type breakdown that I do see at this point. Nonetheless I believe that the patient can potentially  transition from a dressing to just using a barrier cream in this area. In regard to the heel that it does appear to be showing signs of improvement I think the Iodoflex is doing a good job. 09/16/2019 upon evaluation today patient appears to be doing well with regard to his heel ulcer. He has been tolerating the  dressing changes without complication. Fortunately there is no signs of active infection at this point which is great news. There is some question about physical therapy whether he should be cutting back on this or not. With that being said it does not appear its causing any damage to his heel I think he can actually continue with therapy at this point. 09/23/2019 upon evaluation today patient appears to be doing excellent in regard to his heel ulcer. He has been tolerating the dressing changes without complication. Fortunately there is no signs of active infection at this time. No fevers, chills, nausea, vomiting, or diarrhea. [name], [name] ([id number]) 4/22; right heel ulcer at the tip of his heel. Raised edges nonviable surface. We have been using Iodoflex dimensions of the wound improving oThe patient had a fall this week. He had a skin tear on his right upper lateral humeral area. We are asked to look at this today Electronic Signature(s) Signed: 09/30/2019 4:21:14 PM By: Linton Ham MD Entered By: Linton Ham on 09/30/2019 09:14:55 [place], [name] ([id number]) -------------------------------------------------------------------------------- Physical Exam Details Patient Name: [name] Date of Service: 09/30/2019 8:45 AM Medical Record Number: [id number] Patient Account Number: [id number] Date of Birth/Sex: [birth year] (87 y.o. M) Treating RN: Army Melia Primary Care Provider: [name] Other Clinician: Referring Provider: [name] Treating Provider/Extender: Tito Dine in Treatment: 5 Constitutional Sitting or standing Blood Pressure is within target range for patient.. Pulse regular and within target range for patient.Marland Kitchen Respirations regular, non- labored and within target range.. Temperature is normal and within the target range for the patient.Marland Kitchen appears in no distress. Notes Wound exam oRaised skin and subcutaneous tissue around the wound margins as  well as adherent debris on the wound surface. All of this debrided with a #5 curette. Hemostasis with a pressure dressing oOn the right lateral upper arm he has a fairly sizable skin tear. Wound surface looks healthy. The skin at risk is actually distal to this although it is not clearly in need of debridement that may come to this Electronic Signature(s) Signed: 09/30/2019 4:21:14 PM By: Linton Ham MD Entered By: Linton Ham on 09/30/2019 09:16:09 [place], [name] ([id number]) -------------------------------------------------------------------------------- Physician Orders Details Patient Name: [name] Date of Service: 09/30/2019 8:45 AM Medical Record Number: [id number] Patient Account Number: [id number] Date of Birth/Sex: Jan 12, 1932 (87 y.o. M) Treating RN: Army Melia Primary Care Provider: [name] Other Clinician: Referring Provider: [name] Treating Provider/Extender: Tito Dine in Treatment: 5 Verbal / Phone Orders: No Diagnosis Coding Wound Cleansing Wound #2 Left Calcaneus o May Shower, gently pat wound dry prior to applying new dressing. Wound #4 Right Upper Arm o May Shower, gently pat wound dry prior to applying new dressing. Anesthetic (add to Medication List) Wound #2 Left Calcaneus o Topical Lidocaine 4% cream applied to wound bed prior to debridement (In Clinic Only). - in clinic only Wound #4 Right Upper Arm o Topical Lidocaine 4% cream applied to wound bed prior to debridement (In Clinic Only). - in clinic only Primary Wound Dressing Wound #2 Left Calcaneus o Iodoflex Wound #4 Right Upper Arm o Collagen - moisten with  hydrogel Secondary Dressing Wound #2 Left Calcaneus o Boardered Foam Dressing Wound #4 Right Upper Arm o Conform/Kerlix Dressing Change Frequency Wound #2 Left Calcaneus o Change Dressing Monday, Wednesday, Friday Wound #4 Right Upper Arm o Change Dressing Monday, Wednesday,  Friday Follow-up Appointments Wound #2 Left Calcaneus o Return Appointment in 1 week. Wound #4 Right Upper Arm o Return Appointment in 1 week. Edema Control Wound #2 Left Calcaneus o Elevate legs to the level of the heart and pump ankles as often as possible Wound #4 Right Upper Arm o Elevate legs to the level of the heart and pump ankles as often as possible Off-Loading Wound #2 Left Calcaneus o Turn and reposition every 2 hours [name], [name] R. ([id number]) Wound #4 Right Upper Arm o Turn and reposition every 2 hours Home Health Wound #2 Left [place] [facility] o Home Health Nurse may visit PRN to address patientos wound care needs. o FACE TO FACE ENCOUNTER: MEDICARE and MEDICAID PATIENTS: I certify that this patient is under my care and that I had a face-to- face encounter that meets the physician face-to-face encounter requirements with this patient on this date. The encounter with the patient was in whole or in part for the following MEDICAL CONDITION: (primary reason for [place]) MEDICAL NECESSITY: I certify, that based on my findings, NURSING services are a medically necessary home health service. HOME BOUND STATUS: I certify that my clinical findings support that this patient is homebound (i.e., Due to illness or injury, pt requires aid of supportive devices such as crutches, cane, wheelchairs, walkers, the use of special transportation or the assistance of another person to leave their place of residence. There is a normal inability to leave the home and doing so requires considerable and taxing effort. Other absences are for medical reasons / religious services and are infrequent or of short duration when for other reasons). o If current dressing causes regression in wound condition, may D/C ordered dressing product/s and apply Normal Saline Moist Dressing daily until next [place] / Other MD appointment.  [place] of regression in wound condition at [phone]. o Please direct any NON-WOUND related issues/requests for orders to patient's Primary Care Physician Wound #4 Right Upper Arm o [place] Nurse may visit PRN to address patientos wound care needs. o FACE TO FACE ENCOUNTER: MEDICARE and MEDICAID PATIENTS: I certify that this patient is under my care and that I had a face-to- face encounter that meets the physician face-to-face encounter requirements with this patient on this date. The encounter with the patient was in whole or in part for the following MEDICAL CONDITION: (primary reason for [place]) MEDICAL NECESSITY: I certify, that based on my findings, NURSING services are a medically necessary home health service. HOME BOUND STATUS: I certify that my clinical findings support that this patient is homebound (i.e., Due to illness or injury, pt requires aid of supportive devices such as crutches, cane, wheelchairs, walkers, the use of special transportation or the assistance of another person to leave their place of residence. There is a normal inability to leave the home and doing so requires considerable and taxing effort. Other absences are for medical reasons / religious services and are infrequent or of short duration when for other reasons). o If current dressing causes regression in wound condition, may D/C ordered dressing product/s and apply Normal Saline Moist Dressing daily until next [place] /  Other MD appointment. [place] of regression in wound condition at [phone]. o Please direct any NON-WOUND related issues/requests for orders to patient's Primary Care Physician Electronic Signature(s) Signed: 09/30/2019 10:18:26 AM By: Army Melia Signed: 09/30/2019 4:21:14 PM By: Linton Ham MD Entered By: Army Melia on 09/30/2019 09:08:41 [name], [name]  ([id number]) -------------------------------------------------------------------------------- Problem List Details Patient Name: [name] Date of Service: 09/30/2019 8:45 AM Medical Record Number: [id number] Patient Account Number: [id number] Date of Birth/Sex: [birth year] (86 y.o. M) Treating RN: Army Melia Primary Care Provider: [name] Other Clinician: Referring Provider: [name] Treating Provider/Extender: Tito Dine in Treatment: 5 Active Problems ICD-10 Evaluated Encounter Code Description Active Date Today Diagnosis E11.621 Type 2 diabetes mellitus with foot ulcer 08/26/2019 No Yes S40.811D Abrasion of right upper arm, subsequent encounter 09/30/2019 No Yes L89.620 Pressure ulcer of left heel, unstageable 08/26/2019 No Yes I73.89 Other specified peripheral vascular diseases 08/26/2019 No Yes I10 Essential (primary) hypertension 08/26/2019 No Yes N18.4 Chronic kidney disease, stage 4 (severe) 08/26/2019 No Yes Inactive Problems ICD-10 Code Description Active Date Inactive Date S31.819A Unspecified open wound of right buttock, initial encounter 08/26/2019 08/26/2019 Resolved Problems Electronic Signature(s) Signed: 09/30/2019 4:21:14 PM By: Linton Ham MD Entered By: Linton Ham on 09/30/2019 09:12:27 [place], [name] ([id number]) -------------------------------------------------------------------------------- Progress Note Details Patient Name: [name] Date of Service: 09/30/2019 8:45 AM Medical Record Number: [id number] Patient Account Number: [id number] Date of Birth/Sex: 1932/05/16 (86 y.o. M) Treating RN: Army Melia Primary Care Provider: [name] Other Clinician: Referring Provider: [name] Treating Provider/Extender: Tito Dine in Treatment: 5 Subjective History of Present Illness (HPI) 05/19/18 on evaluation today patient presents for initial evaluation and clinic concerning issues that he has been  having with his right medial lower leg ulcer. The patient has actually been seen in the emergency department for cellulitis he was admitted at [facility] on 04/19/18 discharged 04/21/18. He was noted to have proof of vascular disease and did undergo an angiogram on 04/20/18 by Dr. Leotis Pain. 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 Subsequently Dr. dew following intervention noted that the patient had depending on the location 20-40% residual stenosis noted and significant improvement in the overall vascular flow. Obviously this is great news. With that being said the patient's wound he states does seem to be doing some better compared to previous. Upon inspection indeed it appears that he has some epithelialization. Overall I do not see any evidence of significant infection which is great news. No fevers chills noted 05/26/18 on evaluation today patient's wound actually appears to be doing much better at this time. With that being said I do think the [facility] Dressing a be sticking according to my nurse and this may be causing some issues as well. I'm gonna suggest at this point that we likely add mepitel underneath the [facility] Dressing help prevent this. Other than that everything seems to be doing excellent. 06/02/18 on evaluation today patient actually appears to be doing decently well in regard to his right lower extremity ulcer. This seems to show signs of good improvement which is excellent news. With that being said he did see vascular the good news is no further intervention is recommended nor necessary at this point. Actually placed in an [employer] and sent orders to home health which in the end it led to her  switching the patient from the three layer compression wrap of the right lower extremity to an [employer] wrap. I feel like he did better in the three layer  compression. With that being said the patient currently shows no signs of worsening the mepitel did help prevent any sticking to the wound bed which is great news. 06/12/18 an evaluation today patient presents for follow-up concerning his right lower extremity ulceration. The medial aspect ulcer appears to be doing fairly well this time which is good news. He has a blister on the lateral aspect although I'm concerned this may actually be due to the fact that his wrap is sliding down. We seem to be having issues with getting this wrapped appropriately were it will stay in place. Fortunately there does not appear to be any signs of infection at this time which is good news. He is seen with his son present at this point during the visit. 06/16/18 on evaluation today patient appears to be doing rather well and in fact appears to be almost completely healed in regard to his lower extremity ulcer on the right. The wrap much better this week with [place] wrapping it and just keeping him in that same wrap until follow-up. For that reason we will discontinue home health services. Fortunately there does not appear to be the evidence of infection at this point. 06/23/18 on evaluation today patient appears to be doing very well in regard to his ulcer which in fact appears to be completely healed. He has been tolerating the wraps without complication although he is definitely ready to be out of these considering how well things have done. Fortunately there is not appear to be any signs of infection at this time which is great news. 08/26/2019 upon evaluation today patient appears to be doing somewhat poorly upon initial inspection here in our clinic for his current issue. This is a patient that I previously taken care of with regard to wounds on his legs. With that being said the issues that were having right now are actually separate from that he has a heel ulcer unfortunately and then subsequently also does have a wound in  the right gluteal region that appears to be more of a skin tear. The patient does have a history of diabetes mellitus type 2, peripheral vascular disease, hypertension, and chronic kidney disease stage IV. He is also somewhat weak though he does like to walk around which is actually a good thing he does so slowly. He is currently at an assisted living facility at this point. 09/02/2019 upon evaluation today patient appears to be doing well with regard to the wound on his left heel as well as the right gluteal region. He has had the arterial studies I did look up the numbers and it appears that his TBI's were actually pretty good bilaterally measuring about 1 on the left and around 0.64 on the right. With that being said the final report is not here yet we should have that by next week. Nonetheless looking at the results of the imaging I feel like he has good arterial flow to be able to proceed with debridement today especially on this left heel. 09/09/2019 upon evaluation today patient's wounds actually appear to be showing signs of improvement. In the gluteal/sacral region he is showing signs of significant improvement and in fact I really do not see much that is exactly open although there is a lot of moisture type breakdown that I do see at this point.  Nonetheless I believe that the patient can potentially transition from a dressing to just using a barrier cream in this area. In regard to the heel that it does appear to be showing signs of improvement I think the Iodoflex is doing a good job. 09/16/2019 upon evaluation today patient appears to be doing well with regard to his heel ulcer. He has been tolerating the dressing changes without complication. Fortunately there is no signs of active infection at this point which is great news. There is some question about physical therapy whether he should be cutting back on this or not. With that being said it does not appear its causing any damage to his heel  I think he can actually continue with therapy at this point. 09/23/2019 upon evaluation today patient appears to be doing excellent in regard to his heel ulcer. He has been tolerating the dressing changes without complication. Fortunately there is no signs of active infection at this time. No fevers, chills, nausea, vomiting, or diarrhea. 4/22; right heel ulcer at the tip of his heel. Raised edges nonviable surface. We have been using Iodoflex dimensions of the wound improving [name], [name] R. ([id number]) The patient had a fall this week. He had a skin tear on his right upper lateral humeral area. We are asked to look at this today Objective Constitutional Sitting or standing Blood Pressure is within target range for patient.. Pulse regular and within target range for patient.Marland Kitchen Respirations regular, non- labored and within target range.. Temperature is normal and within the target range for the patient.Marland Kitchen appears in no distress. Vitals Time Taken: 8:49 AM, Height: 68 in, Weight: 145 lbs, BMI: 22, Temperature: 97.7 F, Pulse: 79 bpm, Respiratory Rate: 16 breaths/min, Blood Pressure: 140/69 mmHg. General Notes: Wound exam Raised skin and subcutaneous tissue around the wound margins as well as adherent debris on the wound surface. All of this debrided with a #5 curette. Hemostasis with a pressure dressing On the right lateral upper arm he has a fairly sizable skin tear. Wound surface looks healthy. The skin at risk is actually distal to this although it is not clearly in need of debridement that may come to this Integumentary (Hair, Skin) Wound #2 status is Open. Original cause of wound was Gradually Appeared. The wound is located on the Left Calcaneus. The wound measures 0.8cm length x 1.2cm width x 0.2cm depth; 0.754cm^2 area and 0.151cm^3 volume. There is Fat Layer (Subcutaneous Tissue) Exposed exposed. There is no tunneling or undermining noted. There is a medium amount of serous drainage noted. The  wound margin is flat and intact. There is medium (34-66%) pink granulation within the wound bed. There is a medium (34-66%) amount of necrotic tissue within the wound bed including Adherent Slough. Wound #4 status is Open. Original cause of wound was Trauma. The wound is located on the Right Upper Arm. The wound measures 2.6cm length x 3.4cm width x 0.1cm depth; 6.943cm^2 area and 0.694cm^3 volume. There is Fat Layer (Subcutaneous Tissue) Exposed exposed. There is no tunneling or undermining noted. There is a medium amount of serous drainage noted. The wound margin is flat and intact. There is large (67-100%) pink granulation within the wound bed. There is a small (1-33%) amount of necrotic tissue within the wound bed including Adherent Slough. Assessment Active Problems ICD-10 Type 2 diabetes mellitus with foot ulcer Abrasion of right upper arm, subsequent encounter Pressure ulcer of left heel, unstageable Other specified peripheral vascular diseases Essential (primary) hypertension Chronic kidney disease, stage 4 (  severe) Procedures Wound #2 Pre-procedure diagnosis of Wound #2 is a Pressure Ulcer located on the Left Calcaneus . There was a Excisional Skin/Subcutaneous Tissue Debridement with a total area of 0.96 sq cm performed by Ricard Dillon, MD. With the following instrument(s): Curette to remove Viable and Non-Viable tissue/material. Material removed includes Callus, Subcutaneous Tissue, and Slough after achieving pain control using Lidocaine. A time out was conducted at 09:03, prior to the start of the procedure. A Minimum amount of bleeding was controlled with Pressure. The procedure was tolerated well. Post Debridement Measurements: 0.8cm length x 1.2cm width x 0.2cm depth; 0.151cm^3 volume. Post debridement Stage noted as Category/Stage III. Character of Wound/Ulcer Post Debridement is stable. Post procedure Diagnosis Wound #2: Same as Pre-Procedure [name], [name] R.  ([id number]) Plan Wound Cleansing: Wound #2 Left Calcaneus: May Shower, gently pat wound dry prior to applying new dressing. Wound #4 Right Upper Arm: May Shower, gently pat wound dry prior to applying new dressing. Anesthetic (add to Medication List): Wound #2 Left Calcaneus: Topical Lidocaine 4% cream applied to wound bed prior to debridement (In Clinic Only). - in clinic only Wound #4 Right Upper Arm: Topical Lidocaine 4% cream applied to wound bed prior to debridement (In Clinic Only). - in clinic only Primary Wound Dressing: Wound #2 Left Calcaneus: Iodoflex Wound #4 Right Upper Arm: Collagen - moisten with hydrogel Secondary Dressing: Wound #2 Left Calcaneus: Boardered Foam Dressing Wound #4 Right Upper Arm: Conform/Kerlix Dressing Change Frequency: Wound #2 Left Calcaneus: Change Dressing Monday, Wednesday, Friday Wound #4 Right Upper Arm: Change Dressing Monday, Wednesday, Friday Follow-up Appointments: Wound #2 Left Calcaneus: Return Appointment in 1 week. Wound #4 Right Upper Arm: Return Appointment in 1 week. Edema Control: Wound #2 Left Calcaneus: Elevate legs to the level of the heart and pump ankles as often as possible Wound #4 Right Upper Arm: Elevate legs to the level of the heart and pump ankles as often as possible Off-Loading: Wound #2 Left Calcaneus: Turn and reposition every 2 hours Wound #4 Right Upper Arm: Turn and reposition every 2 hours Home Health: Wound #2 Left Calcaneus: Continue Home Health Visits - [facility] Nurse may visit PRN to address patient s wound care needs. FACE TO FACE ENCOUNTER: MEDICARE and MEDICAID PATIENTS: I certify that this patient is under my care and that I had a face-to-face encounter that meets the physician face-to-face encounter requirements with this patient on this date. The encounter with the patient was in whole or in part for the following MEDICAL CONDITION: (primary reason for [place])  MEDICAL NECESSITY: I certify, that based on my findings, NURSING services are a medically necessary home health service. HOME BOUND STATUS: I certify that my clinical findings support that this patient is homebound (i.e., Due to illness or injury, pt requires aid of supportive devices such as crutches, cane, wheelchairs, walkers, the use of special transportation or the assistance of another person to leave their place of residence. There is a normal inability to leave the home and doing so requires considerable and taxing effort. Other absences are for medical reasons / religious services and are infrequent or of short duration when for other reasons). If current dressing causes regression in wound condition, may D/C ordered dressing product/s and apply Normal Saline Moist Dressing daily until next [place] / Other MD appointment. [place] of regression in wound condition at [phone]. Please direct any NON-WOUND related issues/requests for orders to patient's Primary Care Physician Wound #4  Right Upper Arm: Continue Home Health Visits - [facility] Nurse may visit PRN to address patient s wound care needs. FACE TO FACE ENCOUNTER: MEDICARE and MEDICAID PATIENTS: I certify that this patient is under my care and that I had a face-to-face encounter that meets the physician face-to-face encounter requirements with this patient on this date. The encounter with the patient was in whole or in part for the following MEDICAL CONDITION: (primary reason for [place]) MEDICAL NECESSITY: I certify, that based on my findings, NURSING services are a medically necessary home health service. HOME BOUND STATUS: I certify that my clinical findings support that this patient is homebound (i.e., Due to illness or injury, pt requires aid of supportive devices such as crutches, cane, wheelchairs, walkers, the use of special App, [name] R. ([id number]) transportation or  the assistance of another person to leave their place of residence. There is a normal inability to leave the home and doing so requires considerable and taxing effort. Other absences are for medical reasons / religious services and are infrequent or of short duration when for other reasons). If current dressing causes regression in wound condition, may D/C ordered dressing product/s and apply Normal Saline Moist Dressing daily until next [place] / Other MD appointment. [place] of regression in wound condition at [phone]. Please direct any NON-WOUND related issues/requests for orders to patient's Primary Care Physician 1. Post debridement we continued with the Iodoflex to the heel wound. I was able to get the surface of the circumference commensurate with the wound edge. Surface debridement looks healthy under illumination. 2. We applied regular collagen hydrogel Curlex and conform to the right upper arm this will be changed by home health. Careful attention to skin distally. This may or may not remain viable. Electronic Signature(s) Signed: 09/30/2019 4:21:14 PM By: Linton Ham MD Entered By: Linton Ham on 09/30/2019 09:17:41 [name], [name] ([id number]) -------------------------------------------------------------------------------- SuperBill Details Patient Name: [name] Date of Service: 09/30/2019 Medical Record Number: [id number] Patient Account Number: [id number] Date of Birth/Sex: 09-01-1931 (86 y.o. M) Treating RN: Army Melia Primary Care Provider: [name] Other Clinician: Referring Provider: [name] Treating Provider/Extender: Tito Dine in Treatment: 5 Diagnosis Coding ICD-10 Codes Code Description [phone] Type 2 diabetes mellitus with foot ulcer S40.811D Abrasion of right upper arm, subsequent encounter L89.620 Pressure ulcer of left heel, unstageable I73.89 Other specified peripheral vascular  diseases I10 Essential (primary) hypertension N18.4 Chronic kidney disease, stage 4 (severe) Facility Procedures CPT4 Code: 19379024 Description: 09735 - DEB SUBQ TISSUE 20 SQ CM/< Modifier: Quantity: 1 CPT4 Code: Description: ICD-10 Diagnosis Description L89.620 Pressure ulcer of left heel, unstageable Modifier: Quantity: Physician Procedures CPT4 Code: 3299242 Description: 68341 - WC PHYS SUBQ TISS 20 SQ CM Modifier: Quantity: 1 CPT4 Code: Description: ICD-10 Diagnosis Description L89.620 Pressure ulcer of left heel, unstageable Modifier: Quantity: Electronic Signature(s) Signed: 09/30/2019 4:21:14 PM By: Linton Ham MD Entered By: Linton Ham on 09/30/2019 09:18:00

## 2019-09-30 NOTE — Progress Notes (Signed)
MACGUIRE, HOLSINGER ([id number]) Visit Report for 09/30/2019 Arrival Information Details Patient Name: [name], [name] Date of Service: 09/30/2019 8:45 AM Medical Record Number: [id number] Patient Account Number: [id number] Date of Birth/Sex: 09/10/1931 (86 y.o. M) Treating RN: Montey Hora Primary Care Ark Agrusa: [name] Other Clinician: Referring Oberon Hehir: [name] Treating Mariselda Badalamenti/Extender: Tito Dine in Treatment: 5 Visit Information History Since Last Visit Added or deleted any medications: No Patient Arrived: [place] Any new allergies or adverse reactions: No Arrival Time: 08:48 Had a fall or experienced change in No Accompanied By: daughter activities of daily living that may affect Transfer Assistance: None risk of falls: Patient Identification Verified: Yes Signs or symptoms of abuse/neglect since last visito No Secondary Verification Process Completed: Yes Hospitalized since last visit: No Implantable device outside of the clinic excluding No cellular tissue based products placed in the center since last visit: Has Dressing in Place as Prescribed: Yes Pain Present Now: No Electronic Signature(s) Signed: 09/30/2019 4:06:41 PM By: Montey Hora Entered By: Montey Hora on 09/30/2019 08:49:03 Youngers, [name] -------------------------------------------------------------------------------- Encounter Discharge Information Details Patient Name: [name] Date of Service: 09/30/2019 8:45 AM Medical Record Number: [id number] Patient Account Number: [id number] Date of Birth/Sex: Aug 13, 1931 (87 y.o. M) Treating RN: Army Melia Primary Care [name]: [name] Other Clinician: Referring Cayce Quezada: [name] Treating Aijalon Demuro/Extender: Tito Dine in Treatment: 5 Encounter Discharge Information Items Post Procedure Vitals Discharge Condition: Stable Temperature (F): 97.7 Ambulatory Status: Walker Pulse (bpm):  79 Discharge Destination: [place] Respiratory Rate (breaths/min): 16 Telephoned: No Blood Pressure (mmHg): 140/69 Orders Sent: Yes Transportation: Private Auto Accompanied By: daughter Schedule Follow-up Appointment: Yes Clinical Summary of Care: Electronic Signature(s) Signed: 09/30/2019 10:18:26 AM By: Army Melia Entered By: Army Melia on 09/30/2019 09:10:27 Panzer, [name] -------------------------------------------------------------------------------- Lower Extremity Assessment Details Patient Name: [name] Date of Service: 09/30/2019 8:45 AM Medical Record Number: [id number] Patient Account Number: [id number] Date of Birth/Sex: 20-Dec-1931 (87 y.o. M) Treating RN: Montey Hora Primary Care Deaun Rocha: [name] Other Clinician: Referring Willer Osorno: [name] Treating Maeley Matton/Extender: Ricard Dillon Weeks in Treatment: 5 Edema Assessment Assessed: [Left: No] [Right: No] Edema: [Left: N] [Right: o] Vascular Assessment Pulses: Dorsalis Pedis Palpable: [Left:Yes] Electronic Signature(s) Signed: 09/30/2019 4:06:41 PM By: Montey Hora Entered By: Montey Hora on 09/30/2019 08:53:45 Sanjuan, [name] -------------------------------------------------------------------------------- Multi Wound Chart Details Patient Name: [name] Date of Service: 09/30/2019 8:45 AM Medical Record Number: [id number] Patient Account Number: [id number] Date of Birth/Sex: 09-24-1931 (87 y.o. M) Treating RN: Army Melia Primary Care Philomena Buttermore: [name] Other Clinician: Referring Novah Nessel: [name] Treating Kahlia Lagunes/Extender: Tito Dine in Treatment: 5 Vital Signs Height(in): 46 Pulse(bpm): 22 Weight(lbs): 145 Blood Pressure(mmHg): 140/69 Body Mass Index(BMI): 22 Temperature(F): 97.7 Respiratory Rate(breaths/min): 16 Photos: [N/A:N/A] Wound Location: Left Calcaneus Right Upper Arm N/A Wounding Event:  Gradually Appeared Trauma N/A Primary Etiology: Pressure Ulcer Skin Tear N/A Comorbid History: Cataracts, Hypertension, Peripheral Cataracts, Hypertension, Peripheral N/A Venous Disease, Type II Diabetes, Venous Disease, Type II Diabetes, End Stage Renal Disease End Stage Renal Disease Date Acquired: 07/26/2019 09/26/2019 N/A Weeks of Treatment: 5 0 N/A Wound Status: Open Open N/A Measurements L x W x D (cm) 0.8x1.2x0.2 2.6x3.4x0.1 N/A Area (cm) : 0.754 6.943 N/A Volume (cm) : 0.151 0.694 N/A % Reduction in Area: 47.20% N/A N/A % Reduction in Volume: -5.60% N/A N/A Classification: Category/Stage III Full Thickness Without Exposed N/A Support Structures Exudate Amount: Medium Medium N/A Exudate Type: Serous  Serous N/A Exudate Color: amber amber N/A Wound Margin: Flat and Intact Flat and Intact N/A Granulation Amount: Medium (34-66%) Large (67-100%) N/A Granulation Quality: Pink Pink N/A Necrotic Amount: Medium (34-66%) Small (1-33%) N/A Exposed Structures: Fat Layer (Subcutaneous Tissue) Fat Layer (Subcutaneous Tissue) N/A Exposed: Yes Exposed: Yes Fascia: No Fascia: No Tendon: No Tendon: No Muscle: No Muscle: No Joint: No Joint: No Bone: No Bone: No Epithelialization: None None N/A Debridement: Debridement - Excisional N/A N/A Pre-procedure Verification/Time 09:03 N/A N/A Out Taken: Pain Control: Lidocaine N/A N/A Tissue Debrided: Callus, Subcutaneous, Slough N/A N/A Level: Skin/Subcutaneous Tissue N/A N/A Debridement Area (sq cm): 0.96 N/A N/A Ucci, Zoe R. ([id number]) Instrument: Curette N/A N/A Bleeding: Minimum N/A N/A Hemostasis Achieved: Pressure N/A N/A Debridement Treatment Procedure was tolerated well N/A N/A Response: Post Debridement Measurements 0.8x1.2x0.2 N/A N/A L x W x D (cm) Post Debridement Volume: (cm) 0.151 N/A N/A Post Debridement Stage: Category/Stage III N/A N/A Procedures Performed: Debridement N/A N/A Treatment Notes Wound #2 (Left  Calcaneus) Notes iodoflex to heel with BFD, collagen moistened with hydrogel to right arm, comforn Wound #4 (Right Upper Arm) Notes iodoflex to heel with BFD, collagen moistened with hydrogel to right arm, comforn Electronic Signature(s) Signed: 09/30/2019 4:21:14 PM By: Linton Ham MD Entered By: Linton Ham on 09/30/2019 09:12:50 Glanz, [name] ([id number]) -------------------------------------------------------------------------------- Multi-Disciplinary Care Plan Details Patient Name: [name] Date of Service: 09/30/2019 8:45 AM Medical Record Number: [id number] Patient Account Number: [id number] Date of Birth/Sex: 10-20-1931 (86 y.o. M) Treating RN: Army Melia Primary Care Jaasia Viglione: [name] Other Clinician: Referring Karo Rog: [name] Treating Brynn Mulgrew/Extender: Tito Dine in Treatment: 5 Active Inactive Abuse / Safety / Falls / Self Care Management Nursing Diagnoses: Impaired physical mobility Goals: Patient/caregiver will verbalize understanding of skin care regimen Date Initiated: 08/26/2019 Target Resolution Date: 09/08/2019 Goal Status: Active Interventions: Assess fall risk on admission and as needed Notes: Orientation to the Wound Care Program Nursing Diagnoses: Knowledge deficit related to the wound healing center program Goals: Patient/caregiver will verbalize understanding of the [place] Date Initiated: 08/26/2019 Target Resolution Date: 09/08/2019 Goal Status: Active Interventions: Provide education on orientation to the wound center Notes: Pressure Nursing Diagnoses: Knowledge deficit related to causes and risk factors for pressure ulcer development Goals: Patient will remain free from development of additional pressure ulcers Date Initiated: 08/26/2019 Target Resolution Date: 09/08/2019 Goal Status: Active Interventions: Assess: immobility, friction, shearing, incontinence upon admission and as  needed Notes: Wound/Skin Impairment Nursing Diagnoses: Impaired tissue integrity Drennan, Allen R. ([id number]) Goals: Ulcer/skin breakdown will have a volume reduction of 30% by week 4 Date Initiated: 08/26/2019 Target Resolution Date: 09/26/2019 Goal Status: Active Interventions: Assess patient/caregiver ability to obtain necessary supplies Treatment Activities: Skin care regimen initiated : 08/26/2019 Notes: Electronic Signature(s) Signed: 09/30/2019 10:18:26 AM By: Army Melia Entered By: Army Melia on 09/30/2019 09:02:37 Goding, [name] ([id number]) -------------------------------------------------------------------------------- Pain Assessment Details Patient Name: [name] Date of Service: 09/30/2019 8:45 AM Medical Record Number: [id number] Patient Account Number: [id number] Date of Birth/Sex: 1931-09-20 (87 y.o. M) Treating RN: Montey Hora Primary Care Carlyne Keehan: [name] Other Clinician: Referring Zarianna Dicarlo: [name] Treating Ellaina Schuler/Extender: Tito Dine in Treatment: 5 Active Problems Location of Pain Severity and Description of Pain Patient Has Paino No Site Locations Pain Management and Medication Current Pain Management: Electronic Signature(s) Signed: 09/30/2019 4:06:41 PM By: Montey Hora Entered By: Montey Hora on 09/30/2019 08:53:35 Gauthreaux, [name] ([id number]) -------------------------------------------------------------------------------- Patient/Caregiver Education Details Patient Name:  Marshell Levan R. Date of Service: 09/30/2019 8:45 AM Medical Record Number: [id number] Patient Account Number: [id number] Date of Birth/Gender: December 04, 1931 (86 y.o. M) Treating RN: Army Melia Primary Care Physician: [name] Other Clinician: Referring Physician: [name] Treating Physician/Extender: Tito Dine in Treatment: 5 Education Assessment Education Provided To: Patient Education Topics Provided Wound/Skin  Impairment: Handouts: Caring for Your Ulcer Methods: Demonstration, Explain/Verbal Responses: State content correctly Electronic Signature(s) Signed: 09/30/2019 10:18:26 AM By: Army Melia Entered By: Army Melia on 09/30/2019 09:09:03 Schwager, [name] ([id number]) -------------------------------------------------------------------------------- Wound Assessment Details Patient Name: [name] Date of Service: 09/30/2019 8:45 AM Medical Record Number: [id number] Patient Account Number: [id number] Date of Birth/Sex: [birth year] (87 y.o. M) Treating RN: Montey Hora Primary Care Darlynn Ricco: [name] Other Clinician: Referring Drucella Karbowski: [name] Treating Turon Kilmer/Extender: Tito Dine in Treatment: 5 Wound Status Wound Number: 2 Primary Pressure Ulcer Etiology: Wound Location: Left Calcaneus Wound Open Wounding Event: Gradually Appeared Status: Date Acquired: 07/26/2019 Comorbid Cataracts, Hypertension, Peripheral Venous Disease, Type Weeks Of Treatment: 5 History: II Diabetes, End Stage Renal Disease Clustered Wound: No Photos Wound Measurements Length: (cm) 0.8 Width: (cm) 1.2 Depth: (cm) 0.2 Area: (cm) 0.754 Volume: (cm) 0.151 % Reduction in Area: 47.2% % Reduction in Volume: -5.6% Epithelialization: None Tunneling: No Undermining: No Wound Description Classification: Category/Stage III Wound Margin: Flat and Intact Exudate Amount: Medium Exudate Type: Serous Exudate Color: amber Foul Odor After Cleansing: No Slough/Fibrino Yes Wound Bed Granulation Amount: Medium (34-66%) Exposed Structure Granulation Quality: Pink Fascia Exposed: No Necrotic Amount: Medium (34-66%) Fat Layer (Subcutaneous Tissue) Exposed: Yes Necrotic Quality: Adherent Slough Tendon Exposed: No Muscle Exposed: No Joint Exposed: No Bone Exposed: No Treatment Notes Wound #2 (Left Calcaneus) Notes iodoflex to heel with BFD, collagen moistened with hydrogel to right  arm, comforn Electronic Signature(s) Signed: 09/30/2019 4:06:41 PM By: Barrett Henle, [name] ([id number]) Entered By: Montey Hora on 09/30/2019 08:58:02 Ramires, [name] ([id number]) -------------------------------------------------------------------------------- Wound Assessment Details Patient Name: [name] Date of Service: 09/30/2019 8:45 AM Medical Record Number: [id number] Patient Account Number: [id number] Date of Birth/Sex: [birth year] (86 y.o. M) Treating RN: Montey Hora Primary Care Riah Kehoe: [name] Other Clinician: Referring Nanci Lakatos: [name] Treating Brittania Sudbeck/Extender: Tito Dine in Treatment: 5 Wound Status Wound Number: 4 Primary Skin Tear Etiology: Wound Location: Right Upper Arm Wound Open Wounding Event: Trauma Status: Date Acquired: 09/26/2019 Comorbid Cataracts, Hypertension, Peripheral Venous Disease, Type Weeks Of Treatment: 0 History: II Diabetes, End Stage Renal Disease Clustered Wound: No Photos Wound Measurements Length: (cm) 2.6 Width: (cm) 3.4 Depth: (cm) 0.1 Area: (cm) 6.943 Volume: (cm) 0.694 % Reduction in Area: % Reduction in Volume: Epithelialization: None Tunneling: No Undermining: No Wound Description Classification: Full Thickness Without Exposed Support Structures Wound Margin: Flat and Intact Exudate Amount: Medium Exudate Type: Serous Exudate Color: amber Foul Odor After Cleansing: No Slough/Fibrino Yes Wound Bed Granulation Amount: Large (67-100%) Exposed Structure Granulation Quality: Pink Fascia Exposed: No Necrotic Amount: Small (1-33%) Fat Layer (Subcutaneous Tissue) Exposed: Yes Necrotic Quality: Adherent Slough Tendon Exposed: No Muscle Exposed: No Joint Exposed: No Bone Exposed: No Treatment Notes Wound #4 (Right Upper Arm) Notes iodoflex to heel with BFD, collagen moistened with hydrogel to right arm, comforn Electronic Signature(s) Signed: 09/30/2019 4:06:41 PM By:  Barrett Henle, [name] ([id number]) Entered By: Montey Hora on 09/30/2019 08:55:25 Talerico, [name] ([id number]) -------------------------------------------------------------------------------- Vitals Details Patient Name: [name] Date of Service: 09/30/2019 8:45 AM Medical Record Number: [id number] Patient  Account Number: [id number] Date of Birth/Sex: [birth year] (87 y.o. M) Treating RN: Montey Hora Primary Care Tausha Milhoan: [name] Other Clinician: Referring Chauntae Hults: [name] Treating Tonny Isensee/Extender: Tito Dine in Treatment: 5 Vital Signs Time Taken: 08:49 Temperature (F): 97.7 Height (in): 68 Pulse (bpm): 79 Weight (lbs): 145 Respiratory Rate (breaths/min): 16 Body Mass Index (BMI): 22 Blood Pressure (mmHg): 140/69 Reference Range: 80 - 120 mg / dl Electronic Signature(s) Signed: 09/30/2019 4:06:41 PM By: Montey Hora Entered By: Montey Hora on 09/30/2019 08:51:41

## 2019-10-07 ENCOUNTER — Other Ambulatory Visit: Payer: Self-pay

## 2019-10-07 ENCOUNTER — Encounter: Payer: Medicare Other | Admitting: Physician Assistant

## 2019-10-07 DIAGNOSIS — E11621 Type 2 diabetes mellitus with foot ulcer: Secondary | ICD-10-CM | POA: Diagnosis not present

## 2019-10-07 NOTE — Progress Notes (Addendum)
[name] ([id number]) Visit Report for 10/07/2019 Chief Complaint Document Details Patient Name: [name], [name] Date of Service: 10/07/2019 8:45 AM Medical Record Number: [id number] Patient Account Number: [id number] Date of Birth/Sex: 04/04/1932 (86 y.o. M) Treating RN: Army Melia Primary Care Provider: [name] Other Clinician: Referring Provider: [name] Treating Provider/Extender: Melburn Hake, Ryeleigh Santore Weeks in Treatment: 6 Information Obtained from: Patient Chief Complaint Left heel ulcer Electronic Signature(s) Signed: 10/07/2019 9:10:28 AM By: Worthy Keeler [name] Entered By: Worthy Keeler on 10/07/2019 09:10:28 [name] ([id number]) -------------------------------------------------------------------------------- Debridement Details Patient Name: [name] Date of Service: 10/07/2019 8:45 AM Medical Record Number: [id number] Patient Account Number: [id number] Date of Birth/Sex: 1931-08-13 (87 y.o. M) Treating RN: Army Melia Primary Care Provider: [name] Other Clinician: Referring Provider: [name] Treating Provider/Extender: Melburn Hake, Aravind Chrismer Weeks in Treatment: 6 Debridement Performed for Wound #4 Right Upper Arm Assessment: Performed By: Physician [name] Debridement Type: Debridement Level of Consciousness (Pre- Awake and Alert procedure): Pre-procedure Verification/Time Out Yes - 09:36 Taken: Start Time: 09:37 [name] Control: Lidocaine Total Area Debrided (L x W): 2.5 (cm) x 3 (cm) = 7.5 (cm) Tissue and other material Viable, Non-Viable, Subcutaneous, Skin: Dermis debrided: Level: Skin/Subcutaneous Tissue Debridement Description: Excisional Instrument: Forceps, Scissors Bleeding: Minimum Hemostasis Achieved: Pressure End Time: 09:38 Response to Treatment: Procedure was tolerated well Level of Consciousness (Post- Awake and Alert procedure): Post Debridement Measurements of Total Wound Length: (cm)  2.5 Width: (cm) 3 Depth: (cm) 0.1 Volume: (cm) 0.589 Character of Wound/Ulcer Post Debridement: Stable Post Procedure Diagnosis Same as Pre-procedure Electronic Signature(s) Signed: 10/07/2019 12:47:05 PM By: Army Melia Signed: 10/07/2019 5:56:59 PM By: Worthy Keeler [name] Entered By: Army Melia on 10/07/2019 09:38:17 [name] ([id number]) -------------------------------------------------------------------------------- Debridement Details Patient Name: [name] Date of Service: 10/07/2019 8:45 AM Medical Record Number: [id number] Patient Account Number: [id number] Date of Birth/Sex: August 03, 1931 (86 y.o. M) Treating RN: Army Melia Primary Care Provider: [name] Other Clinician: Referring Provider: [name] Treating Provider/Extender: Melburn Hake, Ryker Sudbury Weeks in Treatment: 6 Debridement Performed for Wound #2 Left Calcaneus Assessment: Performed By: Physician [name] Debridement Type: Debridement Level of Consciousness (Pre- Awake and Alert procedure): Pre-procedure Verification/Time Out Yes - 09:40 Taken: Start Time: 09:40 [name] Control: Lidocaine Total Area Debrided (L x W): 0.8 (cm) x 1.2 (cm) = 0.96 (cm) Tissue and other material Viable, Non-Viable, Slough, Subcutaneous, Skin: Dermis , Slough debrided: Level: Skin/Subcutaneous Tissue Debridement Description: Excisional Instrument: Forceps, Scissors Bleeding: Minimum Hemostasis Achieved: Pressure End Time: 09:43 Procedural [name]: 0 Post Procedural [name]: 0 Response to Treatment: Procedure was tolerated well Level of Consciousness (Post- Awake and Alert procedure): Post Debridement Measurements of Total Wound Length: (cm) 0.8 Stage: Category/Stage III Width: (cm) 1.2 Depth: (cm) 0.5 Volume: (cm) 0.377 Character of Wound/Ulcer Post Debridement: Improved Post Procedure Diagnosis Same as Pre-procedure Electronic Signature(s) Signed: 10/07/2019 12:47:05 PM By: Army Melia Signed: 10/07/2019 5:56:59 PM By: Worthy Keeler [name] Entered By: Army Melia on 10/07/2019 09:44:23 [name] ([id number]) -------------------------------------------------------------------------------- HPI Details Patient Name: [name] Date of Service: 10/07/2019 8:45 AM Medical Record Number: [id number] Patient Account Number: [id number] Date of Birth/Sex: 1931-08-23 (86 y.o. M) Treating RN: Army Melia Primary Care Provider: [name] Other Clinician: Referring Provider: [name] Treating Provider/Extender: Melburn Hake, Ko Bardon Weeks in Treatment: 6 History of Present Illness HPI Description: 05/19/18 on evaluation today patient presents for initial evaluation and clinic concerning issues that he  has been having with his right medial lower leg ulcer. The patient has actually been seen in the emergency department for cellulitis he was admitted at [facility] on 04/19/18 discharged 04/21/18. He was noted to have proof of vascular disease and did undergo an angiogram on 04/20/18 by [name]. 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 Subsequently Dr. dew following intervention noted that the patient had depending on the location 20-40% residual stenosis noted and significant improvement in the overall vascular flow. Obviously this is great news. With that being said the patient's wound he states does seem to be doing some better compared to previous. Upon inspection indeed it appears that he has some epithelialization. Overall I do not see any evidence of significant infection which is great news. No fevers chills noted 05/26/18 on evaluation today patient's wound actually appears to be doing much better at this time. With that being said I do think the [facility] Dressing a be sticking according to my nurse and this may be causing some issues as well.  I'm gonna suggest at this point that we likely add mepitel underneath the [facility] Dressing help prevent this. Other than that everything seems to be doing excellent. 06/02/18 on evaluation today patient actually appears to be doing decently well in regard to his right lower extremity ulcer. This seems to show signs of good improvement which is excellent news. With that being said he did see vascular the good news is no further intervention is recommended nor necessary at this point. Actually placed in an [employer] and sent orders to home health which in the end it led to her switching the patient from the three layer compression wrap of the right lower extremity to an [employer] wrap. I feel like he did better in the three layer compression. With that being said the patient currently shows no signs of worsening the mepitel did help prevent any sticking to the wound bed which is great news. 06/12/18 an evaluation today patient presents for follow-up concerning his right lower extremity ulceration. The medial aspect ulcer appears to be doing fairly well this time which is good news. He has a blister on the lateral aspect although I'm concerned this may actually be due to the fact that his wrap is sliding down. We seem to be having issues with getting this wrapped appropriately were it will stay in place. Fortunately there does not appear to be any signs of infection at this time which is good news. He is seen with his son present at this point during the visit. 06/16/18 on evaluation today patient appears to be doing rather well and in fact appears to be almost completely healed in regard to his lower extremity ulcer on the right. The wrap much better this week with [place] wrapping it and just keeping him in that same wrap until follow-up. For that reason we will discontinue home health services. Fortunately there does not appear to be the evidence of infection at this point. 06/23/18 on evaluation today  patient appears to be doing very well in regard to his ulcer which in fact appears to be completely healed. He has been tolerating the wraps without complication although he is definitely ready to be out of these considering how well things have done. Fortunately there is not appear to be any signs of infection at this time which is  great news. 08/26/2019 upon evaluation today patient appears to be doing somewhat poorly upon initial inspection here in our clinic for his current issue. This is a patient that I previously taken care of with regard to wounds on his legs. With that being said the issues that were having right now are actually separate from that he has a heel ulcer unfortunately and then subsequently also does have a wound in the right gluteal region that appears to be more of a skin tear. The patient does have a history of diabetes mellitus type 2, peripheral vascular disease, hypertension, and chronic kidney disease stage IV. He is also somewhat weak though he does like to walk around which is actually a good thing he does so slowly. He is currently at an assisted living facility at this point. 09/02/2019 upon evaluation today patient appears to be doing well with regard to the wound on his left heel as well as the right gluteal region. He has had the arterial studies I did look up the numbers and it appears that his TBI's were actually pretty good bilaterally measuring about 1 on the left and around 0.64 on the right. With that being said the final report is not here yet we should have that by next week. Nonetheless looking at the results of the imaging I feel like he has good arterial flow to be able to proceed with debridement today especially on this left heel. 09/09/2019 upon evaluation today patient's wounds actually appear to be showing signs of improvement. In the gluteal/sacral region he is showing signs of significant improvement and in fact I really do not see much that is exactly  open although there is a lot of moisture type breakdown that I do see at this point. Nonetheless I believe that the patient can potentially transition from a dressing to just using a barrier cream in this area. In regard to the heel that it does appear to be showing signs of improvement I think the Iodoflex is doing a good job. 09/16/2019 upon evaluation today patient appears to be doing well with regard to his heel ulcer. He has been tolerating the dressing changes without complication. Fortunately there is no signs of active infection at this point which is great news. There is some question about physical therapy whether he should be cutting back on this or not. With that being said it does not appear its causing any damage to his heel I think he can actually continue with therapy at this point. 09/23/2019 upon evaluation today patient appears to be doing excellent in regard to his heel ulcer. He has been tolerating the dressing changes without complication. Fortunately there is no signs of active infection at this time. No fevers, chills, nausea, vomiting, or diarrhea. 4/22; right heel ulcer at the tip of his heel. Raised edges nonviable surface. We have been using Iodoflex dimensions of the wound improving oThe patient had a fall this week. He had a skin tear on his right upper lateral humeral area. We are asked to look at this today 10/07/2019 upon evaluation today patient appears to be doing better with regard to his heel I think we may be ready to switch to a collagen-based dressing here. In regard to the skin tear unfortunately the think this is good to have to have some of the skin removed it does not seem to be healing as appropriately as we would like. Fortunately there is no signs of active infection at this time. [name], [name] (  [id number]) Electronic Signature(s) Signed: 10/07/2019 9:45:33 AM By: Worthy Keeler [name] Entered By: Worthy Keeler on 10/07/2019 09:45:33 [name], [name]  ([id number]) -------------------------------------------------------------------------------- Physical Exam Details Patient Name: [name] Date of Service: 10/07/2019 8:45 AM Medical Record Number: [id number] Patient Account Number: [id number] Date of Birth/Sex: 1931-12-21 (87 y.o. M) Treating RN: Army Melia Primary Care Provider: [name] Other Clinician: Referring Provider: [name] Treating Provider/Extender: Melburn Hake, Ayyan Sites Weeks in Treatment: 6 Constitutional Well-nourished and well-hydrated in no acute distress. Respiratory normal breathing without difficulty. Psychiatric this patient is able to make decisions and demonstrates good insight into disease process. Alert and Oriented x 3. pleasant and cooperative. Notes Patient's wound bed currently at both locations did require some debridement today this was performed without complication and post debridement both areas appear to be doing much better. I think both are appropriate for a collagen-based dressing I would like to go ahead and initiate that as of today. Electronic Signature(s) Signed: 10/07/2019 9:45:51 AM By: Worthy Keeler [name] Entered By: Worthy Keeler on 10/07/2019 09:45:51 [name], [name] ([id number]) -------------------------------------------------------------------------------- Physician Orders Details Patient Name: [name] Date of Service: 10/07/2019 8:45 AM Medical Record Number: [id number] Patient Account Number: [id number] Date of Birth/Sex: 06-20-1931 (87 y.o. M) Treating RN: Army Melia Primary Care Provider: [name] Other Clinician: Referring Provider: [name] Treating Provider/Extender: Melburn Hake, Telisa Ohlsen Weeks in Treatment: 6 Verbal / Phone Orders: No Diagnosis Coding ICD-10 Coding Code Description E11.621 Type 2 diabetes mellitus with foot ulcer S40.811D Abrasion of right upper arm, subsequent encounter L89.620 Pressure ulcer of left heel, unstageable I73.89  Other specified peripheral vascular diseases I10 Essential (primary) hypertension N18.4 Chronic kidney disease, stage 4 (severe) Wound Cleansing Wound #2 Left Calcaneus o May Shower, gently pat wound dry prior to applying new dressing. Wound #4 Right Upper Arm o May Shower, gently pat wound dry prior to applying new dressing. Anesthetic (add to Medication List) Wound #2 Left Calcaneus o Topical Lidocaine 4% cream applied to wound bed prior to debridement (In Clinic Only). - in clinic only Wound #4 Right Upper Arm o Topical Lidocaine 4% cream applied to wound bed prior to debridement (In Clinic Only). - in clinic only Primary Wound Dressing Wound #2 Left Calcaneus o Collagen - moisten with hydrogel Wound #4 Right Upper Arm o Collagen - moisten with hydrogel Secondary Dressing Wound #2 Left Calcaneus o Boardered Foam Dressing Wound #4 Right Upper Arm o Conform/Kerlix Dressing Change Frequency Wound #2 Left Calcaneus o Change Dressing Monday, Wednesday, Friday Wound #4 Right Upper Arm o Change Dressing Monday, Wednesday, Friday Follow-up Appointments Wound #2 Left Calcaneus o Return Appointment in 1 week. Wound #4 Right Upper Arm o Return Appointment in 1 week. [name], [name] ([id number]) Edema Control Wound #2 Left Calcaneus o Elevate legs to the level of the heart and pump ankles as often as possible Off-Loading Wound #2 Left Calcaneus o Turn and reposition every 2 hours Wound #4 Right Upper Arm o Turn and reposition every 2 hours Home Health Wound #2 Left [place] Vis[facility] - [facility] o Home Health Nurse may visit PRN to address patientos wound care needs. o FACE TO FACE ENCOUNTER: MEDICARE and MEDICAID PATIENTS: I certify that this patient is under my care and that I had a face-to-face encounter that meets the physician face-to-face encounter requirements with this patient on this date. The encounter with the  patient was in whole or in part for the following MEDICAL CONDITION: (  primary reason for Home Healthcare) MEDICAL NECESSITY: I certify, that based on my findings, NURSING services are a medically necessary home health service. HOME BOUND STATUS: I certify that my clinical findings support that this patient is homebound (i.e., Due to illness or injury, pt requires aid of supportive devices such as crutches, cane, wheelchairs, walkers, the use of special transportation or the assistance of another person to leave their place of residence. There is a normal inability to leave the home and doing so requires considerable and taxing effort. Other absences are for medical reasons / religious services and are infrequent or of short duration when for other reasons). o If current dressing causes regression in wound condition, may D/C ordered dressing product/s and apply Normal Saline Moist Dressing daily until next [place] / Other MD appointment. [place] of regression in wound condition at [phone]. o Please direct any NON-WOUND related issues/requests for orders to patient's Primary Care Physician Wound #4 Right Upper Arm o [place] Nurse may visit PRN to address patientos wound care needs. o FACE TO FACE ENCOUNTER: MEDICARE and MEDICAID PATIENTS: I certify that this patient is under my care and that I had a face-to-face encounter that meets the physician face-to-face encounter requirements with this patient on this date. The encounter with the patient was in whole or in part for the following MEDICAL CONDITION: (primary reason for [place]) MEDICAL NECESSITY: I certify, that based on my findings, NURSING services are a medically necessary home health service. HOME BOUND STATUS: I certify that my clinical findings support that this patient is homebound (i.e., Due to illness or injury, pt requires aid of  supportive devices such as crutches, cane, wheelchairs, walkers, the use of special transportation or the assistance of another person to leave their place of residence. There is a normal inability to leave the home and doing so requires considerable and taxing effort. Other absences are for medical reasons / religious services and are infrequent or of short duration when for other reasons). o If current dressing causes regression in wound condition, may D/C ordered dressing product/s and apply Normal Saline Moist Dressing daily until next [place] / Other MD appointment. [place] of regression in wound condition at [phone]. o Please direct any NON-WOUND related issues/requests for orders to patient's Primary Care Physician Electronic Signature(s) Signed: 10/07/2019 12:47:05 PM By: Army Melia Signed: 10/07/2019 5:56:59 PM By: Worthy Keeler [name] Entered By: Army Melia on 10/07/2019 09:47:42 [name], [name] ([id number]) -------------------------------------------------------------------------------- Problem List Details Patient Name: [name] Date of Service: 10/07/2019 8:45 AM Medical Record Number: [id number] Patient Account Number: [id number] Date of Birth/Sex: 1932-02-28 (86 y.o. M) Treating RN: Army Melia Primary Care Provider: [name] Other Clinician: Referring Provider: [name] Treating Provider/Extender: Melburn Hake, Natasia Sanko Weeks in Treatment: 6 Active Problems ICD-10 Encounter Code Description Active Date MDM Diagnosis E11.621 Type 2 diabetes mellitus with foot ulcer 08/26/2019 No Yes S40.811D Abrasion of right upper arm, subsequent encounter 09/30/2019 No Yes L89.620 Pressure ulcer of left heel, unstageable 08/26/2019 No Yes I73.89 Other specified peripheral vascular diseases 08/26/2019 No Yes I10 Essential (primary) hypertension 08/26/2019 No Yes N18.4 Chronic kidney disease, stage 4 (severe) 08/26/2019 No Yes Inactive  Problems ICD-10 Code Description Active Date Inactive Date S31.819A Unspecified open wound of right buttock, initial encounter 08/26/2019 08/26/2019 Resolved Problems Electronic Signature(s) Signed: 10/07/2019 9:10:23 AM By: Worthy Keeler [name] Entered By: Worthy Keeler  on 10/07/2019 09:10:22 [name], [name] ([id number]) -------------------------------------------------------------------------------- Progress Note Details Patient Name: [name], [name] Date of Service: 10/07/2019 8:45 AM Medical Record Number: [id number] Patient Account Number: [id number] Date of Birth/Sex: 01/30/1932 (86 y.o. M) Treating RN: Army Melia Primary Care Provider: [name] Other Clinician: Referring Provider: [name] Treating Provider/Extender: Melburn Hake, Janeen Watson Weeks in Treatment: 6 Subjective Chief Complaint Information obtained from Patient Left heel ulcer History of Present Illness (HPI) 05/19/18 on evaluation today patient presents for initial evaluation and clinic concerning issues that he has been having with his right medial lower leg ulcer. The patient has actually been seen in the emergency department for cellulitis he was admitted at [facility] on 04/19/18 discharged 04/21/18. He was noted to have proof of vascular disease and did undergo an angiogram on 04/20/18 by [name]. 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 Subsequently Dr. dew following intervention noted that the patient had depending on the location 20-40% residual stenosis noted and significant improvement in the overall vascular flow. Obviously this is great news. With that being said the patient's wound he states does seem to be doing some better compared to previous. Upon inspection indeed it appears that he has some epithelialization. Overall I do not see any evidence of significant infection which is great  news. No fevers chills noted 05/26/18 on evaluation today patient's wound actually appears to be doing much better at this time. With that being said I do think the [facility] Dressing a be sticking according to my nurse and this may be causing some issues as well. I'm gonna suggest at this point that we likely add mepitel underneath the [facility] Dressing help prevent this. Other than that everything seems to be doing excellent. 06/02/18 on evaluation today patient actually appears to be doing decently well in regard to his right lower extremity ulcer. This seems to show signs of good improvement which is excellent news. With that being said he did see vascular the good news is no further intervention is recommended nor necessary at this point. Actually placed in an [employer] and sent orders to home health which in the end it led to her switching the patient from the three layer compression wrap of the right lower extremity to an [employer] wrap. I feel like he did better in the three layer compression. With that being said the patient currently shows no signs of worsening the mepitel did help prevent any sticking to the wound bed which is great news. 06/12/18 an evaluation today patient presents for follow-up concerning his right lower extremity ulceration. The medial aspect ulcer appears to be doing fairly well this time which is good news. He has a blister on the lateral aspect although I'm concerned this may actually be due to the fact that his wrap is sliding down. We seem to be having issues with getting this wrapped appropriately were it will stay in place. Fortunately there does not appear to be any signs of infection at this time which is good news. He is seen with his son present at this point during the visit. 06/16/18 on evaluation today patient appears to be doing rather well and in fact appears to be almost completely healed in regard to his lower extremity ulcer on the right. The  wrap much better this week with [place] wrapping it and just keeping him in that  same wrap until follow-up. For that reason we will discontinue home health services. Fortunately there does not appear to be the evidence of infection at this point. 06/23/18 on evaluation today patient appears to be doing very well in regard to his ulcer which in fact appears to be completely healed. He has been tolerating the wraps without complication although he is definitely ready to be out of these considering how well things have done. Fortunately there is not appear to be any signs of infection at this time which is great news. 08/26/2019 upon evaluation today patient appears to be doing somewhat poorly upon initial inspection here in our clinic for his current issue. This is a patient that I previously taken care of with regard to wounds on his legs. With that being said the issues that were having right now are actually separate from that he has a heel ulcer unfortunately and then subsequently also does have a wound in the right gluteal region that appears to be more of a skin tear. The patient does have a history of diabetes mellitus type 2, peripheral vascular disease, hypertension, and chronic kidney disease stage IV. He is also somewhat weak though he does like to walk around which is actually a good thing he does so slowly. He is currently at an assisted living facility at this point. 09/02/2019 upon evaluation today patient appears to be doing well with regard to the wound on his left heel as well as the right gluteal region. He has had the arterial studies I did look up the numbers and it appears that his TBI's were actually pretty good bilaterally measuring about 1 on the left and around 0.64 on the right. With that being said the final report is not here yet we should have that by next week. Nonetheless looking at the results of the imaging I feel like he has good arterial flow to be able to proceed with  debridement today especially on this left heel. 09/09/2019 upon evaluation today patient's wounds actually appear to be showing signs of improvement. In the gluteal/sacral region he is showing signs of significant improvement and in fact I really do not see much that is exactly open although there is a lot of moisture type breakdown that I do see at this point. Nonetheless I believe that the patient can potentially transition from a dressing to just using a barrier cream in this area. In regard to the heel that it does appear to be showing signs of improvement I think the Iodoflex is doing a good job. 09/16/2019 upon evaluation today patient appears to be doing well with regard to his heel ulcer. He has been tolerating the dressing changes without complication. Fortunately there is no signs of active infection at this point which is great news. There is some question about physical therapy whether he should be cutting back on this or not. With that being said it does not appear its causing any damage to his heel I think he can actually continue with therapy at this point. 09/23/2019 upon evaluation today patient appears to be doing excellent in regard to his heel ulcer. He has been tolerating the dressing changes without complication. Fortunately there is no signs of active infection at this time. No fevers, chills, nausea, vomiting, or diarrhea. 4/22; right heel ulcer at the tip of his heel. Raised edges nonviable surface. We have been using Iodoflex dimensions of the wound improving The patient had a fall this week. He had a skin tear  on his right upper lateral humeral area. We are asked to look at this today [name], [name]. ([id number]) 10/07/2019 upon evaluation today patient appears to be doing better with regard to his heel I think we may be ready to switch to a collagen-based dressing here. In regard to the skin tear unfortunately the think this is good to have to have some of the skin removed it does  not seem to be healing as appropriately as we would like. Fortunately there is no signs of active infection at this time. Objective Constitutional Well-nourished and well-hydrated in no acute distress. Vitals Time Taken: 9:09 AM, Height: 68 in, Weight: 145 lbs, BMI: 22, Temperature: 97.7 F, Pulse: 84 bpm, Respiratory Rate: 16 breaths/min, Blood Pressure: 150/54 mmHg. Respiratory normal breathing without difficulty. Psychiatric this patient is able to make decisions and demonstrates good insight into disease process. Alert and Oriented x 3. pleasant and cooperative. General Notes: Patient's wound bed currently at both locations did require some debridement today this was performed without complication and post debridement both areas appear to be doing much better. I think both are appropriate for a collagen-based dressing I would like to go ahead and initiate that as of today. Integumentary (Hair, Skin) Wound #2 status is Open. Original cause of wound was Gradually Appeared. The wound is located on the Left Calcaneus. The wound measures 0.8cm length x 1.2cm width x 0.4cm depth; 0.754cm^2 area and 0.302cm^3 volume. There is Fat Layer (Subcutaneous Tissue) Exposed exposed. There is a medium amount of serous drainage noted. The wound margin is flat and intact. There is medium (34-66%) pink granulation within the wound bed. There is a medium (34-66%) amount of necrotic tissue within the wound bed including Adherent Slough. Wound #4 status is Open. Original cause of wound was Trauma. The wound is located on the Right Upper Arm. The wound measures 2.5cm length x 3cm width x 0.1cm depth; 5.89cm^2 area and 0.589cm^3 volume. There is Fat Layer (Subcutaneous Tissue) Exposed exposed. There is a medium amount of serous drainage noted. The wound margin is flat and intact. There is large (67-100%) pink granulation within the wound bed. There is a small (1-33%) amount of necrotic tissue within the wound bed  including Adherent Slough. Assessment Active Problems ICD-10 Type 2 diabetes mellitus with foot ulcer Abrasion of right upper arm, subsequent encounter Pressure ulcer of left heel, unstageable Other specified peripheral vascular diseases Essential (primary) hypertension Chronic kidney disease, stage 4 (severe) Procedures Wound #2 Pre-procedure diagnosis of Wound #2 is a Pressure Ulcer located on the Left Calcaneus . There was a Excisional Skin/Subcutaneous Tissue Debridement with a total area of 0.96 sq cm performed by [name] III, Mariame Rybolt E., [name]. With the following instrument(s): Forceps, and Scissors to remove Viable and Non-Viable tissue/material. Material removed includes Subcutaneous Tissue, Slough, and Skin: Dermis after achieving [name] control using Lidocaine. No specimens were taken. A time out was conducted at 09:40, prior to the start of the procedure. A Minimum amount of bleeding was controlled with Pressure. The procedure was tolerated well with a [name] level of 0 throughout and a [name] level of 0 following the procedure. Post Debridement Measurements: 0.8cm length x 1.2cm width x 0.5cm depth; 0.377cm^3 volume. Post debridement Stage noted as Category/Stage III. Character of Wound/Ulcer Post Debridement is improved. Post procedure Diagnosis Wound #2: Same as Pre-Procedure [place], [name] R. ([id number]) Wound #4 Pre-procedure diagnosis of Wound #4 is a Skin Tear located on the Right Upper Arm . There was a Excisional Skin/Subcutaneous  Tissue Debridement with a total area of 7.5 sq cm performed by [name] III, Tekesha Almgren E., [name]. With the following instrument(s): Forceps, and Scissors to remove Viable and Non-Viable tissue/material. Material removed includes Subcutaneous Tissue and Skin: Dermis and after achieving [name] control using Lidocaine. A time out was conducted at 09:36, prior to the start of the procedure. A Minimum amount of bleeding was controlled with Pressure. The procedure was  tolerated well. Post Debridement Measurements: 2.5cm length x 3cm width x 0.1cm depth; 0.589cm^3 volume. Character of Wound/Ulcer Post Debridement is stable. Post procedure Diagnosis Wound #4: Same as Pre-Procedure Plan Wound Cleansing: Wound #2 Left Calcaneus: May Shower, gently pat wound dry prior to applying new dressing. Wound #4 Right Upper Arm: May Shower, gently pat wound dry prior to applying new dressing. Anesthetic (add to Medication List): Wound #2 Left Calcaneus: Topical Lidocaine 4% cream applied to wound bed prior to debridement (In Clinic Only). - in clinic only Wound #4 Right Upper Arm: Topical Lidocaine 4% cream applied to wound bed prior to debridement (In Clinic Only). - in clinic only Primary Wound Dressing: Wound #2 Left Calcaneus: Collagen - moisten with hydrogel Wound #4 Right Upper Arm: Collagen - moisten with hydrogel Secondary Dressing: Wound #2 Left Calcaneus: Boardered Foam Dressing Wound #4 Right Upper Arm: Conform/Kerlix Dressing Change Frequency: Wound #2 Left Calcaneus: Change Dressing Monday, Wednesday, Friday Wound #4 Right Upper Arm: Change Dressing Monday, Wednesday, Friday Follow-up Appointments: Wound #2 Left Calcaneus: Return Appointment in 1 week. Wound #4 Right Upper Arm: Return Appointment in 1 week. Edema Control: Wound #2 Left Calcaneus: Elevate legs to the level of the heart and pump ankles as often as possible Off-Loading: Wound #2 Left Calcaneus: Turn and reposition every 2 hours Wound #4 Right Upper Arm: Turn and reposition every 2 hours Home Health: Wound #2 Left Calcaneus: Continue Home Health Visits - [facility] Nurse may visit PRN to address patient s wound care needs. FACE TO FACE ENCOUNTER: MEDICARE and MEDICAID PATIENTS: I certify that this patient is under my care and that I had a face-to-face encounter that meets the physician face-to-face encounter requirements with this patient on this date. The  encounter with the patient was in whole or in part for the following MEDICAL CONDITION: (primary reason for [place]) MEDICAL NECESSITY: I certify, that based on my findings, NURSING services are a medically necessary home health service. HOME BOUND STATUS: I certify that my clinical findings support that this patient is homebound (i.e., Due to illness or injury, pt requires aid of supportive devices such as crutches, cane, wheelchairs, walkers, the use of special transportation or the assistance of another person to leave their place of residence. There is a normal inability to leave the home and doing so requires considerable and taxing effort. Other absences are for medical reasons / religious services and are infrequent or of short duration when for other reasons). If current dressing causes regression in wound condition, may D/C ordered dressing product/s and apply Normal Saline Moist Dressing daily until next [place] / Other MD appointment. [place] of regression in wound condition at [phone]. Please direct any NON-WOUND related issues/requests for orders to patient's Primary Care Physician Wound #4 Right Upper Arm: [place] Nurse may visit PRN to address patient s wound care needs. FACE TO FACE ENCOUNTER: MEDICARE and MEDICAID PATIENTS: I certify that this patient is under my care and that I had a face-to-face encounter that  meets the physician face-to-face encounter requirements with this patient on this date. The encounter with the patient was in whole or in part for the following MEDICAL CONDITION: (primary reason for [place]) MEDICAL NECESSITY: I certify, that based on my findings, NURSING services are a medically necessary home health service. HOME BOUND STATUS: I certify that my clinical findings support that DELPHIN, FUNES ([id number]) this patient is homebound (i.e., Due to illness or  injury, pt requires aid of supportive devices such as crutches, cane, wheelchairs, walkers, the use of special transportation or the assistance of another person to leave their place of residence. There is a normal inability to leave the home and doing so requires considerable and taxing effort. Other absences are for medical reasons / religious services and are infrequent or of short duration when for other reasons). If current dressing causes regression in wound condition, may D/C ordered dressing product/s and apply Normal Saline Moist Dressing daily until next [place] / Other MD appointment. [place] of regression in wound condition at [phone]. Please direct any NON-WOUND related issues/requests for orders to patient's Primary Care Physician 1. I am going to suggest currently that we go ahead and initiate plain collagen as the treatment of choice for both the skin tear on the arm as well as the heel ulcer. I think both will benefit from this currently. 2. I am going to recommend that we continue with appropriate offloading in regard to the heel. Obviously the more pressure that we can keep off of the area the better. 3. With regard to elevation I think the patient still needs to try to elevate his legs much as possible to help with edema control as well. That was discussed today. We will see patient back for reevaluation in 1 week here in the clinic. If anything worsens or changes patient will contact our office for additional recommendations. Electronic Signature(s) Signed: 10/07/2019 9:50:55 AM By: Worthy Keeler [name] Previous Signature: 10/07/2019 9:47:04 AM Version By: Worthy Keeler [name] Entered By: Worthy Keeler on 10/07/2019 09:50:55 Mccauslin, [name] ([id number]) -------------------------------------------------------------------------------- SuperBill Details Patient Name: [name] Date of Service: 10/07/2019 Medical Record Number:  [id number] Patient Account Number: [id number] Date of Birth/Sex: [birth year] (87 y.o. M) Treating RN: Army Melia Primary Care Provider: [name] Other Clinician: Referring Provider: [name] Treating Provider/Extender: Melburn Hake, Arieanna Pressey Weeks in Treatment: 6 Diagnosis Coding ICD-10 Codes Code Description E11.621 Type 2 diabetes mellitus with foot ulcer S40.811D Abrasion of right upper arm, subsequent encounter L89.620 Pressure ulcer of left heel, unstageable I73.89 Other specified peripheral vascular diseases I10 Essential (primary) hypertension N18.4 Chronic kidney disease, stage 4 (severe) Facility Procedures CPT4 Code: [id number] Description: 27062 - DEB SUBQ TISSUE 20 SQ CM/< Modifier: Quantity: 1 CPT4 Code: Description: ICD-10 Diagnosis Description L89.620 Pressure ulcer of left heel, unstageable S40.811D Abrasion of right upper arm, subsequent encounter Modifier: Quantity: Physician Procedures CPT4 Code: 3762831 Description: 11042 - WC PHYS SUBQ TISS 20 SQ CM Modifier: Quantity: 1 CPT4 Code: Description: ICD-10 Diagnosis Description L89.620 Pressure ulcer of left heel, unstageable S40.811D Abrasion of right upper arm, subsequent encounter Modifier: Quantity: Electronic Signature(s) Signed: 10/07/2019 9:47:24 AM By: Worthy Keeler [name] Entered By: Worthy Keeler on 10/07/2019 09:47:23

## 2019-10-08 NOTE — Progress Notes (Signed)
[name], [name] ([id number]) Visit Report for 10/07/2019 Arrival Information Details Patient Name: [name], [name] Date of Service: 10/07/2019 8:45 AM Medical Record Number: [id number] Patient Account Number: [id number] Date of Birth/Sex: [birth year] (86 y.o. M) Treating RN: Army Melia Primary Care Ashaki Frosch: [name] Other Clinician: Referring Lashica Hannay: [name] Treating Areeb Corron/Extender: Melburn Hake, HOYT Weeks in Treatment: 6 Visit Information History Since Last Visit Added or deleted any medications: No Patient Arrived: Walker Any new allergies or adverse reactions: No Arrival Time: 09:08 Had a fall or experienced change in No Accompanied By: son activities of daily living that may affect Transfer Assistance: None risk of falls: Patient Identification Verified: Yes Signs or symptoms of abuse/neglect since last visito No Hospitalized since last visit: No Has Dressing in Place as Prescribed: Yes Pain Present Now: No Electronic Signature(s) Signed: 10/07/2019 12:47:05 PM By: Army Melia Entered By: Army Melia on 10/07/2019 09:09:08 Tedrick, [name] ([id number]) -------------------------------------------------------------------------------- Encounter Discharge Information Details Patient Name: [name] Date of Service: 10/07/2019 8:45 AM Medical Record Number: [id number] Patient Account Number: [id number] Date of Birth/Sex: [birth year] (87 y.o. M) Treating RN: Army Melia Primary Care Santia Labate: [name] Other Clinician: Referring Jaqulyn Chancellor: [name] Treating Laverta Harnisch/Extender: Melburn Hake, HOYT Weeks in Treatment: 6 Encounter Discharge Information Items Post Procedure Vitals Discharge Condition: Stable Temperature (F): 97.7 Ambulatory Status: Walker Pulse (bpm): 84 Discharge Destination: [place] Respiratory Rate (breaths/min): 16 Telephoned: No Blood Pressure (mmHg): 150/64 Orders Sent: Yes Transportation: Private Auto Accompanied  By: son Schedule Follow-up Appointment: No Clinical Summary of Care: Electronic Signature(s) Signed: 10/07/2019 12:47:05 PM By: Army Melia Entered By: Army Melia on 10/07/2019 09:49:02 Perezgarcia, [name] ([id number]) -------------------------------------------------------------------------------- Lower Extremity Assessment Details Patient Name: [name] Date of Service: 10/07/2019 8:45 AM Medical Record Number: [id number] Patient Account Number: [id number] Date of Birth/Sex: 02-27-1932 (87 y.o. M) Treating RN: Army Melia Primary Care Glena Pharris: [name] Other Clinician: Referring Jeffery Bachmeier: [name] Treating Kasidi Shanker/Extender: STONE III, HOYT Weeks in Treatment: 6 Edema Assessment Assessed: [Left: No] [Right: No] Edema: [Left: N] [Right: o] Vascular Assessment Pulses: Dorsalis Pedis Palpable: [Left:Yes] Electronic Signature(s) Signed: 10/07/2019 12:47:05 PM By: Army Melia Entered By: Army Melia on 10/07/2019 09:16:27 Nery, [name] ([id number]) -------------------------------------------------------------------------------- Multi Wound Chart Details Patient Name: [name] Date of Service: 10/07/2019 8:45 AM Medical Record Number: [id number] Patient Account Number: [id number] Date of Birth/Sex: 1931-07-09 (87 y.o. M) Treating RN: Army Melia Primary Care Odyssey Vasbinder: [name] Other Clinician: Referring Alfons Sulkowski: [name] Treating Kristan Brummitt/Extender: Melburn Hake, HOYT Weeks in Treatment: 6 Vital Signs Height(in): 67 Pulse(bpm): 45 Weight(lbs): 145 Blood Pressure(mmHg): 150/54 Body Mass Index(BMI): 22 Temperature(F): 97.7 Respiratory Rate(breaths/min): 16 Photos: [N/A:N/A] Wound Location: Left Calcaneus Right Upper Arm N/A Wounding Event: Gradually Appeared Trauma N/A Primary Etiology: Pressure Ulcer Skin Tear N/A Comorbid History: Cataracts, Hypertension, Peripheral Cataracts, Hypertension, Peripheral N/A Venous Disease, Type II Diabetes,  Venous Disease, Type II Diabetes, End Stage Renal Disease End Stage Renal Disease Date Acquired: 07/26/2019 09/26/2019 N/A Weeks of Treatment: 6 1 N/A Wound Status: Open Open N/A Measurements L x W x D (cm) 0.8x1.2x0.4 2.5x3x0.1 N/A Area (cm) : 0.754 5.89 N/A Volume (cm) : 0.302 0.589 N/A % Reduction in Area: 47.20% 15.20% N/A % Reduction in Volume: -111.20% 15.10% N/A Classification: Category/Stage III Full Thickness Without Exposed N/A Support Structures Exudate Amount: Medium Medium N/A Exudate Type: Serous Serous N/A Exudate Color: amber amber N/A Wound Margin: Flat and Intact Flat and Intact N/A Granulation Amount: Medium (34-66%) Large (67-100%) N/A Granulation  Quality: Pink Pink N/A Necrotic Amount: Medium (34-66%) Small (1-33%) N/A Exposed Structures: Fat Layer (Subcutaneous Tissue) Fat Layer (Subcutaneous Tissue) N/A Exposed: Yes Exposed: Yes Fascia: No Fascia: No Tendon: No Tendon: No Muscle: No Muscle: No Joint: No Joint: No Bone: No Bone: No Epithelialization: None None N/A Treatment Notes Electronic Signature(s) Signed: 10/07/2019 12:47:05 PM By: Army Melia Entered By: Army Melia on 10/07/2019 09:37:37 Tufaro, [name] ([id number]) -------------------------------------------------------------------------------- Multi-Disciplinary Care Plan Details Patient Name: [name] Date of Service: 10/07/2019 8:45 AM Medical Record Number: [id number] Patient Account Number: [id number] Date of Birth/Sex: August 22, 1931 (87 y.o. M) Treating RN: Army Melia Primary Care Shaquanta Harkless: [name] Other Clinician: Referring Taralyn Ferraiolo: [name] Treating Fedrick Cefalu/Extender: Melburn Hake, HOYT Weeks in Treatment: 6 Active Inactive Abuse / Safety / Falls / Self Care Management Nursing Diagnoses: Impaired physical mobility Goals: Patient/caregiver will verbalize understanding of skin care regimen Date Initiated: 08/26/2019 Target Resolution Date: 09/08/2019 Goal Status:  Active Interventions: Assess fall risk on admission and as needed Notes: Orientation to the Wound Care Program Nursing Diagnoses: Knowledge deficit related to the wound healing center program Goals: Patient/caregiver will verbalize understanding of the [facility] Program Date Initiated: 08/26/2019 Target Resolution Date: 09/08/2019 Goal Status: Active Interventions: Provide education on orientation to the wound center Notes: Pressure Nursing Diagnoses: Knowledge deficit related to causes and risk factors for pressure ulcer development Goals: Patient will remain free from development of additional pressure ulcers Date Initiated: 08/26/2019 Target Resolution Date: 09/08/2019 Goal Status: Active Interventions: Assess: immobility, friction, shearing, incontinence upon admission and as needed Notes: Wound/Skin Impairment Nursing Diagnoses: Impaired tissue integrity Goals: Ulcer/skin breakdown will have a volume reduction of 30% by week 4 Date Initiated: 08/26/2019 Target Resolution Date: 09/26/2019 Goal Status: Active SANJAY, BROADFOOT ([id number]) Interventions: Assess patient/caregiver ability to obtain necessary supplies Treatment Activities: Skin care regimen initiated : 08/26/2019 Notes: Electronic Signature(s) Signed: 10/07/2019 12:47:05 PM By: Army Melia Entered By: Army Melia on 10/07/2019 09:35:59 Spitler, [name] ([id number]) -------------------------------------------------------------------------------- Pain Assessment Details Patient Name: [name] Date of Service: 10/07/2019 8:45 AM Medical Record Number: [id number] Patient Account Number: [id number] Date of Birth/Sex: Mar 26, 1932 (87 y.o. M) Treating RN: Army Melia Primary Care Mackensey Bolte: [name] Other Clinician: Referring Rhetta Cleek: [name] Treating Maricruz Lucero/Extender: Melburn Hake, HOYT Weeks in Treatment: 6 Active Problems Location of Pain Severity and Description of Pain Patient Has  Paino No Site Locations Pain Management and Medication Current Pain Management: Electronic Signature(s) Signed: 10/07/2019 12:47:05 PM By: Army Melia Entered By: Army Melia on 10/07/2019 09:09:46 [place]worth, [name] ([id number]) -------------------------------------------------------------------------------- Patient/Caregiver Education Details Patient Name: [name] Date of Service: 10/07/2019 8:45 AM Medical Record Number: [id number] Patient Account Number: [id number] Date of Birth/Gender: 05-Dec-1931 (86 y.o. M) Treating RN: Army Melia Primary Care Physician: [name] Other Clinician: Referring Physician: [name] Treating Physician/Extender: Sharalyn Ink in Treatment: 6 Education Assessment Education Provided To: Patient Education Topics Provided Wound/Skin Impairment: Handouts: Caring for Your Ulcer Methods: Demonstration, Explain/Verbal Responses: State content correctly Electronic Signature(s) Signed: 10/07/2019 12:47:05 PM By: Army Melia Entered By: Army Melia on 10/07/2019 09:48:04 Bess, [name] ([id number]) -------------------------------------------------------------------------------- Wound Assessment Details Patient Name: [name] Date of Service: 10/07/2019 8:45 AM Medical Record Number: [id number] Patient Account Number: [id number] Date of Birth/Sex: [birth year] (87 y.o. M) Treating RN: Army Melia Primary Care Wynter Grave: [name] Other Clinician: Referring Retta Pitcher: [name] Treating Algernon Mundie/Extender: Melburn Hake, HOYT Weeks in Treatment: 6 Wound Status Wound Number: 2 Primary Pressure Ulcer Etiology: Wound Location:  Left Calcaneus Wound Open Wounding Event: Gradually Appeared Status: Date Acquired: 07/26/2019 Comorbid Cataracts, Hypertension, Peripheral Venous Disease, Weeks Of Treatment: 6 History: Type II Diabetes, End Stage Renal Disease Clustered Wound: No Photos Wound Measurements Length: (cm) 0.8 %  Red Width: (cm) 1.2 % Red Depth: (cm) 0.4 Epith Area: (cm) 0.754 Volume: (cm) 0.302 uction in Area: 47.2% uction in Volume: -111.2% elialization: None Wound Description Classification: Category/Stage III Foul Wound Margin: Flat and Intact Slou Exudate Amount: Medium Exudate Type: Serous Exudate Color: amber Odor After Cleansing: No gh/Fibrino Yes Wound Bed Granulation Amount: Medium (34-66%) Exposed Structure Granulation Quality: Pink Fascia Exposed: No Necrotic Amount: Medium (34-66%) Fat Layer (Subcutaneous Tissue) Exposed: Yes Necrotic Quality: Adherent Slough Tendon Exposed: No Muscle Exposed: No Joint Exposed: No Bone Exposed: No Treatment Notes Wound #2 (Left Calcaneus) Notes right arm- collagen adaptic, conform left heel- collagen BFD Electronic Signature(s) ISAIS, KLIPFEL ([id number]) Signed: 10/07/2019 12:47:05 PM By: Army Melia Entered By: Army Melia on 10/07/2019 09:15:19 Caligiuri, [name] ([id number]) -------------------------------------------------------------------------------- Wound Assessment Details Patient Name: [name] Date of Service: 10/07/2019 8:45 AM Medical Record Number: [id number] Patient Account Number: [id number] Date of Birth/Sex: 1932/03/03 (86 y.o. M) Treating RN: Army Melia Primary Care Kaliana Albino: [name] Other Clinician: Referring Korena Nass: [name] Treating Rondi Ivy/Extender: Melburn Hake, HOYT Weeks in Treatment: 6 Wound Status Wound Number: 4 Primary Skin Tear Etiology: Wound Location: Right Upper Arm Wound Open Wounding Event: Trauma Status: Date Acquired: 09/26/2019 Comorbid Cataracts, Hypertension, Peripheral Venous Disease, Weeks Of Treatment: 1 History: Type II Diabetes, End Stage Renal Disease Clustered Wound: No Photos Wound Measurements Length: (cm) 2.5 Width: (cm) 3 Depth: (cm) 0.1 Area: (cm) 5.89 Volume: (cm) 0.589 % Reduction in Area: 15.2% % Reduction in Volume:  15.1% Epithelialization: None Wound Description Classification: Full Thickness Without Exposed Support Structu Wound Margin: Flat and Intact Exudate Amount: Medium Exudate Type: Serous Exudate Color: amber res Foul Odor After Cleansing: No Slough/Fibrino Yes Wound Bed Granulation Amount: Large (67-100%) Exposed Structure Granulation Quality: Pink Fascia Exposed: No Necrotic Amount: Small (1-33%) Fat Layer (Subcutaneous Tissue) Exposed: Yes Necrotic Quality: Adherent Slough Tendon Exposed: No Muscle Exposed: No Joint Exposed: No Bone Exposed: No Treatment Notes Wound #4 (Right Upper Arm) Notes right arm- collagen adaptic, conform left heel- collagen BFD Electronic Signature(s) TYJON, BOWEN ([id number]) Signed: 10/07/2019 12:47:05 PM By: Army Melia Entered By: Army Melia on 10/07/2019 09:16:08 Nieland, [name] ([id number]) -------------------------------------------------------------------------------- Vitals Details Patient Name: [name] Date of Service: 10/07/2019 8:45 AM Medical Record Number: [id number] Patient Account Number: [id number] Date of Birth/Sex: [birth year] (86 y.o. M) Treating RN: Army Melia Primary Care Merida Alcantar: [name] Other Clinician: Referring Persia Lintner: [name] Treating Vanya Carberry/Extender: Melburn Hake, HOYT Weeks in Treatment: 6 Vital Signs Time Taken: 09:09 Temperature (F): 97.7 Height (in): 68 Pulse (bpm): 84 Weight (lbs): 145 Respiratory Rate (breaths/min): 16 Body Mass Index (BMI): 22 Blood Pressure (mmHg): 150/54 Reference Range: 80 - 120 mg / dl Electronic Signature(s) Signed: 10/07/2019 12:47:05 PM By: Army Melia Entered By: Army Melia on 10/07/2019 09:09:41

## 2019-10-14 ENCOUNTER — Encounter: Payer: Medicare Other | Attending: Physician Assistant | Admitting: Physician Assistant

## 2019-10-14 ENCOUNTER — Other Ambulatory Visit: Payer: Self-pay

## 2019-10-14 DIAGNOSIS — S40811A Abrasion of right upper arm, initial encounter: Secondary | ICD-10-CM | POA: Diagnosis not present

## 2019-10-14 DIAGNOSIS — E11621 Type 2 diabetes mellitus with foot ulcer: Secondary | ICD-10-CM | POA: Insufficient documentation

## 2019-10-14 DIAGNOSIS — L89620 Pressure ulcer of left heel, unstageable: Secondary | ICD-10-CM | POA: Insufficient documentation

## 2019-10-14 DIAGNOSIS — W19XXXA Unspecified fall, initial encounter: Secondary | ICD-10-CM | POA: Insufficient documentation

## 2019-10-14 DIAGNOSIS — E1151 Type 2 diabetes mellitus with diabetic peripheral angiopathy without gangrene: Secondary | ICD-10-CM | POA: Diagnosis not present

## 2019-10-14 DIAGNOSIS — E1122 Type 2 diabetes mellitus with diabetic chronic kidney disease: Secondary | ICD-10-CM | POA: Insufficient documentation

## 2019-10-14 DIAGNOSIS — I120 Hypertensive chronic kidney disease with stage 5 chronic kidney disease or end stage renal disease: Secondary | ICD-10-CM | POA: Diagnosis not present

## 2019-10-14 DIAGNOSIS — N186 End stage renal disease: Secondary | ICD-10-CM | POA: Diagnosis not present

## 2019-10-14 NOTE — Progress Notes (Signed)
ADELBERT, GASPARD ([id number]) Visit Report for 10/14/2019 Arrival Information Details Patient Name: TANK, DIFIORE Date of Service: 10/14/2019 12:30 PM Medical Record Number: [id number] Patient Account Number: [id number] Date of Birth/Sex: Dec 02, 1931 (86 y.o. M) Treating RN: Army Melia Primary Care Tiye Huwe: [name] Other Clinician: Referring Hayk Divis: [name] Treating Jebidiah Baggerly/Extender: Melburn Hake, HOYT Weeks in Treatment: 7 Visit Information History Since Last Visit Added or deleted any medications: No Patient Arrived: Walker Any new allergies or adverse reactions: No Arrival Time: 12:37 Had a fall or experienced change in No Accompanied By: daughter activities of daily living that may affect Transfer Assistance: None risk of falls: Patient Identification Verified: Yes Signs or symptoms of abuse/neglect since last visito No Secondary Verification Process Completed: Yes Hospitalized since last visit: No Implantable device outside of the clinic excluding No cellular tissue based products placed in the center since last visit: Has Dressing in Place as Prescribed: Yes Pain Present Now: No Electronic Signature(s) Signed: 10/14/2019 4:30:59 PM By: Lorine Bears RCP, RRT, CHT Entered By: Lorine Bears on 10/14/2019 12:40:27 DWAYN, MORAVEK ([id number]) -------------------------------------------------------------------------------- Encounter Discharge Information Details Patient Name: [name] Date of Service: 10/14/2019 12:30 PM Medical Record Number: [id number] Patient Account Number: [id number] Date of Birth/Sex: 1932-02-25 (87 y.o. M) Treating RN: Army Melia Primary Care Carmin Alvidrez: [name] Other Clinician: Referring Laurna Shetley: [name] Treating Koral Thaden/Extender: Melburn Hake, HOYT Weeks in Treatment: 7 Encounter Discharge Information Items Post Procedure Vitals Discharge Condition: Stable Temperature (F): 97.6 Ambulatory  Status: Walker Pulse (bpm): 79 Discharge Destination: [place] Respiratory Rate (breaths/min): 16 Telephoned: No Blood Pressure (mmHg): 155/63 Orders Sent: Yes Transportation: Private Auto Accompanied By: daughter Schedule Follow-up Appointment: No Clinical Summary of Care: Electronic Signature(s) Signed: 10/14/2019 3:08:17 PM By: Army Melia Entered By: Army Melia on 10/14/2019 13:10:41 Orbach, [name] ([id number]) -------------------------------------------------------------------------------- Lower Extremity Assessment Details Patient Name: [name] Date of Service: 10/14/2019 12:30 PM Medical Record Number: [id number] Patient Account Number: [id number] Date of Birth/Sex: [birth year] (87 y.o. M) Treating RN: Montey Hora Primary Care Krishauna Schatzman: [name] Other Clinician: Referring Stefan Markarian: [name] Treating Olla Delancey/Extender: STONE III, HOYT Weeks in Treatment: 7 Edema Assessment Assessed: [Left: No] [Right: No] Edema: [Left: N] [Right: o] Vascular Assessment Pulses: Dorsalis Pedis Palpable: [Left:Yes] Electronic Signature(s) Signed: 10/14/2019 4:19:43 PM By: Montey Hora Entered By: Montey Hora on 10/14/2019 12:48:25 Rusher, [name] ([id number]) -------------------------------------------------------------------------------- Multi Wound Chart Details Patient Name: [name] Date of Service: 10/14/2019 12:30 PM Medical Record Number: [id number] Patient Account Number: [id number] Date of Birth/Sex: 1931-10-30 (87 y.o. M) Treating RN: Army Melia Primary Care Ronasia Isola: [name] Other Clinician: Referring Orlondo Holycross: [name] Treating Cletus Mehlhoff/Extender: Melburn Hake, HOYT Weeks in Treatment: 7 Vital Signs Height(in): 80 Pulse(bpm): 34 Weight(lbs): 145 Blood Pressure(mmHg): 155/63 Body Mass Index(BMI): 22 Temperature(F): 97.6 Respiratory Rate(breaths/min): 16 Photos: [N/A:N/A] Wound Location: Left Calcaneus Right Upper Arm  N/A Wounding Event: Gradually Appeared Trauma N/A Primary Etiology: Pressure Ulcer Skin Tear N/A Comorbid History: Cataracts, Hypertension, Peripheral Cataracts, Hypertension, Peripheral N/A Venous Disease, Type II Diabetes, Venous Disease, Type II Diabetes, End Stage Renal Disease End Stage Renal Disease Date Acquired: 07/26/2019 09/26/2019 N/A Weeks of Treatment: 7 2 N/A Wound Status: Open Open N/A Measurements L x W x D (cm) 0.6x1x0.3 1.2x2x0.1 N/A Area (cm) : 0.471 1.885 N/A Volume (cm) : 0.141 0.188 N/A % Reduction in Area: 67.00% 72.90% N/A % Reduction in Volume: 1.40% 72.90% N/A Classification: Category/Stage III Full Thickness Without Exposed N/A Support Structures Exudate Amount: Medium  Medium N/A Exudate Type: Serous Serous N/A Exudate Color: amber amber N/A Wound Margin: Flat and Intact Flat and Intact N/A Granulation Amount: Large (67-100%) Medium (34-66%) N/A Granulation Quality: Pink Pink N/A Necrotic Amount: Small (1-33%) Medium (34-66%) N/A Necrotic Tissue: Adherent Slough Eschar N/A Exposed Structures: Fat Layer (Subcutaneous Tissue) Fat Layer (Subcutaneous Tissue) N/A Exposed: Yes Exposed: Yes Fascia: No Fascia: No Tendon: No Tendon: No Muscle: No Muscle: No Joint: No Joint: No Bone: No Bone: No Epithelialization: None None N/A Treatment Notes Electronic Signature(s) Signed: 10/14/2019 3:08:17 PM By: Army Melia Entered By: Army Melia on 10/14/2019 13:02:26 Frazzini, [name] ([id number]) Arey, [name] ([id number]) -------------------------------------------------------------------------------- Multi-Disciplinary Care Plan Details Patient Name: [name] Date of Service: 10/14/2019 12:30 PM Medical Record Number: [id number] Patient Account Number: [id number] Date of Birth/Sex: 11/04/1931 (86 y.o. M) Treating RN: Army Melia Primary Care Latice Waitman: [name] Other Clinician: Referring Gowri Suchan: [name] Treating Sherwood Castilla/Extender: Melburn Hake, HOYT Weeks in Treatment: 7 Active Inactive Abuse / Safety / Falls / Self Care Management Nursing Diagnoses: Impaired physical mobility Goals: Patient/caregiver will verbalize understanding of skin care regimen Date Initiated: 08/26/2019 Target Resolution Date: 09/08/2019 Goal Status: Active Interventions: Assess fall risk on admission and as needed Notes: Orientation to the Wound Care Program Nursing Diagnoses: Knowledge deficit related to the wound healing center program Goals: Patient/caregiver will verbalize understanding of the [facility] Date Initiated: 08/26/2019 Target Resolution Date: 09/08/2019 Goal Status: Active Interventions: Provide education on orientation to the wound center Notes: Pressure Nursing Diagnoses: Knowledge deficit related to causes and risk factors for pressure ulcer development Goals: Patient will remain free from development of additional pressure ulcers Date Initiated: 08/26/2019 Target Resolution Date: 09/08/2019 Goal Status: Active Interventions: Assess: immobility, friction, shearing, incontinence upon admission and as needed Notes: Wound/Skin Impairment Nursing Diagnoses: Impaired tissue integrity Goals: Ulcer/skin breakdown will have a volume reduction of 30% by week 4 Date Initiated: 08/26/2019 Target Resolution Date: 09/26/2019 Goal Status: Active TOBIE, HELLEN ([id number]) Interventions: Assess patient/caregiver ability to obtain necessary supplies Treatment Activities: Skin care regimen initiated : 08/26/2019 Notes: Electronic Signature(s) Signed: 10/14/2019 3:08:17 PM By: Army Melia Entered By: Army Melia on 10/14/2019 13:02:18 Hixon, [name] ([id number]) -------------------------------------------------------------------------------- Pain Assessment Details Patient Name: [name] Date of Service: 10/14/2019 12:30 PM Medical Record Number: [id number] Patient Account Number: [id number] Date of  Birth/Sex: 11-May-1932 (86 y.o. M) Treating RN: Montey Hora Primary Care Zuma Hust: [name] Other Clinician: Referring Goddess Gebbia: [name] Treating Yazleemar Strassner/Extender: Melburn Hake, HOYT Weeks in Treatment: 7 Active Problems Location of Pain Severity and Description of Pain Patient Has Paino No Site Locations Pain Management and Medication Current Pain Management: Electronic Signature(s) Signed: 10/14/2019 4:19:43 PM By: Montey Hora Entered By: Montey Hora on 10/14/2019 12:48:16 Fell, [name] ([id number]) -------------------------------------------------------------------------------- Patient/Caregiver Education Details Patient Name: [name] Date of Service: 10/14/2019 12:30 PM Medical Record Number: [id number] Patient Account Number: [id number] Date of Birth/Gender: [birth year] (86 y.o. M) Treating RN: Army Melia Primary Care Physician: [name] Other Clinician: Referring Physician: [name] Treating Physician/Extender: Sharalyn Ink in Treatment: 7 Education Assessment Education Provided To: Patient Education Topics Provided Wound/Skin Impairment: Handouts: Caring for Your Ulcer Methods: Demonstration, Explain/Verbal Responses: State content correctly Electronic Signature(s) Signed: 10/14/2019 3:08:17 PM By: Army Melia Entered By: Army Melia on 10/14/2019 13:09:40 Degen, [name] ([id number]) -------------------------------------------------------------------------------- Wound Assessment Details Patient Name: [name] Date of Service: 10/14/2019 12:30 PM Medical Record Number: [id number] Patient Account Number: [id number] Date of Birth/Sex:  [birth year] (86 y.o. M) Treating RN: Montey Hora Primary Care Alby Schwabe: [name] Other Clinician: Referring Alfonso Carden: [name] Treating Gurtej Noyola/Extender: Melburn Hake, HOYT Weeks in Treatment: 7 Wound Status Wound Number: 2 Primary Pressure Ulcer Etiology: Wound Location: Left  Calcaneus Wound Open Wounding Event: Gradually Appeared Status: Date Acquired: 07/26/2019 Comorbid Cataracts, Hypertension, Peripheral Venous Disease, Weeks Of Treatment: 7 History: Type II Diabetes, End Stage Renal Disease Clustered Wound: No Photos Wound Measurements Length: (cm) 0.6 Width: (cm) 1 Depth: (cm) 0.3 Area: (cm) 0.471 Volume: (cm) 0.141 % Reduction in Area: 67% % Reduction in Volume: 1.4% Epithelialization: None Tunneling: No Undermining: No Wound Description Classification: Category/Stage III F Wound Margin: Flat and Intact S Exudate Amount: Medium Exudate Type: Serous Exudate Color: amber oul Odor After Cleansing: No lough/Fibrino Yes Wound Bed Granulation Amount: Large (67-100%) Exposed Structure Granulation Quality: Pink Fascia Exposed: No Necrotic Amount: Small (1-33%) Fat Layer (Subcutaneous Tissue) Exposed: Yes Necrotic Quality: Adherent Slough Tendon Exposed: No Muscle Exposed: No Joint Exposed: No Bone Exposed: No Treatment Notes Wound #2 (Left Calcaneus) Notes xeroform, conform to right arm, prisma, BFD to heel Electronic Signature(s) Signed: 10/14/2019 4:19:43 PM By: Barrett Henle, [name] ([id number]) Entered By: Montey Hora on 10/14/2019 12:53:26 Evola, [name] ([id number]) -------------------------------------------------------------------------------- Wound Assessment Details Patient Name: [name] Date of Service: 10/14/2019 12:30 PM Medical Record Number: [id number] Patient Account Number: [id number] Date of Birth/Sex: May 27, 1932 (86 y.o. M) Treating RN: Montey Hora Primary Care Eternity Dexter: [name] Other Clinician: Referring Jenelle Drennon: [name] Treating Taevon Aschoff/Extender: Melburn Hake, HOYT Weeks in Treatment: 7 Wound Status Wound Number: 4 Primary Skin Tear Etiology: Wound Location: Right Upper Arm Wound Open Wounding Event: Trauma Status: Date Acquired: 09/26/2019 Comorbid Cataracts, Hypertension,  Peripheral Venous Disease, Weeks Of Treatment: 2 History: Type II Diabetes, End Stage Renal Disease Clustered Wound: No Photos Wound Measurements Length: (cm) 1.2 Width: (cm) 2 Depth: (cm) 0.1 Area: (cm) 1.885 Volume: (cm) 0.188 % Reduction in Area: 72.9% % Reduction in Volume: 72.9% Epithelialization: None Tunneling: No Undermining: No Wound Description Classification: Full Thickness Without Exposed Support Structu Wound Margin: Flat and Intact Exudate Amount: Medium Exudate Type: Serous Exudate Color: amber res Foul Odor After Cleansing: No Slough/Fibrino Yes Wound Bed Granulation Amount: Medium (34-66%) Exposed Structure Granulation Quality: Pink Fascia Exposed: No Necrotic Amount: Medium (34-66%) Fat Layer (Subcutaneous Tissue) Exposed: Yes Necrotic Quality: Eschar Tendon Exposed: No Muscle Exposed: No Joint Exposed: No Bone Exposed: No Treatment Notes Wound #4 (Right Upper Arm) Notes xeroform, conform to right arm, prisma, BFD to heel Electronic Signature(s) Signed: 10/14/2019 4:19:43 PM By: Noreene Larsson ([id number]) Entered By: Montey Hora on 10/14/2019 12:54:16 Delpriore, [name] ([id number]) -------------------------------------------------------------------------------- Vitals Details Patient Name: [name] Date of Service: 10/14/2019 12:30 PM Medical Record Number: [id number] Patient Account Number: [id number] Date of Birth/Sex: [birth year] (87 y.o. M) Treating RN: Army Melia Primary Care Atlas Kuc: [name] Other Clinician: Referring Daanish Copes: [name] Treating Daneille Desilva/Extender: Melburn Hake, HOYT Weeks in Treatment: 7 Vital Signs Time Taken: 12:40 Temperature (F): 97.6 Height (in): 68 Pulse (bpm): 79 Weight (lbs): 145 Respiratory Rate (breaths/min): 16 Body Mass Index (BMI): 22 Blood Pressure (mmHg): 155/63 Reference Range: 80 - 120 mg / dl Electronic Signature(s) Signed: 10/14/2019 4:30:59 PM By: Lorine Bears RCP, RRT, CHT Entered By: Lorine Bears on 10/14/2019 12:43:05

## 2019-10-14 NOTE — Progress Notes (Addendum)
[name], [name] ([id number]) Visit Report for 10/14/2019 Chief Complaint Document Details Patient Name: [name], [name] Date of Service: 10/14/2019 12:30 PM Medical Record Number: [id number] Patient Account Number: [id number] Date of Birth/Sex: 1931-12-31 (86 y.o. M) Treating RN: Army Melia Primary Care Provider: [name] Other Clinician: Referring Provider: [name] Treating Provider/Extender: Melburn Hake, Arnez Stoneking Weeks in Treatment: 7 Information Obtained from: Patient Chief Complaint Left heel ulcer Electronic Signature(s) Signed: 10/14/2019 12:58:49 PM By: Worthy Keeler PA-C Entered By: Worthy Keeler on 10/14/2019 12:58:49 [name], [name] ([id number]) -------------------------------------------------------------------------------- Debridement Details Patient Name: [name] Date of Service: 10/14/2019 12:30 PM Medical Record Number: [id number] Patient Account Number: [id number] Date of Birth/Sex: Jun 17, 1931 (87 y.o. M) Treating RN: Army Melia Primary Care Provider: [name] Other Clinician: Referring Provider: [name] Treating Provider/Extender: Melburn Hake, Eryka Dolinger Weeks in Treatment: 7 Debridement Performed for Wound #2 Left Calcaneus Assessment: Performed By: Physician STONE III, Latash Nouri E., PA-C Debridement Type: Debridement Level of Consciousness (Pre- Awake and Alert procedure): Pre-procedure Verification/Time Out Yes - 13:05 Taken: Start Time: 13:06 Pain Control: Lidocaine Total Area Debrided (L x W): 0.6 (cm) x 1 (cm) = 0.6 (cm) Tissue and other material Viable, Non-Viable, Callus, Slough, Subcutaneous, Slough debrided: Level: Skin/Subcutaneous Tissue Debridement Description: Excisional Instrument: Curette Bleeding: Minimum Hemostasis Achieved: Pressure End Time: 13:07 Response to Treatment: Procedure was tolerated well Level of Consciousness (Post- Awake and Alert procedure): Post Debridement Measurements of Total Wound Length: (cm)  0.6 Stage: Category/Stage III Width: (cm) 1 Depth: (cm) 0.3 Volume: (cm) 0.141 Character of Wound/Ulcer Post Debridement: Stable Post Procedure Diagnosis Same as Pre-procedure Electronic Signature(s) Signed: 10/14/2019 3:08:17 PM By: Army Melia Signed: 10/14/2019 4:59:38 PM By: Worthy Keeler PA-C Entered By: Army Melia on 10/14/2019 13:07:40 [name], [name] ([id number]) -------------------------------------------------------------------------------- HPI Details Patient Name: [name] Date of Service: 10/14/2019 12:30 PM Medical Record Number: [id number] Patient Account Number: [id number] Date of Birth/Sex: 09-Oct-1931 (86 y.o. M) Treating RN: Army Melia Primary Care Provider: [name] Other Clinician: Referring Provider: [name] Treating Provider/Extender: Melburn Hake, Tymel Conely Weeks in Treatment: 7 History of Present Illness HPI Description: 05/19/18 on evaluation today patient presents for initial evaluation and clinic concerning issues that he has been having with his right medial lower leg ulcer. The patient has actually been seen in the emergency department for cellulitis he was admitted at [facility] on 04/19/18 discharged 04/21/18. He was noted to have proof of vascular disease and did undergo an angiogram on 04/20/18 by Dr. Leotis Pain. 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 Subsequently Dr. dew following intervention noted that the patient had depending on the location 20-40% residual stenosis noted and significant improvement in the overall vascular flow. Obviously this is great news. With that being said the patient's wound he states does seem to be doing some better compared to previous. Upon inspection indeed it appears that he has some epithelialization. Overall I do not see any evidence of significant infection which is great news. No fevers chills  noted 05/26/18 on evaluation today patient's wound actually appears to be doing much better at this time. With that being said I do think the [facility] Dressing a be sticking according to my nurse and this may be causing some issues as well. I'm gonna suggest at this point that we likely add mepitel underneath the [facility] Dressing help prevent this. Other  than that everything seems to be doing excellent. 06/02/18 on evaluation today patient actually appears to be doing decently well in regard to his right lower extremity ulcer. This seems to show signs of good improvement which is excellent news. With that being said he did see vascular the good news is no further intervention is recommended nor necessary at this point. Actually placed in an [employer] and sent orders to home health which in the end it led to her switching the patient from the three layer compression wrap of the right lower extremity to an [employer] wrap. I feel like he did better in the three layer compression. With that being said the patient currently shows no signs of worsening the mepitel did help prevent any sticking to the wound bed which is great news. 06/12/18 an evaluation today patient presents for follow-up concerning his right lower extremity ulceration. The medial aspect ulcer appears to be doing fairly well this time which is good news. He has a blister on the lateral aspect although I'm concerned this may actually be due to the fact that his wrap is sliding down. We seem to be having issues with getting this wrapped appropriately were it will stay in place. Fortunately there does not appear to be any signs of infection at this time which is good news. He is seen with his son present at this point during the visit. 06/16/18 on evaluation today patient appears to be doing rather well and in fact appears to be almost completely healed in regard to his lower extremity ulcer on the right. The wrap much better this  week with [place] wrapping it and just keeping him in that same wrap until follow-up. For that reason we will discontinue home health services. Fortunately there does not appear to be the evidence of infection at this point. 06/23/18 on evaluation today patient appears to be doing very well in regard to his ulcer which in fact appears to be completely healed. He has been tolerating the wraps without complication although he is definitely ready to be out of these considering how well things have done. Fortunately there is not appear to be any signs of infection at this time which is great news. 08/26/2019 upon evaluation today patient appears to be doing somewhat poorly upon initial inspection here in our clinic for his current issue. This is a patient that I previously taken care of with regard to wounds on his legs. With that being said the issues that were having right now are actually separate from that he has a heel ulcer unfortunately and then subsequently also does have a wound in the right gluteal region that appears to be more of a skin tear. The patient does have a history of diabetes mellitus type 2, peripheral vascular disease, hypertension, and chronic kidney disease stage IV. He is also somewhat weak though he does like to walk around which is actually a good thing he does so slowly. He is currently at an assisted living facility at this point. 09/02/2019 upon evaluation today patient appears to be doing well with regard to the wound on his left heel as well as the right gluteal region. He has had the arterial studies I did look up the numbers and it appears that his TBI's were actually pretty good bilaterally measuring about 1 on the left and around 0.64 on the right. With that being said the final report is not here yet we should have that by next week. Nonetheless looking  at the results of the imaging I feel like he has good arterial flow to be able to proceed with debridement today especially  on this left heel. 09/09/2019 upon evaluation today patient's wounds actually appear to be showing signs of improvement. In the gluteal/sacral region he is showing signs of significant improvement and in fact I really do not see much that is exactly open although there is a lot of moisture type breakdown that I do see at this point. Nonetheless I believe that the patient can potentially transition from a dressing to just using a barrier cream in this area. In regard to the heel that it does appear to be showing signs of improvement I think the Iodoflex is doing a good job. 09/16/2019 upon evaluation today patient appears to be doing well with regard to his heel ulcer. He has been tolerating the dressing changes without complication. Fortunately there is no signs of active infection at this point which is great news. There is some question about physical therapy whether he should be cutting back on this or not. With that being said it does not appear its causing any damage to his heel I think he can actually continue with therapy at this point. 09/23/2019 upon evaluation today patient appears to be doing excellent in regard to his heel ulcer. He has been tolerating the dressing changes without complication. Fortunately there is no signs of active infection at this time. No fevers, chills, nausea, vomiting, or diarrhea. 4/22; right heel ulcer at the tip of his heel. Raised edges nonviable surface. We have been using Iodoflex dimensions of the wound improving oThe patient had a fall this week. He had a skin tear on his right upper lateral humeral area. We are asked to look at this today 10/07/2019 upon evaluation today patient appears to be doing better with regard to his heel I think we may be ready to switch to a collagen-based dressing here. In regard to the skin tear unfortunately the think this is good to have to have some of the skin removed it does not seem to be healing as appropriately as we would  like. Fortunately there is no signs of active infection at this time. HENRICK, MCGUE ([id number]) 10/14/2019 patient appears to be doing well in regard to both his left heel ulcer as well as his right upper arm ulcer. He has been tolerating the dressing changes without complication. Fortunately there is no signs of active infection at this time. Overall I think the collagen has done well although it sticking somewhat to the arm at this point which I think may be slowing things down to some degree. Fortunately there is no signs of worsening at either location as it is anyway. Electronic Signature(s) Signed: 10/14/2019 1:13:41 PM By: Worthy Keeler PA-C Entered By: Worthy Keeler on 10/14/2019 13:13:41 [name], [name] ([id number]) -------------------------------------------------------------------------------- Physical Exam Details Patient Name: [name] Date of Service: 10/14/2019 12:30 PM Medical Record Number: [id number] Patient Account Number: [id number] Date of Birth/Sex: 06-20-1931 (87 y.o. M) Treating RN: Army Melia Primary Care Provider: [name] Other Clinician: Referring Provider: [name] Treating Provider/Extender: Melburn Hake, Thu Baggett Weeks in Treatment: 7 Constitutional Well-nourished and well-hydrated in no acute distress. Respiratory normal breathing without difficulty. Psychiatric this patient is able to make decisions and demonstrates good insight into disease process. Alert and Oriented x 3. pleasant and cooperative. Notes Upon inspection patient's wound bed actually showed signs of good granulation at this time at both locations. Again  the collagen is sticking to the arm we probably should just switch to Xeroform here to be honest. In regard to the heel ulcer we will continue with the collagen for now although I think looking into Dermagraft may be a strong possibility here for him as well as far as try to get this closed as quickly as possible. Electronic  Signature(s) Signed: 10/14/2019 1:14:05 PM By: Worthy Keeler PA-C Entered By: Worthy Keeler on 10/14/2019 13:14:04 [name], [name] ([id number]) -------------------------------------------------------------------------------- Physician Orders Details Patient Name: [name] Date of Service: 10/14/2019 12:30 PM Medical Record Number: [id number] Patient Account Number: [id number] Date of Birth/Sex: [birth year] (87 y.o. M) Treating RN: Army Melia Primary Care Provider: [name] Other Clinician: Referring Provider: [name] Treating Provider/Extender: Melburn Hake, Herny Scurlock Weeks in Treatment: 7 Verbal / Phone Orders: No Diagnosis Coding ICD-10 Coding Code Description E11.621 Type 2 diabetes mellitus with foot ulcer S40.811D Abrasion of right upper arm, subsequent encounter L89.620 Pressure ulcer of left heel, unstageable I73.89 Other specified peripheral vascular diseases I10 Essential (primary) hypertension N18.4 Chronic kidney disease, stage 4 (severe) Wound Cleansing Wound #2 Left Calcaneus o May Shower, gently pat wound dry prior to applying new dressing. Wound #4 Right Upper Arm o May Shower, gently pat wound dry prior to applying new dressing. Anesthetic (add to Medication List) Wound #2 Left Calcaneus o Topical Lidocaine 4% cream applied to wound bed prior to debridement (In Clinic Only). - in clinic only Wound #4 Right Upper Arm o Topical Lidocaine 4% cream applied to wound bed prior to debridement (In Clinic Only). - in clinic only Primary Wound Dressing Wound #2 Left Calcaneus o Silver Collagen - moisten with normal saline Wound #4 Right Upper Arm o Xeroform Secondary Dressing Wound #2 Left Calcaneus o Boardered Foam Dressing Wound #4 Right Upper Arm o Conform/Kerlix Dressing Change Frequency Wound #2 Left Calcaneus o Change Dressing Monday, Wednesday, Friday Wound #4 Right Upper Arm o Change Dressing Monday, Wednesday,  Friday Follow-up Appointments Wound #2 Left Calcaneus o Return Appointment in 1 week. Wound #4 Right Upper Arm o Return Appointment in 1 week. [name], [name] ([id number]) Edema Control Wound #2 Left Calcaneus o Elevate legs to the level of the heart and pump ankles as often as possible Off-Loading Wound #2 Left Calcaneus o Turn and reposition every 2 hours Home Health Wound #2 Left [place] [facility] o Home Health Nurse may visit PRN to address patientos wound care needs. o FACE TO FACE ENCOUNTER: MEDICARE and MEDICAID PATIENTS: I certify that this patient is under my care and that I had a face-to-face encounter that meets the physician face-to-face encounter requirements with this patient on this date. The encounter with the patient was in whole or in part for the following MEDICAL CONDITION: (primary reason for [place]) MEDICAL NECESSITY: I certify, that based on my findings, NURSING services are a medically necessary home health service. HOME BOUND STATUS: I certify that my clinical findings support that this patient is homebound (i.e., Due to illness or injury, pt requires aid of supportive devices such as crutches, cane, wheelchairs, walkers, the use of special transportation or the assistance of another person to leave their place of residence. There is a normal inability to leave the home and doing so requires considerable and taxing effort. Other absences are for medical reasons / religious services and are infrequent or of short duration when for other reasons). o If current dressing causes regression in wound condition,  may D/C ordered dressing product/s and apply Normal Saline Moist Dressing daily until next [place] / Other MD appointment. [place] of regression in wound condition at [phone]. o Please direct any NON-WOUND related issues/requests for orders to patient's Primary Care  Physician Wound #4 Right Upper Arm o [place] Nurse may visit PRN to address patientos wound care needs. o FACE TO FACE ENCOUNTER: MEDICARE and MEDICAID PATIENTS: I certify that this patient is under my care and that I had a face-to-face encounter that meets the physician face-to-face encounter requirements with this patient on this date. The encounter with the patient was in whole or in part for the following MEDICAL CONDITION: (primary reason for [place]) MEDICAL NECESSITY: I certify, that based on my findings, NURSING services are a medically necessary home health service. HOME BOUND STATUS: I certify that my clinical findings support that this patient is homebound (i.e., Due to illness or injury, pt requires aid of supportive devices such as crutches, cane, wheelchairs, walkers, the use of special transportation or the assistance of another person to leave their place of residence. There is a normal inability to leave the home and doing so requires considerable and taxing effort. Other absences are for medical reasons / religious services and are infrequent or of short duration when for other reasons). o If current dressing causes regression in wound condition, may D/C ordered dressing product/s and apply Normal Saline Moist Dressing daily until next [place] / Other MD appointment. [place] of regression in wound condition at [phone]. o Please direct any NON-WOUND related issues/requests for orders to patient's Primary Care Physician Electronic Signature(s) Signed: 10/14/2019 3:08:17 PM By: Army Melia Signed: 10/14/2019 4:59:38 PM By: Worthy Keeler PA-C Entered By: Army Melia on 10/14/2019 13:08:54 [name], [name] ([id number]) -------------------------------------------------------------------------------- Problem List Details Patient Name: [name] Date of Service: 10/14/2019 12:30  PM Medical Record Number: [id number] Patient Account Number: [id number] Date of Birth/Sex: 23-Jan-1932 (86 y.o. M) Treating RN: Army Melia Primary Care Provider: [name] Other Clinician: Referring Provider: [name] Treating Provider/Extender: Melburn Hake, Moriah Loughry Weeks in Treatment: 7 Active Problems ICD-10 Encounter Code Description Active Date MDM Diagnosis E11.621 Type 2 diabetes mellitus with foot ulcer 08/26/2019 No Yes S40.811D Abrasion of right upper arm, subsequent encounter 09/30/2019 No Yes L89.620 Pressure ulcer of left heel, unstageable 08/26/2019 No Yes I73.89 Other specified peripheral vascular diseases 08/26/2019 No Yes I10 Essential (primary) hypertension 08/26/2019 No Yes N18.4 Chronic kidney disease, stage 4 (severe) 08/26/2019 No Yes Inactive Problems ICD-10 Code Description Active Date Inactive Date S31.819A Unspecified open wound of right buttock, initial encounter 08/26/2019 08/26/2019 Resolved Problems Electronic Signature(s) Signed: 10/14/2019 12:58:43 PM By: Worthy Keeler PA-C Entered By: Worthy Keeler on 10/14/2019 12:58:43 Quaintance, [name] ([id number]) -------------------------------------------------------------------------------- Progress Note Details Patient Name: [name] Date of Service: 10/14/2019 12:30 PM Medical Record Number: [id number] Patient Account Number: [id number] Date of Birth/Sex: 02-15-1932 (87 y.o. M) Treating RN: Army Melia Primary Care Provider: [name] Other Clinician: Referring Provider: [name] Treating Provider/Extender: Melburn Hake, Alec Mcphee Weeks in Treatment: 7 Subjective Chief Complaint Information obtained from Patient Left heel ulcer History of Present Illness (HPI) 05/19/18 on evaluation today patient presents for initial evaluation and clinic concerning issues that he has been having with his right medial lower leg ulcer. The patient has actually been seen in the emergency department for cellulitis he  was admitted at [facility]  on 04/19/18 discharged 04/21/18. He was noted to have proof of vascular disease and did undergo an angiogram on 04/20/18 by Dr. Leotis Pain. 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 Subsequently Dr. dew following intervention noted that the patient had depending on the location 20-40% residual stenosis noted and significant improvement in the overall vascular flow. Obviously this is great news. With that being said the patient's wound he states does seem to be doing some better compared to previous. Upon inspection indeed it appears that he has some epithelialization. Overall I do not see any evidence of significant infection which is great news. No fevers chills noted 05/26/18 on evaluation today patient's wound actually appears to be doing much better at this time. With that being said I do think the [facility] Dressing a be sticking according to my nurse and this may be causing some issues as well. I'm gonna suggest at this point that we likely add mepitel underneath the [facility] Dressing help prevent this. Other than that everything seems to be doing excellent. 06/02/18 on evaluation today patient actually appears to be doing decently well in regard to his right lower extremity ulcer. This seems to show signs of good improvement which is excellent news. With that being said he did see vascular the good news is no further intervention is recommended nor necessary at this point. Actually placed in an [employer] and sent orders to home health which in the end it led to her switching the patient from the three layer compression wrap of the right lower extremity to an [employer] wrap. I feel like he did better in the three layer compression. With that being said the patient currently shows no signs of worsening the mepitel did help prevent any sticking to the  wound bed which is great news. 06/12/18 an evaluation today patient presents for follow-up concerning his right lower extremity ulceration. The medial aspect ulcer appears to be doing fairly well this time which is good news. He has a blister on the lateral aspect although I'm concerned this may actually be due to the fact that his wrap is sliding down. We seem to be having issues with getting this wrapped appropriately were it will stay in place. Fortunately there does not appear to be any signs of infection at this time which is good news. He is seen with his son present at this point during the visit. 06/16/18 on evaluation today patient appears to be doing rather well and in fact appears to be almost completely healed in regard to his lower extremity ulcer on the right. The wrap much better this week with [place] wrapping it and just keeping him in that same wrap until follow-up. For that reason we will discontinue home health services. Fortunately there does not appear to be the evidence of infection at this point. 06/23/18 on evaluation today patient appears to be doing very well in regard to his ulcer which in fact appears to be completely healed. He has been tolerating the wraps without complication although he is definitely ready to be out of these considering how well things have done. Fortunately there is not appear to be any signs of infection at this time which is great news. 08/26/2019 upon evaluation today patient appears to be doing somewhat poorly upon initial inspection here in our clinic for his current issue. This is a patient  that I previously taken care of with regard to wounds on his legs. With that being said the issues that were having right now are actually separate from that he has a heel ulcer unfortunately and then subsequently also does have a wound in the right gluteal region that appears to be more of a skin tear. The patient does have a history of diabetes mellitus type 2,  peripheral vascular disease, hypertension, and chronic kidney disease stage IV. He is also somewhat weak though he does like to walk around which is actually a good thing he does so slowly. He is currently at an assisted living facility at this point. 09/02/2019 upon evaluation today patient appears to be doing well with regard to the wound on his left heel as well as the right gluteal region. He has had the arterial studies I did look up the numbers and it appears that his TBI's were actually pretty good bilaterally measuring about 1 on the left and around 0.64 on the right. With that being said the final report is not here yet we should have that by next week. Nonetheless looking at the results of the imaging I feel like he has good arterial flow to be able to proceed with debridement today especially on this left heel. 09/09/2019 upon evaluation today patient's wounds actually appear to be showing signs of improvement. In the gluteal/sacral region he is showing signs of significant improvement and in fact I really do not see much that is exactly open although there is a lot of moisture type breakdown that I do see at this point. Nonetheless I believe that the patient can potentially transition from a dressing to just using a barrier cream in this area. In regard to the heel that it does appear to be showing signs of improvement I think the Iodoflex is doing a good job. 09/16/2019 upon evaluation today patient appears to be doing well with regard to his heel ulcer. He has been tolerating the dressing changes without complication. Fortunately there is no signs of active infection at this point which is great news. There is some question about physical therapy whether he should be cutting back on this or not. With that being said it does not appear its causing any damage to his heel I think he can actually continue with therapy at this point. 09/23/2019 upon evaluation today patient appears to be doing  excellent in regard to his heel ulcer. He has been tolerating the dressing changes without complication. Fortunately there is no signs of active infection at this time. No fevers, chills, nausea, vomiting, or diarrhea. 4/22; right heel ulcer at the tip of his heel. Raised edges nonviable surface. We have been using Iodoflex dimensions of the wound improving The patient had a fall this week. He had a skin tear on his right upper lateral humeral area. We are asked to look at this today [name], [name]. ([id number]) 10/07/2019 upon evaluation today patient appears to be doing better with regard to his heel I think we may be ready to switch to a collagen-based dressing here. In regard to the skin tear unfortunately the think this is good to have to have some of the skin removed it does not seem to be healing as appropriately as we would like. Fortunately there is no signs of active infection at this time. 10/14/2019 patient appears to be doing well in regard to both his left heel ulcer as well as his right upper arm ulcer. He has been  tolerating the dressing changes without complication. Fortunately there is no signs of active infection at this time. Overall I think the collagen has done well although it sticking somewhat to the arm at this point which I think may be slowing things down to some degree. Fortunately there is no signs of worsening at either location as it is anyway. Objective Constitutional Well-nourished and well-hydrated in no acute distress. Vitals Time Taken: 12:40 PM, Height: 68 in, Weight: 145 lbs, BMI: 22, Temperature: 97.6 F, Pulse: 79 bpm, Respiratory Rate: 16 breaths/min, Blood Pressure: 155/63 mmHg. Respiratory normal breathing without difficulty. Psychiatric this patient is able to make decisions and demonstrates good insight into disease process. Alert and Oriented x 3. pleasant and cooperative. General Notes: Upon inspection patient's wound bed actually showed signs of good  granulation at this time at both locations. Again the collagen is sticking to the arm we probably should just switch to Xeroform here to be honest. In regard to the heel ulcer we will continue with the collagen for now although I think looking into Dermagraft may be a strong possibility here for him as well as far as try to get this closed as quickly as possible. Integumentary (Hair, Skin) Wound #2 status is Open. Original cause of wound was Gradually Appeared. The wound is located on the Left Calcaneus. The wound measures 0.6cm length x 1cm width x 0.3cm depth; 0.471cm^2 area and 0.141cm^3 volume. There is Fat Layer (Subcutaneous Tissue) Exposed exposed. There is no tunneling or undermining noted. There is a medium amount of serous drainage noted. The wound margin is flat and intact. There is large (67-100%) pink granulation within the wound bed. There is a small (1-33%) amount of necrotic tissue within the wound bed including Adherent Slough. Wound #4 status is Open. Original cause of wound was Trauma. The wound is located on the Right Upper Arm. The wound measures 1.2cm length x 2cm width x 0.1cm depth; 1.885cm^2 area and 0.188cm^3 volume. There is Fat Layer (Subcutaneous Tissue) Exposed exposed. There is no tunneling or undermining noted. There is a medium amount of serous drainage noted. The wound margin is flat and intact. There is medium (34-66%) pink granulation within the wound bed. There is a medium (34-66%) amount of necrotic tissue within the wound bed including Eschar. Assessment Active Problems ICD-10 Type 2 diabetes mellitus with foot ulcer Abrasion of right upper arm, subsequent encounter Pressure ulcer of left heel, unstageable Other specified peripheral vascular diseases Essential (primary) hypertension Chronic kidney disease, stage 4 (severe) Procedures Wound #2 Pre-procedure diagnosis of Wound #2 is a Pressure Ulcer located on the Left Calcaneus . There was a Excisional  Skin/Subcutaneous Tissue Debridement with a total area of 0.6 sq cm performed by STONE III, Daiquan Resnik E., PA-C. With the following instrument(s): Curette to remove Viable Mccadden, Zaidan R. ([id number]) and Non-Viable tissue/material. Material removed includes Callus, Subcutaneous Tissue, and Slough after achieving pain control using Lidocaine. A time out was conducted at 13:05, prior to the start of the procedure. A Minimum amount of bleeding was controlled with Pressure. The procedure was tolerated well. Post Debridement Measurements: 0.6cm length x 1cm width x 0.3cm depth; 0.141cm^3 volume. Post debridement Stage noted as Category/Stage III. Character of Wound/Ulcer Post Debridement is stable. Post procedure Diagnosis Wound #2: Same as Pre-Procedure Plan Wound Cleansing: Wound #2 Left Calcaneus: May Shower, gently pat wound dry prior to applying new dressing. Wound #4 Right Upper Arm: May Shower, gently pat wound dry prior to applying new dressing. Anesthetic (add  to Medication List): Wound #2 Left Calcaneus: Topical Lidocaine 4% cream applied to wound bed prior to debridement (In Clinic Only). - in clinic only Wound #4 Right Upper Arm: Topical Lidocaine 4% cream applied to wound bed prior to debridement (In Clinic Only). - in clinic only Primary Wound Dressing: Wound #2 Left Calcaneus: Silver Collagen - moisten with normal saline Wound #4 Right Upper Arm: Xeroform Secondary Dressing: Wound #2 Left Calcaneus: Boardered Foam Dressing Wound #4 Right Upper Arm: Conform/Kerlix Dressing Change Frequency: Wound #2 Left Calcaneus: Change Dressing Monday, Wednesday, Friday Wound #4 Right Upper Arm: Change Dressing Monday, Wednesday, Friday Follow-up Appointments: Wound #2 Left Calcaneus: Return Appointment in 1 week. Wound #4 Right Upper Arm: Return Appointment in 1 week. Edema Control: Wound #2 Left Calcaneus: Elevate legs to the level of the heart and pump ankles as often as  possible Off-Loading: Wound #2 Left Calcaneus: Turn and reposition every 2 hours Home Health: Wound #2 Left Calcaneus: Continue Home Health Visits - [facility] Nurse may visit PRN to address patient s wound care needs. FACE TO FACE ENCOUNTER: MEDICARE and MEDICAID PATIENTS: I certify that this patient is under my care and that I had a face-to-face encounter that meets the physician face-to-face encounter requirements with this patient on this date. The encounter with the patient was in whole or in part for the following MEDICAL CONDITION: (primary reason for [place]) MEDICAL NECESSITY: I certify, that based on my findings, NURSING services are a medically necessary home health service. HOME BOUND STATUS: I certify that my clinical findings support that this patient is homebound (i.e., Due to illness or injury, pt requires aid of supportive devices such as crutches, cane, wheelchairs, walkers, the use of special transportation or the assistance of another person to leave their place of residence. There is a normal inability to leave the home and doing so requires considerable and taxing effort. Other absences are for medical reasons / religious services and are infrequent or of short duration when for other reasons). If current dressing causes regression in wound condition, may D/C ordered dressing product/s and apply Normal Saline Moist Dressing daily until next [place] / Other MD appointment. [place] of regression in wound condition at [phone]. Please direct any NON-WOUND related issues/requests for orders to patient's Primary Care Physician Wound #4 Right Upper Arm: [place] Nurse may visit PRN to address patient s wound care needs. FACE TO FACE ENCOUNTER: MEDICARE and MEDICAID PATIENTS: I certify that this patient is under my care and that I had a face-to-face encounter that meets the  physician face-to-face encounter requirements with this patient on this date. The encounter with the patient was in whole or in part for the following MEDICAL CONDITION: (primary reason for [place]) MEDICAL NECESSITY: I certify, that based on my findings, NURSING services are a medically necessary home health service. HOME BOUND STATUS: I certify that my clinical findings support that this patient is homebound (i.e., Due to illness or injury, pt requires aid of supportive devices such as crutches, cane, wheelchairs, walkers, the use of special transportation or the assistance of another person to leave their place of residence. There is a normal inability to leave the home and doing so requires considerable and taxing effort. Other absences are for medical reasons / religious services and are infrequent or of short duration when for other reasons). If current dressing causes regression in wound condition, may D/C ordered dressing  product/s and apply Normal Saline Moist Dressing daily until [name], [name] ([id number]) next [place] / Other MD appointment. [place] of regression in wound condition at [phone]. Please direct any NON-WOUND related issues/requests for orders to patient's Primary Care Physician 1. My suggestion currently is going to be that we go ahead and initiate treatment with a Xeroform gauze dressing to his arm ulcer at this point. I felt the collagen is sticking because the wound is much more dry than it was in the beginning. Does not necessarily a bad thing but we do not want him to get too dry. 2. I am recommend that we continue with the collagen for the heel at this point although I would like to check into getting approval for Dermagraft as well. 3. I am also going to suggest the patient continue with appropriate offloading with regard to the heel he seems to be doing very well in this regard. We will see patient back for reevaluation  in 1 week here in the clinic. If anything worsens or changes patient will contact our office for additional recommendations. Electronic Signature(s) Signed: 10/14/2019 1:15:23 PM By: Worthy Keeler PA-C Entered By: Worthy Keeler on 10/14/2019 13:15:22 [name], [name] ([id number]) -------------------------------------------------------------------------------- SuperBill Details Patient Name: [name] Date of Service: 10/14/2019 Medical Record Number: [id number] Patient Account Number: [id number] Date of Birth/Sex: [birth year] (87 y.o. M) Treating RN: Army Melia Primary Care Provider: [name] Other Clinician: Referring Provider: [name] Treating Provider/Extender: Melburn Hake, Devine Klingel Weeks in Treatment: 7 Diagnosis Coding ICD-10 Codes Code Description E11.621 Type 2 diabetes mellitus with foot ulcer S40.811D Abrasion of right upper arm, subsequent encounter L89.620 Pressure ulcer of left heel, unstageable I73.89 Other specified peripheral vascular diseases I10 Essential (primary) hypertension N18.4 Chronic kidney disease, stage 4 (severe) Facility Procedures CPT4 Code: 19379024 Description: 09735 - DEB SUBQ TISSUE 20 SQ CM/< Modifier: Quantity: 1 CPT4 Code: Description: ICD-10 Diagnosis Description L89.620 Pressure ulcer of left heel, unstageable Modifier: Quantity: Physician Procedures CPT4 Code: 3299242 Description: 68341 - WC PHYS SUBQ TISS 20 SQ CM Modifier: Quantity: 1 CPT4 Code: Description: ICD-10 Diagnosis Description L89.620 Pressure ulcer of left heel, unstageable Modifier: Quantity: Electronic Signature(s) Signed: 10/14/2019 1:15:37 PM By: Worthy Keeler PA-C Entered By: Worthy Keeler on 10/14/2019 13:15:36

## 2019-10-15 ENCOUNTER — Non-Acute Institutional Stay: Payer: Medicare Other | Admitting: Nurse Practitioner

## 2019-10-15 ENCOUNTER — Encounter: Payer: Self-pay | Admitting: Nurse Practitioner

## 2019-10-15 DIAGNOSIS — Z515 Encounter for palliative care: Secondary | ICD-10-CM

## 2019-10-15 DIAGNOSIS — F0391 Unspecified dementia with behavioral disturbance: Secondary | ICD-10-CM

## 2019-10-15 NOTE — Progress Notes (Signed)
[facility] Consult Note Telephone: [phone]  Fax: [phone]  PATIENT NAME: [name] DOB: September 18, 1931 MRN: [id number]  PRIMARY CARE PROVIDER:   [name], MD  REFERRING PROVIDER:  [name], MD [address] [facility] [place],  [place] [address]  RESPONSIBLE PARTY:Daughter in [place] This visitand family meetingwas done face to face in person  RECOMMENDATIONS and PLAN: 1.ACP:DNR, in Epic/Vynca; will continue PTwith ongoing improvements, wishes are no dialysis but to treat what is treatable,  2.Palliative care encounter; Palliative medicine team will continue to support patient, patient's family, and medical team. Visit consisted of counseling and education dealing with the complex and emotionally intense issues of symptom management and palliative care in the setting of serious and potentially life-threatening illness  I spent 60 minutes providing this consultation,  from 12:00pm to 1:00pm. More than 50% of the time in this consultation was spent coordinating communication.   HISTORY OF PRESENT ILLNESS:  [name] is a 86 y.o. year old male with multiple medical problems including Dementia, Cancer, iron deficiency anemia , peripheral artery disease, chronic kidney disease, diabetes, heart murmur, hypertension, hyperlipidemia, retinal detachment, polypectomy, right intramedullary nail, hernia repair, eye surgery.Marland KitchenIn-person palliative care follow-up visit with [name]. Staff endorses [name] has been improving. [name] has continued to walk with his walker though had a fall two weeks ago where he had a wound to his left arm. [name] does continue to go to the wound care center with left arm wound added. [name] does attempted to let himself. [name] is able to get up out of the bed on his own. [name] does attempt to get himself dressed, assistance with ADLs. Mr Robb does feed himself and  appetite has continue to improve. Mr Yeagle has been going to the dining area for meals. Staff endorses [name] overall has seemed better. I present [name] is sitting in the chair in the dining area. Mr Brau appears comfortable. No visitors present. I visited and observed [name]. We talked about purpose of palliative care visit. [name] in agreement. We talked about how he is feeling. [name] endorses that he is doing very well. We talked about symptoms of pain and shortness of breath which he denies. We talked about his appetite. [name] endorses that he is eating his food. We talked about the fall that he had. We talked about living at [facility]. [name] did talk about the donkey outside. [name] endorses that his daughter-in-law to [place] and he planted tomato plants. We talked about quality of life. We talked about residing at [facility]. Mr Cuadros was cooperative with the assessment. Emotional support provided. Noted cognitive impairment as he does have difficulty processing information and delay between responses. I met with [name], Mr Chausse daughter-in-law, Health Care power-of-attorney. Clinical update discuss. We talked about visit with [name]. We talked about symptoms, appetite. We talked about Mr Hymes fall and visits to the wound care center. We talked about functional ability. We talked about [name] walking with his walker. We talked about Mr Nehme cognition. [name] endorses that she is concerned that he is declined cognitively. We talked about the difficulty in processing. [name] talked at length about his disinterest in activities. [name] talked about when they were planning the tomatoes he did not seem as interested but wish to sit and watch the donkey for hours. We talked about quality-of-life. We  talked about medical goals of care. We talked about quality life and what that means to [name]. [name] talked about his love for ham biscuits. We talked about his interest in the donkey.  [name]. We talked about possibly scheduling time for him to go watch the donkey with staff or family. We talked about what previously may have been quality activities [name] enjoyed may not be the same as he would enjoy now. We talked about his cognition that he may not be able to understand like he previously did. We talked about multiple catastrophic events including covid. We talked about loss of [place]. We talked about concerned for stability. We talked about realistic expectations with progression of disease. We talked about role of palliative care and plan of care. We talked about family dynamics. We talked about follow-up visit in 4 weeks if needed or sooner should he decline. [name] in agreement, appointments scheduled. I updated nursing staff. Any changes to current goals or plan of care. Palliative Care was asked to help to continue to address goals of care.   CODE STATUS: DNR  PPS: 40% HOSPICE ELIGIBILITY/DIAGNOSIS: TBD  PAST MEDICAL HISTORY:  Past Medical History:  Diagnosis Date  . Cancer (HCC)   . Chronic kidney disease   . Colon polyp   . Diabetes mellitus without complication (HCC)   . Heart murmur 2008  . Hyperlipidemia   . Hypertension 1980  . Retinal detachment     SOCIAL HX:  Social History   Tobacco Use  . Smoking status: Never Smoker  . Smokeless tobacco: Never Used  Substance Use Topics  . Alcohol use: No    ALLERGIES:  Allergies  Allergen Reactions  . Meperidine Other (See Comments)    Severe bradycardia Severe bradycardia   . Other     Darvocet-N Lowers heart rate  . Penicillins Rash     PERTINENT MEDICATIONS:  Outpatient Encounter Medications as of 10/15/2019  Medication Sig  . acetaminophen (TYLENOL) 500 MG tablet Take 500 mg by mouth every 12 (twelve) hours as needed for mild pain.  . amLODipine (NORVASC) 10 MG tablet Take 10 mg by mouth daily.   . aspirin EC 81 MG EC tablet Take 1 tablet (81 mg  total) by mouth daily.  . B-Complex TABS Take 1 tablet by mouth daily.  . calcitRIOL (ROCALTROL) 0.25 MCG capsule Take 0.25 mcg by mouth daily.   . calcium carbonate (OSCAL) 1500 (600 Ca) MG TABS tablet Take 600 mg of elemental calcium by mouth 2 (two) times daily.  . Cholecalciferol (VITAMIN D) 50 MCG (2000 UT) tablet Take 2,000 Units by mouth daily.  . denosumab (PROLIA) 60 MG/ML SOSY injection Inject 60 mg into the skin every 6 (six) months.  . ferrous sulfate 325 (65 FE) MG tablet Take 325 mg by mouth daily with breakfast.  . furosemide (LASIX) 20 MG tablet Take 10-40 mg by mouth 2 (two) times daily. Take 2 tablets (40mg) by mouth every morning and take  tablet (10mg) by mouth at lunchtime  . JALYN 0.5-0.4 MG CAPS Take 1 capsule by mouth daily.   . linagliptin (TRADJENTA) 5 MG TABS tablet Take 5 mg by mouth daily.  . losartan (COZAAR) 25 MG tablet Take 0.5 tablets (12.5 mg total) by mouth 2 (two) times daily.  . Multiple Vitamin (MULTIVITAMIN) capsule Take 1 capsule by mouth daily.  . Nutritional Supplements (FEEDING SUPPLEMENT, NEPRO CARB STEADY,) LIQD Take 237 mLs   by mouth 3 (three) times daily with meals.  . polyethylene glycol (MIRALAX / GLYCOLAX) 17 g packet Take 17 g by mouth daily.  . potassium chloride (KLOR-CON) 10 MEQ tablet Take 10 mEq by mouth daily.  Marland Kitchen saccharomyces boulardii (FLORASTOR) 250 MG capsule Take 250 mg by mouth 2 (two) times daily.  . sodium bicarbonate 650 MG tablet Take 650 mg by mouth 2 (two) times daily.  Marland Kitchen triamcinolone ointment (KENALOG) 0.1 % Apply 1 application topically 2 (two) times daily. (apply to arms and legs)   No facility-administered encounter medications on file as of 10/15/2019.    PHYSICAL EXAM:   General: NAD, frail appearing, thin elderly male Cardiovascular: regular rate and rhythm Pulmonary: clear ant fields Neurological: Walks with walker  Tallon Gertz Ihor Gully, NP

## 2019-10-18 ENCOUNTER — Other Ambulatory Visit: Payer: Self-pay

## 2019-10-21 ENCOUNTER — Encounter: Payer: Medicare Other | Admitting: Internal Medicine

## 2019-10-21 ENCOUNTER — Other Ambulatory Visit: Payer: Self-pay

## 2019-10-21 DIAGNOSIS — E11621 Type 2 diabetes mellitus with foot ulcer: Secondary | ICD-10-CM | POA: Diagnosis not present

## 2019-10-22 NOTE — Progress Notes (Signed)
[name] ([id number]) Visit Report for 10/21/2019 HPI Details Patient Name: [name], [name] Date of Service: 10/21/2019 12:30 PM Medical Record Number: [id number] Patient Account Number: [id number] Date of Birth/Sex: 26-Jan-1932 (86 y.o. M) Treating RN: Army Melia Primary Care Provider: [name] Other Clinician: Referring Provider: [name] Treating Provider/Extender: Tito Dine in Treatment: 8 History of Present Illness HPI Description: 05/19/18 on evaluation today patient presents for initial evaluation and clinic concerning issues that he has been having with his right medial lower leg ulcer. The patient has actually been seen in the emergency department for cellulitis he was admitted at [facility] on 04/19/18 discharged 04/21/18. He was noted to have proof of vascular disease and did undergo an angiogram on 04/20/18 by Dr. Leotis Pain. 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 Subsequently Dr. dew following intervention noted that the patient had depending on the location 20-40% residual stenosis noted and significant improvement in the overall vascular flow. Obviously this is great news. With that being said the patient's wound he states does seem to be doing some better compared to previous. Upon inspection indeed it appears that he has some epithelialization. Overall I do not see any evidence of significant infection which is great news. No fevers chills noted 05/26/18 on evaluation today patient's wound actually appears to be doing much better at this time. With that being said I do think the [facility] Dressing a be sticking according to my nurse and this may be causing some issues as well. I'm gonna suggest at this point that we likely add mepitel underneath the [facility] Dressing help prevent this. Other than that everything seems to be doing  excellent. 06/02/18 on evaluation today patient actually appears to be doing decently well in regard to his right lower extremity ulcer. This seems to show signs of good improvement which is excellent news. With that being said he did see vascular the good news is no further intervention is recommended nor necessary at this point. Actually placed in an [employer] and sent orders to home health which in the end it led to her switching the patient from the three layer compression wrap of the right lower extremity to an [employer] wrap. I feel like he did better in the three layer compression. With that being said the patient currently shows no signs of worsening the mepitel did help prevent any sticking to the wound bed which is great news. 06/12/18 an evaluation today patient presents for follow-up concerning his right lower extremity ulceration. The medial aspect ulcer appears to be doing fairly well this time which is good news. He has a blister on the lateral aspect although I'm concerned this may actually be due to the fact that his wrap is sliding down. We seem to be having issues with getting this wrapped appropriately were it will stay in place. Fortunately there does not appear to be any signs of infection at this time which is good news. He is seen with his son present at this point during the visit. 06/16/18 on evaluation today patient appears to be doing rather well and in fact appears to be almost completely healed in regard to his lower extremity ulcer on the right. The wrap much better this week with [place] wrapping it and just keeping him in that same wrap until follow-up. For that reason we will discontinue home  health services. Fortunately there does not appear to be the evidence of infection at this point. 06/23/18 on evaluation today patient appears to be doing very well in regard to his ulcer which in fact appears to be completely healed. He has been tolerating the wraps without complication  although he is definitely ready to be out of these considering how well things have done. Fortunately there is not appear to be any signs of infection at this time which is great news. 08/26/2019 upon evaluation today patient appears to be doing somewhat poorly upon initial inspection here in our clinic for his current issue. This is a patient that I previously taken care of with regard to wounds on his legs. With that being said the issues that were having right now are actually separate from that he has a heel ulcer unfortunately and then subsequently also does have a wound in the right gluteal region that appears to be more of a skin tear. The patient does have a history of diabetes mellitus type 2, peripheral vascular disease, hypertension, and chronic kidney disease stage IV. He is also somewhat weak though he does like to walk around which is actually a good thing he does so slowly. He is currently at an assisted living facility at this point. 09/02/2019 upon evaluation today patient appears to be doing well with regard to the wound on his left heel as well as the right gluteal region. He has had the arterial studies I did look up the numbers and it appears that his TBI's were actually pretty good bilaterally measuring about 1 on the left and around 0.64 on the right. With that being said the final report is not here yet we should have that by next week. Nonetheless looking at the results of the imaging I feel like he has good arterial flow to be able to proceed with debridement today especially on this left heel. 09/09/2019 upon evaluation today patient's wounds actually appear to be showing signs of improvement. In the gluteal/sacral region he is showing signs of significant improvement and in fact I really do not see much that is exactly open although there is a lot of moisture type breakdown that I do see at this point. Nonetheless I believe that the patient can potentially transition from a  dressing to just using a barrier cream in this area. In regard to the heel that it does appear to be showing signs of improvement I think the Iodoflex is doing a good job. 09/16/2019 upon evaluation today patient appears to be doing well with regard to his heel ulcer. He has been tolerating the dressing changes without complication. Fortunately there is no signs of active infection at this point which is great news. There is some question about physical therapy whether he should be cutting back on this or not. With that being said it does not appear its causing any damage to his heel I think he can actually continue with therapy at this point. 09/23/2019 upon evaluation today patient appears to be doing excellent in regard to his heel ulcer. He has been tolerating the dressing changes without complication. Fortunately there is no signs of active infection at this time. No fevers, chills, nausea, vomiting, or diarrhea. 4/22; right heel ulcer at the tip of his heel. Raised edges nonviable surface. We have been using Iodoflex dimensions of the wound improving oThe patient had a fall this week. He had a skin tear on his right upper lateral humeral area. We are asked  to look at this today SURESH, AUDI ([id number]) 10/07/2019 upon evaluation today patient appears to be doing better with regard to his heel I think we may be ready to switch to a collagen-based dressing here. In regard to the skin tear unfortunately the think this is good to have to have some of the skin removed it does not seem to be healing as appropriately as we would like. Fortunately there is no signs of active infection at this time. 10/14/2019 patient appears to be doing well in regard to both his left heel ulcer as well as his right upper arm ulcer. He has been tolerating the dressing changes without complication. Fortunately there is no signs of active infection at this time. Overall I think the collagen has done well although it sticking  somewhat to the arm at this point which I think may be slowing things down to some degree. Fortunately there is no signs of worsening at either location as it is anyway. 5/13; the patient had a wound on the right upper humerus this is fully closed. He has an area on the tip of his left heel. Still open may be slightly smaller but not a lot. Still some depth. We are using silver collagen to this area Electronic Signature(s) Signed: 10/21/2019 5:28:47 PM By: Linton Ham MD Entered By: Linton Ham on 10/21/2019 13:06:37 [place]lot[place], [name] ([id number]) -------------------------------------------------------------------------------- Physical Exam Details Patient Name: [name] Date of Service: 10/21/2019 12:30 PM Medical Record Number: [id number] Patient Account Number: [id number] Date of Birth/Sex: 1931/09/20 (87 y.o. M) Treating RN: Army Melia Primary Care Provider: [name] Other Clinician: Referring Provider: [name] Treating Provider/Extender: Tito Dine in Treatment: 8 Cardiovascular Needle pulses are palpable on the left foot. Severe hemosiderin deposition in bilateral lower legs however not much in the way of edema. Skin is badly damaged in these areas. Notes Wound exam; the only remaining wound is on the tip of the left heel. This has a decent looking base however there is still some relative depth here. I did not think any debridement was required this week there is no surrounding erythema. Electronic Signature(s) Signed: 10/21/2019 5:28:47 PM By: Linton Ham MD Entered By: Linton Ham on 10/21/2019 13:09:29 KIAM, BRANSFIELD ([id number]) -------------------------------------------------------------------------------- Physician Orders Details Patient Name: [name] Date of Service: 10/21/2019 12:30 PM Medical Record Number: [id number] Patient Account Number: [id number] Date of Birth/Sex: 1932/04/20 (86 y.o. M) Treating RN: Army Melia Primary Care Provider: [name] Other Clinician: Referring Provider: [name] Treating Provider/Extender: Tito Dine in Treatment: 8 Verbal / Phone Orders: No Diagnosis Coding Wound Cleansing Wound #2 Left Calcaneus o May Shower, gently pat wound dry prior to applying new dressing. Anesthetic (add to Medication List) Wound #2 Left Calcaneus o Topical Lidocaine 4% cream applied to wound bed prior to debridement (In Clinic Only). - in clinic only Primary Wound Dressing Wound #2 Left Calcaneus o Silver Collagen - moisten with normal saline Secondary Dressing Wound #2 Left Calcaneus o Boardered Foam Dressing Dressing Change Frequency Wound #2 Left Calcaneus o Change Dressing Monday, Wednesday, Friday Follow-up Appointments Wound #2 Left Calcaneus o Return Appointment in 1 week. Edema Control Wound #2 Left Calcaneus o Elevate legs to the level of the heart and pump ankles as often as possible Off-Loading Wound #2 Left Calcaneus o Turn and reposition every 2 hours Home Health Wound #2 Left [facility] o Home Health Nurse may visit PRN to  address patientos wound care needs. o FACE TO FACE ENCOUNTER: MEDICARE and MEDICAID PATIENTS: I certify that this patient is under my care and that I had a face-to-face encounter that meets the physician face-to-face encounter requirements with this patient on this date. The encounter with the patient was in whole or in part for the following MEDICAL CONDITION: (primary reason for [place]) MEDICAL NECESSITY: I certify, that based on my findings, NURSING services are a medically necessary home health service. HOME BOUND STATUS: I certify that my clinical findings support that this patient is homebound (i.e., Due to illness or injury, pt requires aid of supportive devices such as crutches, cane, wheelchairs, walkers, the use of special transportation  or the assistance of another person to leave their place of residence. There is a normal inability to leave the home and doing so requires considerable and taxing effort. Other absences are for medical reasons / religious services and are infrequent or of short duration when for other reasons). o If current dressing causes regression in wound condition, may D/C ordered dressing product/s and apply Normal Saline Moist Dressing daily until next [place] / Other MD appointment. Hope of regression in wound condition at [phone]. o Please direct any NON-WOUND related issues/requests for orders to patient's Primary Care Physician ZACHARI, ALBERTA ([id number]) Electronic Signature(s) Signed: 10/21/2019 4:45:22 PM By: Army Melia Signed: 10/21/2019 5:28:47 PM By: Linton Ham MD Entered By: Army Melia on 10/21/2019 13:02:57 EMANNUEL, VISE ([id number]) -------------------------------------------------------------------------------- Problem List Details Patient Name: [name] Date of Service: 10/21/2019 12:30 PM Medical Record Number: [id number] Patient Account Number: [id number] Date of Birth/Sex: 1932/04/16 (86 y.o. M) Treating RN: Army Melia Primary Care Provider: [name] Other Clinician: Referring Provider: [name] Treating Provider/Extender: Tito Dine in Treatment: 8 Active Problems ICD-10 Encounter Code Description Active Date MDM Diagnosis E11.621 Type 2 diabetes mellitus with foot ulcer 08/26/2019 No Yes S40.811D Abrasion of right upper arm, subsequent encounter 09/30/2019 No Yes L89.620 Pressure ulcer of left heel, unstageable 08/26/2019 No Yes I73.89 Other specified peripheral vascular diseases 08/26/2019 No Yes I10 Essential (primary) hypertension 08/26/2019 No Yes N18.4 Chronic kidney disease, stage 4 (severe) 08/26/2019 No Yes Inactive Problems ICD-10 Code Description Active Date Inactive Date S31.819A  Unspecified open wound of right buttock, initial encounter 08/26/2019 08/26/2019 Resolved Problems Electronic Signature(s) Signed: 10/21/2019 5:28:47 PM By: Linton Ham MD Entered By: Linton Ham on 10/21/2019 13:04:55 Rodgers, [name] ([id number]) -------------------------------------------------------------------------------- Progress Note Details Patient Name: [name] Date of Service: 10/21/2019 12:30 PM Medical Record Number: [id number] Patient Account Number: [id number] Date of Birth/Sex: Nov 03, 1931 (86 y.o. M) Treating RN: Army Melia Primary Care Provider: [name] Other Clinician: Referring Provider: [name] Treating Provider/Extender: Tito Dine in Treatment: 8 Subjective History of Present Illness (HPI) 05/19/18 on evaluation today patient presents for initial evaluation and clinic concerning issues that he has been having with his right medial lower leg ulcer. The patient has actually been seen in the emergency department for cellulitis he was admitted at [facility] on 04/19/18 discharged 04/21/18. He was noted to have proof of vascular disease and did undergo an angiogram on 04/20/18 by Dr. Leotis Pain. 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 Subsequently Dr. dew following intervention noted that the patient had depending on the location 20-40% residual stenosis noted and significant improvement in the overall  vascular flow. Obviously this is great news. With that being said the patient's wound he states does seem to be doing some better compared to previous. Upon inspection indeed it appears that he has some epithelialization. Overall I do not see any evidence of significant infection which is great news. No fevers chills noted 05/26/18 on evaluation today patient's wound actually appears to be doing much better at this time. With that  being said I do think the [facility] Dressing a be sticking according to my nurse and this may be causing some issues as well. I'm gonna suggest at this point that we likely add mepitel underneath the [facility] Dressing help prevent this. Other than that everything seems to be doing excellent. 06/02/18 on evaluation today patient actually appears to be doing decently well in regard to his right lower extremity ulcer. This seems to show signs of good improvement which is excellent news. With that being said he did see vascular the good news is no further intervention is recommended nor necessary at this point. Actually placed in an [employer] and sent orders to home health which in the end it led to her switching the patient from the three layer compression wrap of the right lower extremity to an [employer] wrap. I feel like he did better in the three layer compression. With that being said the patient currently shows no signs of worsening the mepitel did help prevent any sticking to the wound bed which is great news. 06/12/18 an evaluation today patient presents for follow-up concerning his right lower extremity ulceration. The medial aspect ulcer appears to be doing fairly well this time which is good news. He has a blister on the lateral aspect although I'm concerned this may actually be due to the fact that his wrap is sliding down. We seem to be having issues with getting this wrapped appropriately were it will stay in place. Fortunately there does not appear to be any signs of infection at this time which is good news. He is seen with his son present at this point during the visit. 06/16/18 on evaluation today patient appears to be doing rather well and in fact appears to be almost completely healed in regard to his lower extremity ulcer on the right. The wrap much better this week with [place] wrapping it and just keeping him in that same wrap until follow-up. For that reason we will discontinue  home health services. Fortunately there does not appear to be the evidence of infection at this point. 06/23/18 on evaluation today patient appears to be doing very well in regard to his ulcer which in fact appears to be completely healed. He has been tolerating the wraps without complication although he is definitely ready to be out of these considering how well things have done. Fortunately there is not appear to be any signs of infection at this time which is great news. 08/26/2019 upon evaluation today patient appears to be doing somewhat poorly upon initial inspection here in our clinic for his current issue. This is a patient that I previously taken care of with regard to wounds on his legs. With that being said the issues that were having right now are actually separate from that he has a heel ulcer unfortunately and then subsequently also does have a wound in the right gluteal region that appears to be more of a skin tear. The patient does have a history of diabetes mellitus type 2, peripheral vascular disease, hypertension, and chronic  kidney disease stage IV. He is also somewhat weak though he does like to walk around which is actually a good thing he does so slowly. He is currently at an assisted living facility at this point. 09/02/2019 upon evaluation today patient appears to be doing well with regard to the wound on his left heel as well as the right gluteal region. He has had the arterial studies I did look up the numbers and it appears that his TBI's were actually pretty good bilaterally measuring about 1 on the left and around 0.64 on the right. With that being said the final report is not here yet we should have that by next week. Nonetheless looking at the results of the imaging I feel like he has good arterial flow to be able to proceed with debridement today especially on this left heel. 09/09/2019 upon evaluation today patient's wounds actually appear to be showing signs of  improvement. In the gluteal/sacral region he is showing signs of significant improvement and in fact I really do not see much that is exactly open although there is a lot of moisture type breakdown that I do see at this point. Nonetheless I believe that the patient can potentially transition from a dressing to just using a barrier cream in this area. In regard to the heel that it does appear to be showing signs of improvement I think the Iodoflex is doing a good job. 09/16/2019 upon evaluation today patient appears to be doing well with regard to his heel ulcer. He has been tolerating the dressing changes without complication. Fortunately there is no signs of active infection at this point which is great news. There is some question about physical therapy whether he should be cutting back on this or not. With that being said it does not appear its causing any damage to his heel I think he can actually continue with therapy at this point. 09/23/2019 upon evaluation today patient appears to be doing excellent in regard to his heel ulcer. He has been tolerating the dressing changes without complication. Fortunately there is no signs of active infection at this time. No fevers, chills, nausea, vomiting, or diarrhea. 4/22; right heel ulcer at the tip of his heel. Raised edges nonviable surface. We have been using Iodoflex dimensions of the wound improving The patient had a fall this week. He had a skin tear on his right upper lateral humeral area. We are asked to look at this today 10/07/2019 upon evaluation today patient appears to be doing better with regard to his heel I think we may be ready to switch to a collagen-based dressing here. In regard to the skin tear unfortunately the think this is good to have to have some of the skin removed it does not seem to be healing as appropriately as we would like. Fortunately there is no signs of active infection at this time. RYLEN, SWINDLER ([id number]) 10/14/2019  patient appears to be doing well in regard to both his left heel ulcer as well as his right upper arm ulcer. He has been tolerating the dressing changes without complication. Fortunately there is no signs of active infection at this time. Overall I think the collagen has done well although it sticking somewhat to the arm at this point which I think may be slowing things down to some degree. Fortunately there is no signs of worsening at either location as it is anyway. 5/13; the patient had a wound on the right upper humerus this is fully  closed. He has an area on the tip of his left heel. Still open may be slightly smaller but not a lot. Still some depth. We are using silver collagen to this area Objective Constitutional Vitals Time Taken: 12:40 PM, Height: 68 in, Weight: 145 lbs, BMI: 22, Temperature: 97.6 F, Pulse: 79 bpm, Respiratory Rate: 18 breaths/min, Blood Pressure: 141/59 mmHg. Cardiovascular Needle pulses are palpable on the left foot. Severe hemosiderin deposition in bilateral lower legs however not much in the way of edema. Skin is badly damaged in these areas. General Notes: Wound exam; the only remaining wound is on the tip of the left heel. This has a decent looking base however there is still some relative depth here. I did not think any debridement was required this week there is no surrounding erythema. Integumentary (Hair, Skin) Wound #2 status is Open. Original cause of wound was Gradually Appeared. The wound is located on the Left Calcaneus. The wound measures 0.6cm length x 0.9cm width x 0.3cm depth; 0.424cm^2 area and 0.127cm^3 volume. There is Fat Layer (Subcutaneous Tissue) Exposed exposed. There is no tunneling or undermining noted. There is a medium amount of serous drainage noted. The wound margin is flat and intact. There is large (67-100%) pink granulation within the wound bed. There is a small (1-33%) amount of necrotic tissue within the wound bed including  Adherent Slough. Wound #4 status is Healed - Epithelialized. Original cause of wound was Trauma. The wound is located on the Right Upper Arm. The wound measures 0cm length x 0cm width x 0cm depth; 0cm^2 area and 0cm^3 volume. There is Fat Layer (Subcutaneous Tissue) Exposed exposed. There is no tunneling or undermining noted. There is a none present amount of drainage noted. The wound margin is flat and intact. There is no granulation within the wound bed. There is a large (67-100%) amount of necrotic tissue within the wound bed including Eschar. Assessment Active Problems ICD-10 Type 2 diabetes mellitus with foot ulcer Abrasion of right upper arm, subsequent encounter Pressure ulcer of left heel, unstageable Other specified peripheral vascular diseases Essential (primary) hypertension Chronic kidney disease, stage 4 (severe) Plan Wound Cleansing: Wound #2 Left Calcaneus: May Shower, gently pat wound dry prior to applying new dressing. Anesthetic (add to Medication List): Wound #2 Left Calcaneus: Topical Lidocaine 4% cream applied to wound bed prior to debridement (In Clinic Only). - in clinic only Primary Wound Dressing: Wound #2 Left Calcaneus: Silver Collagen - moisten with normal saline Secondary Dressing: Wound #2 Left Calcaneus: Boardered Foam Dressing CHIN, WACHTER R. ([id number]) Dressing Change Frequency: Wound #2 Left Calcaneus: Change Dressing Monday, Wednesday, Friday Follow-up Appointments: Wound #2 Left Calcaneus: Return Appointment in 1 week. Edema Control: Wound #2 Left Calcaneus: Elevate legs to the level of the heart and pump ankles as often as possible Off-Loading: Wound #2 Left Calcaneus: Turn and reposition every 2 hours Home Health: Wound #2 Left Calcaneus: Continue Home Health Visits - [facility] Nurse may visit PRN to address patient s wound care needs. FACE TO FACE ENCOUNTER: MEDICARE and MEDICAID PATIENTS: I certify that this patient is  under my care and that I had a face-to-face encounter that meets the physician face-to-face encounter requirements with this patient on this date. The encounter with the patient was in whole or in part for the following MEDICAL CONDITION: (primary reason for [place]) MEDICAL NECESSITY: I certify, that based on my findings, NURSING services are a medically necessary home health service. HOME BOUND STATUS: I certify that  my clinical findings support that this patient is homebound (i.e., Due to illness or injury, pt requires aid of supportive devices such as crutches, cane, wheelchairs, walkers, the use of special transportation or the assistance of another person to leave their place of residence. There is a normal inability to leave the home and doing so requires considerable and taxing effort. Other absences are for medical reasons / religious services and are infrequent or of short duration when for other reasons). If current dressing causes regression in wound condition, may D/C ordered dressing product/s and apply Normal Saline Moist Dressing daily until next [place] / Other MD appointment. Harvey of regression in wound condition at [phone]. Please direct any NON-WOUND related issues/requests for orders to patient's Primary Care Physician #1 continue with silver collagen and border foam 2. I am uncertain how well he is offloading this area. He lives at [facility] assisted [facility]. 3. He has noncompressible ABIs however his peripheral pulses are palpable I am doubtful that PAD is playing a major role here Electronic Signature(s) Signed: 10/21/2019 5:28:47 PM By: Linton Ham MD Entered By: Linton Ham on 10/21/2019 13:10:49 Devilla, [name] ([id number]) -------------------------------------------------------------------------------- SuperBill Details Patient Name: [name] Date of Service: 10/21/2019 Medical Record Number:  [id number] Patient Account Number: [id number] Date of Birth/Sex: May 03, 1932 (87 y.o. M) Treating RN: Army Melia Primary Care Provider: [name] Other Clinician: Referring Provider: [name] Treating Provider/Extender: Tito Dine in Treatment: 8 Diagnosis Coding ICD-10 Codes Code Description E11.621 Type 2 diabetes mellitus with foot ulcer S40.811D Abrasion of right upper arm, subsequent encounter L89.620 Pressure ulcer of left heel, unstageable I73.89 Other specified peripheral vascular diseases I10 Essential (primary) hypertension N18.4 Chronic kidney disease, stage 4 (severe) Facility Procedures CPT4 Code: 83358251 Description: 99213 - WOUND CARE VISIT-LEV 3 EST PT Modifier: Quantity: 1 Physician Procedures CPT4 Code: 8984210 Description: 31281 - WC PHYS LEVEL 3 - EST PT Modifier: Quantity: 1 CPT4 Code: Description: ICD-10 Diagnosis Description L89.620 Pressure ulcer of left heel, unstageable Modifier: Quantity: Electronic Signature(s) Signed: 10/21/2019 5:28:47 PM By: Linton Ham MD Entered By: Linton Ham on 10/21/2019 13:11:20

## 2019-10-22 NOTE — Progress Notes (Signed)
[name], [name] ([id number]) Visit Report for 10/21/2019 Arrival Information Details Patient Name: [name], [name] Date of Service: 10/21/2019 12:30 PM Medical Record Number: [id number] Patient Account Number: [id number] Date of Birth/Sex: [birth year] (86 y.o. M) Treating RN: Army Melia Primary Care Suman Trivedi: [name] Other Clinician: Referring Reghan Thul: [name] Treating Zurie Platas/Extender: Tito Dine in Treatment: 8 Visit Information History Since Last Visit Added or deleted any medications: No Patient Arrived: Walker Any new allergies or adverse reactions: No Arrival Time: 12:41 Had a fall or experienced change in No Accompanied By: daughter activities of daily living that may affect Transfer Assistance: None risk of falls: Patient Identification Verified: Yes Signs or symptoms of abuse/neglect since last visito No Secondary Verification Process Completed: Yes Hospitalized since last visit: No Implantable device outside of the clinic excluding No cellular tissue based products placed in the center since last visit: Has Dressing in Place as Prescribed: Yes Pain Present Now: No Electronic Signature(s) Signed: 10/21/2019 4:38:04 PM By: Lorine Bears RCP, RRT, CHT Entered By: Lorine Bears on 10/21/2019 12:42:36 [name], [name] ([id number]) -------------------------------------------------------------------------------- Clinic Level of Care Assessment Details Patient Name: [name] Date of Service: 10/21/2019 12:30 PM Medical Record Number: [id number] Patient Account Number: [id number] Date of Birth/Sex: January 09, 1932 (86 y.o. M) Treating RN: Army Melia Primary Care Shamar Engelmann: [name] Other Clinician: Referring Corin Tilly: [name] Treating Pratham Cassatt/Extender: Tito Dine in Treatment: 8 Clinic Level of Care Assessment Items TOOL 4 Quantity Score []  - Use when only an EandM is performed on FOLLOW-UP visit  0 ASSESSMENTS - Nursing Assessment / Reassessment X - Reassessment of Co-morbidities (includes updates in patient status) 1 10 X- 1 5 Reassessment of Adherence to Treatment Plan ASSESSMENTS - Wound and Skin Assessment / Reassessment []  - Simple Wound Assessment / Reassessment - one wound 0 X- 2 5 Complex Wound Assessment / Reassessment - multiple wounds []  - 0 Dermatologic / Skin Assessment (not related to wound area) ASSESSMENTS - Focused Assessment []  - Circumferential Edema Measurements - multi extremities 0 []  - 0 Nutritional Assessment / Counseling / Intervention []  - 0 Lower Extremity Assessment (monofilament, tuning fork, pulses) []  - 0 Peripheral Arterial Disease Assessment (using hand held doppler) ASSESSMENTS - Ostomy and/or Continence Assessment and Care []  - Incontinence Assessment and Management 0 []  - 0 Ostomy Care Assessment and Management (repouching, etc.) PROCESS - Coordination of Care X - Simple Patient / Family Education for ongoing care 1 15 []  - 0 Complex (extensive) Patient / Family Education for ongoing care []  - 0 Staff obtains Programmer, systems, Records, Test Results / Process Orders []  - 0 Staff telephones HHA, Nursing Homes / Clarify orders / etc []  - 0 Routine Transfer to another Facility (non-emergent condition) []  - 0 Routine Hospital Admission (non-emergent condition) []  - 0 New Admissions / Biomedical engineer / Ordering NPWT, Apligraf, etc. []  - 0 Emergency Hospital Admission (emergent condition) X- 1 10 Simple Discharge Coordination []  - 0 Complex (extensive) Discharge Coordination PROCESS - Special Needs []  - Pediatric / Minor Patient Management 0 []  - 0 Isolation Patient Management []  - 0 Hearing / Language / Visual special needs []  - 0 Assessment of Community assistance (transportation, D/C planning, etc.) []  - 0 Additional assistance / Altered mentation []  - 0 Support Surface(s) Assessment (bed, cushion, seat,  etc.) INTERVENTIONS - Wound Cleansing / Measurement Marsicano, Jomes R. ([id number]) []  - 0 Simple Wound Cleansing - one wound X- 2 5 Complex Wound Cleansing - multiple wounds X- 1  5 Wound Imaging (photographs - any number of wounds) []  - 0 Wound Tracing (instead of photographs) []  - 0 Simple Wound Measurement - one wound X- 2 5 Complex Wound Measurement - multiple wounds INTERVENTIONS - Wound Dressings []  - Small Wound Dressing one or multiple wounds 0 X- 1 15 Medium Wound Dressing one or multiple wounds []  - 0 Large Wound Dressing one or multiple wounds []  - 0 Application of Medications - topical []  - 0 Application of Medications - injection INTERVENTIONS - Miscellaneous []  - External ear exam 0 []  - 0 Specimen Collection (cultures, biopsies, blood, body fluids, etc.) []  - 0 Specimen(s) / Culture(s) sent or taken to Lab for analysis []  - 0 Patient Transfer (multiple staff / Civil Service fast streamer / Similar devices) []  - 0 Simple Staple / Suture removal (25 or less) []  - 0 Complex Staple / Suture removal (26 or more) []  - 0 Hypo / Hyperglycemic Management (close monitor of Blood Glucose) []  - 0 Ankle / Brachial Index (ABI) - do not check if billed separately X- 1 5 Vital Signs Has the patient been seen at the hospital within the last three years: Yes Total Score: 95 Level Of Care: New/Established - Level 3 Electronic Signature(s) Signed: 10/21/2019 4:45:22 PM By: Army Melia Entered By: Army Melia on 10/21/2019 13:03:42 [name], [name] ([id number]) -------------------------------------------------------------------------------- Encounter Discharge Information Details Patient Name: [name] Date of Service: 10/21/2019 12:30 PM Medical Record Number: [id number] Patient Account Number: [id number] Date of Birth/Sex: [birth year] (86 y.o. M) Treating RN: Army Melia Primary Care Bernice Mcauliffe: [name] Other Clinician: Referring Vitaly Wanat: [name] Treating  Adaria Hole/Extender: Tito Dine in Treatment: 8 Encounter Discharge Information Items Discharge Condition: Stable Ambulatory Status: Walker Discharge Destination: [place] Telephoned: No Orders Sent: Yes Transportation: Private Auto Accompanied By: daughter Schedule Follow-up Appointment: Yes Clinical Summary of Care: Electronic Signature(s) Signed: 10/21/2019 4:45:22 PM By: Army Melia Entered By: Army Melia on 10/21/2019 13:04:46 [name], [name] ([id number]) -------------------------------------------------------------------------------- Lower Extremity Assessment Details Patient Name: [name] Date of Service: 10/21/2019 12:30 PM Medical Record Number: [id number] Patient Account Number: [id number] Date of Birth/Sex: 1932-04-09 (87 y.o. M) Treating RN: Montey Hora Primary Care Fawzi Melman: [name] Other Clinician: Referring Tyla Burgner: [name] Treating Marijah Larranaga/Extender: Ricard Dillon Weeks in Treatment: 8 Edema Assessment Assessed: [Left: No] [Right: No] Edema: [Left: N] [Right: o] Vascular Assessment Pulses: Dorsalis Pedis Palpable: [Left:Yes] Electronic Signature(s) Signed: 10/21/2019 5:03:46 PM By: Montey Hora Entered By: Montey Hora on 10/21/2019 12:46:43 [name], [name] ([id number]) -------------------------------------------------------------------------------- Multi Wound Chart Details Patient Name: [name] Date of Service: 10/21/2019 12:30 PM Medical Record Number: [id number] Patient Account Number: [id number] Date of Birth/Sex: June 05, 1932 (86 y.o. M) Treating RN: Army Melia Primary Care Malikiah Debarr: [name] Other Clinician: Referring Elza Varricchio: [name] Treating Marra Fraga/Extender: Tito Dine in Treatment: 8 Vital Signs Height(in): 61 Pulse(bpm): 56 Weight(lbs): 145 Blood Pressure(mmHg): 141/59 Body Mass Index(BMI): 22 Temperature(F): 97.6 Respiratory Rate(breaths/min):  18 Photos: [N/A:N/A] Wound Location: Left Calcaneus Right Upper Arm N/A Wounding Event: Gradually Appeared Trauma N/A Primary Etiology: Pressure Ulcer Skin Tear N/A Comorbid History: Cataracts, Hypertension, Peripheral Cataracts, Hypertension, Peripheral N/A Venous Disease, Type II Diabetes, Venous Disease, Type II Diabetes, End Stage Renal Disease End Stage Renal Disease Date Acquired: 07/26/2019 09/26/2019 N/A Weeks of Treatment: 8 3 N/A Wound Status: Open Healed - Epithelialized N/A Measurements L x W x D (cm) 0.6x0.9x0.3 0x0x0 N/A Area (cm) : 0.424 0 N/A Volume (cm) : 0.127 0 N/A %  Reduction in Area: 70.30% 100.00% N/A % Reduction in Volume: 11.20% 100.00% N/A Classification: Category/Stage III Full Thickness Without Exposed N/A Support Structures Exudate Amount: Medium None Present N/A Exudate Type: Serous N/A N/A Exudate Color: amber N/A N/A Wound Margin: Flat and Intact Flat and Intact N/A Granulation Amount: Large (67-100%) None Present (0%) N/A Granulation Quality: Pink N/A N/A Necrotic Amount: Small (1-33%) Large (67-100%) N/A Necrotic Tissue: Adherent Slough Eschar N/A Exposed Structures: Fat Layer (Subcutaneous Tissue) Fat Layer (Subcutaneous Tissue) N/A Exposed: Yes Exposed: Yes Fascia: No Fascia: No Tendon: No Tendon: No Muscle: No Muscle: No Joint: No Joint: No Bone: No Bone: No Epithelialization: None Large (67-100%) N/A Treatment Notes Wound #2 (Left Calcaneus) Notes prisma, BFD to heel FORD, PEDDIE ([id number]) Electronic Signature(s) Signed: 10/21/2019 5:28:47 PM By: Linton Ham MD Entered By: Linton Ham on 10/21/2019 13:05:48 Granlund, [name] ([id number]) -------------------------------------------------------------------------------- [place] Details Patient Name: [name] Date of Service: 10/21/2019 12:30 PM Medical Record Number: [id number] Patient Account Number: [id number] Date of Birth/Sex: 06-01-1932 (86 y.o.  M) Treating RN: Army Melia Primary Care Fenris Cauble: [name] Other Clinician: Referring Thersia Petraglia: [name] Treating Phil Michels/Extender: Tito Dine in Treatment: 8 Active Inactive Abuse / Safety / Falls / Self Care Management Nursing Diagnoses: Impaired physical mobility Goals: Patient/caregiver will verbalize understanding of skin care regimen Date Initiated: 08/26/2019 Target Resolution Date: 09/08/2019 Goal Status: Active Interventions: Assess fall risk on admission and as needed Notes: Orientation to the Wound Care Program Nursing Diagnoses: Knowledge deficit related to the wound healing center program Goals: Patient/caregiver will verbalize understanding of the [place] Date Initiated: 08/26/2019 Target Resolution Date: 09/08/2019 Goal Status: Active Interventions: Provide education on orientation to the wound center Notes: Pressure Nursing Diagnoses: Knowledge deficit related to causes and risk factors for pressure ulcer development Goals: Patient will remain free from development of additional pressure ulcers Date Initiated: 08/26/2019 Target Resolution Date: 09/08/2019 Goal Status: Active Interventions: Assess: immobility, friction, shearing, incontinence upon admission and as needed Notes: Wound/Skin Impairment Nursing Diagnoses: Impaired tissue integrity Goals: Ulcer/skin breakdown will have a volume reduction of 30% by week 4 Date Initiated: 08/26/2019 Target Resolution Date: 09/26/2019 Goal Status: Active [name], STANN ([id number]) Interventions: Assess patient/caregiver ability to obtain necessary supplies Treatment Activities: Skin care regimen initiated : 08/26/2019 Notes: Electronic Signature(s) Signed: 10/21/2019 4:45:22 PM By: Army Melia Entered By: Army Melia on 10/21/2019 13:02:00 [name], [name] ([id number]) -------------------------------------------------------------------------------- Pain Assessment  Details Patient Name: [name] Date of Service: 10/21/2019 12:30 PM Medical Record Number: [id number] Patient Account Number: [id number] Date of Birth/Sex: 08-16-1931 (86 y.o. M) Treating RN: Montey Hora Primary Care Ailey Wessling: [name] Other Clinician: Referring Niana Martorana: [name] Treating Genova Kiner/Extender: Tito Dine in Treatment: 8 Active Problems Location of Pain Severity and Description of Pain Patient Has Paino No Site Locations Pain Management and Medication Current Pain Management: Electronic Signature(s) Signed: 10/21/2019 5:03:46 PM By: Montey Hora Entered By: Montey Hora on 10/21/2019 12:46:16 [name], [name] ([id number]) -------------------------------------------------------------------------------- Patient/Caregiver Education Details Patient Name: [name] Date of Service: 10/21/2019 12:30 PM Medical Record Number: [id number] Patient Account Number: [id number] Date of Birth/Gender: [birth year] (86 y.o. M) Treating RN: Army Melia Primary Care Physician: [name] Other Clinician: Referring Physician: [name] Treating Physician/Extender: Tito Dine in Treatment: 8 Education Assessment Education Provided To: Patient Education Topics Provided Wound/Skin Impairment: Handouts: Caring for Your Ulcer Methods: Demonstration, Explain/Verbal Responses: State content correctly Electronic Signature(s) Signed: 10/21/2019 4:45:22 PM By:  Nicki Reaper, Dajea Entered By: Army Melia on 10/21/2019 13:03:57 [name], [name] ([id number]) -------------------------------------------------------------------------------- Wound Assessment Details Patient Name: [name], [name] Date of Service: 10/21/2019 12:30 PM Medical Record Number: [id number] Patient Account Number: [id number] Date of Birth/Sex: Aug 21, 1931 (86 y.o. M) Treating RN: Montey Hora Primary Care Sander Remedios: [name] Other Clinician: Referring Zakyia Gagan: [name] Treating Helaman Mecca/Extender: Tito Dine in Treatment: 8 Wound Status Wound Number: 2 Primary Pressure Ulcer Etiology: Wound Location: Left Calcaneus Wound Open Wounding Event: Gradually Appeared Status: Date Acquired: 07/26/2019 Comorbid Cataracts, Hypertension, Peripheral Venous Disease, Weeks Of Treatment: 8 History: Type II Diabetes, End Stage Renal Disease Clustered Wound: No Photos Wound Measurements Length: (cm) 0.6 % Re Width: (cm) 0.9 % Re Depth: (cm) 0.3 Epit Area: (cm) 0.424 Tun Volume: (cm) 0.127 Und duction in Area: 70.3% duction in Volume: 11.2% helialization: None neling: No ermining: No Wound Description Classification: Category/Stage III Foul Wound Margin: Flat and Intact Slou Exudate Amount: Medium Exudate Type: Serous Exudate Color: amber Odor After Cleansing: No gh/Fibrino Yes Wound Bed Granulation Amount: Large (67-100%) Exposed Structure Granulation Quality: Pink Fascia Exposed: No Necrotic Amount: Small (1-33%) Fat Layer (Subcutaneous Tissue) Exposed: Yes Necrotic Quality: Adherent Slough Tendon Exposed: No Muscle Exposed: No Joint Exposed: No Bone Exposed: No Treatment Notes Wound #2 (Left Calcaneus) Notes prisma, BFD to heel Electronic Signature(s) Signed: 10/21/2019 5:03:46 PM By: Barrett Henle, [name] ([id number]) Entered By: Montey Hora on 10/21/2019 12:51:32 Stoneberg, [name] ([id number]) -------------------------------------------------------------------------------- Wound Assessment Details Patient Name: [name] Date of Service: 10/21/2019 12:30 PM Medical Record Number: [id number] Patient Account Number: [id number] Date of Birth/Sex: 1931-11-18 (86 y.o. M) Treating RN: Army Melia Primary Care Dvontae Ruan: [name] Other Clinician: Referring Kimiko Common: [name] Treating Rocky Rishel/Extender: Tito Dine in Treatment: 8 Wound Status Wound Number: 4 Primary Skin  Tear Etiology: Wound Location: Right Upper Arm Wound Healed - Epithelialized Wounding Event: Trauma Status: Date Acquired: 09/26/2019 Comorbid Cataracts, Hypertension, Peripheral Venous Disease, Weeks Of Treatment: 3 History: Type II Diabetes, End Stage Renal Disease Clustered Wound: No Photos Wound Measurements Length: (cm) 0 Width: (cm) 0 Depth: (cm) 0 Area: (cm) Volume: (cm) % Reduction in Area: 100% % Reduction in Volume: 100% Epithelialization: Large (67-100%) 0 Tunneling: No 0 Undermining: No Wound Description Classification: Full Thickness Without Exposed Support Structures Wound Margin: Flat and Intact Exudate Amount: None Present Foul Odor After Cleansing: No Slough/Fibrino No Wound Bed Granulation Amount: None Present (0%) Exposed Structure Necrotic Amount: Large (67-100%) Fascia Exposed: No Necrotic Quality: Eschar Fat Layer (Subcutaneous Tissue) Exposed: Yes Tendon Exposed: No Muscle Exposed: No Joint Exposed: No Bone Exposed: No Electronic Signature(s) Signed: 10/21/2019 4:45:22 PM By: Army Melia Entered By: Army Melia on 10/21/2019 13:01:05 Billard, [name] ([id number]) -------------------------------------------------------------------------------- Vitals Details Patient Name: [name] Date of Service: 10/21/2019 12:30 PM Medical Record Number: [id number] Patient Account Number: [id number] Date of Birth/Sex: 1931-07-01 (86 y.o. M) Treating RN: Army Melia Primary Care Emil Weigold: [name] Other Clinician: Referring Marshella Tello: [name] Treating Moe Graca/Extender: Tito Dine in Treatment: 8 Vital Signs Time Taken: 12:40 Temperature (F): 97.6 Height (in): 68 Pulse (bpm): 79 Weight (lbs): 145 Respiratory Rate (breaths/min): 18 Body Mass Index (BMI): 22 Blood Pressure (mmHg): 141/59 Reference Range: 80 - 120 mg / dl Electronic Signature(s) Signed: 10/21/2019 4:38:04 PM By: Lorine Bears RCP, RRT,  CHT Entered By: Becky Sax, Amado Nash on 10/21/2019 12:43:22

## 2019-10-28 ENCOUNTER — Other Ambulatory Visit: Payer: Self-pay

## 2019-10-28 ENCOUNTER — Encounter: Payer: Medicare Other | Admitting: Internal Medicine

## 2019-10-28 DIAGNOSIS — E11621 Type 2 diabetes mellitus with foot ulcer: Secondary | ICD-10-CM | POA: Diagnosis not present

## 2019-10-29 NOTE — Progress Notes (Signed)
[name], [name] ([id number]) Visit Report for 10/28/2019 Arrival Information Details Patient Name: [name] Date of Service: 10/28/2019 1:45 PM Medical Record Number: [id number] Patient Account Number: [id number] Date of Birth/Sex: 1931/12/14 (86 y.o. M) Treating RN: Army Melia Primary Care Clois Montavon: [name] Other Clinician: Referring Raymonde Hamblin: [name] Treating Shacora Zynda/Extender: Tito Dine in Treatment: 9 Visit Information History Since Last Visit Added or deleted any medications: No Patient Arrived: Walker Any new allergies or adverse reactions: No Arrival Time: 14:00 Had a fall or experienced change in No Accompanied By: daughter activities of daily living that may affect Transfer Assistance: None risk of falls: Patient Identification Verified: Yes Signs or symptoms of abuse/neglect since last visito No Secondary Verification Process Completed: Yes Hospitalized since last visit: No Implantable device outside of the clinic excluding No cellular tissue based products placed in the center since last visit: Has Dressing in Place as Prescribed: Yes Pain Present Now: No Electronic Signature(s) Signed: 10/28/2019 5:33:40 PM By: Lorine Bears RCP, RRT, CHT Entered By: Lorine Bears on 10/28/2019 14:00:55 [name], [name] ([id number]) -------------------------------------------------------------------------------- Encounter Discharge Information Details Patient Name: [name] Date of Service: 10/28/2019 1:45 PM Medical Record Number: [id number] Patient Account Number: [id number] Date of Birth/Sex: Jan 01, 1932 (87 y.o. M) Treating RN: Army Melia Primary Care Margaret Cockerill: [name] Other Clinician: Referring Nazariah Cadet: [name] Treating Melessia Kaus/Extender: Tito Dine in Treatment: 9 Encounter Discharge Information Items Post Procedure Vitals Discharge Condition: Stable Temperature (F):  97.6 Ambulatory Status: Wheelchair Pulse (bpm): 79 Discharge Destination: Home Respiratory Rate (breaths/min): 16 Transportation: Private Auto Blood Pressure (mmHg): 161/61 Accompanied By: daughter Schedule Follow-up Appointment: Yes Clinical Summary of Care: Electronic Signature(s) Signed: 10/28/2019 5:18:09 PM By: Army Melia Entered By: Army Melia on 10/28/2019 15:01:32 [name], [name] ([id number]) -------------------------------------------------------------------------------- Lower Extremity Assessment Details Patient Name: [name] Date of Service: 10/28/2019 1:45 PM Medical Record Number: [id number] Patient Account Number: [id number] Date of Birth/Sex: 1931/09/06 (87 y.o. M) Treating RN: Montey Hora Primary Care Marty Sadlowski: [name] Other Clinician: Referring Norwood Quezada: [name] Treating Lyndon Chapel/Extender: Ricard Dillon Weeks in Treatment: 9 Edema Assessment Assessed: [Left: No] [Right: No] Edema: [Left: Ye] [Right: s] Vascular Assessment Pulses: Dorsalis Pedis Palpable: [Left:Yes] Electronic Signature(s) Signed: 10/28/2019 5:37:23 PM By: Montey Hora Entered By: Montey Hora on 10/28/2019 14:12:58 [name], [name] ([id number]) -------------------------------------------------------------------------------- Multi Wound Chart Details Patient Name: [name] Date of Service: 10/28/2019 1:45 PM Medical Record Number: [id number] Patient Account Number: [id number] Date of Birth/Sex: 02-02-1932 (86 y.o. M) Treating RN: Army Melia Primary Care Ashey Tramontana: [name] Other Clinician: Referring Darchelle Nunes: [name] Treating Jaena Brocato/Extender: Tito Dine in Treatment: 9 Vital Signs Height(in): 68 Pulse(bpm): 35 Weight(lbs): 145 Blood Pressure(mmHg): 161/61 Body Mass Index(BMI): 22 Temperature(F): 97.6 Respiratory Rate(breaths/min): 16 Photos: [N/A:N/A] Wound Location: Left Calcaneus N/A N/A Wounding Event: Gradually  Appeared N/A N/A Primary Etiology: Pressure Ulcer N/A N/A Comorbid History: Cataracts, Hypertension, Peripheral N/A N/A Venous Disease, Type II Diabetes, End Stage Renal Disease Date Acquired: 07/26/2019 N/A N/A Weeks of Treatment: 9 N/A N/A Wound Status: Open N/A N/A Measurements L x W x D (cm) 0.5x0.9x0.3 N/A N/A Area (cm) : 0.353 N/A N/A Volume (cm) : 0.106 N/A N/A % Reduction in Area: 75.30% N/A N/A % Reduction in Volume: 25.90% N/A N/A Classification: Category/Stage III N/A N/A Exudate Amount: Medium N/A N/A Exudate Type: Serous N/A N/A Exudate Color: amber N/A N/A Wound Margin: Flat and Intact N/A N/A Granulation Amount: Large (67-100%) N/A N/A Granulation  Quality: Pink N/A N/A Necrotic Amount: Small (1-33%) N/A N/A Exposed Structures: Fat Layer (Subcutaneous Tissue) N/A N/A Exposed: Yes Fascia: No Tendon: No Muscle: No Joint: No Bone: No Epithelialization: None N/A N/A Debridement: Debridement - Excisional N/A N/A Pre-procedure Verification/Time 14:56 N/A N/A Out Taken: Pain Control: Lidocaine N/A N/A Tissue Debrided: Subcutaneous, Slough N/A N/A Level: Skin/Subcutaneous Tissue N/A N/A Debridement Area (sq cm): 0.45 N/A N/A Instrument: Curette N/A N/A Bleeding: Minimum N/A N/A Hemostasis Achieved: Silver Nitrate N/A N/A Debridement Treatment Procedure was tolerated well N/A N/A Response: [name], [name] ([id number]) Post Debridement 0.5x0.9x0.3 N/A N/A Measurements L x W x D (cm) Post Debridement Volume: 0.106 N/A N/A (cm) Post Debridement Stage: Category/Stage III N/A N/A Procedures Performed: Debridement N/A N/A Treatment Notes Wound #2 (Left Calcaneus) Notes prisma, BFD to heel Electronic Signature(s) Signed: 10/28/2019 5:46:06 PM By: Linton Ham MD Entered By: Linton Ham on 10/28/2019 15:25:22 [name], [name] ([id number]) -------------------------------------------------------------------------------- Multi-Disciplinary Care Plan  Details Patient Name: [name] Date of Service: 10/28/2019 1:45 PM Medical Record Number: [id number] Patient Account Number: [id number] Date of Birth/Sex: Jun 16, 1931 (86 y.o. M) Treating RN: Army Melia Primary Care Aizah Gehlhausen: [name] Other Clinician: Referring Makynleigh Breslin: [name] Treating Elisandro Jarrett/Extender: Tito Dine in Treatment: 9 Active Inactive Abuse / Safety / Falls / Self Care Management Nursing Diagnoses: Impaired physical mobility Goals: Patient/caregiver will verbalize understanding of skin care regimen Date Initiated: 08/26/2019 Target Resolution Date: 09/08/2019 Goal Status: Active Interventions: Assess fall risk on admission and as needed Notes: Orientation to the Wound Care Program Nursing Diagnoses: Knowledge deficit related to the wound healing center program Goals: Patient/caregiver will verbalize understanding of the [place] Date Initiated: 08/26/2019 Target Resolution Date: 09/08/2019 Goal Status: Active Interventions: Provide education on orientation to the wound center Notes: Pressure Nursing Diagnoses: Knowledge deficit related to causes and risk factors for pressure ulcer development Goals: Patient will remain free from development of additional pressure ulcers Date Initiated: 08/26/2019 Target Resolution Date: 09/08/2019 Goal Status: Active Interventions: Assess: immobility, friction, shearing, incontinence upon admission and as needed Notes: Wound/Skin Impairment Nursing Diagnoses: Impaired tissue integrity Goals: Ulcer/skin breakdown will have a volume reduction of 30% by week 4 Date Initiated: 08/26/2019 Target Resolution Date: 09/26/2019 Goal Status: Active ASHTEN, PRATS ([id number]) Interventions: Assess patient/caregiver ability to obtain necessary supplies Treatment Activities: Skin care regimen initiated : 08/26/2019 Notes: Electronic Signature(s) Signed: 10/28/2019 5:18:09 PM By: Army Melia Entered By: Army Melia on 10/28/2019 14:57:18 Darin, [name] ([id number]) -------------------------------------------------------------------------------- Pain Assessment Details Patient Name: [name] Date of Service: 10/28/2019 1:45 PM Medical Record Number: [id number] Patient Account Number: [id number] Date of Birth/Sex: 02-Jul-1931 (86 y.o. M) Treating RN: Montey Hora Primary Care Finola Rosal: [name] Other Clinician: Referring Maynard David: [name] Treating Bryana Froemming/Extender: Tito Dine in Treatment: 9 Active Problems Location of Pain Severity and Description of Pain Patient Has Paino No Site Locations Pain Management and Medication Current Pain Management: Electronic Signature(s) Signed: 10/28/2019 5:37:23 PM By: Montey Hora Entered By: Montey Hora on 10/28/2019 14:06:52 Madore, [name] ([id number]) -------------------------------------------------------------------------------- Patient/Caregiver Education Details Patient Name: [name] Date of Service: 10/28/2019 1:45 PM Medical Record Number: [id number] Patient Account Number: [id number] Date of Birth/Gender: 1932/01/31 (86 y.o. M) Treating RN: Army Melia Primary Care Physician: [name] Other Clinician: Referring Physician: [name] Treating Physician/Extender: Tito Dine in Treatment: 9 Education Assessment Education Provided To: Patient Education Topics Provided Wound/Skin Impairment: Handouts: Caring for Your Ulcer Methods: Demonstration, Explain/Verbal Responses:  State content correctly Electronic Signature(s) Signed: 10/28/2019 5:18:09 PM By: Army Melia Entered By: Army Melia on 10/28/2019 15:00:53 OTHEL, HOOGENDOORN ([id number]) -------------------------------------------------------------------------------- Wound Assessment Details Patient Name: [name] Date of Service: 10/28/2019 1:45 PM Medical Record Number: [id number] Patient  Account Number: [id number] Date of Birth/Sex: 1931/07/13 (87 y.o. M) Treating RN: Montey Hora Primary Care Vonnetta Akey: [name] Other Clinician: Referring Naomia Lenderman: [name] Treating Meghana Tullo/Extender: Tito Dine in Treatment: 9 Wound Status Wound Number: 2 Primary Pressure Ulcer Etiology: Wound Location: Left Calcaneus Wound Open Wounding Event: Gradually Appeared Status: Date Acquired: 07/26/2019 Comorbid Cataracts, Hypertension, Peripheral Venous Disease, Weeks Of Treatment: 9 History: Type II Diabetes, End Stage Renal Disease Clustered Wound: No Photos Wound Measurements Length: (cm) 0.5 % Red Width: (cm) 0.9 % Red Depth: (cm) 0.3 Epith Area: (cm) 0.353 Tunn Volume: (cm) 0.106 Unde uction in Area: 75.3% uction in Volume: 25.9% elialization: None eling: No rmining: No Wound Description Classification: Category/Stage III Foul Wound Margin: Flat and Intact Slou Exudate Amount: Medium Exudate Type: Serous Exudate Color: amber Odor After Cleansing: No gh/Fibrino Yes Wound Bed Granulation Amount: Large (67-100%) Exposed Structure Granulation Quality: Pink Fascia Exposed: No Necrotic Amount: Small (1-33%) Fat Layer (Subcutaneous Tissue) Exposed: Yes Necrotic Quality: Adherent Slough Tendon Exposed: No Muscle Exposed: No Joint Exposed: No Bone Exposed: No Treatment Notes Wound #2 (Left Calcaneus) Notes prisma, BFD to heel Electronic Signature(s) Signed: 10/28/2019 5:37:23 PM By: Noreene Larsson ([id number]) Entered By: Montey Hora on 10/28/2019 14:11:19 Plantz, [name] ([id number]) -------------------------------------------------------------------------------- Vitals Details Patient Name: [name] Date of Service: 10/28/2019 1:45 PM Medical Record Number: [id number] Patient Account Number: [id number] Date of Birth/Sex: [birth year] (86 y.o. M) Treating RN: Army Melia Primary Care Leyan Branden: [name] Other  Clinician: Referring Manali Mcelmurry: [name] Treating Wrigley Winborne/Extender: Tito Dine in Treatment: 9 Vital Signs Time Taken: 13:55 Temperature (F): 97.6 Height (in): 68 Pulse (bpm): 79 Weight (lbs): 145 Respiratory Rate (breaths/min): 16 Body Mass Index (BMI): 22 Blood Pressure (mmHg): 161/61 Reference Range: 80 - 120 mg / dl Electronic Signature(s) Signed: 10/28/2019 5:33:40 PM By: Lorine Bears RCP, RRT, CHT Entered By: Lorine Bears on 10/28/2019 14:01:35

## 2019-10-29 NOTE — Progress Notes (Signed)
[name], [name] ([id number]) Visit Report for 10/28/2019 Debridement Details Patient Name: [name], [name] Date of Service: 10/28/2019 1:45 PM Medical Record Number: [id number] Patient Account Number: [id number] Date of Birth/Sex: [birth year] (86 y.o. M) Treating RN: Army Melia Primary Care Provider: [name] Other Clinician: Referring Provider: [name] Treating Provider/Extender: Tito Dine in Treatment: 9 Debridement Performed for Wound #2 Left Calcaneus Assessment: Performed By: Physician Ricard Dillon, MD Debridement Type: Debridement Level of Consciousness (Pre- Responds to Painful Stimuli procedure): Pre-procedure Verification/Time Out Yes - 14:56 Taken: Start Time: 14:56 Pain Control: Lidocaine Total Area Debrided (L x W): 0.5 (cm) x 0.9 (cm) = 0.45 (cm) Tissue and other material Viable, Non-Viable, Slough, Subcutaneous, Slough debrided: Level: Skin/Subcutaneous Tissue Debridement Description: Excisional Instrument: Curette Bleeding: Minimum Hemostasis Achieved: Silver Nitrate End Time: 14:59 Response to Treatment: Procedure was tolerated well Level of Consciousness (Post- Awake and Alert procedure): Post Debridement Measurements of Total Wound Length: (cm) 0.5 Stage: Category/Stage III Width: (cm) 0.9 Depth: (cm) 0.3 Volume: (cm) 0.106 Character of Wound/Ulcer Post Debridement: Stable Post Procedure Diagnosis Same as Pre-procedure Electronic Signature(s) Signed: 10/28/2019 5:18:09 PM By: Army Melia Signed: 10/28/2019 5:46:06 PM By: Linton Ham MD Entered By: Linton Ham on 10/28/2019 15:25:38 [name], [name] ([id number]) -------------------------------------------------------------------------------- HPI Details Patient Name: [name] Date of Service: 10/28/2019 1:45 PM Medical Record Number: [id number] Patient Account Number: [id number] Date of Birth/Sex: 07/09/1931 (86 y.o. M) Treating RN: Army Melia Primary Care  Provider: [name] Other Clinician: Referring Provider: [name] Treating Provider/Extender: Tito Dine in Treatment: 9 History of Present Illness HPI Description: 05/19/18 on evaluation today patient presents for initial evaluation and clinic concerning issues that he has been having with his right medial lower leg ulcer. The patient has actually been seen in the emergency department for cellulitis he was admitted at [facility] on 04/19/18 discharged 04/21/18. He was noted to have proof of vascular disease and did undergo an angiogram on 04/20/18 by Dr. Leotis Pain. 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 Subsequently Dr. dew following intervention noted that the patient had depending on the location 20-40% residual stenosis noted and significant improvement in the overall vascular flow. Obviously this is great news. With that being said the patient's wound he states does seem to be doing some better compared to previous. Upon inspection indeed it appears that he has some epithelialization. Overall I do not see any evidence of significant infection which is great news. No fevers chills noted 05/26/18 on evaluation today patient's wound actually appears to be doing much better at this time. With that being said I do think the [facility] Dressing a be sticking according to my nurse and this may be causing some issues as well. I'm gonna suggest at this point that we likely add mepitel underneath the [facility] Dressing help prevent this. Other than that everything seems to be doing excellent. 06/02/18 on evaluation today patient actually appears to be doing decently well in regard to his right lower extremity ulcer. This seems to show signs of good improvement which is excellent news. With that being said he did see vascular the good news is no further  intervention is recommended nor necessary at this point. Actually placed in an [employer] and sent orders to home health which in the end it led to her switching the patient from the three  layer compression wrap of the right lower extremity to an [employer] wrap. I feel like he did better in the three layer compression. With that being said the patient currently shows no signs of worsening the mepitel did help prevent any sticking to the wound bed which is great news. 06/12/18 an evaluation today patient presents for follow-up concerning his right lower extremity ulceration. The medial aspect ulcer appears to be doing fairly well this time which is good news. He has a blister on the lateral aspect although I'm concerned this may actually be due to the fact that his wrap is sliding down. We seem to be having issues with getting this wrapped appropriately were it will stay in place. Fortunately there does not appear to be any signs of infection at this time which is good news. He is seen with his son present at this point during the visit. 06/16/18 on evaluation today patient appears to be doing rather well and in fact appears to be almost completely healed in regard to his lower extremity ulcer on the right. The wrap much better this week with [place] wrapping it and just keeping him in that same wrap until follow-up. For that reason we will discontinue home health services. Fortunately there does not appear to be the evidence of infection at this point. 06/23/18 on evaluation today patient appears to be doing very well in regard to his ulcer which in fact appears to be completely healed. He has been tolerating the wraps without complication although he is definitely ready to be out of these considering how well things have done. Fortunately there is not appear to be any signs of infection at this time which is great news. 08/26/2019 upon evaluation today patient appears to be doing somewhat poorly upon initial  inspection here in our clinic for his current issue. This is a patient that I previously taken care of with regard to wounds on his legs. With that being said the issues that were having right now are actually separate from that he has a heel ulcer unfortunately and then subsequently also does have a wound in the right gluteal region that appears to be more of a skin tear. The patient does have a history of diabetes mellitus type 2, peripheral vascular disease, hypertension, and chronic kidney disease stage IV. He is also somewhat weak though he does like to walk around which is actually a good thing he does so slowly. He is currently at an assisted living facility at this point. 09/02/2019 upon evaluation today patient appears to be doing well with regard to the wound on his left heel as well as the right gluteal region. He has had the arterial studies I did look up the numbers and it appears that his TBI's were actually pretty good bilaterally measuring about 1 on the left and around 0.64 on the right. With that being said the final report is not here yet we should have that by next week. Nonetheless looking at the results of the imaging I feel like he has good arterial flow to be able to proceed with debridement today especially on this left heel. 09/09/2019 upon evaluation today patient's wounds actually appear to be showing signs of improvement. In the gluteal/sacral region he is showing signs of significant improvement and in fact I really do not see much that is exactly open although there is a lot of moisture type breakdown that I do see at this point. Nonetheless I believe that the patient can  potentially transition from a dressing to just using a barrier cream in this area. In regard to the heel that it does appear to be showing signs of improvement I think the Iodoflex is doing a good job. 09/16/2019 upon evaluation today patient appears to be doing well with regard to his heel ulcer. He has been  tolerating the dressing changes without complication. Fortunately there is no signs of active infection at this point which is great news. There is some question about physical therapy whether he should be cutting back on this or not. With that being said it does not appear its causing any damage to his heel I think he can actually continue with therapy at this point. 09/23/2019 upon evaluation today patient appears to be doing excellent in regard to his heel ulcer. He has been tolerating the dressing changes without complication. Fortunately there is no signs of active infection at this time. No fevers, chills, nausea, vomiting, or diarrhea. 4/22; right heel ulcer at the tip of his heel. Raised edges nonviable surface. We have been using Iodoflex dimensions of the wound improving oThe patient had a fall this week. He had a skin tear on his right upper lateral humeral area. We are asked to look at this today 10/07/2019 upon evaluation today patient appears to be doing better with regard to his heel I think we may be ready to switch to a collagen-based dressing here. In regard to the skin tear unfortunately the think this is good to have to have some of the skin removed it does not seem to be healing as appropriately as we would like. Fortunately there is no signs of active infection at this time. [name], [name] ([id number]) 10/14/2019 patient appears to be doing well in regard to both his left heel ulcer as well as his right upper arm ulcer. He has been tolerating the dressing changes without complication. Fortunately there is no signs of active infection at this time. Overall I think the collagen has done well although it sticking somewhat to the arm at this point which I think may be slowing things down to some degree. Fortunately there is no signs of worsening at either location as it is anyway. 5/13; the patient had a wound on the right upper humerus this is fully closed. He has an area on the tip of  his left heel. Still open may be slightly smaller but not a lot. Still some depth. We are using silver collagen to this area 5/20 tip of his left heel. About the same as last time no real improvement. We have been using silver collagen. He has home health coming into the facility where he lives Electronic Signature(s) Signed: 10/28/2019 5:46:06 PM By: Linton Ham MD Entered By: Linton Ham on 10/28/2019 15:26:21 [name], [name] ([id number]) -------------------------------------------------------------------------------- Physical Exam Details Patient Name: [name] Date of Service: 10/28/2019 1:45 PM Medical Record Number: [id number] Patient Account Number: [id number] Date of Birth/Sex: 1932-01-18 (86 y.o. M) Treating RN: Army Melia Primary Care Provider: [name] Other Clinician: Referring Provider: [name] Treating Provider/Extender: Tito Dine in Treatment: 9 Constitutional Patient is hypertensive.. Pulse regular and within target range for patient.Marland Kitchen Respirations regular, non-labored and within target range.. Temperature is normal and within the target range for the patient.Marland Kitchen appears in no distress. Notes Wound exam; the only remaining wound is on the tip of the left heel. Not much change from last time. I removed subcutaneous debris from the surface of the wound as  well as skin and subcutaneous tissue from the circumference of the wound. Hemostasis with a pressure dressing Electronic Signature(s) Signed: 10/28/2019 5:46:06 PM By: Linton Ham MD Entered By: Linton Ham on 10/28/2019 15:27:42 [name], [name] ([id number]) -------------------------------------------------------------------------------- Physician Orders Details Patient Name: [name] Date of Service: 10/28/2019 1:45 PM Medical Record Number: [id number] Patient Account Number: [id number] Date of Birth/Sex: 1932/06/07 (86 y.o. M) Treating RN: Army Melia Primary Care  Provider: [name] Other Clinician: Referring Provider: [name] Treating Provider/Extender: Tito Dine in Treatment: 9 Verbal / Phone Orders: No Diagnosis Coding Wound Cleansing Wound #2 Left Calcaneus o May Shower, gently pat wound dry prior to applying new dressing. Anesthetic (add to Medication List) Wound #2 Left Calcaneus o Topical Lidocaine 4% cream applied to wound bed prior to debridement (In Clinic Only). - in clinic only Primary Wound Dressing Wound #2 Left Calcaneus o Silver Collagen - moisten with normal saline Secondary Dressing Wound #2 Left Calcaneus o Boardered Foam Dressing Dressing Change Frequency Wound #2 Left Calcaneus o Change Dressing Monday, Wednesday, Friday Follow-up Appointments Wound #2 Left Calcaneus o Return Appointment in 2 weeks. Edema Control Wound #2 Left Calcaneus o Elevate legs to the level of the heart and pump ankles as often as possible Off-Loading Wound #2 Left Calcaneus o Turn and reposition every 2 hours Home Health Wound #2 Left [place] Visits - [facility] o Home Health Nurse may visit PRN to address patientos wound care needs. o FACE TO FACE ENCOUNTER: MEDICARE and MEDICAID PATIENTS: I certify that this patient is under my care and that I had a face-to-face encounter that meets the physician face-to-face encounter requirements with this patient on this date. The encounter with the patient was in whole or in part for the following MEDICAL CONDITION: (primary reason for [place]) MEDICAL NECESSITY: I certify, that based on my findings, NURSING services are a medically necessary home health service. HOME BOUND STATUS: I certify that my clinical findings support that this patient is homebound (i.e., Due to illness or injury, pt requires aid of supportive devices such as crutches, cane, wheelchairs, walkers, the use of special transportation or the assistance  of another person to leave their place of residence. There is a normal inability to leave the home and doing so requires considerable and taxing effort. Other absences are for medical reasons / religious services and are infrequent or of short duration when for other reasons). o If current dressing causes regression in wound condition, may D/C ordered dressing product/s and apply Normal Saline Moist Dressing daily until next [place] / Other MD appointment. [place] of regression in wound condition at [phone]. o Please direct any NON-WOUND related issues/requests for orders to patient's Primary Care Physician [name], [name] ([id number]) Electronic Signature(s) Signed: 10/28/2019 5:18:09 PM By: Army Melia Signed: 10/28/2019 5:46:06 PM By: Linton Ham MD Entered By: Army Melia on 10/28/2019 15:00:19 [name], [name] ([id number]) -------------------------------------------------------------------------------- Problem List Details Patient Name: [name] Date of Service: 10/28/2019 1:45 PM Medical Record Number: [id number] Patient Account Number: [id number] Date of Birth/Sex: 07-May-1932 (86 y.o. M) Treating RN: Army Melia Primary Care Provider: [name] Other Clinician: Referring Provider: [name] Treating Provider/Extender: Tito Dine in Treatment: 9 Active Problems ICD-10 Encounter Code Description Active Date MDM Diagnosis E11.621 Type 2 diabetes mellitus with foot ulcer 08/26/2019 No Yes S40.811D Abrasion of right upper arm, subsequent encounter 09/30/2019 No Yes L89.620 Pressure ulcer of left heel,  unstageable 08/26/2019 No Yes I73.89 Other specified peripheral vascular diseases 08/26/2019 No Yes I10 Essential (primary) hypertension 08/26/2019 No Yes N18.4 Chronic kidney disease, stage 4 (severe) 08/26/2019 No Yes Inactive Problems ICD-10 Code Description Active Date Inactive Date S31.819A Unspecified open wound  of right buttock, initial encounter 08/26/2019 08/26/2019 Resolved Problems Electronic Signature(s) Signed: 10/28/2019 5:46:06 PM By: Linton Ham MD Entered By: Linton Ham on 10/28/2019 15:25:07 Lalley, [name] ([id number]) -------------------------------------------------------------------------------- Progress Note Details Patient Name: [name] Date of Service: 10/28/2019 1:45 PM Medical Record Number: [id number] Patient Account Number: [id number] Date of Birth/Sex: 1932-01-24 (86 y.o. M) Treating RN: Army Melia Primary Care Provider: [name] Other Clinician: Referring Provider: [name] Treating Provider/Extender: Tito Dine in Treatment: 9 Subjective History of Present Illness (HPI) 05/19/18 on evaluation today patient presents for initial evaluation and clinic concerning issues that he has been having with his right medial lower leg ulcer. The patient has actually been seen in the emergency department for cellulitis he was admitted at [facility] on 04/19/18 discharged 04/21/18. He was noted to have proof of vascular disease and did undergo an angiogram on 04/20/18 by Dr. Leotis Pain. 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 Subsequently Dr. dew following intervention noted that the patient had depending on the location 20-40% residual stenosis noted and significant improvement in the overall vascular flow. Obviously this is great news. With that being said the patient's wound he states does seem to be doing some better compared to previous. Upon inspection indeed it appears that he has some epithelialization. Overall I do not see any evidence of significant infection which is great news. No fevers chills noted 05/26/18 on evaluation today patient's wound actually appears to be doing much better at this time. With that being said I do think  the [facility] Dressing a be sticking according to my nurse and this may be causing some issues as well. I'm gonna suggest at this point that we likely add mepitel underneath the [facility] Dressing help prevent this. Other than that everything seems to be doing excellent. 06/02/18 on evaluation today patient actually appears to be doing decently well in regard to his right lower extremity ulcer. This seems to show signs of good improvement which is excellent news. With that being said he did see vascular the good news is no further intervention is recommended nor necessary at this point. Actually placed in an [employer] and sent orders to home health which in the end it led to her switching the patient from the three layer compression wrap of the right lower extremity to an [employer] wrap. I feel like he did better in the three layer compression. With that being said the patient currently shows no signs of worsening the mepitel did help prevent any sticking to the wound bed which is great news. 06/12/18 an evaluation today patient presents for follow-up concerning his right lower extremity ulceration. The medial aspect ulcer appears to be doing fairly well this time which is good news. He has a blister on the lateral aspect although I'm concerned this may actually be due to the fact that his wrap is sliding down. We seem to be having issues with getting this wrapped appropriately were it will stay in place. Fortunately there does not appear to be any signs of infection at this time which is good news. He is  seen with his son present at this point during the visit. 06/16/18 on evaluation today patient appears to be doing rather well and in fact appears to be almost completely healed in regard to his lower extremity ulcer on the right. The wrap much better this week with [place] wrapping it and just keeping him in that same wrap until follow-up. For that reason we will discontinue home health services.  Fortunately there does not appear to be the evidence of infection at this point. 06/23/18 on evaluation today patient appears to be doing very well in regard to his ulcer which in fact appears to be completely healed. He has been tolerating the wraps without complication although he is definitely ready to be out of these considering how well things have done. Fortunately there is not appear to be any signs of infection at this time which is great news. 08/26/2019 upon evaluation today patient appears to be doing somewhat poorly upon initial inspection here in our clinic for his current issue. This is a patient that I previously taken care of with regard to wounds on his legs. With that being said the issues that were having right now are actually separate from that he has a heel ulcer unfortunately and then subsequently also does have a wound in the right gluteal region that appears to be more of a skin tear. The patient does have a history of diabetes mellitus type 2, peripheral vascular disease, hypertension, and chronic kidney disease stage IV. He is also somewhat weak though he does like to walk around which is actually a good thing he does so slowly. He is currently at an assisted living facility at this point. 09/02/2019 upon evaluation today patient appears to be doing well with regard to the wound on his left heel as well as the right gluteal region. He has had the arterial studies I did look up the numbers and it appears that his TBI's were actually pretty good bilaterally measuring about 1 on the left and around 0.64 on the right. With that being said the final report is not here yet we should have that by next week. Nonetheless looking at the results of the imaging I feel like he has good arterial flow to be able to proceed with debridement today especially on this left heel. 09/09/2019 upon evaluation today patient's wounds actually appear to be showing signs of improvement. In the  gluteal/sacral region he is showing signs of significant improvement and in fact I really do not see much that is exactly open although there is a lot of moisture type breakdown that I do see at this point. Nonetheless I believe that the patient can potentially transition from a dressing to just using a barrier cream in this area. In regard to the heel that it does appear to be showing signs of improvement I think the Iodoflex is doing a good job. 09/16/2019 upon evaluation today patient appears to be doing well with regard to his heel ulcer. He has been tolerating the dressing changes without complication. Fortunately there is no signs of active infection at this point which is great news. There is some question about physical therapy whether he should be cutting back on this or not. With that being said it does not appear its causing any damage to his heel I think he can actually continue with therapy at this point. 09/23/2019 upon evaluation today patient appears to be doing excellent in regard to his heel ulcer. He has been tolerating the  dressing changes without complication. Fortunately there is no signs of active infection at this time. No fevers, chills, nausea, vomiting, or diarrhea. 4/22; right heel ulcer at the tip of his heel. Raised edges nonviable surface. We have been using Iodoflex dimensions of the wound improving The patient had a fall this week. He had a skin tear on his right upper lateral humeral area. We are asked to look at this today 10/07/2019 upon evaluation today patient appears to be doing better with regard to his heel I think we may be ready to switch to a collagen-based dressing here. In regard to the skin tear unfortunately the think this is good to have to have some of the skin removed it does not seem to be healing as appropriately as we would like. Fortunately there is no signs of active infection at this time. [name], [name] ([id number]) 10/14/2019 patient appears to be  doing well in regard to both his left heel ulcer as well as his right upper arm ulcer. He has been tolerating the dressing changes without complication. Fortunately there is no signs of active infection at this time. Overall I think the collagen has done well although it sticking somewhat to the arm at this point which I think may be slowing things down to some degree. Fortunately there is no signs of worsening at either location as it is anyway. 5/13; the patient had a wound on the right upper humerus this is fully closed. He has an area on the tip of his left heel. Still open may be slightly smaller but not a lot. Still some depth. We are using silver collagen to this area 5/20 tip of his left heel. About the same as last time no real improvement. We have been using silver collagen. He has home health coming into the facility where he lives Objective Constitutional Patient is hypertensive.. Pulse regular and within target range for patient.Marland Kitchen Respirations regular, non-labored and within target range.. Temperature is normal and within the target range for the patient.Marland Kitchen appears in no distress. Vitals Time Taken: 1:55 PM, Height: 68 in, Weight: 145 lbs, BMI: 22, Temperature: 97.6 F, Pulse: 79 bpm, Respiratory Rate: 16 breaths/min, Blood Pressure: 161/61 mmHg. General Notes: Wound exam; the only remaining wound is on the tip of the left heel. Not much change from last time. I removed subcutaneous debris from the surface of the wound as well as skin and subcutaneous tissue from the circumference of the wound. Hemostasis with a pressure dressing Integumentary (Hair, Skin) Wound #2 status is Open. Original cause of wound was Gradually Appeared. The wound is located on the Left Calcaneus. The wound measures 0.5cm length x 0.9cm width x 0.3cm depth; 0.353cm^2 area and 0.106cm^3 volume. There is Fat Layer (Subcutaneous Tissue) Exposed exposed. There is no tunneling or undermining noted. There is a  medium amount of serous drainage noted. The wound margin is flat and intact. There is large (67-100%) pink granulation within the wound bed. There is a small (1-33%) amount of necrotic tissue within the wound bed including Adherent Slough. Assessment Active Problems ICD-10 Type 2 diabetes mellitus with foot ulcer Abrasion of right upper arm, subsequent encounter Pressure ulcer of left heel, unstageable Other specified peripheral vascular diseases Essential (primary) hypertension Chronic kidney disease, stage 4 (severe) Procedures Wound #2 Pre-procedure diagnosis of Wound #2 is a Pressure Ulcer located on the Left Calcaneus . There was a Excisional Skin/Subcutaneous Tissue Debridement with a total area of 0.45 sq cm performed by Linton Ham  G, MD. With the following instrument(s): Curette to remove Viable and Non-Viable tissue/material. Material removed includes Subcutaneous Tissue and Slough and after achieving pain control using Lidocaine. A time out was conducted at 14:56, prior to the start of the procedure. A Minimum amount of bleeding was controlled with Silver Nitrate. The procedure was tolerated well. Post Debridement Measurements: 0.5cm length x 0.9cm width x 0.3cm depth; 0.106cm^3 volume. Post debridement Stage noted as Category/Stage III. Character of Wound/Ulcer Post Debridement is stable. Post procedure Diagnosis Wound #2: Same as Pre-Procedure [name], [name] R. ([id number]) Plan Wound Cleansing: Wound #2 Left Calcaneus: May Shower, gently pat wound dry prior to applying new dressing. Anesthetic (add to Medication List): Wound #2 Left Calcaneus: Topical Lidocaine 4% cream applied to wound bed prior to debridement (In Clinic Only). - in clinic only Primary Wound Dressing: Wound #2 Left Calcaneus: Silver Collagen - moisten with normal saline Secondary Dressing: Wound #2 Left Calcaneus: Boardered Foam Dressing Dressing Change Frequency: Wound #2 Left Calcaneus: Change  Dressing Monday, Wednesday, Friday Follow-up Appointments: Wound #2 Left Calcaneus: Return Appointment in 2 weeks. Edema Control: Wound #2 Left Calcaneus: Elevate legs to the level of the heart and pump ankles as often as possible Off-Loading: Wound #2 Left Calcaneus: Turn and reposition every 2 hours Home Health: Wound #2 Left Calcaneus: Continue Home Health Visits - [facility] Nurse may visit PRN to address patient s wound care needs. FACE TO FACE ENCOUNTER: MEDICARE and MEDICAID PATIENTS: I certify that this patient is under my care and that I had a face-to-face encounter that meets the physician face-to-face encounter requirements with this patient on this date. The encounter with the patient was in whole or in part for the following MEDICAL CONDITION: (primary reason for [facility]) MEDICAL NECESSITY: I certify, that based on my findings, NURSING services are a medically necessary home health service. HOME BOUND STATUS: I certify that my clinical findings support that this patient is homebound (i.e., Due to illness or injury, pt requires aid of supportive devices such as crutches, cane, wheelchairs, walkers, the use of special transportation or the assistance of another person to leave their place of residence. There is a normal inability to leave the home and doing so requires considerable and taxing effort. Other absences are for medical reasons / religious services and are infrequent or of short duration when for other reasons). If current dressing causes regression in wound condition, may D/C ordered dressing product/s and apply Normal Saline Moist Dressing daily until next [place] / Other MD appointment. Hoffman of regression in wound condition at [phone]. Please direct any NON-WOUND related issues/requests for orders to patient's Primary Care Physician 1. I am continuing with silver collagen 2. He has home health. This limits  what we can choose as the primary dressing. We did a reasonably aggressive debridement today next time we see him we will have to decide whether to continue the silver collagen. Iodoflex came to mind if the surface debridement still is Electronic Signature(s) Signed: 10/28/2019 5:46:06 PM By: Linton Ham MD Entered By: Linton Ham on 10/28/2019 15:28:59 KYSTON, GONCE ([id number]) -------------------------------------------------------------------------------- SuperBill Details Patient Name: [name] Date of Service: 10/28/2019 Medical Record Number: [id number] Patient Account Number: [id number] Date of Birth/Sex: 1931-12-26 (86 y.o. M) Treating RN: Army Melia Primary Care Provider: [name] Other Clinician: Referring Provider: [name] Treating Provider/Extender: Tito Dine in Treatment: 9 Diagnosis Coding ICD-10 Codes Code Description E11.621 Type 2 diabetes mellitus with  foot ulcer S40.811D Abrasion of right upper arm, subsequent encounter L89.620 Pressure ulcer of left heel, unstageable I73.89 Other specified peripheral vascular diseases I10 Essential (primary) hypertension N18.4 Chronic kidney disease, stage 4 (severe) Facility Procedures CPT4 Code: 29937169 Description: 67893 - DEB SUBQ TISSUE 20 SQ CM/< Modifier: Quantity: 1 CPT4 Code: Description: ICD-10 Diagnosis Description L89.620 Pressure ulcer of left heel, unstageable Modifier: Quantity: Physician Procedures CPT4 Code: 8101751 Description: 02585 - WC PHYS SUBQ TISS 20 SQ CM Modifier: Quantity: 1 CPT4 Code: Description: ICD-10 Diagnosis Description L89.620 Pressure ulcer of left heel, unstageable Modifier: Quantity: Electronic Signature(s) Signed: 10/28/2019 5:46:06 PM By: Linton Ham MD Entered By: Linton Ham on 10/28/2019 15:29:18

## 2019-11-11 ENCOUNTER — Ambulatory Visit: Payer: Medicare Other | Admitting: Physician Assistant

## 2019-11-12 ENCOUNTER — Other Ambulatory Visit: Payer: Self-pay

## 2019-11-12 ENCOUNTER — Encounter: Payer: Self-pay | Admitting: Nurse Practitioner

## 2019-11-12 ENCOUNTER — Non-Acute Institutional Stay: Payer: Medicare Other | Admitting: Nurse Practitioner

## 2019-11-12 DIAGNOSIS — F0391 Unspecified dementia with behavioral disturbance: Secondary | ICD-10-CM

## 2019-11-12 DIAGNOSIS — Z515 Encounter for palliative care: Secondary | ICD-10-CM

## 2019-11-12 NOTE — Progress Notes (Signed)
[facility] Consult Note Telephone: [phone]  Fax: [phone]  PATIENT NAME: MIRANDA GARBER DOB: [birth year] MRN: [id number]  PRIMARY CARE PROVIDER:   [name], MD  REFERRING PROVIDER:  [name], MD [address] [facility] [place],  [place] [address]   RESPONSIBLE PARTY:Daughter in [place] This visitand family meetingwas done face to face in person  RECOMMENDATIONS and PLAN: 1.ACP:DNR, in Epic/Vynca; will continue PTwith ongoing improvements, wishes are no dialysis but to treat what is treatable,  2.Palliative care encounter; Palliative medicine team will continue to support patient, patient's family, and medical team. Visit consisted of counseling and education dealing with the complex and emotionally intense issues of symptom management and palliative care in the setting of serious and potentially life-threatening illness  I spent 90 minutes providing this consultation,  from 12:00pm to 1:30pm. More than 50% of the time in this consultation was spent coordinating communication.   HISTORY OF PRESENT ILLNESS:  JYDEN KROMER is a 86 y.o. year old male with multiple medical problems including Dementia, Cancer, iron deficiency anemia , peripheral artery disease, chronic kidney disease, diabetes, heart murmur, hypertension, hyperlipidemia, retinal detachment, polypectomy, right intramedullary nail, hernia repair, eye surgery. Face-to-face visit follow up palliative care or mr. Neglia. Mr. Applegate does continue to reside at [facility]. Mr. Lowdermilk does walk with his walker, requires assistance with ADLs, toileting. Mr. Wickens does go to the dining area for meals. Mr. Devan does feed himself and appetite has been slowly improving. Staff endorses Mr. Coronado seems to be doing much better since family has been able to visit more frequently with restrictions lifted at the facility. No recent falls, infections,  hospitalizations. At present Mr. Tuohey is standing at the sink in the bathroom in his room with his walker. Mr. Winkles was washing his hands. We talked about purpose of palliative care visit. Mr Brue made eye contact and said thank you. We talked about how his day was. Mr. Cuadra endorses his day was good and he was feeling well. We talked about symptoms of pain and shortness of breath which he denies. We talked about his appetite. Mr Mantz endorses that he does like peanuts. We talked about upcoming lunch meal. Mr. Durbin endorsed is that he needs to walk down to the dining area that it was getting close to time to eat. We talked about foods that he liked. We talked about ambulating with a walker. We talked about the work he is doing with therapy. We talked about the wound care center and though it was limited with cognitive impairment. Mr Cooks was walking with his walker with steady gait. Mr Corning was cooperative with [place] estimate. Emotional support provided. We talked about role palliative care. Discuss that will be meeting with [facility] separately. Mr. Serpe in agreement. I met with [name], Mr. Slyter daughter-in-law. Clinical update discussed. We talked about how Mr Mejorado has been feeling. [name] endorses that he's still slowly overall declining with some issues with swelling. Wound on his foot is not healing though the sacral wound has improved. We talked about his work with therapy. We talked about Mr. Mehringer ambulating with a walker. We talked about his appetite. [name] endorses that you have to be cautious on what you put in his room as he will eat all of the food at one time. Mr. Bhakta endorses that she has come up with a plan on only putting smaller packages of food  in his room daily rather than large packages where he consumed them all. Mr. Dejaynes did eat a large container of peanuts over a few days resulting in more swelling in his lower legs. We talked about upcoming appointment with nephrologist. We talked about  worsening chronic kidney disease. We talked about his memory in the setting of dementia natural aging. We talked about quality-of-life. We talked about the things that he enjoys including the donkeys on the property. We talked about the tomato plants that they planted. Discuss with [name] that Mr. Sibal did talk about the donkey and tomato plants said they were getting big. We talked about medical goals up care. We talked about family dynamics at length. We talked about transitioning with permanent residency at [facility], what to do with his home. We talked through different scenarios. We talked about Mr. Meester life when he was younger, married and the life he shared with Mrs. Arts who is deceased including her mental illness. [name] talked at length about different events that happened throughout their life. We talked about role of palliative care and plan of care. We talked about coping strategies. Therapeutic listening and emotional support provided. Contact information. We talked about follow up palliative care visit and 6 weeks if needed or sooner should he declined. [name] in agreement. I updated nursing staff in any changes to current goals are plan of care.  11/05/2019 weight 173.0 lbs  Palliative Care was asked to help address goals of care.   CODE STATUS: DNR  PPS: 50% HOSPICE ELIGIBILITY/DIAGNOSIS: TBD  PAST MEDICAL HISTORY:  Past Medical History:  Diagnosis Date  . Cancer ([place])   . Chronic kidney disease   . Colon polyp   . Diabetes mellitus without complication ([place])   . Heart murmur 2008  . Hyperlipidemia   . Hypertension 1980  . Retinal detachment     SOCIAL HX:  Social History   Tobacco Use  . Smoking status: Never Smoker  . Smokeless tobacco: Never Used  Substance Use Topics  . Alcohol use: No    ALLERGIES:  Allergies  Allergen Reactions  . Meperidine Other (See Comments)    Severe bradycardia Severe bradycardia   . Other     Darvocet-N Lowers heart  rate  . Penicillins Rash     PERTINENT MEDICATIONS:  Outpatient Encounter Medications as of 11/12/2019  Medication Sig  . acetaminophen (TYLENOL) 500 MG tablet Take 500 mg by mouth every 12 (twelve) hours as needed for mild pain.  Marland Kitchen amLODipine (NORVASC) 10 MG tablet Take 10 mg by mouth daily.   Marland Kitchen aspirin EC 81 MG EC tablet Take 1 tablet (81 mg total) by mouth daily.  Marland Kitchen B-Complex TABS Take 1 tablet by mouth daily.  . calcitRIOL (ROCALTROL) 0.25 MCG capsule Take 0.25 mcg by mouth daily.   . calcium carbonate (OSCAL) 1500 (600 Ca) MG TABS tablet Take 600 mg of elemental calcium by mouth 2 (two) times daily.  . Cholecalciferol (VITAMIN D) 50 MCG (2000 UT) tablet Take 2,000 Units by mouth daily.  Marland Kitchen denosumab (PROLIA) 60 MG/ML SOSY injection Inject 60 mg into the skin every 6 (six) months.  . ferrous sulfate 325 (65 FE) MG tablet Take 325 mg by mouth daily with breakfast.  . furosemide (LASIX) 20 MG tablet Take 10-40 mg by mouth 2 (two) times daily. Take 2 tablets ('40mg'$ ) by mouth every morning and take  tablet ('10mg'$ ) by mouth at lunchtime  . JALYN 0.5-0.4 MG CAPS Take 1 capsule by  mouth daily.   Marland Kitchen linagliptin (TRADJENTA) 5 MG TABS tablet Take 5 mg by mouth daily.  Marland Kitchen losartan (COZAAR) 25 MG tablet Take 0.5 tablets (12.5 mg total) by mouth 2 (two) times daily.  . Multiple Vitamin (MULTIVITAMIN) capsule Take 1 capsule by mouth daily.  . Nutritional Supplements (FEEDING SUPPLEMENT, NEPRO CARB STEADY,) LIQD Take 237 mLs by mouth 3 (three) times daily with meals.  . polyethylene glycol (MIRALAX / GLYCOLAX) 17 g packet Take 17 g by mouth daily.  . potassium chloride (KLOR-CON) 10 MEQ tablet Take 10 mEq by mouth daily.  Marland Kitchen saccharomyces boulardii (FLORASTOR) 250 MG capsule Take 250 mg by mouth 2 (two) times daily.  . sodium bicarbonate 650 MG tablet Take 650 mg by mouth 2 (two) times daily.  Marland Kitchen triamcinolone ointment (KENALOG) 0.1 % Apply 1 application topically 2 (two) times daily. (apply to arms and legs)     No facility-administered encounter medications on file as of 11/12/2019.    PHYSICAL EXAM:   General: NAD, frail appearing, thin, pleasant male Cardiovascular: regular rate and rhythm Pulmonary: clear ant fields Neurological: Walks with walker  Alaja Goldinger Ihor Gully, NP

## 2019-11-15 ENCOUNTER — Other Ambulatory Visit: Payer: Self-pay

## 2019-11-15 ENCOUNTER — Encounter: Payer: Medicare Other | Attending: Physician Assistant | Admitting: Physician Assistant

## 2019-11-15 DIAGNOSIS — N186 End stage renal disease: Secondary | ICD-10-CM | POA: Insufficient documentation

## 2019-11-15 DIAGNOSIS — E1122 Type 2 diabetes mellitus with diabetic chronic kidney disease: Secondary | ICD-10-CM | POA: Insufficient documentation

## 2019-11-15 DIAGNOSIS — L89620 Pressure ulcer of left heel, unstageable: Secondary | ICD-10-CM | POA: Insufficient documentation

## 2019-11-15 DIAGNOSIS — I120 Hypertensive chronic kidney disease with stage 5 chronic kidney disease or end stage renal disease: Secondary | ICD-10-CM | POA: Insufficient documentation

## 2019-11-15 DIAGNOSIS — E1151 Type 2 diabetes mellitus with diabetic peripheral angiopathy without gangrene: Secondary | ICD-10-CM | POA: Diagnosis not present

## 2019-11-15 DIAGNOSIS — E11621 Type 2 diabetes mellitus with foot ulcer: Secondary | ICD-10-CM | POA: Insufficient documentation

## 2019-11-15 NOTE — Progress Notes (Addendum)
[name], [name] ([id number]) Visit Report for 11/15/2019 Chief Complaint Document Details Patient Name: [name], [name] Date of Service: 11/15/2019 9:15 AM Medical Record Number: [id number] Patient Account Number: [id number] Date of Birth/Sex: 1932-04-30 (86 y.o. M) Treating RN: Army Melia Primary Care Provider: [name] Other Clinician: Referring Provider: [name] Treating Provider/Extender: Melburn Hake, Temari Schooler Weeks in Treatment: 11 Information Obtained from: Patient Chief Complaint Left heel ulcer Electronic Signature(s) Signed: 11/15/2019 9:23:29 AM By: Worthy Keeler PA-C Entered By: Worthy Keeler on 11/15/2019 09:23:29 MILLION, MAHARAJ ([id number]) -------------------------------------------------------------------------------- Debridement Details Patient Name: [name] Date of Service: 11/15/2019 9:15 AM Medical Record Number: [id number] Patient Account Number: [id number] Date of Birth/Sex: 1931-10-03 (86 y.o. M) Treating RN: Army Melia Primary Care Provider: [name] Other Clinician: Referring Provider: [name] Treating Provider/Extender: Melburn Hake, Julio Zappia Weeks in Treatment: 11 Debridement Performed for Wound #2 Left Calcaneus Assessment: Performed By: Physician STONE III, Julus Kelley E., PA-C Debridement Type: Debridement Level of Consciousness (Pre- Awake and Alert procedure): Pre-procedure Verification/Time Out Yes - 09:50 Taken: Start Time: 09:50 Pain Control: Lidocaine Total Area Debrided (L x W): 0.3 (cm) x 0.4 (cm) = 0.12 (cm) Tissue and other material Viable, Non-Viable, Callus, Slough, Subcutaneous, Slough debrided: Level: Skin/Subcutaneous Tissue Debridement Description: Excisional Instrument: Curette Bleeding: None End Time: 09:52 Response to Treatment: Procedure was tolerated well Level of Consciousness (Post- Awake and Alert procedure): Post Debridement Measurements of Total Wound Length: (cm) 0.3 Stage: Category/Stage III Width:  (cm) 0.4 Depth: (cm) 0.3 Volume: (cm) 0.028 Character of Wound/Ulcer Post Debridement: Stable Post Procedure Diagnosis Same as Pre-procedure Electronic Signature(s) Signed: 11/15/2019 12:56:57 PM By: Army Melia Signed: 11/15/2019 4:20:46 PM By: Worthy Keeler PA-C Entered By: Army Melia on 11/15/2019 09:51:27 Messner, [name] ([id number]) -------------------------------------------------------------------------------- HPI Details Patient Name: [name] Date of Service: 11/15/2019 9:15 AM Medical Record Number: [id number] Patient Account Number: [id number] Date of Birth/Sex: Jan 22, 1932 (86 y.o. M) Treating RN: Army Melia Primary Care Provider: [name] Other Clinician: Referring Provider: [name] Treating Provider/Extender: Melburn Hake, Stafford Riviera Weeks in Treatment: 11 History of Present Illness HPI Description: 05/19/18 on evaluation today patient presents for initial evaluation and clinic concerning issues that he has been having with his right medial lower leg ulcer. The patient has actually been seen in the emergency department for cellulitis he was admitted at [facility] on 04/19/18 discharged 04/21/18. He was noted to have proof of vascular disease and did undergo an angiogram on 04/20/18 by Dr. Leotis Pain. 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 Subsequently Dr. dew following intervention noted that the patient had depending on the location 20-40% residual stenosis noted and significant improvement in the overall vascular flow. Obviously this is great news. With that being said the patient's wound he states does seem to be doing some better compared to previous. Upon inspection indeed it appears that he has some epithelialization. Overall I do not see any evidence of significant infection which is great news. No fevers chills noted 05/26/18 on evaluation today  patient's wound actually appears to be doing much better at this time. With that being said I do think the [facility] Dressing a be sticking according to my nurse and this may be causing some issues as well. I'm gonna suggest at this point that we likely add mepitel underneath the [facility] Dressing help prevent this. Other than that everything  seems to be doing excellent. 06/02/18 on evaluation today patient actually appears to be doing decently well in regard to his right lower extremity ulcer. This seems to show signs of good improvement which is excellent news. With that being said he did see vascular the good news is no further intervention is recommended nor necessary at this point. Actually placed in an [employer] and sent orders to home health which in the end it led to her switching the patient from the three layer compression wrap of the right lower extremity to an [employer] wrap. I feel like he did better in the three layer compression. With that being said the patient currently shows no signs of worsening the mepitel did help prevent any sticking to the wound bed which is great news. 06/12/18 an evaluation today patient presents for follow-up concerning his right lower extremity ulceration. The medial aspect ulcer appears to be doing fairly well this time which is good news. He has a blister on the lateral aspect although I'm concerned this may actually be due to the fact that his wrap is sliding down. We seem to be having issues with getting this wrapped appropriately were it will stay in place. Fortunately there does not appear to be any signs of infection at this time which is good news. He is seen with his son present at this point during the visit. 06/16/18 on evaluation today patient appears to be doing rather well and in fact appears to be almost completely healed in regard to his lower extremity ulcer on the right. The wrap much better this week with [place] wrapping it and just  keeping him in that same wrap until follow-up. For that reason we will discontinue home health services. Fortunately there does not appear to be the evidence of infection at this point. 06/23/18 on evaluation today patient appears to be doing very well in regard to his ulcer which in fact appears to be completely healed. He has been tolerating the wraps without complication although he is definitely ready to be out of these considering how well things have done. Fortunately there is not appear to be any signs of infection at this time which is great news. 08/26/2019 upon evaluation today patient appears to be doing somewhat poorly upon initial inspection here in our clinic for his current issue. This is a patient that I previously taken care of with regard to wounds on his legs. With that being said the issues that were having right now are actually separate from that he has a heel ulcer unfortunately and then subsequently also does have a wound in the right gluteal region that appears to be more of a skin tear. The patient does have a history of diabetes mellitus type 2, peripheral vascular disease, hypertension, and chronic kidney disease stage IV. He is also somewhat weak though he does like to walk around which is actually a good thing he does so slowly. He is currently at an assisted living facility at this point. 09/02/2019 upon evaluation today patient appears to be doing well with regard to the wound on his left heel as well as the right gluteal region. He has had the arterial studies I did look up the numbers and it appears that his TBI's were actually pretty good bilaterally measuring about 1 on the left and around 0.64 on the right. With that being said the final report is not here yet we should have that by next week. Nonetheless looking at the results  of the imaging I feel like he has good arterial flow to be able to proceed with debridement today especially on this left heel. 09/09/2019 upon  evaluation today patient's wounds actually appear to be showing signs of improvement. In the gluteal/sacral region he is showing signs of significant improvement and in fact I really do not see much that is exactly open although there is a lot of moisture type breakdown that I do see at this point. Nonetheless I believe that the patient can potentially transition from a dressing to just using a barrier cream in this area. In regard to the heel that it does appear to be showing signs of improvement I think the Iodoflex is doing a good job. 09/16/2019 upon evaluation today patient appears to be doing well with regard to his heel ulcer. He has been tolerating the dressing changes without complication. Fortunately there is no signs of active infection at this point which is great news. There is some question about physical therapy whether he should be cutting back on this or not. With that being said it does not appear its causing any damage to his heel I think he can actually continue with therapy at this point. 09/23/2019 upon evaluation today patient appears to be doing excellent in regard to his heel ulcer. He has been tolerating the dressing changes without complication. Fortunately there is no signs of active infection at this time. No fevers, chills, nausea, vomiting, or diarrhea. 4/22; right heel ulcer at the tip of his heel. Raised edges nonviable surface. We have been using Iodoflex dimensions of the wound improving oThe patient had a fall this week. He had a skin tear on his right upper lateral humeral area. We are asked to look at this today 10/07/2019 upon evaluation today patient appears to be doing better with regard to his heel I think we may be ready to switch to a collagen-based dressing here. In regard to the skin tear unfortunately the think this is good to have to have some of the skin removed it does not seem to be healing as appropriately as we would like. Fortunately there is no signs  of active infection at this time. FRANKEY, BOTTING ([id number]) 10/14/2019 patient appears to be doing well in regard to both his left heel ulcer as well as his right upper arm ulcer. He has been tolerating the dressing changes without complication. Fortunately there is no signs of active infection at this time. Overall I think the collagen has done well although it sticking somewhat to the arm at this point which I think may be slowing things down to some degree. Fortunately there is no signs of worsening at either location as it is anyway. 5/13; the patient had a wound on the right upper humerus this is fully closed. He has an area on the tip of his left heel. Still open may be slightly smaller but not a lot. Still some depth. We are using silver collagen to this area 5/20 tip of his left heel. About the same as last time no real improvement. We have been using silver collagen. He has home health coming into the facility where he lives 11/15/2019 upon evaluation today patient appears to be doing excellent in regard to his wounds currently. He has been tolerating the dressing changes without complication. Fortunately there is no sign of active infection at this time. No fevers, chills, nausea, vomiting, or diarrhea. Electronic Signature(s) Signed: 11/15/2019 9:55:56 AM By: Worthy Keeler PA-C Entered By:  Worthy Keeler on 11/15/2019 09:55:55 DESMON, HITCHNER ([id number]) -------------------------------------------------------------------------------- Physical Exam Details Patient Name: CALTON, HARSHFIELD Date of Service: 11/15/2019 9:15 AM Medical Record Number: [id number] Patient Account Number: [id number] Date of Birth/Sex: 06-Aug-1931 (86 y.o. M) Treating RN: Army Melia Primary Care Provider: [name] Other Clinician: Referring Provider: [name] Treating Provider/Extender: Melburn Hake, Aasiyah Auerbach Weeks in Treatment: 103 Constitutional Well-nourished and well-hydrated in no acute  distress. Respiratory normal breathing without difficulty. Psychiatric this patient is able to make decisions and demonstrates good insight into disease process. Alert and Oriented x 3. pleasant and cooperative. Notes Patient's wound did require sharp debridement to clear away some of the necrotic debris from the surface of the wound as well as slough and biofilm as well. I was able to perform debridement today without complication post debridement wound bed appears to be doing much better which is great news. Overall I am very pleased with where things stand today. Electronic Signature(s) Signed: 11/15/2019 9:56:23 AM By: Worthy Keeler PA-C Entered By: Worthy Keeler on 11/15/2019 09:56:22 TARANCE, BALAN ([id number]) -------------------------------------------------------------------------------- Physician Orders Details Patient Name: [name] Date of Service: 11/15/2019 9:15 AM Medical Record Number: [id number] Patient Account Number: [id number] Date of Birth/Sex: 09-Oct-1931 (86 y.o. M) Treating RN: Army Melia Primary Care Provider: [name] Other Clinician: Referring Provider: [name] Treating Provider/Extender: Melburn Hake, Wyman Meschke Weeks in Treatment: 57 Verbal / Phone Orders: No Diagnosis Coding ICD-10 Coding Code Description E11.621 Type 2 diabetes mellitus with foot ulcer S40.811D Abrasion of right upper arm, subsequent encounter L89.620 Pressure ulcer of left heel, unstageable I73.89 Other specified peripheral vascular diseases I10 Essential (primary) hypertension N18.4 Chronic kidney disease, stage 4 (severe) Wound Cleansing Wound #2 Left Calcaneus o May Shower, gently pat wound dry prior to applying new dressing. Anesthetic (add to Medication List) Wound #2 Left Calcaneus o Topical Lidocaine 4% cream applied to wound bed prior to debridement (In Clinic Only). - in clinic only Primary Wound Dressing Wound #2 Left Calcaneus o Silver Collagen -  moisten with normal saline Secondary Dressing Wound #2 Left Calcaneus o Boardered Foam Dressing Dressing Change Frequency Wound #2 Left Calcaneus o Three times weekly Follow-up Appointments Wound #2 Left Calcaneus o Return Appointment in 2 weeks. Edema Control Wound #2 Left Calcaneus o Elevate legs to the level of the heart and pump ankles as often as possible Off-Loading Wound #2 Left Calcaneus o Turn and reposition every 2 hours Home Health Wound #2 Left [place] [facility] o Home Health Nurse may visit PRN to address patientos wound care needs. o FACE TO FACE ENCOUNTER: MEDICARE and MEDICAID PATIENTS: I certify that this patient is under my care and that I had a face-to-face encounter that meets the physician face-to-face encounter requirements with this patient on this date. The encounter with the patient was in whole or in part for the following MEDICAL CONDITION: (primary reason for Home Pippen, Reda R. ([id number]) Healthcare) MEDICAL NECESSITY: I certify, that based on my findings, NURSING services are a medically necessary home health service. HOME BOUND STATUS: I certify that my clinical findings support that this patient is homebound (i.e., Due to illness or injury, pt requires aid of supportive devices such as crutches, cane, wheelchairs, walkers, the use of special transportation or the assistance of another person to leave their place of residence. There is a normal inability to leave the home and doing so requires considerable and taxing effort. Other absences are for medical reasons /  religious services and are infrequent or of short duration when for other reasons). o If current dressing causes regression in wound condition, may D/C ordered dressing product/s and apply Normal Saline Moist Dressing daily until next [place] / Other MD appointment. [place] of regression in wound condition  at [phone]. o Please direct any NON-WOUND related issues/requests for orders to patient's Primary Care Physician Electronic Signature(s) Signed: 11/15/2019 12:56:57 PM By: Army Melia Signed: 11/15/2019 4:20:46 PM By: Worthy Keeler PA-C Entered By: Army Melia on 11/15/2019 09:52:22 TRESTIN, VENCES ([id number]) -------------------------------------------------------------------------------- Problem List Details Patient Name: [name] Date of Service: 11/15/2019 9:15 AM Medical Record Number: [id number] Patient Account Number: [id number] Date of Birth/Sex: [birth year] (86 y.o. M) Treating RN: Army Melia Primary Care Provider: [name] Other Clinician: Referring Provider: [name] Treating Provider/Extender: Melburn Hake, Corday Wyka Weeks in Treatment: 11 Active Problems ICD-10 Encounter Code Description Active Date MDM Diagnosis E11.621 Type 2 diabetes mellitus with foot ulcer 08/26/2019 No Yes S40.811D Abrasion of right upper arm, subsequent encounter 09/30/2019 No Yes L89.620 Pressure ulcer of left heel, unstageable 08/26/2019 No Yes I73.89 Other specified peripheral vascular diseases 08/26/2019 No Yes I10 Essential (primary) hypertension 08/26/2019 No Yes N18.4 Chronic kidney disease, stage 4 (severe) 08/26/2019 No Yes Inactive Problems ICD-10 Code Description Active Date Inactive Date S31.819A Unspecified open wound of right buttock, initial encounter 08/26/2019 08/26/2019 Resolved Problems Electronic Signature(s) Signed: 11/15/2019 9:23:14 AM By: Worthy Keeler PA-C Entered By: Worthy Keeler on 11/15/2019 09:23:13 Heidrick, [name] ([id number]) -------------------------------------------------------------------------------- Progress Note Details Patient Name: [name] Date of Service: 11/15/2019 9:15 AM Medical Record Number: [id number] Patient Account Number: [id number] Date of Birth/Sex: [birth year] (86 y.o. M) Treating RN: Army Melia Primary Care Provider:  [name] Other Clinician: Referring Provider: [name] Treating Provider/Extender: Melburn Hake, Charly Holcomb Weeks in Treatment: 11 Subjective Chief Complaint Information obtained from Patient Left heel ulcer History of Present Illness (HPI) 05/19/18 on evaluation today patient presents for initial evaluation and clinic concerning issues that he has been having with his right medial lower leg ulcer. The patient has actually been seen in the emergency department for cellulitis he was admitted at [facility] on 04/19/18 discharged 04/21/18. He was noted to have proof of vascular disease and did undergo an angiogram on 04/20/18 by Dr. Leotis Pain. 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 Subsequently Dr. dew following intervention noted that the patient had depending on the location 20-40% residual stenosis noted and significant improvement in the overall vascular flow. Obviously this is great news. With that being said the patient's wound he states does seem to be doing some better compared to previous. Upon inspection indeed it appears that he has some epithelialization. Overall I do not see any evidence of significant infection which is great news. No fevers chills noted 05/26/18 on evaluation today patient's wound actually appears to be doing much better at this time. With that being said I do think the [facility] Dressing a be sticking according to my nurse and this may be causing some issues as well. I'm gonna suggest at this point that we likely add mepitel underneath the [facility] Dressing help prevent this. Other than that everything seems to be doing excellent. 06/02/18 on evaluation today patient actually appears to be doing decently well in regard to his right lower extremity ulcer. This seems to  show signs of good improvement which is excellent news. With that being  said he did see vascular the good news is no further intervention is recommended nor necessary at this point. Actually placed in an [employer] and sent orders to home health which in the end it led to her switching the patient from the three layer compression wrap of the right lower extremity to an [employer] wrap. I feel like he did better in the three layer compression. With that being said the patient currently shows no signs of worsening the mepitel did help prevent any sticking to the wound bed which is great news. 06/12/18 an evaluation today patient presents for follow-up concerning his right lower extremity ulceration. The medial aspect ulcer appears to be doing fairly well this time which is good news. He has a blister on the lateral aspect although I'm concerned this may actually be due to the fact that his wrap is sliding down. We seem to be having issues with getting this wrapped appropriately were it will stay in place. Fortunately there does not appear to be any signs of infection at this time which is good news. He is seen with his son present at this point during the visit. 06/16/18 on evaluation today patient appears to be doing rather well and in fact appears to be almost completely healed in regard to his lower extremity ulcer on the right. The wrap much better this week with [place] wrapping it and just keeping him in that same wrap until follow-up. For that reason we will discontinue home health services. Fortunately there does not appear to be the evidence of infection at this point. 06/23/18 on evaluation today patient appears to be doing very well in regard to his ulcer which in fact appears to be completely healed. He has been tolerating the wraps without complication although he is definitely ready to be out of these considering how well things have done. Fortunately there is not appear to be any signs of infection at this time which is great news. 08/26/2019 upon evaluation today patient  appears to be doing somewhat poorly upon initial inspection here in our clinic for his current issue. This is a patient that I previously taken care of with regard to wounds on his legs. With that being said the issues that were having right now are actually separate from that he has a heel ulcer unfortunately and then subsequently also does have a wound in the right gluteal region that appears to be more of a skin tear. The patient does have a history of diabetes mellitus type 2, peripheral vascular disease, hypertension, and chronic kidney disease stage IV. He is also somewhat weak though he does like to walk around which is actually a good thing he does so slowly. He is currently at an assisted living facility at this point. 09/02/2019 upon evaluation today patient appears to be doing well with regard to the wound on his left heel as well as the right gluteal region. He has had the arterial studies I did look up the numbers and it appears that his TBI's were actually pretty good bilaterally measuring about 1 on the left and around 0.64 on the right. With that being said the final report is not here yet we should have that by next week. Nonetheless looking at the results of the imaging I feel like he has good arterial flow to be able to proceed with debridement today especially on this left heel. 09/09/2019 upon evaluation  today patient's wounds actually appear to be showing signs of improvement. In the gluteal/sacral region he is showing signs of significant improvement and in fact I really do not see much that is exactly open although there is a lot of moisture type breakdown that I do see at this point. Nonetheless I believe that the patient can potentially transition from a dressing to just using a barrier cream in this area. In regard to the heel that it does appear to be showing signs of improvement I think the Iodoflex is doing a good job. 09/16/2019 upon evaluation today patient appears to be  doing well with regard to his heel ulcer. He has been tolerating the dressing changes without complication. Fortunately there is no signs of active infection at this point which is great news. There is some question about physical therapy whether he should be cutting back on this or not. With that being said it does not appear its causing any damage to his heel I think he can actually continue with therapy at this point. 09/23/2019 upon evaluation today patient appears to be doing excellent in regard to his heel ulcer. He has been tolerating the dressing changes without complication. Fortunately there is no signs of active infection at this time. No fevers, chills, nausea, vomiting, or diarrhea. 4/22; right heel ulcer at the tip of his heel. Raised edges nonviable surface. We have been using Iodoflex dimensions of the wound improving The patient had a fall this week. He had a skin tear on his right upper lateral humeral area. We are asked to look at this today AIDON, KLEMENS. ([id number]) 10/07/2019 upon evaluation today patient appears to be doing better with regard to his heel I think we may be ready to switch to a collagen-based dressing here. In regard to the skin tear unfortunately the think this is good to have to have some of the skin removed it does not seem to be healing as appropriately as we would like. Fortunately there is no signs of active infection at this time. 10/14/2019 patient appears to be doing well in regard to both his left heel ulcer as well as his right upper arm ulcer. He has been tolerating the dressing changes without complication. Fortunately there is no signs of active infection at this time. Overall I think the collagen has done well although it sticking somewhat to the arm at this point which I think may be slowing things down to some degree. Fortunately there is no signs of worsening at either location as it is anyway. 5/13; the patient had a wound on the right upper  humerus this is fully closed. He has an area on the tip of his left heel. Still open may be slightly smaller but not a lot. Still some depth. We are using silver collagen to this area 5/20 tip of his left heel. About the same as last time no real improvement. We have been using silver collagen. He has home health coming into the facility where he lives 11/15/2019 upon evaluation today patient appears to be doing excellent in regard to his wounds currently. He has been tolerating the dressing changes without complication. Fortunately there is no sign of active infection at this time. No fevers, chills, nausea, vomiting, or diarrhea. Objective Constitutional Well-nourished and well-hydrated in no acute distress. Vitals Time Taken: 9:24 AM, Height: 68 in, Weight: 145 lbs, BMI: 22, Temperature: 97.6 F, Pulse: 66 bpm, Respiratory Rate: 16 breaths/min, Blood Pressure: 146/59 mmHg. Respiratory normal breathing without  difficulty. Psychiatric this patient is able to make decisions and demonstrates good insight into disease process. Alert and Oriented x 3. pleasant and cooperative. General Notes: Patient's wound did require sharp debridement to clear away some of the necrotic debris from the surface of the wound as well as slough and biofilm as well. I was able to perform debridement today without complication post debridement wound bed appears to be doing much better which is great news. Overall I am very pleased with where things stand today. Integumentary (Hair, Skin) Wound #2 status is Open. Original cause of wound was Gradually Appeared. The wound is located on the Left Calcaneus. The wound measures 0.3cm length x 0.4cm width x 0.3cm depth; 0.094cm^2 area and 0.028cm^3 volume. There is Fat Layer (Subcutaneous Tissue) Exposed exposed. There is no tunneling or undermining noted. There is a medium amount of serous drainage noted. The wound margin is flat and intact. There is large (67-100%) pink  granulation within the wound bed. There is a small (1-33%) amount of necrotic tissue within the wound bed including Adherent Slough. Assessment Active Problems ICD-10 Type 2 diabetes mellitus with foot ulcer Abrasion of right upper arm, subsequent encounter Pressure ulcer of left heel, unstageable Other specified peripheral vascular diseases Essential (primary) hypertension Chronic kidney disease, stage 4 (severe) Procedures Wound #2 Punt, Rykker R. ([id number]) Pre-procedure diagnosis of Wound #2 is a Pressure Ulcer located on the Left Calcaneus . There was a Excisional Skin/Subcutaneous Tissue Debridement with a total area of 0.12 sq cm performed by STONE III, Giamarie Bueche E., PA-C. With the following instrument(s): Curette to remove Viable and Non-Viable tissue/material. Material removed includes Callus, Subcutaneous Tissue, and Slough after achieving pain control using Lidocaine. A time out was conducted at 09:50, prior to the start of the procedure. There was no bleeding. The procedure was tolerated well. Post Debridement Measurements: 0.3cm length x 0.4cm width x 0.3cm depth; 0.028cm^3 volume. Post debridement Stage noted as Category/Stage III. Character of Wound/Ulcer Post Debridement is stable. Post procedure Diagnosis Wound #2: Same as Pre-Procedure Plan Wound Cleansing: Wound #2 Left Calcaneus: May Shower, gently pat wound dry prior to applying new dressing. Anesthetic (add to Medication List): Wound #2 Left Calcaneus: Topical Lidocaine 4% cream applied to wound bed prior to debridement (In Clinic Only). - in clinic only Primary Wound Dressing: Wound #2 Left Calcaneus: Silver Collagen - moisten with normal saline Secondary Dressing: Wound #2 Left Calcaneus: Boardered Foam Dressing Dressing Change Frequency: Wound #2 Left Calcaneus: Three times weekly Follow-up Appointments: Wound #2 Left Calcaneus: Return Appointment in 2 weeks. Edema Control: Wound #2 Left  Calcaneus: Elevate legs to the level of the heart and pump ankles as often as possible Off-Loading: Wound #2 Left Calcaneus: Turn and reposition every 2 hours Home Health: Wound #2 Left Calcaneus: Continue Home Health Visits - [facility] Nurse may visit PRN to address patient s wound care needs. FACE TO FACE ENCOUNTER: MEDICARE and MEDICAID PATIENTS: I certify that this patient is under my care and that I had a face-to-face encounter that meets the physician face-to-face encounter requirements with this patient on this date. The encounter with the patient was in whole or in part for the following MEDICAL CONDITION: (primary reason for [place]) MEDICAL NECESSITY: I certify, that based on my findings, NURSING services are a medically necessary home health service. HOME BOUND STATUS: I certify that my clinical findings support that this patient is homebound (i.e., Due to illness or injury, pt requires aid of supportive devices such  as crutches, cane, wheelchairs, walkers, the use of special transportation or the assistance of another person to leave their place of residence. There is a normal inability to leave the home and doing so requires considerable and taxing effort. Other absences are for medical reasons / religious services and are infrequent or of short duration when for other reasons). If current dressing causes regression in wound condition, may D/C ordered dressing product/s and apply Normal Saline Moist Dressing daily until next [place] / Other MD appointment. [place] of regression in wound condition at [phone]. Please direct any NON-WOUND related issues/requests for orders to patient's Primary Care Physician 1 I would recommend currently that we going continue with the wound care measures as before with the collagen I think that still the best option for him. 2. I am also can recommend that we go ahead and continue to monitor  for any signs of infection. I do not see anything at the moment but again we will keep her eyes on this closely. We will see patient back for reevaluation in 1 week here in the clinic. If anything worsens or changes patient will contact our office for additional recommendations. Electronic Signature(s) Signed: 11/15/2019 10:02:07 AM By: Worthy Keeler PA-C Entered By: Worthy Keeler on 11/15/2019 10:02:07 ZION, TA ([id number]) -------------------------------------------------------------------------------- SuperBill Details Patient Name: [name] Date of Service: 11/15/2019 Medical Record Number: [id number] Patient Account Number: [id number] Date of Birth/Sex: 30-Sep-1931 (86 y.o. M) Treating RN: Army Melia Primary Care Provider: [name] Other Clinician: Referring Provider: [name] Treating Provider/Extender: Melburn Hake, Gitel Beste Weeks in Treatment: 11 Diagnosis Coding ICD-10 Codes Code Description E11.621 Type 2 diabetes mellitus with foot ulcer S40.811D Abrasion of right upper arm, subsequent encounter L89.620 Pressure ulcer of left heel, unstageable I73.89 Other specified peripheral vascular diseases I10 Essential (primary) hypertension N18.4 Chronic kidney disease, stage 4 (severe) Facility Procedures CPT4 Code: [id number] Description: 85631 - DEB SUBQ TISSUE 20 SQ CM/< Modifier: Quantity: 1 CPT4 Code: Description: ICD-10 Diagnosis Description L89.620 Pressure ulcer of left heel, unstageable Modifier: Quantity: Physician Procedures CPT4 Code: [id number] Description: 78588 - WC PHYS SUBQ TISS 20 SQ CM Modifier: Quantity: 1 CPT4 Code: Description: ICD-10 Diagnosis Description L89.620 Pressure ulcer of left heel, unstageable Modifier: Quantity: Electronic Signature(s) Signed: 11/15/2019 10:02:32 AM By: Worthy Keeler PA-C Entered By: Worthy Keeler on 11/15/2019 10:02:32

## 2019-11-22 ENCOUNTER — Ambulatory Visit: Payer: Medicare Other | Admitting: Physician Assistant

## 2019-11-29 ENCOUNTER — Encounter: Payer: Medicare Other | Admitting: Physician Assistant

## 2019-11-29 ENCOUNTER — Other Ambulatory Visit: Payer: Self-pay

## 2019-11-29 DIAGNOSIS — E11621 Type 2 diabetes mellitus with foot ulcer: Secondary | ICD-10-CM | POA: Diagnosis not present

## 2019-11-29 NOTE — Progress Notes (Addendum)
ZYGMUNT, MCGLINN ([id number]) Visit Report for 11/29/2019 Chief Complaint Document Details Patient Name: [name], [name] Date of Service: 11/29/2019 12:30 PM Medical Record Number: [id number] Patient Account Number: [id number] Date of Birth/Sex: 1931/12/13 (86 y.o. M) Treating RN: Cornell Barman Primary Care Provider: [name] Other Clinician: Referring Provider: [name] Treating Provider/Extender: Melburn Hake, Imad Shostak Weeks in Treatment: 13 Information Obtained from: Patient Chief Complaint Left heel ulcer Electronic SignaturePattons) Signed: 11/29/2019 12:58:37 PM By: Worthy Keeler PA-C Entered By: Worthy Keeler on 11/29/2019 12:58:36 Hinote, [name] ([id number]) -------------------------------------------------------------------------------- Debridement Details Patient Name: [name] Date of Service: 11/29/2019 12:30 PM Medical Record Number: [id number] Patient Account Number: [id number] Date of Birth/Sex: Sep 25, 1931 (87 y.o. M) Treating RN: Cornell Barman Primary Care Provider: [name] Other Clinician: Referring Provider: [name] Treating Provider/Extender: Melburn Hake, Adilynne Fitzwater Weeks in Treatment: 13 Debridement Performed for Wound #2 Left Calcaneus Assessment: Performed By: Physician STONE III, Ahsley Attwood E., PA-C Debridement Type: Debridement Level of Consciousness (Pre- Awake and Alert procedure): Pre-procedure Verification/Time Out Yes - 13:05 Taken: Total Area Debrided (L x W): 0.2 (cm) x 0.2 (cm) = 0.04 (cm) Tissue and other material Non-Viable, Eschar, Skin: Dermis , Skin: Epidermis debrided: Level: Skin/Epidermis Debridement Description: Selective/Open Wound Instrument: Curette Bleeding: None Response to Treatment: Procedure was tolerated well Level of Consciousness (Post- Awake and Alert procedure): Post Debridement Measurements of Total Wound Length: (cm) 0.2 Stage: Category/Stage III Width: (cm) 0.2 Depth: (cm) 0.2 Volume: (cm) 0.006 Character of  Wound/Ulcer Post Debridement: Stable Post Procedure Diagnosis Same as Pre-procedure Electronic SignaturePattons) Signed: 11/29/2019 4:16:09 PM By: Worthy Keeler PA-C Signed: 11/29/2019 4:54:26 PM By: Gretta Cool, BSN, RN, CWS, Kim RN, BSN Entered By: Gretta Cool, BSN, RN, CWS, Kim on 11/29/2019 13:05:57 [name], [name] ([id number]) -------------------------------------------------------------------------------- HPI Details Patient Name: [name] Date of Service: 11/29/2019 12:30 PM Medical Record Number: [id number] Patient Account Number: [id number] Date of Birth/Sex: [birth year] (86 y.o. M) Treating RN: Cornell Barman Primary Care Provider: [name] Other Clinician: Referring Provider: [name] Treating Provider/Extender: Melburn Hake, Ahna Konkle Weeks in Treatment: 13 History of Present Illness HPI Description: 05/19/18 on evaluation today patient presents for initial evaluation and clinic concerning issues that he has been having with his right medial lower leg ulcer. The patient has actually been seen in the emergency department for cellulitis he was admitted at [facility] on 04/19/18 discharged 04/21/18. He was noted to have proof of vascular disease and did undergo an angiogram on 04/20/18 by Dr. Leotis Pain. 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 Subsequently Dr. dew following intervention noted that the patient had depending on the location 20-40% residual stenosis noted and significant improvement in the overall vascular flow. Obviously this is great news. With that being said the patient's wound he states does seem to be doing some better compared to previous. Upon inspection indeed it appears that he has some epithelialization. Overall I do not see any evidence of significant infection which is great news. No fevers chills noted 05/26/18 on evaluation today patient's wound actually  appears to be doing much better at this time. With that being said I do think the [facility] Dressing a be sticking according to my nurse and this may be causing some issues as well. I'm gonna suggest at this point that we likely add mepitel underneath the [facility] Dressing help prevent this. Other than that everything  seems to be doing excellent. 06/02/18 on evaluation today patient actually appears to be doing decently well in regard to his right lower extremity ulcer. This seems to show signs of good improvement which is excellent news. With that being said he did see vascular the good news is no further intervention is recommended nor necessary at this point. Actually placed in an [employer] and sent orders to home health which in the end it led to her switching the patient from the three layer compression wrap of the right lower extremity to an [employer] wrap. I feel like he did better in the three layer compression. With that being said the patient currently shows no signs of worsening the mepitel did help prevent any sticking to the wound bed which is great news. 06/12/18 an evaluation today patient presents for follow-up concerning his right lower extremity ulceration. The medial aspect ulcer appears to be doing fairly well this time which is good news. He has a blister on the lateral aspect although I'm concerned this may actually be due to the fact that his wrap is sliding down. We seem to be having issues with getting this wrapped appropriately were it will stay in place. Fortunately there does not appear to be any signs of infection at this time which is good news. He is seen with his son present at this point during the visit. 06/16/18 on evaluation today patient appears to be doing rather well and in fact appears to be almost completely healed in regard to his lower extremity ulcer on the right. The wrap much better this week with [place] wrapping it and just keeping him in that same wrap  until follow-up. For that reason we will discontinue home health services. Fortunately there does not appear to be the evidence of infection at this point. 06/23/18 on evaluation today patient appears to be doing very well in regard to his ulcer which in fact appears to be completely healed. He has been tolerating the wraps without complication although he is definitely ready to be out of these considering how well things have done. Fortunately there is not appear to be any signs of infection at this time which is great news. 08/26/2019 upon evaluation today patient appears to be doing somewhat poorly upon initial inspection here in our clinic for his current issue. This is a patient that I previously taken care of with regard to wounds on his legs. With that being said the issues that were having right now are actually separate from that he has a heel ulcer unfortunately and then subsequently also does have a wound in the right gluteal region that appears to be more of a skin tear. The patient does have a history of diabetes mellitus type 2, peripheral vascular disease, hypertension, and chronic kidney disease stage IV. He is also somewhat weak though he does like to walk around which is actually a good thing he does so slowly. He is currently at an assisted living facility at this point. 09/02/2019 upon evaluation today patient appears to be doing well with regard to the wound on his left heel as well as the right gluteal region. He has had the arterial studies I did look up the numbers and it appears that his TBI's were actually pretty good bilaterally measuring about 1 on the left and around 0.64 on the right. With that being said the final report is not here yet we should have that by next week. Nonetheless looking at the results  of the imaging I feel like he has good arterial flow to be able to proceed with debridement today especially on this left heel. 09/09/2019 upon evaluation today patient's  wounds actually appear to be showing signs of improvement. In the gluteal/sacral region he is showing signs of significant improvement and in fact I really do not see much that is exactly open although there is a lot of moisture type breakdown that I do see at this point. Nonetheless I believe that the patient can potentially transition from a dressing to just using a barrier cream in this area. In regard to the heel that it does appear to be showing signs of improvement I think the Iodoflex is doing a good job. 09/16/2019 upon evaluation today patient appears to be doing well with regard to his heel ulcer. He has been tolerating the dressing changes without complication. Fortunately there is no signs of active infection at this point which is great news. There is some question about physical therapy whether he should be cutting back on this or not. With that being said it does not appear its causing any damage to his heel I think he can actually continue with therapy at this point. 09/23/2019 upon evaluation today patient appears to be doing excellent in regard to his heel ulcer. He has been tolerating the dressing changes without complication. Fortunately there is no signs of active infection at this time. No fevers, chills, nausea, vomiting, or diarrhea. 4/22; right heel ulcer at the tip of his heel. Raised edges nonviable surface. We have been using Iodoflex dimensions of the wound improving oThe patient had a fall this week. He had a skin tear on his right upper lateral humeral area. We are asked to look at this today 10/07/2019 upon evaluation today patient appears to be doing better with regard to his heel I think we may be ready to switch to a collagen-based dressing here. In regard to the skin tear unfortunately the think this is good to have to have some of the skin removed it does not seem to be healing as appropriately as we would like. Fortunately there is no signs of active infection at this  time. ATHARVA, MIRSKY ([id number]) 10/14/2019 patient appears to be doing well in regard to both his left heel ulcer as well as his right upper arm ulcer. He has been tolerating the dressing changes without complication. Fortunately there is no signs of active infection at this time. Overall I think the collagen has done well although it sticking somewhat to the arm at this point which I think may be slowing things down to some degree. Fortunately there is no signs of worsening at either location as it is anyway. 5/13; the patient had a wound on the right upper humerus this is fully closed. He has an area on the tip of his left heel. Still open may be slightly smaller but not a lot. Still some depth. We are using silver collagen to this area 5/20 tip of his left heel. About the same as last time no real improvement. We have been using silver collagen. He has home health coming into the facility where he lives 11/15/2019 upon evaluation today patient appears to be doing excellent in regard to his wounds currently. He has been tolerating the dressing changes without complication. Fortunately there is no sign of active infection at this time. No fevers, chills, nausea, vomiting, or diarrhea. 11/29/2019 upon evaluation today patient appears to be doing quite well with regard  to his heel ulcer. This is showing signs of good improvement in fact there is some dry drainage noted on the surface of the wound today but overall I feel like he has made good progress. We are going to clean this away to see exactly what we have. Electronic SignaturePattons) Signed: 11/29/2019 3:55:42 PM By: Worthy Keeler PA-C Entered By: Worthy Keeler on 11/29/2019 15:55:42 [name], [name] ([id number]) -------------------------------------------------------------------------------- Physical Exam Details Patient Name: [name] Date of Service: 11/29/2019 12:30 PM Medical Record Number: [id number] Patient Account Number:  [id number] Date of Birth/Sex: December 04, 1931 (87 y.o. M) Treating RN: Cornell Barman Primary Care Provider: [name] Other Clinician: Referring Provider: [name] Treating Provider/Extender: Melburn Hake, Carmine Youngberg Weeks in Treatment: 59 Constitutional Well-nourished and well-hydrated in no acute distress. Respiratory normal breathing without difficulty. Psychiatric this patient is able to make decisions and demonstrates good insight into disease process. Alert and Oriented x 3. pleasant and cooperative. Notes Patient's wound bed actually showed signs of good granulation underneath the eschar and drainage it was dried on the surface of the wound. This is measuring much smaller though does still have some depth I think were not quite done yet but we are definitely heading in the right direction. Electronic SignaturePattons) Signed: 11/29/2019 3:55:57 PM By: Worthy Keeler PA-C Entered By: Worthy Keeler on 11/29/2019 15:55:56 [name], [name] ([id number]) -------------------------------------------------------------------------------- Physician Orders Details Patient Name: [name] Date of Service: 11/29/2019 12:30 PM Medical Record Number: [id number] Patient Account Number: [id number] Date of Birth/Sex: 06-16-1931 (87 y.o. M) Treating RN: Cornell Barman Primary Care Provider: [name] Other Clinician: Referring Provider: [name] Treating Provider/Extender: Melburn Hake, Roneisha Stern Weeks in Treatment: 72 Verbal / Phone Orders: No Diagnosis Coding ICD-10 Coding Code Description E11.621 Type 2 diabetes mellitus with foot ulcer S40.811D Abrasion of right upper arm, subsequent encounter L89.620 Pressure ulcer of left heel, unstageable I73.89 Other specified peripheral vascular diseases I10 Essential (primary) hypertension N18.4 Chronic kidney disease, stage 4 (severe) Wound Cleansing Wound #2 Left Calcaneus o May Shower, gently pat wound dry prior to applying new dressing. Anesthetic  (add to Medication List) Wound #2 Left Calcaneus o Topical Lidocaine 4% cream applied to wound bed prior to debridement (In Clinic Only). - in clinic only Primary Wound Dressing Wound #2 Left Calcaneus o Silver Collagen - moisten with normal saline Secondary Dressing Wound #2 Left Calcaneus o Boardered Foam Dressing Dressing Change Frequency Wound #2 Left Calcaneus o Three times weekly Follow-up Appointments Wound #2 Left Calcaneus o Return Appointment in 2 weeks. Edema Control Wound #2 Left Calcaneus o Elevate legs to the level of the heart and pump ankles as often as possible Off-Loading Wound #2 Left Calcaneus o Turn and reposition every 2 hours Home Health Wound #2 Left [place] [facility] o Home Health Nurse may visit PRN to address patientos wound care needs. o FACE TO FACE ENCOUNTER: MEDICARE and MEDICAID PATIENTS: I certify that this patient is under my care and that I had a face-to-face encounter that meets the physician face-to-face encounter requirements with this patient on this date. The encounter with the patient was in whole or in part for the following MEDICAL CONDITION: (primary reason for Home Weightman, Ovid R. ([id number]) Healthcare) MEDICAL NECESSITY: I certify, that based on my findings, NURSING services are a medically necessary home health service. HOME BOUND STATUS: I certify that my clinical findings support that this patient is homebound (i.e., Due to illness or injury, pt  requires aid of supportive devices such as crutches, cane, wheelchairs, walkers, the use of special transportation or the assistance of another person to leave their place of residence. There is a normal inability to leave the home and doing so requires considerable and taxing effort. Other absences are for medical reasons / religious services and are infrequent or of short duration when for other reasons). o If current dressing causes  regression in wound condition, may D/C ordered dressing product/s and apply Normal Saline Moist Dressing daily until next [place] / Other MD appointment. [place] of regression in wound condition at [phone]. o Please direct any NON-WOUND related issues/requests for orders to patient's Primary Care Physician Electronic SignaturePattons) Signed: 11/29/2019 4:16:09 PM By: Worthy Keeler PA-C Signed: 11/29/2019 4:54:26 PM By: Gretta Cool, BSN, RN, CWS, Kim RN, BSN Entered By: Gretta Cool, BSN, RN, CWS, Kim on 11/29/2019 13:06:43 [name], [name]) -------------------------------------------------------------------------------- Problem List Details Patient Name: [name] Date of Service: 11/29/2019 12:30 PM Medical Record Number: [id number] Patient Account Number: [id number] Date of Birth/Sex: Apr 19, 1932 (87 y.o. M) Treating RN: Cornell Barman Primary Care Provider: [name] Other Clinician: Referring Provider: [name] Treating Provider/Extender: Melburn Hake, Alysa Duca Weeks in Treatment: 13 Active Problems ICD-10 Encounter Code Description Active Date MDM Diagnosis E11.621 Type 2 diabetes mellitus with foot ulcer 08/26/2019 No Yes S40.811D Abrasion of right upper arm, subsequent encounter 09/30/2019 No Yes L89.620 Pressure ulcer of left heel, unstageable 08/26/2019 No Yes I73.89 Other specified peripheral vascular diseases 08/26/2019 No Yes I10 Essential (primary) hypertension 08/26/2019 No Yes N18.4 Chronic kidney disease, stage 4 (severe) 08/26/2019 No Yes Inactive Problems ICD-10 Code Description Active Date Inactive Date S31.819A Unspecified open wound of right buttock, initial encounter 08/26/2019 08/26/2019 Resolved Problems Electronic SignaturePattons) Signed: 11/29/2019 12:58:19 PM By: Worthy Keeler PA-C Entered By: Worthy Keeler on 11/29/2019 12:58:15 [name], [name]  ([id number]) -------------------------------------------------------------------------------- Progress Note Details Patient Name: [name] Date of Service: 11/29/2019 12:30 PM Medical Record Number: [id number] Patient Account Number: [id number] Date of Birth/Sex: 01-19-1932 (87 y.o. M) Treating RN: Cornell Barman Primary Care Provider: [name] Other Clinician: Referring Provider: [name] Treating Provider/Extender: Melburn Hake, Guillermina Shaft Weeks in Treatment: 13 Subjective Chief Complaint Information obtained from Patient Left heel ulcer History of Present Illness (HPI) 05/19/18 on evaluation today patient presents for initial evaluation and clinic concerning issues that he has been having with his right medial lower leg ulcer. The patient has actually been seen in the emergency department for cellulitis he was admitted at [facility] on 04/19/18 discharged 04/21/18. He was noted to have proof of vascular disease and did undergo an angiogram on 04/20/18 by Dr. Leotis Pain. 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 Subsequently Dr. dew following intervention noted that the patient had depending on the location 20-40% residual stenosis noted and significant improvement in the overall vascular flow. Obviously this is great news. With that being said the patient's wound he states does seem to be doing some better compared to previous. Upon inspection indeed it appears that he has some epithelialization. Overall I do not see any evidence of significant infection which is great news. No fevers chills noted 05/26/18 on evaluation today patient's wound actually appears to be doing much better at this time. With that being said I do think the [facility] Dressing a be sticking according to my nurse  and this may be causing some issues as well. I'm gonna suggest at this point that we likely  add mepitel underneath the [facility] Dressing help prevent this. Other than that everything seems to be doing excellent. 06/02/18 on evaluation today patient actually appears to be doing decently well in regard to his right lower extremity ulcer. This seems to show signs of good improvement which is excellent news. With that being said he did see vascular the good news is no further intervention is recommended nor necessary at this point. Actually placed in an [employer] and sent orders to home health which in the end it led to her switching the patient from the three layer compression wrap of the right lower extremity to an [employer] wrap. I feel like he did better in the three layer compression. With that being said the patient currently shows no signs of worsening the mepitel did help prevent any sticking to the wound bed which is great news. 06/12/18 an evaluation today patient presents for follow-up concerning his right lower extremity ulceration. The medial aspect ulcer appears to be doing fairly well this time which is good news. He has a blister on the lateral aspect although I'm concerned this may actually be due to the fact that his wrap is sliding down. We seem to be having issues with getting this wrapped appropriately were it will stay in place. Fortunately there does not appear to be any signs of infection at this time which is good news. He is seen with his son present at this point during the visit. 06/16/18 on evaluation today patient appears to be doing rather well and in fact appears to be almost completely healed in regard to his lower extremity ulcer on the right. The wrap much better this week with [place] wrapping it and just keeping him in that same wrap until follow-up. For that reason we will discontinue home health services. Fortunately there does not appear to be the evidence of infection at this point. 06/23/18 on evaluation today patient appears to be doing very well in regard  to his ulcer which in fact appears to be completely healed. He has been tolerating the wraps without complication although he is definitely ready to be out of these considering how well things have done. Fortunately there is not appear to be any signs of infection at this time which is great news. 08/26/2019 upon evaluation today patient appears to be doing somewhat poorly upon initial inspection here in our clinic for his current issue. This is a patient that I previously taken care of with regard to wounds on his legs. With that being said the issues that were having right now are actually separate from that he has a heel ulcer unfortunately and then subsequently also does have a wound in the right gluteal region that appears to be more of a skin tear. The patient does have a history of diabetes mellitus type 2, peripheral vascular disease, hypertension, and chronic kidney disease stage IV. He is also somewhat weak though he does like to walk around which is actually a good thing he does so slowly. He is currently at an assisted living facility at this point. 09/02/2019 upon evaluation today patient appears to be doing well with regard to the wound on his left heel as well as the right gluteal region. He has had the arterial studies I did look up the numbers and it appears that his TBI's were actually pretty good bilaterally measuring about  1 on the left and around 0.64 on the right. With that being said the final report is not here yet we should have that by next week. Nonetheless looking at the results of the imaging I feel like he has good arterial flow to be able to proceed with debridement today especially on this left heel. 09/09/2019 upon evaluation today patient's wounds actually appear to be showing signs of improvement. In the gluteal/sacral region he is showing signs of significant improvement and in fact I really do not see much that is exactly open although there is a lot of moisture type  breakdown that I do see at this point. Nonetheless I believe that the patient can potentially transition from a dressing to just using a barrier cream in this area. In regard to the heel that it does appear to be showing signs of improvement I think the Iodoflex is doing a good job. 09/16/2019 upon evaluation today patient appears to be doing well with regard to his heel ulcer. He has been tolerating the dressing changes without complication. Fortunately there is no signs of active infection at this point which is great news. There is some question about physical therapy whether he should be cutting back on this or not. With that being said it does not appear its causing any damage to his heel I think he can actually continue with therapy at this point. 09/23/2019 upon evaluation today patient appears to be doing excellent in regard to his heel ulcer. He has been tolerating the dressing changes without complication. Fortunately there is no signs of active infection at this time. No fevers, chills, nausea, vomiting, or diarrhea. 4/22; right heel ulcer at the tip of his heel. Raised edges nonviable surface. We have been using Iodoflex dimensions of the wound improving The patient had a fall this week. He had a skin tear on his right upper lateral humeral area. We are asked to look at this today FAISAL, STRADLING. ([id number]) 10/07/2019 upon evaluation today patient appears to be doing better with regard to his heel I think we may be ready to switch to a collagen-based dressing here. In regard to the skin tear unfortunately the think this is good to have to have some of the skin removed it does not seem to be healing as appropriately as we would like. Fortunately there is no signs of active infection at this time. 10/14/2019 patient appears to be doing well in regard to both his left heel ulcer as well as his right upper arm ulcer. He has been tolerating the dressing changes without complication. Fortunately  there is no signs of active infection at this time. Overall I think the collagen has done well although it sticking somewhat to the arm at this point which I think may be slowing things down to some degree. Fortunately there is no signs of worsening at either location as it is anyway. 5/13; the patient had a wound on the right upper humerus this is fully closed. He has an area on the tip of his left heel. Still open may be slightly smaller but not a lot. Still some depth. We are using silver collagen to this area 5/20 tip of his left heel. About the same as last time no real improvement. We have been using silver collagen. He has home health coming into the facility where he lives 11/15/2019 upon evaluation today patient appears to be doing excellent in regard to his wounds currently. He has been tolerating the dressing changes  without complication. Fortunately there is no sign of active infection at this time. No fevers, chills, nausea, vomiting, or diarrhea. 11/29/2019 upon evaluation today patient appears to be doing quite well with regard to his heel ulcer. This is showing signs of good improvement in fact there is some dry drainage noted on the surface of the wound today but overall I feel like he has made good progress. We are going to clean this away to see exactly what we have. Objective Constitutional Well-nourished and well-hydrated in no acute distress. Vitals Time Taken: 12:30 PM, Height: 68 in, Weight: 145 lbs, BMI: 22, Temperature: 97.8 F, Pulse: 78 bpm, Respiratory Rate: 16 breaths/min, Blood Pressure: 149/59 mmHg. Respiratory normal breathing without difficulty. Psychiatric this patient is able to make decisions and demonstrates good insight into disease process. Alert and Oriented x 3. pleasant and cooperative. General Notes: Patient's wound bed actually showed signs of good granulation underneath the eschar and drainage it was dried on the surface of the wound. This is  measuring much smaller though does still have some depth I think were not quite done yet but we are definitely heading in the right direction. Integumentary (Hair, Skin) Wound #2 status is Open. Original cause of wound was Gradually Appeared. The wound is located on the Left Calcaneus. The wound measures 0.2cm length x 0.2cm width x 0.2cm depth; 0.031cm^2 area and 0.006cm^3 volume. There is Fat Layer (Subcutaneous Tissue) Exposed exposed. There is a medium amount of serous drainage noted. The wound margin is flat and intact. There is large (67-100%) pink granulation within the wound bed. There is a small (1-33%) amount of necrotic tissue within the wound bed including Adherent Slough. Assessment Active Problems ICD-10 Type 2 diabetes mellitus with foot ulcer Abrasion of right upper arm, subsequent encounter Pressure ulcer of left heel, unstageable Other specified peripheral vascular diseases Essential (primary) hypertension Chronic kidney disease, stage 4 (severe) Ellwood, Ardell R. ([id number]) Procedures Wound #2 Pre-procedure diagnosis of Wound #2 is a Pressure Ulcer located on the Left Calcaneus . There was a Selective/Open Wound Skin/Epidermis Debridement with a total area of 0.04 sq cm performed by STONE III, Tommie Bohlken E., PA-C. With the following instrumentPattons): Curette to remove Non- Viable tissue/material. Material removed includes Eschar, Skin: Dermis, and Skin: Epidermis. No specimens were taken. A time out was conducted at 13:05, prior to the start of the procedure. There was no bleeding. The procedure was tolerated well. Post Debridement Measurements: 0.2cm length x 0.2cm width x 0.2cm depth; 0.006cm^3 volume. Post debridement Stage noted as Category/Stage III. Character of Wound/Ulcer Post Debridement is stable. Post procedure Diagnosis Wound #2: Same as Pre-Procedure Plan Wound Cleansing: Wound #2 Left Calcaneus: May Shower, gently pat wound dry prior to applying new  dressing. Anesthetic (add to Medication List): Wound #2 Left Calcaneus: Topical Lidocaine 4% cream applied to wound bed prior to debridement (In Clinic Only). - in clinic only Primary Wound Dressing: Wound #2 Left Calcaneus: Silver Collagen - moisten with normal saline Secondary Dressing: Wound #2 Left Calcaneus: Boardered Foam Dressing Dressing Change Frequency: Wound #2 Left Calcaneus: Three times weekly Follow-up Appointments: Wound #2 Left Calcaneus: Return Appointment in 2 weeks. Edema Control: Wound #2 Left Calcaneus: Elevate legs to the level of the heart and pump ankles as often as possible Off-Loading: Wound #2 Left Calcaneus: Turn and reposition every 2 hours Home Health: Wound #2 Left Calcaneus: Continue Home Health Visits - [facility] Nurse may visit PRN to address patient s wound care needs. FACE TO  FACE ENCOUNTER: MEDICARE and MEDICAID PATIENTS: I certify that this patient is under my care and that I had a face-to-face encounter that meets the physician face-to-face encounter requirements with this patient on this date. The encounter with the patient was in whole or in part for the following MEDICAL CONDITION: (primary reason for [place]) MEDICAL NECESSITY: I certify, that based on my findings, NURSING services are a medically necessary home health service. HOME BOUND STATUS: I certify that my clinical findings support that this patient is homebound (i.e., Due to illness or injury, pt requires aid of supportive devices such as crutches, cane, wheelchairs, walkers, the use of special transportation or the assistance of another person to leave their place of residence. There is a normal inability to leave the home and doing so requires considerable and taxing effort. Other absences are for medical reasons / religious services and are infrequent or of short duration when for other reasons). If current dressing causes regression in wound condition, may  D/C ordered dressing product/s and apply Normal Saline Moist Dressing daily until next [place] / Other MD appointment. [place] of regression in wound condition at [phone]. Please direct any NON-WOUND related issues/requests for orders to patient's Primary Care Physician 1. I would recommend currently that we go ahead and continue with the silver collagen as that is probably the best treatment option for him at this point. 2. I am also can recommend that we going to continue to cover this with a border foam dressing to protect the area. We will see patient back for reevaluation in 2 weeks here in the clinic. If anything worsens or changes patient will contact our office for additional recommendations. Electronic SignaturePattons) Signed: 11/29/2019 3:56:20 PM By: Worthy Keeler PA-C Entered By: Worthy Keeler on 11/29/2019 15:56:20 Linhares, Delvis RMarland Kitchen ([id number]) -------------------------------------------------------------------------------- SuperBill Details Patient Name: [name] Date of Service: 11/29/2019 Medical Record Number: [id number] Patient Account Number: [id number] Date of Birth/Sex: 01/16/1932 (87 y.o. M) Treating RN: Cornell Barman Primary Care Provider: [name] Other Clinician: Referring Provider: [name] Treating Provider/Extender: Melburn Hake, Willowdean Luhmann Weeks in Treatment: 13 Diagnosis Coding ICD-10 Codes Code Description E11.621 Type 2 diabetes mellitus with foot ulcer S40.811D Abrasion of right upper arm, subsequent encounter L89.620 Pressure ulcer of left heel, unstageable I73.89 Other specified peripheral vascular diseases I10 Essential (primary) hypertension N18.4 Chronic kidney disease, stage 4 (severe) Facility Procedures CPT4 Code: 25003704 Description: 88891 - DEBRIDE WOUND 1ST 20 SQ CM OR < Modifier: Quantity: 1 CPT4 Code: Description: ICD-10 Diagnosis Description L89.620 Pressure ulcer of left heel,  unstageable Modifier: Quantity: Physician Procedures CPT4 Code: 6945038 Description: 88280 - WC PHYS DEBR WO ANESTH 20 SQ CM Modifier: Quantity: 1 CPT4 Code: Description: ICD-10 Diagnosis Description L89.620 Pressure ulcer of left heel, unstageable Modifier: Quantity: Electronic SignaturePattons) Signed: 11/29/2019 3:56:35 PM By: Worthy Keeler PA-C Entered By: Worthy Keeler on 11/29/2019 15:56:34

## 2019-11-30 NOTE — Progress Notes (Signed)
SHIVAN, HODES ([id number]) Visit Report for 11/29/2019 Arrival Information Details Patient Name: [name], [name] Date of Service: 11/29/2019 12:30 PM Medical Record Number: [id number] Patient Account Number: [id number] Date of Birth/Sex: 17-Mar-1932 (86 y.o. M) Treating RN: Cornell Barman Primary Care Ketura Sirek: [name] Other Clinician: Referring Ayliana Casciano: [name] Treating Murphy Bundick/Extender: Melburn Hake, HOYT Weeks in Treatment: 95 Visit Information History Since Last Visit Added or deleted any medications: No Patient Arrived: Walker Any new allergies or adverse reactions: No Arrival Time: 12:33 Had a fall or experienced change in No Accompanied By: daughter activities of daily living that may affect Transfer Assistance: None risk of falls: Patient Identification Verified: Yes Signs or symptoms of abuse/neglect since last visito No Secondary Verification Process Completed: Yes Hospitalized since last visit: No Implantable device outside of the clinic excluding No cellular tissue based products placed in the center since last visit: Has Dressing in Place as Prescribed: Yes Pain Present Now: No Electronic Signature(s) Signed: 11/29/2019 3:23:54 PM By: Lorine Bears RCP, RRT, CHT Entered By: Lorine Bears on 11/29/2019 12:33:43 [place], [name] ([id number]) -------------------------------------------------------------------------------- Lower Extremity Assessment Details Patient Name: [name] Date of Service: 11/29/2019 12:30 PM Medical Record Number: [id number] Patient Account Number: [id number] Date of Birth/Sex: 12/29/1931 (86 y.o. M) Treating RN: Cornell Barman Primary Care Tammey Deeg: [name] Other Clinician: Referring Lauree Yurick: [name] Treating Shareena Nusz/Extender: Melburn Hake, HOYT Weeks in Treatment: 13 Edema Assessment Assessed: Shirlyn Goltz: Yes] [Right: No] Edema: [Left: N] [Right: o] Vascular Assessment Pulses: Dorsalis  Pedis Palpable: [Left:Yes] Electronic Signature(s) Signed: 11/29/2019 4:54:26 PM By: Gretta Cool, BSN, RN, CWS, Kim RN, BSN Entered By: Gretta Cool, BSN, RN, CWS, Kim on 11/29/2019 12:49:25 PREVIN, JIAN ([id number]) -------------------------------------------------------------------------------- Multi Wound Chart Details Patient Name: [name] Date of Service: 11/29/2019 12:30 PM Medical Record Number: [id number] Patient Account Number: [id number] Date of Birth/Sex: 1931/12/15 (87 y.o. M) Treating RN: Cornell Barman Primary Care Satara Virella: [name] Other Clinician: Referring Jezebel Pollet: [name] Treating Aksel Bencomo/Extender: Melburn Hake, HOYT Weeks in Treatment: 13 Vital Signs Height(in): 39 Pulse(bpm): 27 Weight(lbs): 145 Blood Pressure(mmHg): 149/59 Body Mass Index(BMI): 22 Temperature(F): 97.8 Respiratory Rate(breaths/min): 16 Photos: [N/A:N/A] Wound Location: Left Calcaneus N/A N/A Wounding Event: Gradually Appeared N/A N/A Primary Etiology: Pressure Ulcer N/A N/A Comorbid History: Cataracts, Hypertension, Peripheral N/A N/A Venous Disease, Type II Diabetes, End Stage Renal Disease Date Acquired: 07/26/2019 N/A N/A Weeks of Treatment: 13 N/A N/A Wound Status: Open N/A N/A Measurements L x W x D (cm) 0.3x0.4x0.1 N/A N/A Area (cm) : 0.094 N/A N/A Volume (cm) : 0.009 N/A N/A % Reduction in Area: 93.40% N/A N/A % Reduction in Volume: 93.70% N/A N/A Classification: Category/Stage III N/A N/A Exudate Amount: Medium N/A N/A Exudate Type: Serous N/A N/A Exudate Color: amber N/A N/A Wound Margin: Flat and Intact N/A N/A Granulation Amount: Large (67-100%) N/A N/A Granulation Quality: Pink N/A N/A Necrotic Amount: Small (1-33%) N/A N/A Exposed Structures: Fat Layer (Subcutaneous Tissue) N/A N/A Exposed: Yes Fascia: No Tendon: No Muscle: No Joint: No Bone: No Epithelialization: None N/A N/A Treatment Notes Electronic Signature(s) Signed: 11/29/2019 4:54:26 PM By: Gretta Cool,  BSN, RN, CWS, Kim RN, BSN Entered By: Gretta Cool, BSN, RN, CWS, Kim on 11/29/2019 13:01:15 DONOVEN, PETT ([id number]) -------------------------------------------------------------------------------- Multi-Disciplinary Care Plan Details Patient Name: [name] Date of Service: 11/29/2019 12:30 PM Medical Record Number: [id number] Patient Account Number: [id number] Date of Birth/Sex: [birth year] (87 y.o. M) Treating RN: Cornell Barman Primary Care Koni Kannan: [name] Other Clinician: Referring Rodricus Candelaria: [name] Treating  Falisa Lamora/Extender: Melburn Hake, HOYT Weeks in Treatment: 13 Active Inactive Wound/Skin Impairment Nursing Diagnoses: Impaired tissue integrity Goals: Ulcer/skin breakdown will have a volume reduction of 30% by week 4 Date Initiated: 08/26/2019 Target Resolution Date: 09/26/2019 Goal Status: Active Ulcer/skin breakdown will heal within 14 weeks Date Initiated: 11/29/2019 Target Resolution Date: 12/06/2019 Goal Status: Active Interventions: Assess patient/caregiver ability to obtain necessary supplies Treatment Activities: Skin care regimen initiated : 08/26/2019 Notes: Electronic Signature(s) Signed: 11/29/2019 4:54:26 PM By: Gretta Cool, BSN, RN, CWS, Kim RN, BSN Entered By: Gretta Cool, BSN, RN, CWS, Kim on 11/29/2019 12:50:26 [place], [name] ([id number]) -------------------------------------------------------------------------------- Pain Assessment Details Patient Name: [name] Date of Service: 11/29/2019 12:30 PM Medical Record Number: [id number] Patient Account Number: [id number] Date of Birth/Sex: 1932/05/02 (87 y.o. M) Treating RN: Cornell Barman Primary Care Deven Audi: [name] Other Clinician: Referring Emaad Nanna: [name] Treating Sherika Kubicki/Extender: Melburn Hake, HOYT Weeks in Treatment: 13 Active Problems Location of Pain Severity and Description of Pain Patient Has Paino No Site Locations Pain Management and Medication Current Pain  Management: Electronic Signature(s) Signed: 11/29/2019 1:14:54 PM By: Sandre Kitty Signed: 11/29/2019 4:54:26 PM By: Gretta Cool, BSN, RN, CWS, Kim RN, BSN Entered By: Sandre Kitty on 11/29/2019 12:41:20 ZACARIAS, KRAUTER ([id number]) -------------------------------------------------------------------------------- Patient/Caregiver Education Details Patient Name: [name] Date of Service: 11/29/2019 12:30 PM Medical Record Number: [id number] Patient Account Number: [id number] Date of Birth/Gender: Mar 31, 1932 (86 y.o. M) Treating RN: Cornell Barman Primary Care Physician: [name] Other Clinician: Referring Physician: [name] Treating Physician/Extender: Sharalyn Ink in Treatment: 13 Education Assessment Education Provided To: Patient Education Topics Provided Wound/Skin Impairment: Handouts: Caring for Your Ulcer, Other: continue wound care as prescribed. Methods: Demonstration, Explain/Verbal Responses: State content correctly Electronic Signature(s) Signed: 11/29/2019 4:54:26 PM By: Gretta Cool, BSN, RN, CWS, Kim RN, BSN Entered By: Gretta Cool, BSN, RN, CWS, Kim on 11/29/2019 13:07:24 MATTIAS, WALMSLEY ([id number]) -------------------------------------------------------------------------------- Wound Assessment Details Patient Name: [name] Date of Service: 11/29/2019 12:30 PM Medical Record Number: [id number] Patient Account Number: [id number] Date of Birth/Sex: [birth year] (87 y.o. M) Treating RN: Cornell Barman Primary Care Fawnda Vitullo: [name] Other Clinician: Referring Donika Butner: [name] Treating Zaion Hreha/Extender: Melburn Hake, HOYT Weeks in Treatment: 13 Wound Status Wound Number: 2 Primary Pressure Ulcer Etiology: Wound Location: Left Calcaneus Wound Open Wounding Event: Gradually Appeared Status: Date Acquired: 07/26/2019 Comorbid Cataracts, Hypertension, Peripheral Venous Disease, Weeks Of Treatment: 13 History: Type II Diabetes, End Stage Renal  Disease Clustered Wound: No Photos Wound Measurements Length: (cm) 0.2 Width: (cm) 0.2 Depth: (cm) 0.2 Area: (cm) 0.031 Volume: (cm) 0.006 % Reduction in Area: 97.8% % Reduction in Volume: 95.8% Epithelialization: None Wound Description Classification: Category/Stage III Wound Margin: Flat and Intact Exudate Amount: Medium Exudate Type: Serous Exudate Color: amber Foul Odor After Cleansing: No Slough/Fibrino Yes Wound Bed Granulation Amount: Large (67-100%) Exposed Structure Granulation Quality: Pink Fascia Exposed: No Necrotic Amount: Small (1-33%) Fat Layer (Subcutaneous Tissue) Exposed: Yes Necrotic Quality: Adherent Slough Tendon Exposed: No Muscle Exposed: No Joint Exposed: No Bone Exposed: No Electronic Signature(s) Signed: 11/29/2019 4:54:26 PM By: Gretta Cool, BSN, RN, CWS, Kim RN, BSN Entered By: Gretta Cool, BSN, RN, CWS, Kim on 11/29/2019 13:04:41 [place], [name] ([id number]) -------------------------------------------------------------------------------- [place] Details Patient Name: [name] Date of Service: 11/29/2019 12:30 PM Medical Record Number: [id number] Patient Account Number: [id number] Date of Birth/Sex: 13-Mar-1932 (87 y.o. M) Treating RN: Cornell Barman Primary Care Kekai Geter: [name] Other Clinician: Referring Liona Wengert: [name] Treating Zacharie Portner/Extender: Melburn Hake, HOYT Weeks in Treatment: 13 Vital  Signs Time Taken: 12:30 Temperature (F): 97.8 Height (in): 68 Pulse (bpm): 78 Weight (lbs): 145 Respiratory Rate (breaths/min): 16 Body Mass Index (BMI): 22 Blood Pressure (mmHg): 149/59 Reference Range: 80 - 120 mg / dl Electronic Signature(s) Signed: 11/29/2019 3:23:54 PM By: Lorine Bears RCP, RRT, CHT Entered By: Lorine Bears on 11/29/2019 12:38:00

## 2019-12-03 ENCOUNTER — Encounter: Payer: Self-pay | Admitting: Nurse Practitioner

## 2019-12-03 ENCOUNTER — Other Ambulatory Visit: Payer: Self-pay

## 2019-12-03 ENCOUNTER — Non-Acute Institutional Stay: Payer: Medicare Other | Admitting: Nurse Practitioner

## 2019-12-03 DIAGNOSIS — N184 Chronic kidney disease, stage 4 (severe): Secondary | ICD-10-CM

## 2019-12-03 DIAGNOSIS — Z515 Encounter for palliative care: Secondary | ICD-10-CM

## 2019-12-03 NOTE — Progress Notes (Signed)
[place] Consult Note Telephone: [phone]  Fax: [phone]  PATIENT NAME: [name] DOB: 18-Jan-1932 MRN: [id number]  PRIMARY CARE PROVIDER:   [name], MD  REFERRING PROVIDER:  [name], MD [address] [facility] [place],  [place] [address]  RESPONSIBLE PARTY:Daughter in [place] This visitand family meetingwas done face to face in person  RECOMMENDATIONS and PLAN: 1.ACP:DNR, in Epic/Vynca; will continue PTwith ongoing improvements, wishes are no dialysis but to treat what is treatable,  2.Palliative care encounter; Palliative medicine team will continue to support patient, patient's family, and medical team. Visit consisted of counseling and education dealing with the complex and emotionally intense issues of symptom management and palliative care in the setting of serious and potentially life-threatening illness  I spent 60 minutes providing this consultation,  from 11:00am to 12:00pm. More than 50% of the time in this consultation was spent coordinating communication.   HISTORY OF PRESENT ILLNESS:  [name] is a 86 y.o. year old male with multiple medical problems including Dementia, Cancer, iron deficiency anemia , peripheral artery disease, chronic kidney disease, diabetes, heart murmur, hypertension, hyperlipidemia, retinal detachment, polypectomy, right intramedullary nail, hernia repair, eye surgery. Mr. Winer continues to reside at [facility] facility. Mr Sortor had a recent visit with nephrology noted that his creatinine was 4.19 and GF are 15. Follow up visit today with palliative care for further discussion of medical goals of care. Staff endorses Mr. Burks has been improving. Mr. Carsey been ambulating with his walker. The wound to his heel is healing. Mr. Broyhill has continued to go to the dining area to eat with improving appetite. No recent falls, infections,  hospitalizations. At present Mr. Duarte is walking with his walker and the physical therapist. Mr. Sadlon denies any concerns or complaints. Mr. Gains is walking steadily as they have a long walk planned. Mr Bayless he will eat tomatoes that are planted along the way. We talked about how he has been feeling. Mr. Matos endorses that he has been feeling much better. No new concerns or complaints. Mr. Janssen denies pain or shortness of breath. Mr Muniz was cooperative with [place] estimate. Praised Mr Talarico for ambulating with positive attitude. Emotional support provided. I called [name], Mr. Collin power of attorney for further discussion of medical goals of care and palliative care visit. We talked about palliative care visit with Mr. Kropf. We talked about recent Nephrology appointment. Follow-up appointment at [facility] for labs and two weeks. [name] endorses if is GFR drops down to 14 they will consider hospice. We talked about medical goals of care. We talked about quality of life. We talked about role of palliative care in plan of care. We talked about Hospice Services. We talked about follow up visit in three weeks after his [facility] appointment or sooner should he declined. [name] in agreement and appointment schedule. Therapeutic listening, emotional support provided. Contact information provided. Questions answered to satisfaction. Palliative Care was asked to help to continue to address goals of care.   CODE STATUS: DNR  PPS: 50% HOSPICE ELIGIBILITY/DIAGNOSIS: TBD  PAST MEDICAL HISTORY:  Past Medical History:  Diagnosis Date  . Cancer ([place])   . Chronic kidney disease   . Colon polyp   . Diabetes mellitus without complication ([place])   . Heart murmur 2008  . Hyperlipidemia   . Hypertension 1980  . Retinal detachment     SOCIAL HX:  Social History   Tobacco Use  . Smoking status: Never Smoker  . Smokeless tobacco: Never Used  Substance Use Topics  . Alcohol use: No     ALLERGIES:  Allergies  Allergen Reactions  . Meperidine Other (See Comments)    Severe bradycardia Severe bradycardia   . Other     Darvocet-N Lowers heart rate  . Penicillins Rash     PERTINENT MEDICATIONS:  Outpatient Encounter Medications as of 12/03/2019  Medication Sig  . acetaminophen (TYLENOL) 500 MG tablet Take 500 mg by mouth every 12 (twelve) hours as needed for mild pain.  Marland Kitchen amLODipine (NORVASC) 10 MG tablet Take 10 mg by mouth daily.   Marland Kitchen aspirin EC 81 MG EC tablet Take 1 tablet (81 mg total) by mouth daily.  Marland Kitchen B-Complex TABS Take 1 tablet by mouth daily.  . calcitRIOL (ROCALTROL) 0.25 MCG capsule Take 0.25 mcg by mouth daily.   . calcium carbonate (OSCAL) 1500 (600 Ca) MG TABS tablet Take 600 mg of elemental calcium by mouth 2 (two) times daily.  . Cholecalciferol (VITAMIN D) 50 MCG (2000 UT) tablet Take 2,000 Units by mouth daily.  Marland Kitchen denosumab (PROLIA) 60 MG/ML SOSY injection Inject 60 mg into the skin every 6 (six) months.  . ferrous sulfate 325 (65 FE) MG tablet Take 325 mg by mouth daily with breakfast.  . furosemide (LASIX) 20 MG tablet Take 10-40 mg by mouth 2 (two) times daily. Take 2 tablets (40mg ) by mouth every morning and take  tablet (10mg ) by mouth at lunchtime  . JALYN 0.5-0.4 MG CAPS Take 1 capsule by mouth daily.   Marland Kitchen linagliptin (TRADJENTA) 5 MG TABS tablet Take 5 mg by mouth daily.  Marland Kitchen losartan (COZAAR) 25 MG tablet Take 0.5 tablets (12.5 mg total) by mouth 2 (two) times daily.  . Multiple Vitamin (MULTIVITAMIN) capsule Take 1 capsule by mouth daily.  . Nutritional Supplements (FEEDING SUPPLEMENT, NEPRO CARB STEADY,) LIQD Take 237 mLs by mouth 3 (three) times daily with meals.  . polyethylene glycol (MIRALAX / GLYCOLAX) 17 g packet Take 17 g by mouth daily.  . potassium chloride (KLOR-CON) 10 MEQ tablet Take 10 mEq by mouth daily.  Marland Kitchen saccharomyces boulardii (FLORASTOR) 250 MG capsule Take 250 mg by mouth 2 (two) times daily.  . sodium bicarbonate 650  MG tablet Take 650 mg by mouth 2 (two) times daily.  Marland Kitchen triamcinolone ointment (KENALOG) 0.1 % Apply 1 application topically 2 (two) times daily. (apply to arms and legs)   No facility-administered encounter medications on file as of 12/03/2019.    PHYSICAL EXAM:   General: NAD, frail appearing, thin pleasant male Cardiovascular: regular rate and rhythm Pulmonary: clear ant fields Neurological: walks with walker Danney Bungert Ihor Gully, NP

## 2019-12-14 ENCOUNTER — Other Ambulatory Visit: Payer: Self-pay

## 2019-12-14 ENCOUNTER — Encounter: Payer: Medicare Other | Attending: Physician Assistant | Admitting: Physician Assistant

## 2019-12-14 DIAGNOSIS — X58XXXA Exposure to other specified factors, initial encounter: Secondary | ICD-10-CM | POA: Insufficient documentation

## 2019-12-14 DIAGNOSIS — N186 End stage renal disease: Secondary | ICD-10-CM | POA: Diagnosis not present

## 2019-12-14 DIAGNOSIS — E1122 Type 2 diabetes mellitus with diabetic chronic kidney disease: Secondary | ICD-10-CM | POA: Diagnosis not present

## 2019-12-14 DIAGNOSIS — S40811A Abrasion of right upper arm, initial encounter: Secondary | ICD-10-CM | POA: Insufficient documentation

## 2019-12-14 DIAGNOSIS — E11621 Type 2 diabetes mellitus with foot ulcer: Secondary | ICD-10-CM | POA: Insufficient documentation

## 2019-12-14 DIAGNOSIS — I120 Hypertensive chronic kidney disease with stage 5 chronic kidney disease or end stage renal disease: Secondary | ICD-10-CM | POA: Diagnosis not present

## 2019-12-14 DIAGNOSIS — L89620 Pressure ulcer of left heel, unstageable: Secondary | ICD-10-CM | POA: Diagnosis not present

## 2019-12-14 NOTE — Progress Notes (Addendum)
RADIN, RAPTIS ([id number]) Visit Report for 12/14/2019 Arrival Information Details Patient Name: [name], [name] Date of Service: 12/14/2019 11:00 AM Medical Record Number: [id number] Patient Account Number: [id number] Date of Birth/Sex: 05/02/1932 (86 y.o. M) Treating RN: Cornell Barman Primary Care Edina Winningham: [name] Other Clinician: Referring Joson Sapp: [name] Treating Brizeida Mcmurry/Extender: Melburn Hake, HOYT Weeks in Treatment: 15 Visit Information History Since Last Visit Added or deleted any medications: Yes Patient Arrived: Walker Any new allergies or adverse reactions: No Arrival Time: 11:23 Had a fall or experienced change in No Accompanied By: daughter activities of daily living that may affect Transfer Assistance: None risk of falls: Patient Identification Verified: Yes Signs or symptoms of abuse/neglect since last visito No Secondary Verification Process Completed: Yes Hospitalized since last visit: No Implantable device outside of the clinic excluding No cellular tissue based products placed in the center since last visit: Has Dressing in Place as Prescribed: Yes Has Compression in Place as Prescribed: Yes Pain Present Now: No Electronic Signature(s) Signed: 12/14/2019 1:42:04 PM By: Lorine Bears RCP, RRT, CHT Entered By: Lorine Bears on 12/14/2019 11:25:57 Markos, [name] ([id number]) -------------------------------------------------------------------------------- Clinic Level of Care Assessment Details Patient Name: [name] Date of Service: 12/14/2019 11:00 AM Medical Record Number: [id number] Patient Account Number: [id number] Date of Birth/Sex: [birth year] (86 y.o. M) Treating RN: Army Melia Primary Care Autumm Hattery: [name] Other Clinician: Referring Teoman Giraud: [name] Treating Anthony Tamburo/Extender: Melburn Hake, HOYT Weeks in Treatment: 15 Clinic Level of Care Assessment Items TOOL 4 Quantity Score []  - Use when only an  EandM is performed on FOLLOW-UP visit 0 ASSESSMENTS - Nursing Assessment / Reassessment X - Reassessment of Co-morbidities (includes updates in patient status) 1 10 X- 1 5 Reassessment of Adherence to Treatment Plan ASSESSMENTS - Wound and Skin Assessment / Reassessment X - Simple Wound Assessment / Reassessment - one wound 1 5 []  - 0 Complex Wound Assessment / Reassessment - multiple wounds []  - 0 Dermatologic / Skin Assessment (not related to wound area) ASSESSMENTS - Focused Assessment []  - Circumferential Edema Measurements - multi extremities 0 []  - 0 Nutritional Assessment / Counseling / Intervention []  - 0 Lower Extremity Assessment (monofilament, tuning fork, pulses) []  - 0 Peripheral Arterial Disease Assessment (using hand held doppler) ASSESSMENTS - Ostomy and/or Continence Assessment and Care []  - Incontinence Assessment and Management 0 []  - 0 Ostomy Care Assessment and Management (repouching, etc.) PROCESS - Coordination of Care X - Simple Patient / Family Education for ongoing care 1 15 []  - 0 Complex (extensive) Patient / Family Education for ongoing care X- 1 10 Staff obtains Programmer, systems, Records, Test Results / Process Orders []  - 0 Staff telephones HHA, Nursing Homes / Clarify orders / etc []  - 0 Routine Transfer to another Facility (non-emergent condition) []  - 0 Routine Hospital Admission (non-emergent condition) []  - 0 New Admissions / Biomedical engineer / Ordering NPWT, Apligraf, etc. []  - 0 Emergency Hospital Admission (emergent condition) X- 1 10 Simple Discharge Coordination []  - 0 Complex (extensive) Discharge Coordination PROCESS - Special Needs []  - Pediatric / Minor Patient Management 0 []  - 0 Isolation Patient Management []  - 0 Hearing / Language / Visual special needs []  - 0 Assessment of Community assistance (transportation, D/C planning, etc.) []  - 0 Additional assistance / Altered mentation []  - 0 Support Surface(s) Assessment  (bed, cushion, seat, etc.) INTERVENTIONS - Wound Cleansing / Measurement Syring, Derwin R. ([id number]) X- 1 5 Simple Wound Cleansing - one wound []  - 0  Complex Wound Cleansing - multiple wounds X- 1 5 Wound Imaging (photographs - any number of wounds) []  - 0 Wound Tracing (instead of photographs) X- 1 5 Simple Wound Measurement - one wound []  - 0 Complex Wound Measurement - multiple wounds INTERVENTIONS - Wound Dressings X - Small Wound Dressing one or multiple wounds 1 10 []  - 0 Medium Wound Dressing one or multiple wounds []  - 0 Large Wound Dressing one or multiple wounds []  - 0 Application of Medications - topical []  - 0 Application of Medications - injection INTERVENTIONS - Miscellaneous []  - External ear exam 0 []  - 0 Specimen Collection (cultures, biopsies, blood, body fluids, etc.) []  - 0 Specimen(s) / Culture(s) sent or taken to Lab for analysis []  - 0 Patient Transfer (multiple staff / Civil Service fast streamer / Similar devices) []  - 0 Simple Staple / Suture removal (25 or less) []  - 0 Complex Staple / Suture removal (26 or more) []  - 0 Hypo / Hyperglycemic Management (close monitor of Blood Glucose) []  - 0 Ankle / Brachial Index (ABI) - do not check if billed separately X- 1 5 Vital Signs Has the patient been seen at the hospital within the last three years: Yes Total Score: 85 Level Of Care: New/Established - Level 3 Electronic Signature(s) Signed: 12/14/2019 11:51:43 AM By: Army Melia Entered By: Army Melia on 12/14/2019 11:39:03 [place], [name] ([id number]) -------------------------------------------------------------------------------- Encounter Discharge Information Details Patient Name: [name] Date of Service: 12/14/2019 11:00 AM Medical Record Number: [id number] Patient Account Number: [id number] Date of Birth/Sex: July 20, 1931 (86 y.o. M) Treating RN: Army Melia Primary Care Jahaziel Francois: [name] Other Clinician: Referring Tanner Vigna: [name] Treating Shandell Jallow/Extender: Melburn Hake, HOYT Weeks in Treatment: 15 Encounter Discharge Information Items Discharge Condition: Stable Ambulatory Status: Wheelchair Discharge Destination: Skilled Nursing Facility Telephoned: No Orders Sent: Yes Transportation: Private Auto Accompanied By: daughter Schedule Follow-up Appointment: Yes Clinical Summary of Care: Electronic Signature(s) Signed: 12/14/2019 11:51:43 AM By: Army Melia Entered By: Army Melia on 12/14/2019 11:39:36 [place], [name] ([id number]) -------------------------------------------------------------------------------- Lower Extremity Assessment Details Patient Name: [name] Date of Service: 12/14/2019 11:00 AM Medical Record Number: [id number] Patient Account Number: [id number] Date of Birth/Sex: [birth year] (87 y.o. M) Treating RN: Cornell Barman Primary Care Francesco Provencal: [name] Other Clinician: Referring Camil Wilhelmsen: [name] Treating Sahiba Granholm/Extender: Melburn Hake, HOYT Weeks in Treatment: 15 Vascular Assessment Pulses: Dorsalis Pedis Palpable: [Left:Yes] Electronic Signature(s) Signed: 12/14/2019 6:18:36 PM By: Gretta Cool, BSN, RN, CWS, Kim RN, BSN Entered By: Gretta Cool, BSN, RN, CWS, Kim on 12/14/2019 11:29:50 ROWIN, BAYRON ([id number]) -------------------------------------------------------------------------------- Multi Wound Chart Details Patient Name: [name] Date of Service: 12/14/2019 11:00 AM Medical Record Number: [id number] Patient Account Number: [id number] Date of Birth/Sex: 24-May-1932 (87 y.o. M) Treating RN: Army Melia Primary Care Desma Wilkowski: [name] Other Clinician: Referring Courteny Egler: [name] Treating Felesha Moncrieffe/Extender: Melburn Hake, HOYT Weeks in Treatment: 15 Vital Signs Height(in): 68 Pulse(bpm): 61 Weight(lbs): 145 Blood Pressure(mmHg): 138/62 Body Mass Index(BMI): 22 Temperature(F): 97.8 Respiratory Rate(breaths/min): 18 Photos: [N/A:N/A] Wound Location: Left  Calcaneus N/A N/A Wounding Event: Gradually Appeared N/A N/A Primary Etiology: Pressure Ulcer N/A N/A Comorbid History: Cataracts, Hypertension, Peripheral N/A N/A Venous Disease, Type II Diabetes, End Stage Renal Disease Date Acquired: 07/26/2019 N/A N/A Weeks of Treatment: 15 N/A N/A Wound Status: Open N/A N/A Measurements L x W x D (cm) 0.2x0.2x0.1 N/A N/A Area (cm) : 0.031 N/A N/A Volume (cm) : 0.003 N/A N/A % Reduction in Area: 97.80% N/A N/A % Reduction in Volume: 97.90%  N/A N/A Classification: Category/Stage III N/A N/A Exudate Amount: None Present N/A N/A Wound Margin: Flat and Intact N/A N/A Granulation Amount: None Present (0%) N/A N/A Necrotic Amount: Large (67-100%) N/A N/A Necrotic Tissue: Eschar N/A N/A Exposed Structures: Fat Layer (Subcutaneous Tissue) N/A N/A Exposed: Yes Fascia: No Tendon: No Muscle: No Joint: No Bone: No Epithelialization: None N/A N/A Treatment Notes Electronic Signature(s) Signed: 12/14/2019 11:51:43 AM By: Army Melia Entered By: Army Melia on 12/14/2019 11:36:13 [place], [name] ([id number]) -------------------------------------------------------------------------------- [place] Details Patient Name: [name] Date of Service: 12/14/2019 11:00 AM Medical Record Number: [id number] Patient Account Number: [id number] Date of Birth/Sex: 24-Dec-1931 (86 y.o. M) Treating RN: Army Melia Primary Care Araminta Zorn: [name] Other Clinician: Referring Donasia Wimes: [name] Treating Jaret Coppedge/Extender: Melburn Hake, HOYT Weeks in Treatment: 15 Active Inactive Electronic Signature(s) Signed: 12/14/2019 11:51:43 AM By: Army Melia Entered By: Army Melia on 12/14/2019 11:38:00 [place], [name] ([id number]) -------------------------------------------------------------------------------- Pain Assessment Details Patient Name: [name] Date of Service: 12/14/2019 11:00 AM Medical Record Number: [id number] Patient  Account Number: [id number] Date of Birth/Sex: 1931/07/05 (86 y.o. M) Treating RN: Cornell Barman Primary Care Cashus Halterman: [name] Other Clinician: Referring Gwenneth Whiteman: [name] Treating Ahna Konkle/Extender: Melburn Hake, HOYT Weeks in Treatment: 15 Active Problems Location of Pain Severity and Description of Pain Patient Has Paino No Site Locations Pain Management and Medication Current Pain Management: Electronic Signature(s) Signed: 12/14/2019 6:18:36 PM By: Gretta Cool, BSN, RN, CWS, Kim RN, BSN Entered By: Gretta Cool, BSN, RN, CWS, Kim on 12/14/2019 11:28:16 LEA, WALBERT ([id number]) -------------------------------------------------------------------------------- Patient/Caregiver Education Details Patient Name: [name] Date of Service: 12/14/2019 11:00 AM Medical Record Number: [id number] Patient Account Number: [id number] Date of Birth/Gender: Feb 08, 1932 (87 y.o. M) Treating RN: Army Melia Primary Care Physician: [name] Other Clinician: Referring Physician: [name] Treating Physician/Extender: Sharalyn Ink in Treatment: 15 Education Assessment Education Provided To: Patient Education Topics Provided Wound/Skin Impairment: Handouts: Caring for Your Ulcer Methods: Demonstration, Explain/Verbal Responses: State content correctly Electronic Signature(s) Signed: 12/14/2019 11:51:43 AM By: Army Melia Entered By: Army Melia on 12/14/2019 11:39:14 Heemstra, [name] ([id number]) -------------------------------------------------------------------------------- Wound Assessment Details Patient Name: [name] Date of Service: 12/14/2019 11:00 AM Medical Record Number: [id number] Patient Account Number: [id number] Date of Birth/Sex: July 20, 1931 (87 y.o. M) Treating RN: Army Melia Primary Care Cheyenna Pankowski: [name] Other Clinician: Referring Jessikah Dicker: [name] Treating Lisa Milian/Extender: Melburn Hake, HOYT Weeks in Treatment: 15 Wound Status Wound  Number: 2 Primary Pressure Ulcer Etiology: Wound Location: Left Calcaneus Wound Healed - Epithelialized Wounding Event: Gradually Appeared Status: Date Acquired: 07/26/2019 Comorbid Cataracts, Hypertension, Peripheral Venous Disease, Weeks Of Treatment: 15 History: Type II Diabetes, End Stage Renal Disease Clustered Wound: No Photos Wound Measurements Length: (cm) 0 Width: (cm) 0 Depth: (cm) 0 Area: (cm) Volume: (cm) % Reduction in Area: 100% % Reduction in Volume: 100% Epithelialization: None 0 Tunneling: No 0 Undermining: No Wound Description Classification: Category/Stage III Wound Margin: Flat and Intact Exudate Amount: None Present Foul Odor After Cleansing: No Slough/Fibrino No Wound Bed Granulation Amount: None Present (0%) Exposed Structure Necrotic Amount: Large (67-100%) Fascia Exposed: No Necrotic Quality: Eschar Fat Layer (Subcutaneous Tissue) Exposed: Yes Tendon Exposed: No Muscle Exposed: No Joint Exposed: No Bone Exposed: No Electronic Signature(s) Signed: 12/14/2019 11:51:43 AM By: Army Melia Entered By: Army Melia on 12/14/2019 11:37:48 [place], [name] ([id number]) -------------------------------------------------------------------------------- Vitals Details Patient Name: [name] Date of Service: 12/14/2019 11:00 AM Medical Record Number: [id number] Patient Account Number: [id number] Date  of Birth/Sex: [birth year] (86 y.o. M) Treating RN: Cornell Barman Primary Care Zamani Crocker: [name] Other Clinician: Referring Aquila Delaughter: [name] Treating Addaleigh Nicholls/Extender: Melburn Hake, HOYT Weeks in Treatment: 15 Vital Signs Time Taken: 11:25 Temperature (F): 97.8 Height (in): 68 Pulse (bpm): 68 Weight (lbs): 145 Respiratory Rate (breaths/min): 18 Body Mass Index (BMI): 22 Blood Pressure (mmHg): 138/62 Reference Range: 80 - 120 mg / dl Electronic Signature(s) Signed: 12/14/2019 1:42:04 PM By: Lorine Bears RCP, RRT,  CHT Entered By: Lorine Bears on 12/14/2019 11:27:46

## 2019-12-14 NOTE — Progress Notes (Addendum)
KAUSHAL, VANNICE ([id number]) Visit Report for 12/14/2019 Chief Complaint Document Details Patient Name: [name], [name] Date of Service: 12/14/2019 11:00 AM Medical Record Number: [id number] Patient Account Number: [id number] Date of Birth/Sex: 1931-10-18 (86 y.o. M) Treating RN: Cornell Barman Primary Care Provider: [name] Other Clinician: Referring Provider: [name] Treating Provider/Extender: Melburn Hake, Earsie Humm Weeks in Treatment: 15 Information Obtained from: Patient Chief Complaint Left heel ulcer Electronic Signature(s) Signed: 12/14/2019 11:14:49 AM By: Worthy Keeler PA-C Entered By: Worthy Keeler on 12/14/2019 11:14:46 [name], [name] ([id number]) -------------------------------------------------------------------------------- HPI Details Patient Name: [name] Date of Service: 12/14/2019 11:00 AM Medical Record Number: [id number] Patient Account Number: [id number] Date of Birth/Sex: June 28, 1931 (87 y.o. M) Treating RN: Cornell Barman Primary Care Provider: [name] Other Clinician: Referring Provider: [name] Treating Provider/Extender: Melburn Hake, Antha Niday Weeks in Treatment: 15 History of Present Illness HPI Description: 05/19/18 on evaluation today patient presents for initial evaluation and clinic concerning issues that he has been having with his right medial lower leg ulcer. The patient has actually been seen in the emergency department for cellulitis he was admitted at [facility] on 04/19/18 discharged 04/21/18. He was noted to have proof of vascular disease and did undergo an angiogram on 04/20/18 by Dr. Leotis Pain. 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 Subsequently Dr. dew following intervention noted that the patient had depending on the location 20-40% residual stenosis noted and significant improvement in the overall vascular flow. Obviously this  is great news. With that being said the patient's wound he states does seem to be doing some better compared to previous. Upon inspection indeed it appears that he has some epithelialization. Overall I do not see any evidence of significant infection which is great news. No fevers chills noted 05/26/18 on evaluation today patient's wound actually appears to be doing much better at this time. With that being said I do think the [facility] Dressing a be sticking according to my nurse and this may be causing some issues as well. I'm gonna suggest at this point that we likely add mepitel underneath the [facility] Dressing help prevent this. Other than that everything seems to be doing excellent. 06/02/18 on evaluation today patient actually appears to be doing decently well in regard to his right lower extremity ulcer. This seems to show signs of good improvement which is excellent news. With that being said he did see vascular the good news is no further intervention is recommended nor necessary at this point. Actually placed in an [employer] and sent orders to home health which in the end it led to her switching the patient from the three layer compression wrap of the right lower extremity to an [employer] wrap. I feel like he did better in the three layer compression. With that being said the patient currently shows no signs of worsening the mepitel did help prevent any sticking to the wound bed which is great news. 06/12/18 an evaluation today patient presents for follow-up concerning his right lower extremity ulceration. The medial aspect ulcer appears to be doing fairly well this time which is good news. He has a blister on the lateral aspect although I'm concerned this may actually be due to the fact that his wrap is sliding down. We seem to be having issues with getting this wrapped appropriately were it will stay in place. Fortunately  there does not appear to be any signs of infection at this  time which is good news. He is seen with his son present at this point during the visit. 06/16/18 on evaluation today patient appears to be doing rather well and in fact appears to be almost completely healed in regard to his lower extremity ulcer on the right. The wrap much better this week with [place] wrapping it and just keeping him in that same wrap until follow-up. For that reason we will discontinue home health services. Fortunately there does not appear to be the evidence of infection at this point. 06/23/18 on evaluation today patient appears to be doing very well in regard to his ulcer which in fact appears to be completely healed. He has been tolerating the wraps without complication although he is definitely ready to be out of these considering how well things have done. Fortunately there is not appear to be any signs of infection at this time which is great news. 08/26/2019 upon evaluation today patient appears to be doing somewhat poorly upon initial inspection here in our clinic for his current issue. This is a patient that I previously taken care of with regard to wounds on his legs. With that being said the issues that were having right now are actually separate from that he has a heel ulcer unfortunately and then subsequently also does have a wound in the right gluteal region that appears to be more of a skin tear. The patient does have a history of diabetes mellitus type 2, peripheral vascular disease, hypertension, and chronic kidney disease stage IV. He is also somewhat weak though he does like to walk around which is actually a good thing he does so slowly. He is currently at an assisted living facility at this point. 09/02/2019 upon evaluation today patient appears to be doing well with regard to the wound on his left heel as well as the right gluteal region. He has had the arterial studies I did look up the numbers and it appears that his TBI's were actually pretty good bilaterally  measuring about 1 on the left and around 0.64 on the right. With that being said the final report is not here yet we should have that by next week. Nonetheless looking at the results of the imaging I feel like he has good arterial flow to be able to proceed with debridement today especially on this left heel. 09/09/2019 upon evaluation today patient's wounds actually appear to be showing signs of improvement. In the gluteal/sacral region he is showing signs of significant improvement and in fact I really do not see much that is exactly open although there is a lot of moisture type breakdown that I do see at this point. Nonetheless I believe that the patient can potentially transition from a dressing to just using a barrier cream in this area. In regard to the heel that it does appear to be showing signs of improvement I think the Iodoflex is doing a good job. 09/16/2019 upon evaluation today patient appears to be doing well with regard to his heel ulcer. He has been tolerating the dressing changes without complication. Fortunately there is no signs of active infection at this point which is great news. There is some question about physical therapy whether he should be cutting back on this or not. With that being said it does not appear its causing any damage to his heel I think he can actually continue with therapy at this point. 09/23/2019 upon  evaluation today patient appears to be doing excellent in regard to his heel ulcer. He has been tolerating the dressing changes without complication. Fortunately there is no signs of active infection at this time. No fevers, chills, nausea, vomiting, or diarrhea. 4/22; right heel ulcer at the tip of his heel. Raised edges nonviable surface. We have been using Iodoflex dimensions of the wound improving oThe patient had a fall this week. He had a skin tear on his right upper lateral humeral area. We are asked to look at this today 10/07/2019 upon evaluation today  patient appears to be doing better with regard to his heel I think we may be ready to switch to a collagen-based dressing here. In regard to the skin tear unfortunately the think this is good to have to have some of the skin removed it does not seem to be healing as appropriately as we would like. Fortunately there is no signs of active infection at this time. [name], [name] ([id number]) 10/14/2019 patient appears to be doing well in regard to both his left heel ulcer as well as his right upper arm ulcer. He has been tolerating the dressing changes without complication. Fortunately there is no signs of active infection at this time. Overall I think the collagen has done well although it sticking somewhat to the arm at this point which I think may be slowing things down to some degree. Fortunately there is no signs of worsening at either location as it is anyway. 5/13; the patient had a wound on the right upper humerus this is fully closed. He has an area on the tip of his left heel. Still open may be slightly smaller but not a lot. Still some depth. We are using silver collagen to this area 5/20 tip of his left heel. About the same as last time no real improvement. We have been using silver collagen. He has home health coming into the facility where he lives 11/15/2019 upon evaluation today patient appears to be doing excellent in regard to his wounds currently. He has been tolerating the dressing changes without complication. Fortunately there is no sign of active infection at this time. No fevers, chills, nausea, vomiting, or diarrhea. 11/29/2019 upon evaluation today patient appears to be doing quite well with regard to his heel ulcer. This is showing signs of good improvement in fact there is some dry drainage noted on the surface of the wound today but overall I feel like he has made good progress. We are going to clean this away to see exactly what we have. 12/14/2019 on evaluation today patient  actually appears to be doing excellent in regard to his heel ulcer. In fact upon evaluation at this point this appears to be completely healed. I am that I tested just to make sure that there is no evidence of anything open remaining. Electronic Signature(s) Signed: 12/14/2019 3:40:40 PM By: Worthy Keeler PA-C Entered By: Worthy Keeler on 12/14/2019 15:40:40 [name], [name] ([id number]) -------------------------------------------------------------------------------- Physical Exam Details Patient Name: [name] Date of Service: 12/14/2019 11:00 AM Medical Record Number: [id number] Patient Account Number: [id number] Date of Birth/Sex: 1932/02/06 (87 y.o. M) Treating RN: Cornell Barman Primary Care Provider: [name] Other Clinician: Referring Provider: [name] Treating Provider/Extender: Melburn Hake, Egan Sahlin Weeks in Treatment: 47 Constitutional Well-nourished and well-hydrated in no acute distress. Respiratory normal breathing without difficulty. Psychiatric this patient is able to make decisions and demonstrates good insight into disease process. Alert and Oriented x 3. pleasant  and cooperative. Notes Upon inspection patient's wound did have some dry skin noted I was able to clear away some of the surface of this and underneath it does not appear there is any opening at this time. Obviously this is excellent news and I am very pleased as is the patient with the progress that is made. Electronic Signature(s) Signed: 12/14/2019 3:41:04 PM By: Worthy Keeler PA-C Entered By: Worthy Keeler on 12/14/2019 15:41:03 [name], [name] ([id number]) -------------------------------------------------------------------------------- Physician Orders Details Patient Name: [name] Date of Service: 12/14/2019 11:00 AM Medical Record Number: [id number] Patient Account Number: [id number] Date of Birth/Sex: 05-24-1932 (87 y.o. M) Treating RN: Army Melia Primary Care Provider: [name]  Other Clinician: Referring Provider: [name] Treating Provider/Extender: Melburn Hake, Florence Antonelli Weeks in Treatment: 15 Verbal / Phone Orders: No Diagnosis Coding ICD-10 Coding Code Description E11.621 Type 2 diabetes mellitus with foot ulcer S40.811D Abrasion of right upper arm, subsequent encounter L89.620 Pressure ulcer of left heel, unstageable I73.89 Other specified peripheral vascular diseases I10 Essential (primary) hypertension N18.4 Chronic kidney disease, stage 4 (severe) Discharge From [facility] o Discharge from [place] complete Protective dressing for the next week Electronic Signature(s) Signed: 12/14/2019 11:51:43 AM By: Army Melia Signed: 12/14/2019 5:02:19 PM By: Worthy Keeler PA-C Entered By: Army Melia on 12/14/2019 11:38:31 [name], [name] ([id number]) -------------------------------------------------------------------------------- Problem List Details Patient Name: [name] Date of Service: 12/14/2019 11:00 AM Medical Record Number: [id number] Patient Account Number: [id number] Date of Birth/Sex: Aug 02, 1931 (86 y.o. M) Treating RN: Cornell Barman Primary Care Provider: [name] Other Clinician: Referring Provider: [name] Treating Provider/Extender: Melburn Hake, Gradie Ohm Weeks in Treatment: 15 Active Problems ICD-10 Encounter Code Description Active Date MDM Diagnosis E11.621 Type 2 diabetes mellitus with foot ulcer 08/26/2019 No Yes S40.811D Abrasion of right upper arm, subsequent encounter 09/30/2019 No Yes L89.620 Pressure ulcer of left heel, unstageable 08/26/2019 No Yes I73.89 Other specified peripheral vascular diseases 08/26/2019 No Yes I10 Essential (primary) hypertension 08/26/2019 No Yes N18.4 Chronic kidney disease, stage 4 (severe) 08/26/2019 No Yes Inactive Problems ICD-10 Code Description Active Date Inactive Date S31.819A Unspecified open wound of right buttock, initial encounter 08/26/2019 08/26/2019 Resolved  Problems Electronic Signature(s) Signed: 12/14/2019 11:14:36 AM By: Worthy Keeler PA-C Entered By: Worthy Keeler on 12/14/2019 11:14:36 [name], [name] Kitchen ([id number]) -------------------------------------------------------------------------------- Progress Note Details Patient Name: [name] Date of Service: 12/14/2019 11:00 AM Medical Record Number: [id number] Patient Account Number: [id number] Date of Birth/Sex: 1932-03-19 (87 y.o. M) Treating RN: Cornell Barman Primary Care Provider: [name] Other Clinician: Referring Provider: [name] Treating Provider/Extender: Melburn Hake, Brion Hedges Weeks in Treatment: 15 Subjective Chief Complaint Information obtained from Patient Left heel ulcer History of Present Illness (HPI) 05/19/18 on evaluation today patient presents for initial evaluation and clinic concerning issues that he has been having with his right medial lower leg ulcer. The patient has actually been seen in the emergency department for cellulitis he was admitted at [facility] on 04/19/18 discharged 04/21/18. He was noted to have proof of vascular disease and did undergo an angiogram on 04/20/18 by Dr. Leotis Pain. 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 Subsequently Dr. dew following intervention noted that the patient had depending on the location 20-40% residual stenosis noted and significant improvement in the overall vascular flow. Obviously this is great news. With that being said  the patient's wound he states does seem to be doing some better compared to previous. Upon inspection indeed it appears that he has some epithelialization. Overall I do not see any evidence of significant infection which is great news. No fevers chills noted 05/26/18 on evaluation today patient's wound actually appears to be doing much better at this time. With that being said I do think  the [facility] Dressing a be sticking according to my nurse and this may be causing some issues as well. I'm gonna suggest at this point that we likely add mepitel underneath the [facility] Dressing help prevent this. Other than that everything seems to be doing excellent. 06/02/18 on evaluation today patient actually appears to be doing decently well in regard to his right lower extremity ulcer. This seems to show signs of good improvement which is excellent news. With that being said he did see vascular the good news is no further intervention is recommended nor necessary at this point. Actually placed in an [employer] and sent orders to home health which in the end it led to her switching the patient from the three layer compression wrap of the right lower extremity to an [employer] wrap. I feel like he did better in the three layer compression. With that being said the patient currently shows no signs of worsening the mepitel did help prevent any sticking to the wound bed which is great news. 06/12/18 an evaluation today patient presents for follow-up concerning his right lower extremity ulceration. The medial aspect ulcer appears to be doing fairly well this time which is good news. He has a blister on the lateral aspect although I'm concerned this may actually be due to the fact that his wrap is sliding down. We seem to be having issues with getting this wrapped appropriately were it will stay in place. Fortunately there does not appear to be any signs of infection at this time which is good news. He is seen with his son present at this point during the visit. 06/16/18 on evaluation today patient appears to be doing rather well and in fact appears to be almost completely healed in regard to his lower extremity ulcer on the right. The wrap much better this week with [place] wrapping it and just keeping him in that same wrap until follow-up. For that reason we will discontinue home health services.  Fortunately there does not appear to be the evidence of infection at this point. 06/23/18 on evaluation today patient appears to be doing very well in regard to his ulcer which in fact appears to be completely healed. He has been tolerating the wraps without complication although he is definitely ready to be out of these considering how well things have done. Fortunately there is not appear to be any signs of infection at this time which is great news. 08/26/2019 upon evaluation today patient appears to be doing somewhat poorly upon initial inspection here in our clinic for his current issue. This is a patient that I previously taken care of with regard to wounds on his legs. With that being said the issues that were having right now are actually separate from that he has a heel ulcer unfortunately and then subsequently also does have a wound in the right gluteal region that appears to be more of a skin tear. The patient does have a history of diabetes mellitus type 2, peripheral vascular disease, hypertension, and chronic kidney disease stage IV. He is also somewhat weak though he  does like to walk around which is actually a good thing he does so slowly. He is currently at an assisted living facility at this point. 09/02/2019 upon evaluation today patient appears to be doing well with regard to the wound on his left heel as well as the right gluteal region. He has had the arterial studies I did look up the numbers and it appears that his TBI's were actually pretty good bilaterally measuring about 1 on the left and around 0.64 on the right. With that being said the final report is not here yet we should have that by next week. Nonetheless looking at the results of the imaging I feel like he has good arterial flow to be able to proceed with debridement today especially on this left heel. 09/09/2019 upon evaluation today patient's wounds actually appear to be showing signs of improvement. In the  gluteal/sacral region he is showing signs of significant improvement and in fact I really do not see much that is exactly open although there is a lot of moisture type breakdown that I do see at this point. Nonetheless I believe that the patient can potentially transition from a dressing to just using a barrier cream in this area. In regard to the heel that it does appear to be showing signs of improvement I think the Iodoflex is doing a good job. 09/16/2019 upon evaluation today patient appears to be doing well with regard to his heel ulcer. He has been tolerating the dressing changes without complication. Fortunately there is no signs of active infection at this point which is great news. There is some question about physical therapy whether he should be cutting back on this or not. With that being said it does not appear its causing any damage to his heel I think he can actually continue with therapy at this point. 09/23/2019 upon evaluation today patient appears to be doing excellent in regard to his heel ulcer. He has been tolerating the dressing changes without complication. Fortunately there is no signs of active infection at this time. No fevers, chills, nausea, vomiting, or diarrhea. 4/22; right heel ulcer at the tip of his heel. Raised edges nonviable surface. We have been using Iodoflex dimensions of the wound improving The patient had a fall this week. He had a skin tear on his right upper lateral humeral area. We are asked to look at this today [name], YAFFE. ([id number]) 10/07/2019 upon evaluation today patient appears to be doing better with regard to his heel I think we may be ready to switch to a collagen-based dressing here. In regard to the skin tear unfortunately the think this is good to have to have some of the skin removed it does not seem to be healing as appropriately as we would like. Fortunately there is no signs of active infection at this time. 10/14/2019 patient appears to be  doing well in regard to both his left heel ulcer as well as his right upper arm ulcer. He has been tolerating the dressing changes without complication. Fortunately there is no signs of active infection at this time. Overall I think the collagen has done well although it sticking somewhat to the arm at this point which I think may be slowing things down to some degree. Fortunately there is no signs of worsening at either location as it is anyway. 5/13; the patient had a wound on the right upper humerus this is fully closed. He has an area on the tip of his left  heel. Still open may be slightly smaller but not a lot. Still some depth. We are using silver collagen to this area 5/20 tip of his left heel. About the same as last time no real improvement. We have been using silver collagen. He has home health coming into the facility where he lives 11/15/2019 upon evaluation today patient appears to be doing excellent in regard to his wounds currently. He has been tolerating the dressing changes without complication. Fortunately there is no sign of active infection at this time. No fevers, chills, nausea, vomiting, or diarrhea. 11/29/2019 upon evaluation today patient appears to be doing quite well with regard to his heel ulcer. This is showing signs of good improvement in fact there is some dry drainage noted on the surface of the wound today but overall I feel like he has made good progress. We are going to clean this away to see exactly what we have. 12/14/2019 on evaluation today patient actually appears to be doing excellent in regard to his heel ulcer. In fact upon evaluation at this point this appears to be completely healed. I am that I tested just to make sure that there is no evidence of anything open remaining. Objective Constitutional Well-nourished and well-hydrated in no acute distress. Vitals Time Taken: 11:25 AM, Height: 68 in, Weight: 145 lbs, BMI: 22, Temperature: 97.8 F, Pulse: 68 bpm,  Respiratory Rate: 18 breaths/min, Blood Pressure: 138/62 mmHg. Respiratory normal breathing without difficulty. Psychiatric this patient is able to make decisions and demonstrates good insight into disease process. Alert and Oriented x 3. pleasant and cooperative. General Notes: Upon inspection patient's wound did have some dry skin noted I was able to clear away some of the surface of this and underneath it does not appear there is any opening at this time. Obviously this is excellent news and I am very pleased as is the patient with the progress that is made. Integumentary (Hair, Skin) Wound #2 status is Healed - Epithelialized. Original cause of wound was Gradually Appeared. The wound is located on the Left Calcaneus. The wound measures 0cm length x 0cm width x 0cm depth; 0cm^2 area and 0cm^3 volume. There is Fat Layer (Subcutaneous Tissue) Exposed exposed. There is no tunneling or undermining noted. There is a none present amount of drainage noted. The wound margin is flat and intact. There is no granulation within the wound bed. There is a large (67-100%) amount of necrotic tissue within the wound bed including Eschar. Assessment Active Problems ICD-10 Type 2 diabetes mellitus with foot ulcer Abrasion of right upper arm, subsequent encounter Pressure ulcer of left heel, unstageable Other specified peripheral vascular diseases Essential (primary) hypertension Chronic kidney disease, stage 4 (severe) [name], [name] R. ([id number]) Plan Discharge From [facility] Services: Discharge from [place] complete Protective dressing for the next week 1. I would recommend currently that we go ahead and discontinue wound care services I would recommend a protective dressing for 1 week and then subsequently the patient should transition to this using his normal compression sock following. 2. He should continue to monitor for any signs of drainage or worsening in general the hopefully  none of this will be a complication or problem for him if anything does occur he should let me know. Follow-up as needed Electronic Signature(s) Signed: 12/14/2019 3:41:33 PM By: Worthy Keeler PA-C Entered By: Worthy Keeler on 12/14/2019 15:41:33 [name], [name] Kitchen ([id number]) -------------------------------------------------------------------------------- SuperBill Details Patient Name: [name] Date of Service: 12/14/2019 Medical  Record Number: [id number] Patient Account Number: [id number] Date of Birth/Sex: [birth year] (86 y.o. M) Treating RN: Cornell Barman Primary Care Provider: [name] Other Clinician: Referring Provider: [name] Treating Provider/Extender: Melburn Hake, Aaliya Maultsby Weeks in Treatment: 15 Diagnosis Coding ICD-10 Codes Code Description E11.621 Type 2 diabetes mellitus with foot ulcer S40.811D Abrasion of right upper arm, subsequent encounter L89.620 Pressure ulcer of left heel, unstageable I73.89 Other specified peripheral vascular diseases I10 Essential (primary) hypertension N18.4 Chronic kidney disease, stage 4 (severe) Facility Procedures CPT4 Code: 97741423 Description: 99213 - WOUND CARE VISIT-LEV 3 EST PT Modifier: Quantity: 1 Physician Procedures CPT4 Code: 9532023 Description: 34356 - WC PHYS LEVEL 3 - EST PT Modifier: Quantity: 1 CPT4 Code: Description: ICD-10 Diagnosis Description E11.621 Type 2 diabetes mellitus with foot ulcer S40.811D Abrasion of right upper arm, subsequent encounter L89.620 Pressure ulcer of left heel, unstageable I73.89 Other specified peripheral vascular diseases Modifier: Quantity: Electronic Signature(s) Signed: 12/14/2019 3:43:39 PM By: Worthy Keeler PA-C Entered By: Worthy Keeler on 12/14/2019 15:43:38

## 2019-12-24 ENCOUNTER — Non-Acute Institutional Stay: Payer: Medicare Other | Admitting: Nurse Practitioner

## 2019-12-24 ENCOUNTER — Other Ambulatory Visit: Payer: Self-pay

## 2019-12-24 ENCOUNTER — Encounter: Payer: Self-pay | Admitting: Nurse Practitioner

## 2019-12-24 DIAGNOSIS — N184 Chronic kidney disease, stage 4 (severe): Secondary | ICD-10-CM

## 2019-12-24 DIAGNOSIS — Z515 Encounter for palliative care: Secondary | ICD-10-CM

## 2019-12-24 NOTE — Progress Notes (Signed)
[facility] Consult Note Telephone: [phone]  Fax: [phone]  PATIENT NAME: [name] DOB: 1931-11-26 MRN: [id number]  PRIMARY CARE PROVIDER:   [name], MD  REFERRING PROVIDER:  [name], MD [address] [facility] [place],  [place] [address]  RESPONSIBLE PARTY:Daughter in [place] This visitand family meetingwas done face to face in person  RECOMMENDATIONS and PLAN: 1.ACP:DNR, in Epic/Vynca; will continue PTwith ongoing improvements, wishes are no dialysis but to treat what is treatable,  2.Palliative care encounter; Palliative medicine team will continue to support patient, patient's family, and medical team. Visit consisted of counseling and education dealing with the complex and emotionally intense issues of symptom management and palliative care in the setting of serious and potentially life-threatening illness  I spent 90 minutes providing this consultation,  Start at 12:00pm. More than 50% of the time in this consultation was spent coordinating communication.   HISTORY OF PRESENT ILLNESS:  [name] is a 86 y.o. year old male with multiple medical problems including Dementia, Cancer, iron deficiency anemia , peripheral artery disease, chronic kidney disease, diabetes, heart murmur, hypertension, hyperlipidemia, retinal detachment, polypectomy, right intramedullary nail, hernia repair, eye surgery.Mr. [name] continues to reside in assisted living facility at [facility]. Mr. [name] has walk with his walker. Mr. [name] is requiring assistance for adl's though at times he can toilet himself. Mr. [name] has come to the dining hall for his meals and feed themselves. Mr [name] appetite is fair depending on what is being served. Staff endorses Mr. [name] does a lot of the tomatoes that he planted outside with his daughter-in-law [name]. Staff endorses Mr. Kestenbaum has been sleeping a lot more. No recent  hospitalizations, falls, infections. At present Mr. [name] is sleeping in his recliner. Mr. [name] awoke to verbal cues. No visitors present. I visited and observed Mr. [name]. We talked about purpose of palliative care visit. Mr [name] in agreement. We talked about how he was feeling. Mr [name] endorses he is just very very tired. We talked about symptoms of pain and shortness of breath which he denies. We talked about the tomatoes that he planted. Mr. [name] endorses the tomatoes are delicious. We talked about the donkey said he likes to visit. We talked about sleeping a lot. Mr. [name] endorses that he is just very tired. We talked about his abdomen being more swollen. Mr. [name] talked about his family. Mr [name] was cooperative with assessment. We talked about role of palliative care. Discuss with Mr. [name] will meet with his son, daughter and daughter-in-law [name] who is this [place] of attorney. Mr. [name] in agreement. Therapeutic listening and emotional support provided. I met with [name], daughter-in-law, son and daughter separately from Mr. [name]. Clinical update discussed. We talked about chronic disease progression of kidney disease GFR 10. We talked about upcoming appointment next Wednesday with Dr [name] Nephrology. We talked about worsening kidney function. We talked about medications an adjustment. We talked about realistic expectations with progression of kidney disease. Discuss concern of increased fluid in his abdomen, more swollen. We talked about progression towards end of life. We talked about medical goals of care. We talked about hospice benefit under Medicare program and what that would look like. We talked about what services are provided. We talked about role of palliative care plan of care. We talked about quality-of-life. We talked about focusing on the things that he loves. We talked about different ways  of hospice with worsening conditions would manage symptoms. We talked about how to tell Mr.  [name] things are getting worse. Discussed and decided that will wait for Dr. Marchia [name] appointment for further direction on other treatment options for increase in abdominal edema. We talked about should Mr [name] worse than prior to his appointment if they would want him sent to the hospital or keep him at [facility]. [name] endorses that she wishes for the facility to contact her immediately prior to sending Mr. [name] out to the hospital. We talked about plan of care. We talked about role of palliative care and plan of care. Discuss that will touch base with [name] after appointment with Dr. Marchia [name] and neither follow up palliative care visit in one week or refer to Hospice. Family in agreement with plan of care. Updated nursing staff. No new changes at present time.  Palliative Care was asked to help to continue to address goals of care.   CODE STATUS: DNR  PPS: 40% HOSPICE ELIGIBILITY/DIAGNOSIS: TBD  PAST MEDICAL HISTORY:  Past Medical History:  Diagnosis Date  . Cancer ([place])   . Chronic kidney disease   . Colon polyp   . Diabetes mellitus without complication ([place])   . Heart murmur 2008  . Hyperlipidemia   . Hypertension 1980  . Retinal detachment     SOCIAL HX:  Social History   Tobacco Use  . Smoking status: Never Smoker  . Smokeless tobacco: Never Used  Substance Use Topics  . Alcohol use: No    ALLERGIES:  Allergies  Allergen Reactions  . Meperidine Other (See Comments)    Severe bradycardia Severe bradycardia   . Other     Darvocet-N Lowers heart rate  . Penicillins Rash     PERTINENT MEDICATIONS:  Outpatient Encounter Medications as of 12/24/2019  Medication Sig  . acetaminophen (TYLENOL) 500 MG tablet Take 500 mg by mouth every 12 (twelve) hours as needed for mild pain.  Marland Kitchen amLODipine (NORVASC) 10 MG tablet Take 10 mg by mouth daily.   Marland Kitchen aspirin EC 81 MG EC tablet Take 1 tablet (81 mg total) by mouth daily.  Marland Kitchen B-Complex TABS Take 1 tablet by mouth daily.  .  calcitRIOL (ROCALTROL) 0.25 MCG capsule Take 0.25 mcg by mouth daily.   . calcium carbonate (OSCAL) 1500 (600 Ca) MG TABS tablet Take 600 mg of elemental calcium by mouth 2 (two) times daily.  . Cholecalciferol (VITAMIN D) 50 MCG (2000 UT) tablet Take 2,000 Units by mouth daily.  Marland Kitchen denosumab (PROLIA) 60 MG/ML SOSY injection Inject 60 mg into the skin every 6 (six) months.  . ferrous sulfate 325 (65 FE) MG tablet Take 325 mg by mouth daily with breakfast.  . furosemide (LASIX) 20 MG tablet Take 10-40 mg by mouth 2 (two) times daily. Take 2 tablets ('40mg'$ ) by mouth every morning and take  tablet ('10mg'$ ) by mouth at lunchtime  . JALYN 0.5-0.4 MG CAPS Take 1 capsule by mouth daily.   Marland Kitchen linagliptin (TRADJENTA) 5 MG TABS tablet Take 5 mg by mouth daily.  Marland Kitchen losartan (COZAAR) 25 MG tablet Take 0.5 tablets (12.5 mg total) by mouth 2 (two) times daily.  . Multiple Vitamin (MULTIVITAMIN) capsule Take 1 capsule by mouth daily.  . Nutritional Supplements (FEEDING SUPPLEMENT, NEPRO CARB STEADY,) LIQD Take 237 mLs by mouth 3 (three) times daily with meals.  . polyethylene glycol (MIRALAX / GLYCOLAX) 17 g packet Take 17 g by mouth daily.  . potassium chloride (KLOR-CON) 10  MEQ tablet Take 10 mEq by mouth daily.  Marland Kitchen saccharomyces boulardii (FLORASTOR) 250 MG capsule Take 250 mg by mouth 2 (two) times daily.  . sodium bicarbonate 650 MG tablet Take 650 mg by mouth 2 (two) times daily.  Marland Kitchen triamcinolone ointment (KENALOG) 0.1 % Apply 1 application topically 2 (two) times daily. (apply to arms and legs)   No facility-administered encounter medications on file as of 12/24/2019.    PHYSICAL EXAM:   General: NAD, frail appearing, thin, pleasant male Cardiovascular: regular rate and rhythm Pulmonary: clear ant fields Abdomen: round; obese Neurological: generalized weakness, walks with walker  Yashar Inclan Ihor Gully, NP

## 2019-12-30 ENCOUNTER — Telehealth: Payer: Self-pay | Admitting: Nurse Practitioner

## 2019-12-30 NOTE — Telephone Encounter (Signed)
Received a message from [place], Mr Schwarz daughter-in-law for clinical update about Nephrology visit with Dr. Marchia Bond. GFR 14, improved. [name] endorses she and Dr. Marchia Bond, Mr. Gell talked about plan of care. With the improvement of renal function will continue to watch under palliative care rather than transition to hospice at this time. Follow-up visit scheduled for August 20th, 2021 at 12 p.m. for further support, ongoing discussions of medical goals of care. [name] endorses at present time Mr. Nghiem continues to improve. Will continue to monitor for decline  Total time spent 20 minutes  Phone discussion 15 minutes  Documentation 5 minutes

## 2020-01-28 ENCOUNTER — Non-Acute Institutional Stay: Payer: Medicare Other | Admitting: Nurse Practitioner

## 2020-01-28 ENCOUNTER — Other Ambulatory Visit: Payer: Self-pay

## 2020-01-28 ENCOUNTER — Encounter: Payer: Self-pay | Admitting: Nurse Practitioner

## 2020-01-28 DIAGNOSIS — Z515 Encounter for palliative care: Secondary | ICD-10-CM

## 2020-01-28 DIAGNOSIS — N184 Chronic kidney disease, stage 4 (severe): Secondary | ICD-10-CM

## 2020-01-28 NOTE — Progress Notes (Signed)
[facility] Consult Note Telephone: [phone]  Fax: [phone]  PATIENT NAME: [name] DOB: April 03, 1932 MRN: [id number]  PRIMARY CARE PROVIDER:   [name], MD  REFERRING PROVIDER:  [name], MD [address] [facility] [place],  [place] [address]   RESPONSIBLE PARTY:Daughter in [place] This visitand family meetingwas done face to face in person  RECOMMENDATIONS and PLAN: 1.ACP:DNR, in Epic/Vynca; will continue PTwith ongoing improvements, wishes are no dialysis but to treat what is treatable,  2.Palliative care encounter; Palliative medicine team will continue to support patient, patient's family, and medical team. Visit consisted of counseling and education dealing with the complex and emotionally intense issues of symptom management and palliative care in the setting of serious and potentially life-threatening illness  3. F/u visit in 3 weeks with ongoing chronic disease management for CKD V with dementia, slow decline and ongoing discussions of goc, hospice but family not ready as of yet for hospice  I spent 60 minutes providing this consultation,  from 12:00pm to 1:00pm. More than 50% of the time in this consultation was spent coordinating communication.   HISTORY OF PRESENT ILLNESS:  [name] is a 86 y.o. year old male with multiple medical problems including Dementia, Cancer, iron deficiency anemia , peripheral artery disease, chronic kidney disease, diabetes, heart murmur, hypertension, hyperlipidemia, retinal detachment, polypectomy, right intramedullary nail, hernia repair, eye surgery. In-person palliative care follow-up visit for [name] for ongoing monitoring of chronic disease progression secondary to chronic kidney disease, dementia. Staff endorses no new changes are concerns. [name] continues to walk with a walker with no recent calls. [name] does require total ADL  assistance for bathing and dressing. [name] does toilet himself but at times may require assistance. [name] does feed himself and he walks to the dining area for each meal daily. No recent hospitalizations, infections. [name] has continued to work with physical therapy for strengthening, endurance. [name] has continued to be able to verbalize his needs. At present [name] is sitting in the recliner in his room. [name] appears elderly, comfortable. No visitors present. I visited and observed [name]. We talked about how he's feeling. [name] endorses that he is doing well. We talked about symptoms of pain and shortness of breath which he denies. We talked about his lower extremities with less edema. His abdomen does appear edematous so has improved since last palliative care visit. We talked about his daily routine. We talked about his daily visits to see the donkeys reside on the property. We talked about the tomatoes that he and his daughter-in-law [name] planted. [name] endorses he does eat tomatoes everyday picking them up the vine taste really good. We talked about quality of life. [name] talked about his family visiting. We talked about role of palliative care and plan of care. Some limited understanding and will review medical goals of care with [place] of attorney daughter-in-law [name]. Emotional support provided. [name] endorses he was thankful for palliative care visit today. I called [name], [name] Glascoe daughter-in-law, [place] power of attorney. We talked about [name] has been feeling. [name] endorses things have been about the same no new changes or concerns. [name] endorses continuing to monitor blood pressures with BUN and creatinine. [name] endorses her husband, [name] son has been sick so he has not been able to see [name] recently. [name] endorses that  overall she feels like [name]. Riordan is doing about as good as he can. We talked about the donkeys and the  tomatoes. We talked about quality-of-life. We talked about medical goals of care. We talked about caregiver fatigue as [name] is struggling many things. We talked about continuing to follow with palliative, monitoring for worsening decline, his kidney function had improved. Follow up visit in 4 weeks or sooner should he declined, appointment scheduled. Therapeutic listening and emotional support provided. Contact information. Questions answered to satisfaction. Updated nursing staff in the new changes to current goals or plan of care.  Palliative Care was asked to help to continue to address goals of care.   CODE STATUS: DNR  PPS: 40% HOSPICE ELIGIBILITY/DIAGNOSIS: with CKD V  PAST MEDICAL HISTORY:  Past Medical History:  Diagnosis Date  . Cancer ([place])   . Chronic kidney disease   . Colon polyp   . Diabetes mellitus without complication ([place])   . Heart murmur 2008  . Hyperlipidemia   . Hypertension 1980  . Retinal detachment     SOCIAL HX:  Social History   Tobacco Use  . Smoking status: Never Smoker  . Smokeless tobacco: Never Used  Substance Use Topics  . Alcohol use: No    ALLERGIES:  Allergies  Allergen Reactions  . Meperidine Other (See Comments)    Severe bradycardia Severe bradycardia   . Other     Darvocet-N Lowers heart rate  . Penicillins Rash     PERTINENT MEDICATIONS:  Outpatient Encounter Medications as of 01/28/2020  Medication Sig  . acetaminophen (TYLENOL) 500 MG tablet Take 500 mg by mouth every 12 (twelve) hours as needed for mild pain.  Marland Kitchen amLODipine (NORVASC) 10 MG tablet Take 10 mg by mouth daily.   Marland Kitchen aspirin EC 81 MG EC tablet Take 1 tablet (81 mg total) by mouth daily.  Marland Kitchen B-Complex TABS Take 1 tablet by mouth daily.  . calcitRIOL (ROCALTROL) 0.25 MCG capsule Take 0.25 mcg by mouth daily.   . calcium carbonate (OSCAL) 1500 (600 Ca) MG TABS tablet Take 600 mg of elemental calcium by mouth 2 (two) times daily.  . Cholecalciferol (VITAMIN D) 50  MCG (2000 UT) tablet Take 2,000 Units by mouth daily.  Marland Kitchen denosumab (PROLIA) 60 MG/ML SOSY injection Inject 60 mg into the skin every 6 (six) months.  . ferrous sulfate 325 (65 FE) MG tablet Take 325 mg by mouth daily with breakfast.  . furosemide (LASIX) 20 MG tablet Take 10-40 mg by mouth 2 (two) times daily. Take 2 tablets (40mg ) by mouth every morning and take  tablet (10mg ) by mouth at lunchtime  . JALYN 0.5-0.4 MG CAPS Take 1 capsule by mouth daily.   Marland Kitchen linagliptin (TRADJENTA) 5 MG TABS tablet Take 5 mg by mouth daily.  Marland Kitchen losartan (COZAAR) 25 MG tablet Take 0.5 tablets (12.5 mg total) by mouth 2 (two) times daily.  . Multiple Vitamin (MULTIVITAMIN) capsule Take 1 capsule by mouth daily.  . Nutritional Supplements (FEEDING SUPPLEMENT, NEPRO CARB STEADY,) LIQD Take 237 mLs by mouth 3 (three) times daily with meals.  . polyethylene glycol (MIRALAX / GLYCOLAX) 17 g packet Take 17 g by mouth daily.  . potassium chloride (KLOR-CON) 10 MEQ tablet Take 10 mEq by mouth daily.  Marland Kitchen saccharomyces boulardii (FLORASTOR) 250 MG capsule Take 250 mg by mouth 2 (two) times daily.  . sodium bicarbonate 650 MG tablet Take 650 mg by mouth 2 (two) times daily.  Marland Kitchen triamcinolone ointment (KENALOG) 0.1 %  Apply 1 application topically 2 (two) times daily. (apply to arms and legs)   No facility-administered encounter medications on file as of 01/28/2020.    PHYSICAL EXAM:   General: NAD, frail appearing, thin, pleasant male Cardiovascular: regular rate and rhythm Pulmonary: clear ant fields Neurological: generalized weakness; walks with walker  Shylo Zamor Ihor Gully, NP

## 2020-02-22 ENCOUNTER — Telehealth: Payer: Self-pay | Admitting: Nurse Practitioner

## 2020-02-22 NOTE — Telephone Encounter (Signed)
I returned return [name], [name] daughter-in-laws call. [name] endorses they had a Nephrology visit and she wanted to update with his GF are down to 11. We talked about upcoming appointment this Friday for palliative care and will continue with that appointment. [name] talked at length about the discussion they had with Nephrology and hospice. [name] and entertain about the discussion they had with Nephrology and hospice. [name] and Dr. Bridgett Larsson both agree that it may make [name] sad to have Hospice Services. They did ask [name] about hospice and he shared he was not ready for hospice. [name] endorses they do not want to go against [name] wishes. We talked about concern for each increasing skill level an Assisted Living facility that they may be the ones that request hospice. [name] endorses she is aware and will continue to work with the facility to keep him at [facility]. [name] endorses he had a dizzy episode recently in the bathroom for which Dr Bridgett Larsson felt like that was not unusual. [name] did go and lay down until about lunch time then got up and walked to the dining area without difficulty. [name] endorses blood pressures are normally in the 150s and at the time of the incident he was 138. We talked about further discussion with upcoming palliative care visit. [name] in agreement. Therapeutic listening and emotional support provided. Contact information. Questions answered to satisfaction.  Total time spent 25 minutes  Documentation 5 minutes  Phone discussion 20 minutes

## 2020-02-25 ENCOUNTER — Encounter: Payer: Self-pay | Admitting: Nurse Practitioner

## 2020-02-25 ENCOUNTER — Other Ambulatory Visit: Payer: Self-pay

## 2020-02-25 ENCOUNTER — Non-Acute Institutional Stay: Payer: Medicare Other | Admitting: Nurse Practitioner

## 2020-02-25 DIAGNOSIS — N184 Chronic kidney disease, stage 4 (severe): Secondary | ICD-10-CM

## 2020-02-25 DIAGNOSIS — Z515 Encounter for palliative care: Secondary | ICD-10-CM

## 2020-02-25 NOTE — Progress Notes (Signed)
[facility] Consult Note Telephone: [phone]  Fax: [phone]  PATIENT NAME: [name] DOB: September 22, 1931 MRN: [id number]  PRIMARY CARE PROVIDER:   [name], MD  REFERRING PROVIDER:  [name], MD [address] [facility] [place],  [place] [address] RESPONSIBLE PARTY:Daughter in [place] and PLAN: 1.ACP:DNR, in Epic/Vynca; will continue PTwith ongoing improvements, wishes are no dialysis but to treat what is treatable,  2.Palliative care encounter; Palliative medicine team will continue to support patient, patient's family, and medical team. Visit consisted of counseling and education dealing with the complex and emotionally intense issues of symptom management and palliative care in the setting of serious and potentially life-threatening illness  3. F/u visit in 4 weeks with ongoing chronic disease management for CKD V with dementia, slow decline and ongoing discussions of goc, hospice but family not ready as of yet for hospice  I spent 60 minutes providing this consultation,  Start at 12:00pm More than 50% of the time in this consultation was spent coordinating communication.   HISTORY OF PRESENT ILLNESS:  [name] is a 86 y.o. year old male with multiple medical problems including Dementia, Cancer, iron deficiency anemia , peripheral artery disease, chronic kidney disease, diabetes, heart murmur, hypertension, hyperlipidemia, retinal detachment, polypectomy, right intramedullary nail, hernia repair, eye surgery. Follow up palliative care visit for [name] at [facility]. Mr [name] continues to walk with his walker slow gait, no recent falls. [name] does require assistance for adl's but is able to toilet himself. [name] continues to go to the dining area for his meals and appetite has been variable depending on what is being served. [name] got the okay from the  nephrologist to eat what he wants so he is happy about that. Last apology visit GFR was 11 but they decided to continue with palliative and hold off on Hospice. Staff endorses no new changes or concerns. I visited and observed [name]. We talked about how he has feeling. [name] endorses that he is doing well. Mr [name] has no complaints or concerns, smiling making eye contact. 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 We talked about his daily routine. Mr [name] was cooperative with assessment. We talked about medical goals, briefly about his appointment with Nephrology. We talked about role of palliative care. Therapeutic listening and emotional support provided. I called his daughter-in-law [name] with update on palliative care visit. We talked about visit with [name]. We talked about symptoms, appetite he appears like he is continuing to do fair. We talked about continuing to monitor and follow with palliative care closely with next visit in 4 weeks or sooner should he declined. [name] in agreement. I updated nursing staff. No new changes to current goals are plan of care. . Palliative Care was asked to help to continue to address goals of care.   CODE STATUS: DNR  PPS: 40% HOSPICE ELIGIBILITY/DIAGNOSIS: TBD  PAST MEDICAL HISTORY:  Past Medical History:  Diagnosis Date  . Cancer ([place])   . Chronic kidney disease   . Colon polyp   . Diabetes mellitus without complication ([place])   . Heart murmur 2008  . Hyperlipidemia   . Hypertension 1980  . Retinal detachment     SOCIAL HX:  Social History   Tobacco Use  . Smoking status: Never Smoker  . Smokeless tobacco: Never  Used  Substance Use Topics  . Alcohol use: No    ALLERGIES:  Allergies  Allergen Reactions  . Meperidine Other (See Comments)    Severe bradycardia Severe bradycardia   . Other     Darvocet-N Lowers heart rate  . Penicillins Rash     PERTINENT MEDICATIONS:  Outpatient  Encounter Medications as of 02/25/2020  Medication Sig  . acetaminophen (TYLENOL) 500 MG tablet Take 500 mg by mouth every 12 (twelve) hours as needed for mild pain.  Marland Kitchen amLODipine (NORVASC) 10 MG tablet Take 10 mg by mouth daily.   Marland Kitchen aspirin EC 81 MG EC tablet Take 1 tablet (81 mg total) by mouth daily.  Marland Kitchen B-Complex TABS Take 1 tablet by mouth daily.  . calcitRIOL (ROCALTROL) 0.25 MCG capsule Take 0.25 mcg by mouth daily.   . calcium carbonate (OSCAL) 1500 (600 Ca) MG TABS tablet Take 600 mg of elemental calcium by mouth 2 (two) times daily.  . Cholecalciferol (VITAMIN D) 50 MCG (2000 UT) tablet Take 2,000 Units by mouth daily.  Marland Kitchen denosumab (PROLIA) 60 MG/ML SOSY injection Inject 60 mg into the skin every 6 (six) months.  . ferrous sulfate 325 (65 FE) MG tablet Take 325 mg by mouth daily with breakfast.  . furosemide (LASIX) 20 MG tablet Take 10-40 mg by mouth 2 (two) times daily. Take 2 tablets (40mg ) by mouth every morning and take  tablet (10mg ) by mouth at lunchtime  . JALYN 0.5-0.4 MG CAPS Take 1 capsule by mouth daily.   Marland Kitchen linagliptin (TRADJENTA) 5 MG TABS tablet Take 5 mg by mouth daily.  Marland Kitchen losartan (COZAAR) 25 MG tablet Take 0.5 tablets (12.5 mg total) by mouth 2 (two) times daily.  . Multiple Vitamin (MULTIVITAMIN) capsule Take 1 capsule by mouth daily.  . Nutritional Supplements (FEEDING SUPPLEMENT, NEPRO CARB STEADY,) LIQD Take 237 mLs by mouth 3 (three) times daily with meals.  . polyethylene glycol (MIRALAX / GLYCOLAX) 17 g packet Take 17 g by mouth daily.  . potassium chloride (KLOR-CON) 10 MEQ tablet Take 10 mEq by mouth daily.  Marland Kitchen saccharomyces boulardii (FLORASTOR) 250 MG capsule Take 250 mg by mouth 2 (two) times daily.  . sodium bicarbonate 650 MG tablet Take 650 mg by mouth 2 (two) times daily.  Marland Kitchen triamcinolone ointment (KENALOG) 0.1 % Apply 1 application topically 2 (two) times daily. (apply to arms and legs)   No facility-administered encounter medications on file as of  02/25/2020.    PHYSICAL EXAM:   General: NAD, frail appearing, thin, pleasant male Cardiovascular: regular rate and rhythm Pulmonary: clear ant fields Neurological: generalized weakness, walks with walker  Maleeya Peterkin Ihor Gully, NP

## 2020-03-07 IMAGING — DX DG CHEST 1V PORT
1 series · 1 of 1 positions shown · non-contrast
Comparison: Chest x-ray 06/25/2019

CLINICAL DATA: Respiratory failure. [id number] pneumonia.

EXAM:
PORTABLE CHEST 1 VIEW

[chest ap]
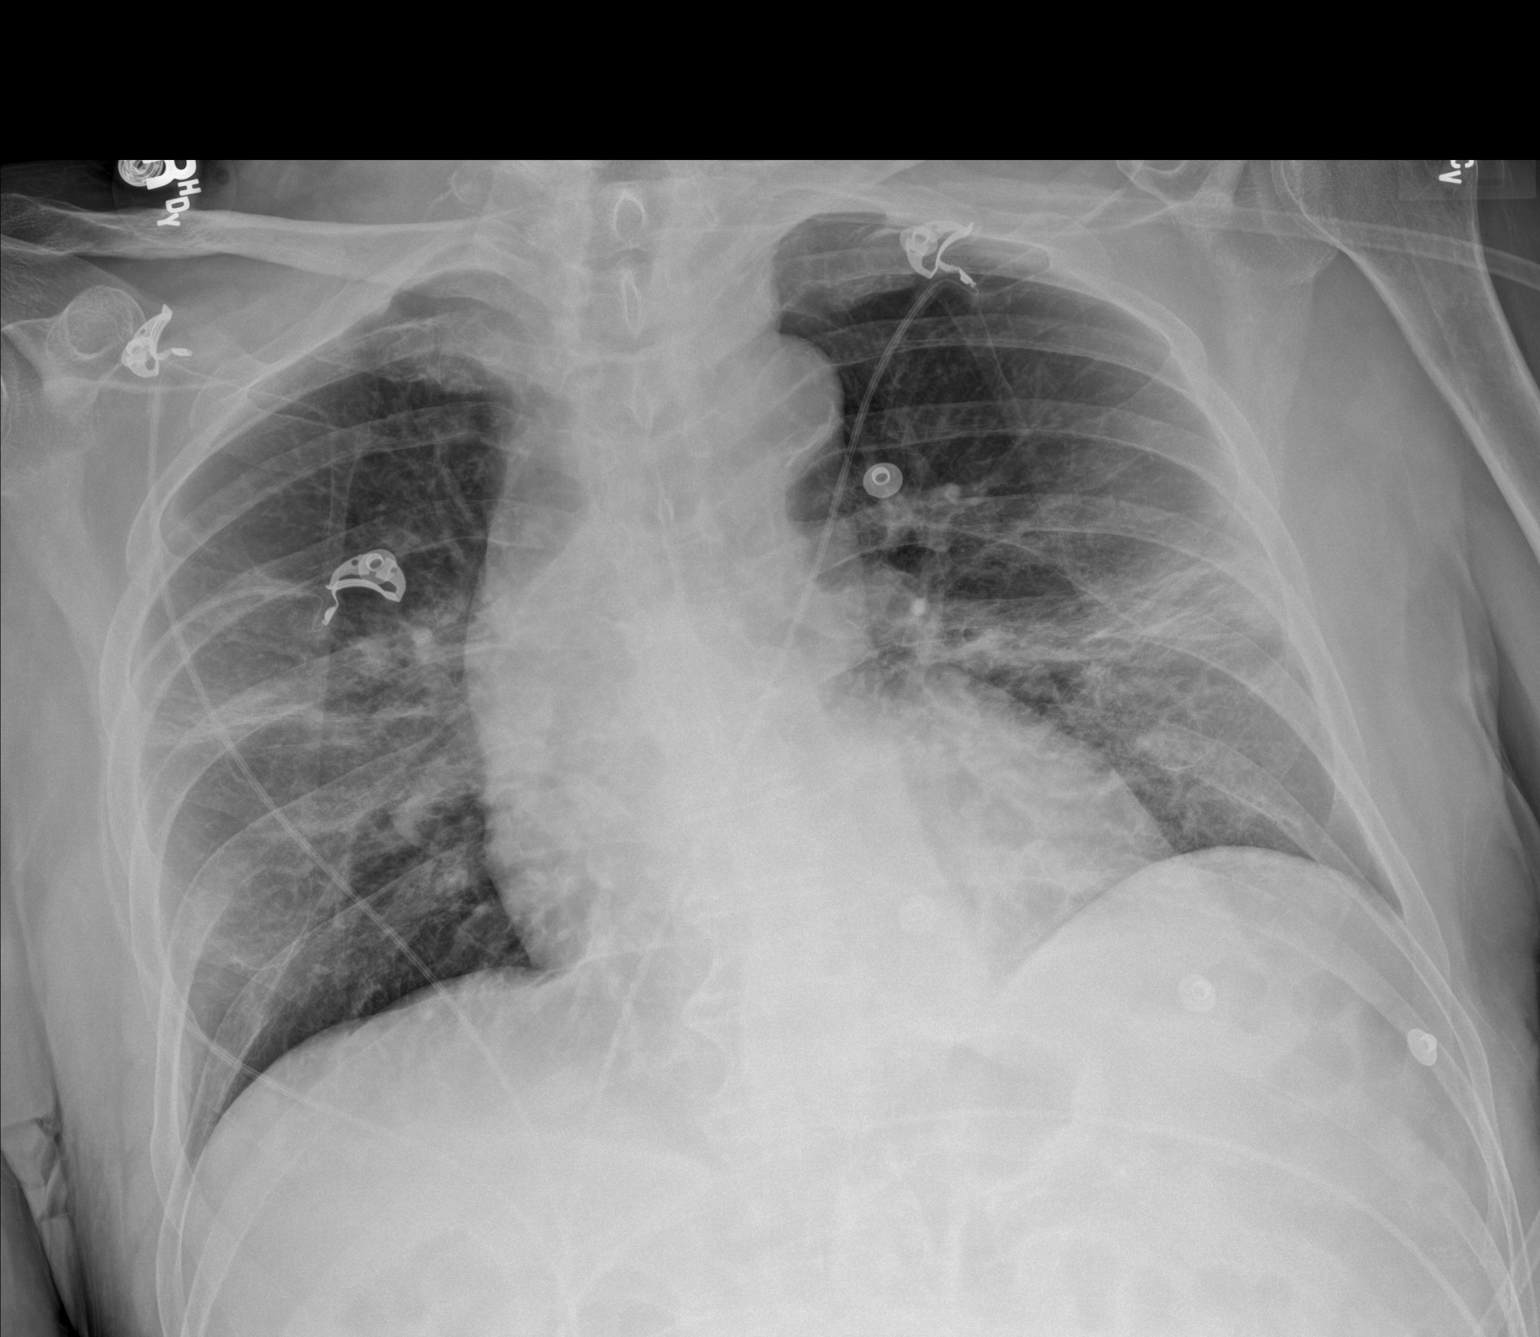

[1 of 1 positions shown; findings below may reference images not displayed]

FINDINGS: Heart size is normal. Patchy airspace opacities are seen
bilaterally. Aeration is improving. Lung volumes have slightly
improved.
IMPRESSION: Improving aeration with persistent patchy bilateral airspace
disease.

## 2020-03-07 IMAGING — NM NM PULMONARY PERF PARTICULATE
1 series · 8 of 8 positions shown · non-contrast
Comparison: Chest x-ray

CLINICAL DATA: Respiratory failure.  COVID positive.

EXAM:
NUCLEAR MEDICINE PERFUSION LUNG SCAN
TECHNIQUE: Perfusion images were obtained in multiple projections after
intravenous injection of radiopharmaceutical.
Ventilation scans intentionally deferred if perfusion scan and chest
x-ray adequate for interpretation during COVID 19 epidemic.
RADIOPHARMACEUTICALS:  3.2 mCi Fc-22m MAA IV

[Series 1000: lung perfusion · 1.65mm/px · 4 acquisitions, 8 frames shown]
[im 1/4]
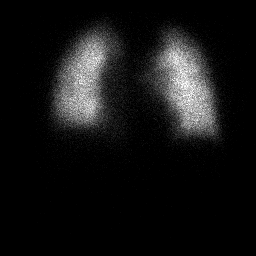
[im 1/4]
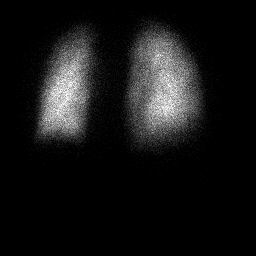
[im 2/4]
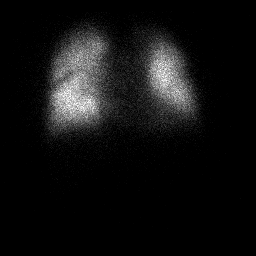
[im 2/4]
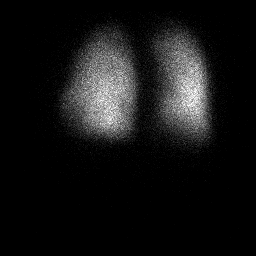
[im 3/4  full-range]
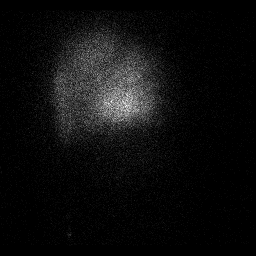
[im 3/4  full-range]
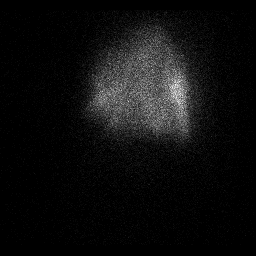
[im 4/4  full-range]
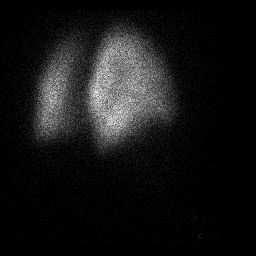
[im 4/4  full-range]
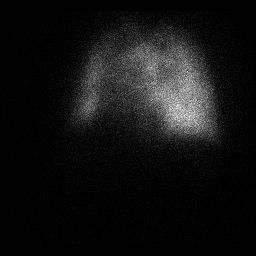

[8 of 8 positions shown; findings below may reference images not displayed]

FINDINGS: No segmental or subsegmental perfusion defects to suggest pulmonary
embolism.
IMPRESSION: Negative pulmonary perfusion study for pulmonary embolism.

## 2020-03-24 ENCOUNTER — Encounter: Payer: Self-pay | Admitting: Nurse Practitioner

## 2020-03-24 ENCOUNTER — Other Ambulatory Visit: Payer: Self-pay

## 2020-03-24 ENCOUNTER — Non-Acute Institutional Stay: Payer: Medicare Other | Admitting: Nurse Practitioner

## 2020-03-24 DIAGNOSIS — N184 Chronic kidney disease, stage 4 (severe): Secondary | ICD-10-CM

## 2020-03-24 DIAGNOSIS — Z515 Encounter for palliative care: Secondary | ICD-10-CM

## 2020-03-24 NOTE — Progress Notes (Signed)
[facility] Consult Note Telephone: [phone]  Fax: [phone]  PATIENT NAME: [name] DOB: 16-Jan-1932 MRN: [id number]  PRIMARY CARE PROVIDER:   [name], MD  REFERRING PROVIDER:  [name], MD [address] [facility] [place],  [place] [address]  RESPONSIBLE PARTY:Daughter in [place] [phone]  RECOMMENDATIONS and PLAN: 1.ACP:DNR, in Epic/Vynca; will continue PTwith ongoing improvements, wishes are no dialysis but to treat what is treatable,  2.Palliative care encounter; Palliative medicine team will continue to support patient, patient's family, and medical team. Visit consisted of counseling and education dealing with the complex and emotionally intense issues of symptom management and palliative care in the setting of serious and potentially life-threatening illness  3. F/u visit in 4 weeks with ongoing chronic disease management for CKD V with dementia, slow decline and ongoing discussions of goc, hospice but family not ready as of yet for hospice  I spent 50 minutes providing this consultation,  Start at 12:00pm. More than 50% of the time in this consultation was spent coordinating communication.   HISTORY OF PRESENT ILLNESS:  [name] is a 86 y.o. year old male with multiple medical problems including Dementia, Cancer, iron deficiency anemia , peripheral artery disease, chronic kidney disease, diabetes, heart murmur, hypertension, hyperlipidemia, retinal detachment, polypectomy, right intramedullary nail, hernia repair, eye surgery.[name] continues to reside at [place] [facility]. [name] does continue to walk with his walker. [name] requires assistance with ADLs, dressing. [name] walks to the dining area for each meal. [name] feeds himself. Staff endorses has his appetite has been improving. No recent falls, hospitalizations, infections per staff. Staff  endorses no other changes or concerns. At present [name] is sitting at the table in the common dining area. [name] appears comfortable. No visitors present. I visited and observed [name]. We talked about purpose of palliative care visit. [name] in agreement. [name] was bleeding on his chin. [name] endorses that must have been from when he was shaving. Notified staff. [name] and I talked about how he was feeling today. [name] endorses he is doing fine. 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 We talked about symptoms of pain. [name] endorses he is not having pain or trouble with shortness of breath. We talked about sleeping. [name] endorses he seems to be sleeping okay. We talked about appetite. [name] endorses that he does like to eat. Most of Palliative care visit supportive. Medical goals reviewed. I have attempted to contact [name], his daughter-in-law, [place], unable to leave a message. Therapeutic listening and emotional support provided. Questions answered that satisfaction. I have updated nursing staff no changes at present time to goals or plan of care. .  Palliative Care was asked to help to continue to address goals of care.   CODE STATUS: DNR  PPS: 40% HOSPICE ELIGIBILITY/DIAGNOSIS: TBD  PAST MEDICAL HISTORY:  Past Medical History:  Diagnosis Date   Cancer ([facility])    Chronic kidney disease    Colon polyp    Diabetes mellitus without complication ([place])    Heart murmur 2008   Hyperlipidemia    Hypertension 1980   Retinal detachment     SOCIAL HX:  Social History   Tobacco Use   Smoking status:  Never Smoker   Smokeless tobacco: Never Used  Substance Use Topics   Alcohol use: No    ALLERGIES:  Allergies  Allergen Reactions   Meperidine Other (See Comments)    Severe bradycardia Severe bradycardia    Other      Darvocet-N Lowers heart rate   Penicillins Rash     PERTINENT MEDICATIONS:  Outpatient Encounter Medications as of 03/24/2020  Medication Sig   acetaminophen (TYLENOL) 500 MG tablet Take 500 mg by mouth every 12 (twelve) hours as needed for mild pain.   amLODipine (NORVASC) 10 MG tablet Take 10 mg by mouth daily.    aspirin EC 81 MG EC tablet Take 1 tablet (81 mg total) by mouth daily.   B-Complex TABS Take 1 tablet by mouth daily.   calcitRIOL (ROCALTROL) 0.25 MCG capsule Take 0.25 mcg by mouth daily.    calcium carbonate (OSCAL) 1500 (600 Ca) MG TABS tablet Take 600 mg of elemental calcium by mouth 2 (two) times daily.   Cholecalciferol (VITAMIN D) 50 MCG (2000 UT) tablet Take 2,000 Units by mouth daily.   denosumab (PROLIA) 60 MG/ML SOSY injection Inject 60 mg into the skin every 6 (six) months.   ferrous sulfate 325 (65 FE) MG tablet Take 325 mg by mouth daily with breakfast.   furosemide (LASIX) 20 MG tablet Take 10-40 mg by mouth 2 (two) times daily. Take 2 tablets (40mg ) by mouth every morning and take  tablet (10mg ) by mouth at lunchtime   JALYN 0.5-0.4 MG CAPS Take 1 capsule by mouth daily.    linagliptin (TRADJENTA) 5 MG TABS tablet Take 5 mg by mouth daily.   losartan (COZAAR) 25 MG tablet Take 0.5 tablets (12.5 mg total) by mouth 2 (two) times daily.   Multiple Vitamin (MULTIVITAMIN) capsule Take 1 capsule by mouth daily.   Nutritional Supplements (FEEDING SUPPLEMENT, NEPRO CARB STEADY,) LIQD Take 237 mLs by mouth 3 (three) times daily with meals.   polyethylene glycol (MIRALAX / GLYCOLAX) 17 g packet Take 17 g by mouth daily.   potassium chloride (KLOR-CON) 10 MEQ tablet Take 10 mEq by mouth daily.   saccharomyces boulardii (FLORASTOR) 250 MG capsule Take 250 mg by mouth 2 (two) times daily.   sodium bicarbonate 650 MG tablet Take 650 mg by mouth 2 (two) times daily.   triamcinolone ointment (KENALOG) 0.1 % Apply 1 application topically 2 (two) times  daily. (apply to arms and legs)   No facility-administered encounter medications on file as of 03/24/2020.    PHYSICAL EXAM:   General: NAD, frail appearing, thin, pleasant male Cardiovascular: regular rate and rhythm Pulmonary: clear ant fields Neurological: generalized weakness, walks with walker  Johnni Wunschel Ihor Gully, NP

## 2020-04-14 ENCOUNTER — Other Ambulatory Visit: Payer: Self-pay

## 2020-04-14 ENCOUNTER — Non-Acute Institutional Stay: Payer: Medicare Other | Admitting: Adult Health Nurse Practitioner

## 2020-04-14 DIAGNOSIS — Z515 Encounter for palliative care: Secondary | ICD-10-CM

## 2020-04-14 DIAGNOSIS — N184 Chronic kidney disease, stage 4 (severe): Secondary | ICD-10-CM

## 2020-04-14 NOTE — Progress Notes (Signed)
[facility] Consult Note Telephone: [phone]  Fax: [phone]  PATIENT NAME: [name] DOB: February 04, 1932 MRN: [id number]  PRIMARY CARE PROVIDER:   [name], MD  REFERRING PROVIDER:  [name], MD [address] [facility] [place],  [place] [address]  RESPONSIBLE PARTY:   Daughter in [place] [phone]    RECOMMENDATIONS and PLAN:  1.  Advanced care planning.  Patient is DNR  2.Palliative care encounter; Palliative medicine team will continue to support patient, patient's family, and medical team. Visit consisted of counseling and education dealing with the complex and emotionally intense issues of symptom management and palliative care in the setting of serious and potentially life-threatening illness  Patient has decreasing kidney function with GFR in the teens.  Patient does not want dialysis.  He wishes not to be taken to the hospital.  Daughter in law is present during visit today.  This visit was scheduled after staff was concerned with his increased weakness and confusion this morning.  Though he is doing better now, discussed that with his worsening kidney function that he can expect to have more episodes like this.  Also discussed this with staff and to call his daughter in law before sending out to hospital as he does not want to be taken to the hospital.  Did not want to discuss hospice today.    This provider seeing this patient for another provider and regular provider will follow up in 2-4 weeks.  I spent 30 minutes providing this consultation,  from 11:15 to 11:45 including time spent with patient/family, chart review, provider coordination, documentation. More than 50% of the time in this consultation was spent coordinating communication.   HISTORY OF PRESENT ILLNESS:  [name] is a 86 y.o. year old male with multiple medical problems including Dementia, Cancer, iron deficiency anemia ,  peripheral artery disease, chronic kidney disease, diabetes, heart murmur, hypertension, hyperlipidemia, retinal detachment, polypectomy, right intramedullary nail, hernia repair, eye surgery. Palliative Care was asked to help address goals of care. Patient states that earlier this morning he felt weak and dizzy.  He did not go down to dining room for breakfast, but was able to eat breakfast brought to his room.  At this time patient denies pain, increased SOB or cough, dizziness, N/V/D, constipation, dysuria.    CODE STATUS: DNR  PPS: 40% HOSPICE ELIGIBILITY/DIAGNOSIS: TBD  PHYSICAL EXAM:  BP 132/58  HR 70  O2 98% on RA General: NAD, frail appearing Cardiovascular: regular rate and rhythm Pulmonary: lung sounds clear; normal respiratory effort Abdomen: soft, nontender, + bowel sounds Extremities: no edema, no joint deformities Skin: no rashes on exposed skin Neurological: Weakness but otherwise nonfocal; A&O x3   PAST MEDICAL HISTORY:  Past Medical History:  Diagnosis Date  . Cancer ([facility])   . Chronic kidney disease   . Colon polyp   . Diabetes mellitus without complication ([place])   . Heart murmur 2008  . Hyperlipidemia   . Hypertension 1980  . Retinal detachment     SOCIAL HX:  Social History   Tobacco Use  . Smoking status: Never Smoker  . Smokeless tobacco: Never Used  Substance Use Topics  . Alcohol use: No    ALLERGIES:  Allergies  Allergen Reactions  . Meperidine Other (See Comments)    Severe bradycardia Severe bradycardia   . Other     Darvocet-N Lowers heart rate  . Penicillins Rash     PERTINENT  MEDICATIONS:  Outpatient Encounter Medications as of 04/14/2020  Medication Sig  . acetaminophen (TYLENOL) 500 MG tablet Take 500 mg by mouth every 12 (twelve) hours as needed for mild pain.  Marland Kitchen amLODipine (NORVASC) 10 MG tablet Take 10 mg by mouth daily.   Marland Kitchen aspirin EC 81 MG EC tablet Take 1 tablet (81 mg total) by mouth daily.  Marland Kitchen B-Complex TABS Take 1 tablet  by mouth daily.  . calcitRIOL (ROCALTROL) 0.25 MCG capsule Take 0.25 mcg by mouth daily.   . calcium carbonate (OSCAL) 1500 (600 Ca) MG TABS tablet Take 600 mg of elemental calcium by mouth 2 (two) times daily.  . Cholecalciferol (VITAMIN D) 50 MCG (2000 UT) tablet Take 2,000 Units by mouth daily.  Marland Kitchen denosumab (PROLIA) 60 MG/ML SOSY injection Inject 60 mg into the skin every 6 (six) months.  . ferrous sulfate 325 (65 FE) MG tablet Take 325 mg by mouth daily with breakfast.  . furosemide (LASIX) 20 MG tablet Take 10-40 mg by mouth 2 (two) times daily. Take 2 tablets (40mg ) by mouth every morning and take  tablet (10mg ) by mouth at lunchtime  . JALYN 0.5-0.4 MG CAPS Take 1 capsule by mouth daily.   Marland Kitchen linagliptin (TRADJENTA) 5 MG TABS tablet Take 5 mg by mouth daily.  Marland Kitchen losartan (COZAAR) 25 MG tablet Take 0.5 tablets (12.5 mg total) by mouth 2 (two) times daily.  . Multiple Vitamin (MULTIVITAMIN) capsule Take 1 capsule by mouth daily.  . Nutritional Supplements (FEEDING SUPPLEMENT, NEPRO CARB STEADY,) LIQD Take 237 mLs by mouth 3 (three) times daily with meals.  . polyethylene glycol (MIRALAX / GLYCOLAX) 17 g packet Take 17 g by mouth daily.  . potassium chloride (KLOR-CON) 10 MEQ tablet Take 10 mEq by mouth daily.  Marland Kitchen saccharomyces boulardii (FLORASTOR) 250 MG capsule Take 250 mg by mouth 2 (two) times daily.  . sodium bicarbonate 650 MG tablet Take 650 mg by mouth 2 (two) times daily.  Marland Kitchen triamcinolone ointment (KENALOG) 0.1 % Apply 1 application topically 2 (two) times daily. (apply to arms and legs)   No facility-administered encounter medications on file as of 04/14/2020.     Nathanal Hermiz Jenetta Downer, NP

## 2020-04-27 ENCOUNTER — Non-Acute Institutional Stay: Payer: Medicare Other | Admitting: Nurse Practitioner

## 2020-04-27 ENCOUNTER — Other Ambulatory Visit: Payer: Self-pay

## 2020-04-27 ENCOUNTER — Encounter: Payer: Self-pay | Admitting: Nurse Practitioner

## 2020-04-27 DIAGNOSIS — N184 Chronic kidney disease, stage 4 (severe): Secondary | ICD-10-CM

## 2020-04-27 DIAGNOSIS — Z515 Encounter for palliative care: Secondary | ICD-10-CM

## 2020-04-27 NOTE — Progress Notes (Signed)
    [facility] Consult Note Telephone: [phone]  Fax: [phone]  PATIENT NAME: [name] DOB: 1932-02-05 MRN: [id number]  PRIMARY CARE PROVIDER:   [name], MD  REFERRING PROVIDER:  [name], MD [address] [facility] [place],  [place] [address]  RESPONSIBLE PARTY:Daughter in [place] [phone]  RECOMMENDATIONS and PLAN: 1.ACP:DNR, in Epic/Vynca; Hospice  2.Palliative care encounter; Palliative medicine team will continue to support patient, patient's family, and medical team. Visit consisted of counseling and education dealing with the complex and emotionally intense issues of symptom management and palliative care in the setting of serious and potentially life-threatening illness  I spent 90 minutes providing this consultation,  from 11:00am to 12:30pm. More than 50% of the time in this consultation was spent coordinating communication.   HISTORY OF PRESENT ILLNESS:  [name] is a 86 y.o. year old male with multiple medical problems including Dementia, Cancer, iron deficiency anemia , peripheral artery disease, chronic kidney disease, diabetes, heart murmur, hypertension, hyperlipidemia, retinal detachment, polypectomy, right intramedullary nail, hernia repair, eye surgery.Received call from [name], [name] daughter in law, Mr. Spiller is weak, more confused continues to decline. [name] endorses GFR now 12. We talked about a PC f/u visit. I visited and observed [name]. [name] was sitting in the chair in his room, appears weak, mild BLE edema, sleeping. Mr [name] opens his eyes to verbal cues, answers questions, denies pain. [name] was cooperative with assessment, went to sleep during. Emotional support provided. I called [name], we talked about PC visit with [name]. We talked about overall decline, debility, worsening CKD, progression, medical goc, hospice. We talked about increase in skill  level at ALF. [name] wanted to further discuss with Mr. Klem son and daughter about hospice. [name] returned call with update to proceed with hospice. Notified [name] to send order   Palliative Care was asked to help to continue to address goals of care.   CODE STATUS: DNR  PPS: 40% HOSPICE ELIGIBILITY/DIAGNOSIS: TBD  PAST MEDICAL HISTORY:  Past Medical History:  Diagnosis Date  . Cancer ([place])   . Chronic kidney disease   . Colon polyp   . Diabetes mellitus without complication ([place])   . Heart murmur 2008  . Hyperlipidemia   . Hypertension 1980  . Retinal detachment     SOCIAL HX:  Social History   Tobacco Use  . Smoking status: Never Smoker  . Smokeless tobacco: Never Used  Substance Use Topics  . Alcohol use: No    ALLERGIES:  Allergies  Allergen Reactions  . Meperidine Other (See Comments)    Severe bradycardia Severe bradycardia   . Other     Darvocet-N Lowers heart rate  . Penicillins Rash     PHYSICAL EXAM:   General: chronically ill, frail appearing, thin, pleasant male Cardiovascular: regular rate and rhythm Pulmonary: clear ant fields; crackles bases Abdomen: soft, round, + bowel sounds Extremities: mild BLE edema, no joint deformities Neurological: generalized weakness; walks with walker  Jasraj Lappe Ihor Gully, NP

## 2021-03-10 DEATH — deceased
